# Patient Record
Sex: Female | Born: 1960 | Race: White | Hispanic: No | Smoking: Former smoker
Health system: Southern US, Community
[De-identification: ages and names within clinical notes are randomized; demographics above are authoritative.]

## PROBLEM LIST (undated history)

## (undated) DIAGNOSIS — K219 Gastro-esophageal reflux disease without esophagitis: Secondary | ICD-10-CM

## (undated) DIAGNOSIS — J449 Chronic obstructive pulmonary disease, unspecified: Secondary | ICD-10-CM

## (undated) DIAGNOSIS — R002 Palpitations: Secondary | ICD-10-CM

## (undated) DIAGNOSIS — I499 Cardiac arrhythmia, unspecified: Secondary | ICD-10-CM

## (undated) DIAGNOSIS — E119 Type 2 diabetes mellitus without complications: Secondary | ICD-10-CM

## (undated) DIAGNOSIS — E785 Hyperlipidemia, unspecified: Secondary | ICD-10-CM

## (undated) DIAGNOSIS — I2510 Atherosclerotic heart disease of native coronary artery without angina pectoris: Secondary | ICD-10-CM

## (undated) DIAGNOSIS — I10 Essential (primary) hypertension: Secondary | ICD-10-CM

## (undated) DIAGNOSIS — I209 Angina pectoris, unspecified: Secondary | ICD-10-CM

## (undated) DIAGNOSIS — Z8249 Family history of ischemic heart disease and other diseases of the circulatory system: Secondary | ICD-10-CM

## (undated) DIAGNOSIS — I119 Hypertensive heart disease without heart failure: Secondary | ICD-10-CM

## (undated) DIAGNOSIS — G4730 Sleep apnea, unspecified: Secondary | ICD-10-CM

## (undated) DIAGNOSIS — IMO0001 Reserved for inherently not codable concepts without codable children: Secondary | ICD-10-CM

## (undated) DIAGNOSIS — N189 Chronic kidney disease, unspecified: Secondary | ICD-10-CM

## (undated) DIAGNOSIS — Z720 Tobacco use: Secondary | ICD-10-CM

## (undated) DIAGNOSIS — Z86010 Personal history of colonic polyps: Secondary | ICD-10-CM

## (undated) HISTORY — DX: Hypertensive heart disease without heart failure: I11.9

## (undated) HISTORY — DX: Type 2 diabetes mellitus without complications: E11.9

## (undated) HISTORY — PX: COLONOSCOPY: SHX174

## (undated) HISTORY — DX: Sleep apnea, unspecified: G47.30

## (undated) HISTORY — DX: Chronic kidney disease, unspecified: N18.9

## (undated) HISTORY — DX: Palpitations: R00.2

## (undated) HISTORY — DX: Chronic obstructive pulmonary disease, unspecified: J44.9

## (undated) HISTORY — PX: POLYPECTOMY: SHX149

## (undated) HISTORY — PX: WRIST FRACTURE SURGERY: SHX121

## (undated) HISTORY — DX: Hyperlipidemia, unspecified: E78.5

## (undated) HISTORY — DX: Personal history of colonic polyps: Z86.010

---
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
## 1999-07-23 ENCOUNTER — Encounter: Admission: RE | Admit: 1999-07-23 | Discharge: 1999-07-23 | Payer: Self-pay | Admitting: Gastroenterology

## 1999-07-23 ENCOUNTER — Encounter: Payer: Self-pay | Admitting: Gastroenterology

## 1999-07-30 ENCOUNTER — Encounter: Payer: Self-pay | Admitting: Gastroenterology

## 1999-07-30 ENCOUNTER — Encounter: Admission: RE | Admit: 1999-07-30 | Discharge: 1999-07-30 | Payer: Self-pay | Admitting: Gastroenterology

## 2000-04-18 ENCOUNTER — Encounter: Payer: Self-pay | Admitting: Gastroenterology

## 2000-04-18 ENCOUNTER — Encounter: Admission: RE | Admit: 2000-04-18 | Discharge: 2000-04-18 | Payer: Self-pay | Admitting: Gastroenterology

## 2008-01-24 ENCOUNTER — Inpatient Hospital Stay (HOSPITAL_COMMUNITY): Admission: EM | Admit: 2008-01-24 | Discharge: 2008-01-27 | Payer: Self-pay | Admitting: Emergency Medicine

## 2008-01-25 ENCOUNTER — Encounter (INDEPENDENT_AMBULATORY_CARE_PROVIDER_SITE_OTHER): Payer: Self-pay | Admitting: Internal Medicine

## 2008-01-25 HISTORY — PX: TRANSTHORACIC ECHOCARDIOGRAM: SHX275

## 2008-02-06 ENCOUNTER — Inpatient Hospital Stay (HOSPITAL_COMMUNITY): Admission: EM | Admit: 2008-02-06 | Discharge: 2008-02-08 | Payer: Self-pay | Admitting: Emergency Medicine

## 2008-02-07 HISTORY — PX: CARDIAC CATHETERIZATION: SHX172

## 2008-05-03 ENCOUNTER — Encounter: Admission: RE | Admit: 2008-05-03 | Discharge: 2008-05-03 | Payer: Self-pay | Admitting: Emergency Medicine

## 2009-06-05 HISTORY — PX: OTHER SURGICAL HISTORY: SHX169

## 2010-04-27 LAB — DIFFERENTIAL
Basophils Absolute: 0 10*3/uL (ref 0.0–0.1)
Basophils Absolute: 0 10*3/uL (ref 0.0–0.1)
Basophils Relative: 1 % (ref 0–1)
Basophils Relative: 1 % (ref 0–1)
Eosinophils Absolute: 0.4 10*3/uL (ref 0.0–0.7)
Eosinophils Absolute: 0.2 10*3/uL (ref 0.0–0.7)
Monocytes Absolute: 0.5 10*3/uL (ref 0.1–1.0)
Monocytes Relative: 6 % (ref 3–12)
Neutro Abs: 4.6 10*3/uL (ref 1.7–7.7)
Neutro Abs: 5.2 10*3/uL (ref 1.7–7.7)
Neutrophils Relative %: 64 % (ref 43–77)

## 2010-04-27 LAB — CBC
HCT: 36.7 % (ref 36.0–46.0)
HCT: 34.2 % — ABNORMAL LOW (ref 36.0–46.0)
HCT: 37.3 % (ref 36.0–46.0)
Hemoglobin: 12.1 g/dL (ref 12.0–15.0)
Hemoglobin: 12.7 g/dL (ref 12.0–15.0)
MCHC: 32.3 g/dL (ref 30.0–36.0)
MCHC: 32.9 g/dL (ref 30.0–36.0)
MCHC: 32.3 g/dL (ref 30.0–36.0)
MCHC: 32.4 g/dL (ref 30.0–36.0)
MCHC: 33.6 g/dL (ref 30.0–36.0)
MCV: 81.8 fL (ref 78.0–100.0)
MCV: 83.2 fL (ref 78.0–100.0)
MCV: 81.2 fL (ref 78.0–100.0)
MCV: 83.1 fL (ref 78.0–100.0)
Platelets: 230 10*3/uL (ref 150–400)
Platelets: 196 10*3/uL (ref 150–400)
Platelets: 209 10*3/uL (ref 150–400)
RBC: 4.48 MIL/uL (ref 3.87–5.11)
RBC: 4.5 MIL/uL (ref 3.87–5.11)
RBC: 4.74 MIL/uL (ref 3.87–5.11)
RDW: 14.3 % (ref 11.5–15.5)
RDW: 14.4 % (ref 11.5–15.5)
RDW: 14.8 % (ref 11.5–15.5)
WBC: 8 10*3/uL (ref 4.0–10.5)
WBC: 8.5 10*3/uL (ref 4.0–10.5)

## 2010-04-27 LAB — BASIC METABOLIC PANEL
BUN: 18 mg/dL (ref 6–23)
BUN: 23 mg/dL (ref 6–23)
BUN: 33 mg/dL — ABNORMAL HIGH (ref 6–23)
CO2: 24 mEq/L (ref 19–32)
Calcium: 8.8 mg/dL (ref 8.4–10.5)
Chloride: 108 mEq/L (ref 96–112)
Creatinine, Ser: 0.93 mg/dL (ref 0.4–1.2)
GFR calc Af Amer: >60 mL/min (ref >60)
GFR calc non Af Amer: 41 mL/min — ABNORMAL LOW (ref >60)
GFR calc non Af Amer: 50 mL/min — ABNORMAL LOW (ref >60)
Glucose, Bld: 87 mg/dL (ref 70–99)
Potassium: 3.8 mEq/L (ref 3.5–5.1)
Potassium: 4 mEq/L (ref 3.5–5.1)
Sodium: 137 mEq/L (ref 135–145)

## 2010-04-27 LAB — COMPREHENSIVE METABOLIC PANEL
ALT: 21 U/L (ref 0–35)
ALT: 12 U/L (ref 0–35)
Alkaline Phosphatase: 42 U/L (ref 39–117)
Alkaline Phosphatase: 39 U/L (ref 39–117)
BUN: 18 mg/dL (ref 6–23)
BUN: 28 mg/dL — ABNORMAL HIGH (ref 6–23)
CO2: 25 mEq/L (ref 19–32)
CO2: 24 mEq/L (ref 19–32)
Chloride: 103 mEq/L (ref 96–112)
Chloride: 106 mEq/L (ref 96–112)
Creatinine, Ser: 0.93 mg/dL (ref 0.4–1.2)
GFR calc Af Amer: >60 mL/min (ref >60)
GFR calc non Af Amer: 47 mL/min — ABNORMAL LOW (ref >60)
Glucose, Bld: 109 mg/dL — ABNORMAL HIGH (ref 70–99)
Glucose, Bld: 101 mg/dL — ABNORMAL HIGH (ref 70–99)
Potassium: 4 mEq/L (ref 3.5–5.1)
Sodium: 139 mEq/L (ref 135–145)
Total Bilirubin: 0.6 mg/dL (ref 0.3–1.2)

## 2010-04-27 LAB — HEPATIC FUNCTION PANEL
ALT: 21 U/L (ref 0–35)
AST: 20 U/L (ref 0–37)
Albumin: 3.1 g/dL — ABNORMAL LOW (ref 3.5–5.2)
Bilirubin, Direct: <0.1 mg/dL (ref 0.0–0.3)

## 2010-04-27 LAB — HEMOGLOBIN A1C: Mean Plasma Glucose: 117 mg/dL

## 2010-04-27 LAB — RAPID URINE DRUG SCREEN, HOSP PERFORMED
Amphetamines: NOT DETECTED
Benzodiazepines: NOT DETECTED
Tetrahydrocannabinol: NOT DETECTED

## 2010-04-27 LAB — CK TOTAL AND CKMB (NOT AT ARMC)
CK, MB: 1.6 ng/mL (ref 0.3–4.0)
CK, MB: 2 ng/mL (ref 0.3–4.0)
CK, MB: 1.2 ng/mL (ref 0.3–4.0)
Relative Index: 1.4 (ref 0.0–2.5)
Total CK: 115 U/L (ref 7–177)
Total CK: 135 U/L (ref 7–177)

## 2010-04-27 LAB — SEDIMENTATION RATE: Sed Rate: 8 mm/hr (ref 0–22)

## 2010-04-27 LAB — LIPID PANEL
HDL: 22 mg/dL — ABNORMAL LOW (ref >39)
LDL Cholesterol: 100 mg/dL — ABNORMAL HIGH (ref 0–99)
Total CHOL/HDL Ratio: 7.7 RATIO
VLDL: 47 mg/dL — ABNORMAL HIGH (ref 0–40)

## 2010-04-27 LAB — TSH: TSH: 1.636 u[IU]/mL (ref 0.350–4.500)

## 2010-04-27 LAB — CARDIAC PANEL(CRET KIN+CKTOT+MB+TROPI)
CK, MB: 1 ng/mL (ref 0.3–4.0)
Total CK: 102 U/L (ref 7–177)
Total CK: 88 U/L (ref 7–177)
Total CK: 93 U/L (ref 7–177)
Troponin I: <0.01 ng/mL (ref 0.00–0.06)
Troponin I: <0.01 ng/mL (ref 0.00–0.06)

## 2010-04-27 LAB — MAGNESIUM: Magnesium: 2.5 mg/dL (ref 1.5–2.5)

## 2010-04-27 LAB — TROPONIN I: Troponin I: <0.01 ng/mL (ref 0.00–0.06)

## 2010-05-26 NOTE — Discharge Summary (Signed)
[name], [name]             ACCOUNT NO.:  [id number]   MEDICAL RECORD NO.:  [id number]          PATIENT TYPE:  INP   LOCATION:  4729                         FACILITY:  [facility]   PHYSICIAN:  Durwin Nora, MDDATE OF BIRTH:  1960-09-24   DATE OF ADMISSION:  01/24/2008  DATE OF DISCHARGE:  01/27/2008                               DISCHARGE SUMMARY   DISCHARGE DIAGNOSES:  1. Chest pain atypical acute coronary syndrome less likely.  2. Small benign granulomas, right lung.  3. Renal cyst.  4. Severe hypertension, improved.  5. Morbid obesity.  6. Dyslipidemia.  7. Tobacco abuse.   RADIOLOGY:  The CT chest on January 25, 2008 shows a benign appearing  chest, which incidentally shows renal cyst of both kidneys.  Chest x-ray  on January 24, 2008 showed no acute cardiopulmonary findings, small  right upper lobe pulmonary nodule.   HOSPITAL COURSE:  This 50 year old lady presented with substernal chest  pressure and elevated blood pressure.  Systolic blood pressure was  greater than 200 at presentation.  EKG did show some T-wave  abnormalities however, serial cardiac enzymes on EKG have been negative  on this admission.  Lipid panel did show hypercholesterolemia with  triclyceride level 237, HDL level 22 and LDL level of 100.  The patient  has been started on statins.  LFTs are seen normal.  The blood pressure  control has been improved on ACE inhibitor, beta-blocker and  continuation of a diuretic.   DISCHARGE CONDITION:  Stable.   DIET:  Heart-healthy, low-cholesterol.   ACTIVITY:  Activity increased slowly as tolerated.   FOLLOWUP:  The patient to report back to the emergency if she develops  any sudden chest pain, shortness of breath, excessive weakness or  dizziness.   DISCHARGE MEDICATIONS:  1. Maxzide 25 mg daily.  2. Enteric coated aspirin 81 mg daily.  3. Amlodipine 10 mg daily.  4. Lisinopril 10 mg daily.  5. The patient is to hold blood pressure  medication if blood pressure      is less than systolic [id number], diastolic 60.  6. Metoprolol 50 mg twice daily, hold if heart rate is less than 60.  7. Nitroglycerin 0.4 mg sublingual every 5 minutes x3 as need for      chest pain.  8. Nicotine patch 21 mg per day.   PHYSICAL EXAMINATION:  GENERAL:  On examination today, she is an elderly  lady, not in acute distress.  VITAL SIGNS:  Temperature 97, pulse 82, respiratory rate is 20, and  blood pressure 150/90.  HEENT:  Head is atraumatic, normocephalic.  Oropharynx and nasopharynx  are clear.  Mucous membranes are moist.  NECK:  Supple.  There is no submandibular lymphadenopathy.  No carotid  bruit or thyromegaly.  CHEST:  Clinically clear.  HEART:  Heart sounds 1 and 2, regular.  No rubs or murmurs.  ABDOMEN:  Benign.  CENTRAL NERVOUS SYSTEM:  No deficits.  Peripheral pulses present.  No  pedal edema.   LABORATORY DATA:  Chemistry:  Sodium is 136, potassium 48.5, chloride  103, bicarbonate 25, glucose 109, BUN 16, and creatinine  0.9.  Complete  blood count shows WBC is 8.5, hematocrit is 36, platelet count is 230.  A 2-D echocardiogram on January 25, 2008 shows a normal left ventricular  ejection fraction ranging between 60-65%.  There was no left ventricular  regional wall motion abnormalities.  The left ventricular wall thickness  was mild to moderately increased in the concentric hypertrophy.  Features were consistent with this pseudomonal left ventricular film  pattern with concomitant abnormal relaxation and increased film  pressure.  Left atrium was also mildly dilated.   The patient's illness, medication, test, treatment, and discharge plan  were explained to her.  Tobacco cessation was reinforced.  Discharge  time greater than 30 minutes.   She is to follow up with the primary care physician Dr. Joseph Art at  the Urgent Care in the next 5-7 days.  He will arrange Cardiology  followup and possible stress echo as outpatient  as needed.      Durwin Nora, MD  Electronically Signed     MIO/MEDQ  D:  01/27/2008  T:  01/27/2008  Job:  [id number]

## 2010-05-26 NOTE — Discharge Summary (Signed)
[name]             ACCOUNT NO.:  [id number]   MEDICAL RECORD NO.:  [id number]          PATIENT TYPE:  INP   LOCATION:  [id number]                         FACILITY:  [facility]   PHYSICIAN:  Tery Sanfilippo, MD     DATE OF BIRTH:  Jan 30, 1960   DATE OF ADMISSION:  02/06/2008  DATE OF DISCHARGE:  02/08/2008                               DISCHARGE SUMMARY   DISCHARGE DIAGNOSES:  1. Chest pain worrisome for unstable angina, catheterization this      admission revealing moderate left anterior descending disease with      a 60-70% mid lesion, 50% mid circumflex and 50% right coronary      artery and 70% distal right coronary artery, plan is for medical      therapy.  2. Treated hypertension.  3. Treated dyslipidemia.  4. History of smoking.   HOSPITAL COURSE:  The patient is a 50 year old female who was sent to  [facility] ER from an Urgent Care with chest pain.  She has multiple risk  factors for coronary artery disease.  Her symptoms were worrisome for  unstable angina.  She was seen by Dr. Elisabeth Cara on admission.  Please see  admission history and physical for complete details.  She did have an  abnormal EKG with bradycardia and nonspecific ST changes.  The patient  was admitted to telemetry and set up for diagnostic catheterization.  This revealed moderate disease as described above.  Plan is for  continued medical therapy.  She will need an outpatient Myoview which  has been scheduled for February 19, 2008 at 8:15.  Dr. Elisabeth Cara feels she  can go back to work on February 12, 2008.   MEDICATIONS AT DISCHARGE:  1. Maxzide 12.5 mg a day.  2. Aspirin 81 mg a day.  3. Amlodipine 10 mg a day.  4. Lisinopril 20 mg at bedtime.  5. Metoprolol 25 mg b.i.d.  6. Nitroglycerin sublingual p.r.n.  7. Zocor 40 mg a day.   LABORATORY DATA:  White count 9.1, hemoglobin 11.5, hematocrit 34.2,  platelets 196.  Sodium 139, potassium 4.0, BUN 23, creatinine 1.16,  troponins are negative.  TSH is 1.63.   Lipids are pending.   DISPOSITION:  The patient is discharged in stable condition and will  follow up with Dr. Elisabeth Cara after her Myoview.      Erlene Quan, P.A.      Tery Sanfilippo, MD  Electronically Signed    LKK/MEDQ  D:  02/08/2008  T:  02/08/2008  Job:  [id number]

## 2010-05-26 NOTE — H&P (Signed)
[name], [name]             ACCOUNT NO.:  [id number]   MEDICAL RECORD NO.:  [id number]          PATIENT TYPE:  EMS   LOCATION:  MAJO                         FACILITY:  [place]   PHYSICIAN:  Rise Patience, MDDATE OF BIRTH:  Apr 07, 1960   DATE OF ADMISSION:  01/24/2008  DATE OF DISCHARGE:                              HISTORY & PHYSICAL   PRIMARY CARE PHYSICIAN:  Dr. Joseph Art.   CHIEF COMPLAINT:  Chest pain, elevated blood pressure.   HISTORY OF PRESENT ILLNESS:  A 50 year old female with a history of  hypertension checked her blood pressure in [employer] and outpatient  clinic, where she was found to a blood pressure of more than 200.  In  addition, the patient also had mild chest discomfort which happened  twice today, which lasted for a few seconds, pinprick sensation,  retrosternal, non-radiating, not related to exertion.  Denies any  associated shortness of breath, abdominal pain, nausea, vomiting,  palpitations, dizziness, weakness of limbs of diaphoresis.  The patient  in the ER was found to have a high blood pressure and has been admitted.  The patient's EKG showed some lateral wall changes.  For further  observation and rule out ACS and control her blood pressure.  The  patient presented to the ED asymptomatic.   PAST MEDICAL HISTORY:  Hypertension.   PAST SURGICAL HISTORY:  Right wrist surgery for fracture.   MEDICATIONS PRIOR TO ADMISSION:  Maxzide 25 mg p.o. daily.   ALLERGIES:  NO KNOWN DRUG ALLERGIES.   SOCIAL HISTORY:  The patient smokes cigarettes and has been strongly  encouraged to quit smoking.  Denies alcohol or drug abuse.   FAMILY HISTORY:  Significant for patient's dying of MI at age 6.   REVIEW OF SYSTEMS:  As per the history of present illness, nothing else  significant.   PHYSICAL EXAMINATION:  GENERAL:  Patient examined at bedside, not in  acute distress.  Denies any chest pain now.  VITAL SIGNS:  Blood pressure 180/90, pulse 70 per  minute, temperature  98.6, respirations 18 per minute.  O2 sat 100%.  HEENT:  Anicteric, no pallor.  CHEST:  Bilateral air entry present.  No rhonchi, no crepitation.  HEART:  S1-S2 heard.  ABDOMEN:  Soft, nontender.  Bowel sounds heard.  CNS:  Alert, awake, oriented to time, place and person.  Moves upper and  lower extremities 5/5.  EXTREMITIES:  Peripheral pulses felt.  No edema.   LABORATORY DATA:  EKG:  Normal sinus rhythm with T-wave changes in the  lateral leads.  Chest x-ray:  Shows no acute cardiopulmonary findings.  Small right upper pulmonary nodule.  CBC - WBC 8, hemoglobin 12.1,  hematocrit 37.4, platelets 230, neutrophils 57%.  Basic metabolic panel;  sodium [id number], potassium 3.9, chloride 108, carbon dioxide 26, glucose 91,  BUN 18, creatinine 0.8, calcium 8.8, CK 135, troponin-I 0.01.  BNP 32.  Drug screen negative.   ASSESSMENT:  1. Atypical chest pain, to rule out acute coronary syndrome.  2. Accelerated hypertension.  3. Pulmonary nodule.  4. Ongoing tobacco abuse.   PLAN:  Admit patient to telemetry.  Cycle cardiac markers.  Will add  Norvasc and beta-blockers.  Place patient on aspirin and will get a 2-D  echo.  Further recommendations as condition evolves.  The patient  advised that she will need further workup on her pulmonary nodule,  including an outpatient CAT scan and further follow up with chest x-ray  through her primary care physician.      Rise Patience, MD  Electronically Signed     ANK/MEDQ  D:  01/24/2008  T:  01/24/2008  Job:  [phone]

## 2011-03-18 ENCOUNTER — Other Ambulatory Visit (HOSPITAL_COMMUNITY): Payer: Self-pay | Admitting: Cardiology

## 2011-03-18 DIAGNOSIS — I2510 Atherosclerotic heart disease of native coronary artery without angina pectoris: Secondary | ICD-10-CM

## 2011-03-18 DIAGNOSIS — I10 Essential (primary) hypertension: Secondary | ICD-10-CM

## 2011-03-25 ENCOUNTER — Ambulatory Visit (HOSPITAL_COMMUNITY)
Admission: RE | Admit: 2011-03-25 | Discharge: 2011-03-25 | Disposition: A | Discharge status: Home / Self Care | Payer: Self-pay | Source: Ambulatory Visit | Attending: Cardiology | Admitting: Cardiology

## 2011-03-25 ENCOUNTER — Encounter (HOSPITAL_COMMUNITY)
Admission: RE | Admit: 2011-03-25 | Discharge: 2011-03-25 | Disposition: A | Discharge status: Home / Self Care | Payer: Self-pay | Source: Ambulatory Visit | Attending: Cardiology | Admitting: Cardiology

## 2011-03-25 DIAGNOSIS — R079 Chest pain, unspecified: Secondary | ICD-10-CM | POA: Insufficient documentation

## 2011-03-25 DIAGNOSIS — I10 Essential (primary) hypertension: Secondary | ICD-10-CM | POA: Insufficient documentation

## 2011-03-25 DIAGNOSIS — I2510 Atherosclerotic heart disease of native coronary artery without angina pectoris: Secondary | ICD-10-CM | POA: Insufficient documentation

## 2011-03-25 HISTORY — PX: CARDIOVASCULAR STRESS TEST: SHX262

## 2011-03-25 MED ORDER — REGADENOSON 0.4 MG/5ML IV SOLN
INTRAVENOUS | Status: AC
  Administered 2011-03-25: 0.4 mg
  Filled 2011-03-25: qty 5

## 2011-03-25 MED ORDER — TECHNETIUM TC 99M TETROFOSMIN IV KIT
30.0000 | PACK | Freq: Once | INTRAVENOUS | Status: AC | PRN
  Administered 2011-03-25: 30 via INTRAVENOUS

## 2011-03-25 MED ORDER — TECHNETIUM TC 99M TETROFOSMIN IV KIT
10.0000 | PACK | Freq: Once | INTRAVENOUS | Status: AC | PRN
  Administered 2011-03-25: 10 via INTRAVENOUS

## 2011-04-15 ENCOUNTER — Observation Stay (HOSPITAL_COMMUNITY)
Admission: AD | Admit: 2011-04-15 | Discharge: 2011-04-17 | Disposition: A | Discharge status: Home / Self Care | Payer: Medicaid Other | Source: Ambulatory Visit | Attending: Cardiovascular Disease | Admitting: Cardiovascular Disease

## 2011-04-15 ENCOUNTER — Encounter (HOSPITAL_COMMUNITY): Payer: Self-pay | Admitting: *Deleted

## 2011-04-15 ENCOUNTER — Other Ambulatory Visit: Payer: Self-pay

## 2011-04-15 DIAGNOSIS — Z720 Tobacco use: Secondary | ICD-10-CM | POA: Diagnosis present

## 2011-04-15 DIAGNOSIS — E785 Hyperlipidemia, unspecified: Secondary | ICD-10-CM | POA: Diagnosis present

## 2011-04-15 DIAGNOSIS — I2510 Atherosclerotic heart disease of native coronary artery without angina pectoris: Secondary | ICD-10-CM | POA: Insufficient documentation

## 2011-04-15 DIAGNOSIS — R079 Chest pain, unspecified: Principal | ICD-10-CM | POA: Insufficient documentation

## 2011-04-15 DIAGNOSIS — I10 Essential (primary) hypertension: Secondary | ICD-10-CM | POA: Diagnosis present

## 2011-04-15 DIAGNOSIS — Z8249 Family history of ischemic heart disease and other diseases of the circulatory system: Secondary | ICD-10-CM

## 2011-04-15 DIAGNOSIS — E8881 Metabolic syndrome: Secondary | ICD-10-CM | POA: Diagnosis present

## 2011-04-15 DIAGNOSIS — R9439 Abnormal result of other cardiovascular function study: Secondary | ICD-10-CM | POA: Diagnosis present

## 2011-04-15 HISTORY — DX: Essential (primary) hypertension: I10

## 2011-04-15 HISTORY — DX: Tobacco use: Z72.0

## 2011-04-15 HISTORY — DX: Angina pectoris, unspecified: I20.9

## 2011-04-15 HISTORY — DX: Family history of ischemic heart disease and other diseases of the circulatory system: Z82.49

## 2011-04-15 HISTORY — DX: Atherosclerotic heart disease of native coronary artery without angina pectoris: I25.10

## 2011-04-15 LAB — CBC
HCT: 38.9 % (ref 36.0–46.0)
Hemoglobin: 12.8 g/dL (ref 12.0–15.0)
RBC: 4.74 MIL/uL (ref 3.87–5.11)
WBC: 9.9 10*3/uL (ref 4.0–10.5)

## 2011-04-15 LAB — HEMOGLOBIN A1C
Hgb A1c MFr Bld: 6.1 % — ABNORMAL HIGH (ref <5.7)
Mean Plasma Glucose: 128 mg/dL — ABNORMAL HIGH (ref <117)

## 2011-04-15 LAB — BASIC METABOLIC PANEL
GFR calc Af Amer: 78 mL/min — ABNORMAL LOW (ref >90)
GFR calc non Af Amer: 67 mL/min — ABNORMAL LOW (ref >90)
Glucose, Bld: 135 mg/dL — ABNORMAL HIGH (ref 70–99)
Potassium: 3.9 mEq/L (ref 3.5–5.1)
Sodium: 136 mEq/L (ref 135–145)

## 2011-04-15 LAB — CARDIAC PANEL(CRET KIN+CKTOT+MB+TROPI)
Relative Index: INVALID (ref 0.0–2.5)
Total CK: 99 U/L (ref 7–177)

## 2011-04-15 LAB — PROTIME-INR: Prothrombin Time: 13.9 seconds (ref 11.6–15.2)

## 2011-04-15 LAB — TSH: TSH: 1.405 u[IU]/mL (ref 0.350–4.500)

## 2011-04-15 MED ORDER — HEPARIN BOLUS VIA INFUSION
4000.0000 [IU] | Freq: Once | INTRAVENOUS | Status: AC
  Administered 2011-04-15: 4000 [IU] via INTRAVENOUS
  Filled 2011-04-15: qty 4000

## 2011-04-15 MED ORDER — ASPIRIN 81 MG PO CHEW: 324.0000 mg | CHEWABLE_TABLET | ORAL | Status: AC

## 2011-04-15 MED ORDER — ASPIRIN 300 MG RE SUPP
300.0000 mg | RECTAL | Status: AC
  Filled 2011-04-15: qty 1

## 2011-04-15 MED ORDER — SODIUM CHLORIDE 0.9 % IJ SOLN: 3.0000 mL | INTRAMUSCULAR | Status: DC | PRN

## 2011-04-15 MED ORDER — SODIUM CHLORIDE 0.9 % IJ SOLN
3.0000 mL | Freq: Two times a day (BID) | INTRAMUSCULAR | Status: DC
  Administered 2011-04-16: 3 mL via INTRAVENOUS

## 2011-04-15 MED ORDER — SODIUM CHLORIDE 0.9 % IV SOLN
250.0000 mL | INTRAVENOUS | Status: DC | PRN
  Administered 2011-04-15: 250 mL via INTRAVENOUS

## 2011-04-15 MED ORDER — ACETAMINOPHEN 325 MG PO TABS: 650.0000 mg | ORAL_TABLET | ORAL | Status: DC | PRN

## 2011-04-15 MED ORDER — AMLODIPINE BESYLATE 10 MG PO TABS
10.0000 mg | ORAL_TABLET | Freq: Every day | ORAL | Status: DC
  Administered 2011-04-16: 10 mg via ORAL
  Filled 2011-04-15 (×2): qty 1

## 2011-04-15 MED ORDER — LISINOPRIL 20 MG PO TABS
20.0000 mg | ORAL_TABLET | Freq: Two times a day (BID) | ORAL | Status: DC
  Administered 2011-04-15 – 2011-04-17 (×4): 20 mg via ORAL
  Filled 2011-04-15 (×5): qty 1

## 2011-04-15 MED ORDER — ASPIRIN 81 MG PO CHEW
324.0000 mg | CHEWABLE_TABLET | ORAL | Status: AC
  Administered 2011-04-16: 324 mg via ORAL
  Filled 2011-04-15: qty 4

## 2011-04-15 MED ORDER — NITROGLYCERIN IN D5W 200-5 MCG/ML-% IV SOLN
5.0000 ug/min | INTRAVENOUS | Status: DC
  Administered 2011-04-15: 5 ug/min via INTRAVENOUS
  Filled 2011-04-15: qty 250

## 2011-04-15 MED ORDER — SODIUM CHLORIDE 0.9 % IV SOLN
INTRAVENOUS | Status: DC
  Administered 2011-04-15: 21:00:00 via INTRAVENOUS

## 2011-04-15 MED ORDER — ASPIRIN EC 81 MG PO TBEC
81.0000 mg | DELAYED_RELEASE_TABLET | Freq: Every day | ORAL | Status: DC
  Administered 2011-04-17: 81 mg via ORAL
  Filled 2011-04-15: qty 1

## 2011-04-15 MED ORDER — NEBIVOLOL HCL 10 MG PO TABS
10.0000 mg | ORAL_TABLET | Freq: Two times a day (BID) | ORAL | Status: DC
  Administered 2011-04-15 – 2011-04-17 (×4): 10 mg via ORAL
  Filled 2011-04-15 (×5): qty 1

## 2011-04-15 MED ORDER — ATORVASTATIN CALCIUM 10 MG PO TABS
10.0000 mg | ORAL_TABLET | Freq: Every day | ORAL | Status: DC
  Administered 2011-04-15: 10 mg via ORAL
  Filled 2011-04-15 (×2): qty 1

## 2011-04-15 MED ORDER — ONDANSETRON HCL 4 MG/2ML IJ SOLN: 4.0000 mg | Freq: Four times a day (QID) | INTRAMUSCULAR | Status: DC | PRN

## 2011-04-15 MED ORDER — HEPARIN (PORCINE) IN NACL 100-0.45 UNIT/ML-% IJ SOLN
1300.0000 [IU]/h | INTRAMUSCULAR | Status: DC
  Administered 2011-04-15: 1000 [IU]/h via INTRAVENOUS
  Filled 2011-04-15 (×2): qty 250

## 2011-04-15 MED ORDER — NITROGLYCERIN 0.4 MG SL SUBL: 0.4000 mg | SUBLINGUAL_TABLET | SUBLINGUAL | Status: DC | PRN

## 2011-04-15 NOTE — Progress Notes (Signed)
Patient refused having bed alarm placed. Reviewed safety plan and patient understood. [name]

## 2011-04-15 NOTE — Progress Notes (Addendum)
ANTICOAGULATION CONSULT NOTE - Initial Consult  Pharmacy Consult for Heparin Indication: chest pain/ACS  Allergies  Allergen Reactions  . Wellbutrin (Bupropion Hcl)     unknown    Patient Measurements: Height: 5\' 4"  (162.6 cm) Weight: 212 lb (96.163 kg) IBW/kg (Calculated) : 54.7  Heparin Dosing Weight: 78 kg  Vital Signs: Temp: 96.9 F (36.1 C) (04/04 1645) Temp src: Oral (04/04 1645) BP: 168/81 mmHg (04/04 1645) Pulse Rate: 64  (04/04 1645)  Labs: No results found for this basename: HGB:2,HCT:3,PLT:3,APTT:3,LABPROT:3,INR:3,HEPARINUNFRC:3,CREATININE:3,CKTOTAL:3,CKMB:3,TROPONINI:3 in the last 72 hours Estimated Creatinine Clearance: 65.6 ml/min (by C-G formula based on Cr of 1.18).  Medical History: Past Medical History  Diagnosis Date  . Angina   . Hypertension     Medications:  Scheduled:    . amLODipine  10 mg Oral QHS  . aspirin  324 mg Oral NOW   Or  . aspirin  300 mg Rectal NOW  . aspirin  324 mg Oral Pre-Cath  . aspirin EC  81 mg Oral Daily  . atorvastatin  10 mg Oral q1800  . nebivolol  10 mg Oral BID  . sodium chloride  3 mL Intravenous Q12H    Assessment: Pt was directly admitted from MD office today for cardiac cath on 04/16/11.  No hx bleeding noted. Pltc 204 at baseline.   Goal of Therapy:  Heparin level 0.3-0.7 units/ml   Plan:  1) Heparin 4000 units IV bolus x1, then heparin infusion at 1000 units/hr.   2) Check 6hr heparin level. 3) daily heparin level & CBC while on heparin. Routine platelet monitoring per protocol.  Biagio Borg 04/15/2011,5:22 PM

## 2011-04-15 NOTE — H&P (Signed)
[name] is an 51 y.o. female.   Chief Complaint: chest pain HPI: 51 year old white female with history of cardiac cath by Dr. Elisabeth Cara in 2010 with 60% LAD stenosis, 50 % RCA stenosis mid vessel and 70% distal RCA, with normal LV function.  She was seen in the office 03/18/2011 for chest pain that occurred mostly at rest. She was hypertensive and medications were adjusted and she was scheduled for a lexiscan myoview.  The myoview was suspicious for ant. Ischemia.  She presented today for follow up of results.  She complained of chest pressure 7/10, associated with nausea and SOB.  EKG with deeper t-wave inversion I, and AVL.  1 NTG was given with resolution of chest pain.  Reviewed EKG and myoview results with Dr. Sallyanne Kuster, it was felt she should be admitted to tele and undergo cardiac cath in the AM.  Pt agreeable.  We offered to call EMS for pt, and we attempted to find her family to drive her to the hospital, but with resolution of her symptoms she preferred to drive herself.        Past Medical History  Diagnosis Date  . Angina   . Hypertension   . Unstable angina 04/15/2011  . CAD (coronary artery disease), non obstructive CAD in 2010 04/15/2011  . Abnormal stress test, Lexiscan myoview 03/25/2011-sm. anterior ischemia 04/15/2011  . HTN (hypertension) 04/15/2011  . Dyslipidemia 04/15/2011  . Metabolic syndrome 99991111  . Tobacco abuse 04/15/2011  . Family history of premature CAD 04/15/2011    Past Surgical History  Procedure Date  . Appendectomy   . Cardiac catheterization 2010  . Appendectomy     Family History  Problem Relation Age of Onset  . Stroke Father   . Coronary artery disease Brother    Social History:  reports that she has been smoking Cigarettes.  She has a 30 pack-year smoking history. She does not have any smokeless tobacco history on file. She reports that she does not drink alcohol or use illicit drugs. Divorced, one 67 year old child.  She is interested in stopping  tobacco.    Allergies:  Allergies  Allergen Reactions  . Simvastatin     myalgias  . Wellbutrin (Bupropion Hcl)     unknown  . Zetia (Ezetimibe)     myalgias    Medications Prior to Admission  Medication Dose Route Frequency Provider Last Rate Last Dose  . 0.9 %  sodium chloride infusion  250 mL Intravenous PRN Brett Canales, PA      . 0.9 %  sodium chloride infusion   Intravenous Continuous Brett Canales, PA      . acetaminophen (TYLENOL) tablet 650 mg  650 mg Oral Q4H PRN Brett Canales, PA      . amLODipine (NORVASC) tablet 10 mg  10 mg Oral QHS Brett Canales, PA      . aspirin chewable tablet 324 mg  324 mg Oral NOW Brett Canales, PA       Or  . aspirin suppository 300 mg  300 mg Rectal NOW Brett Canales, PA      . aspirin chewable tablet 324 mg  324 mg Oral Pre-Cath Brett Canales, [place]      . aspirin EC tablet 81 mg  81 mg Oral Daily Brett Canales, [place]      . atorvastatin (LIPITOR) tablet 10 mg  10 mg Oral q1800 Brett Canales, PA      .  heparin ADULT infusion 100 units/mL (25000 units/250 mL)  1,000 Units/hr Intravenous Continuous Lavonia Drafts [place], PHARMD      . heparin bolus via infusion 4,000 Units  4,000 Units Intravenous Once Lavonia Drafts [place], PHARMD      . nebivolol (BYSTOLIC) tablet 10 mg  10 mg Oral BID Brett Canales, PA      . nitroGLYCERIN (NITROSTAT) SL tablet 0.4 mg  0.4 mg Sublingual Q5 Min x 3 PRN Brett Canales, PA      . nitroGLYCERIN 0.2 mg/mL in dextrose 5 % infusion  5-30 mcg/min Intravenous Titrated Brett Canales, PA      . ondansetron [facility]) injection 4 mg  4 mg Intravenous Q6H PRN Brett Canales, PA      . sodium chloride 0.9 % injection 3 mL  3 mL Intravenous Q12H Brett Canales, PA      . sodium chloride 0.9 % injection 3 mL  3 mL Intravenous PRN Brett Canales, PA       Medications Prior to Admission  Medication Sig Dispense Refill  . amLODipine (NORVASC) 10 MG tablet Take 10 mg by mouth at bedtime.      . nebivolol (BYSTOLIC) 10 MG tablet  Take 10 mg by mouth 2 (two) times daily.      . rosuvastatin (CRESTOR) 5 MG tablet Take 5 mg by mouth daily. Takes on Mon, Wed and fridays      we also have the patient taking maxide 37.5/25 1 daily Lisinopril 20 mg BID ASA 81 mg daily     ROS: General:no colds or fevers Skin:no rashes or ulcers HEENT:no visual changes [id number] pain PUL:occ. SOB with chest pain GI:no diarrhea or constipation GU:no hematuria MS:occ. Lt arm pain Neuro:no syncope, occ. dizziness Endo:metabolic syndrome with [id number] of 6.0    Blood pressure 168/81, pulse 64, temperature 96.9 F (36.1 C), temperature source Oral, resp. rate 16, height 5\' 4"  (1.626 m), weight 96.163 kg (212 lb), last menstrual period 02/26/2011, SpO2 98.00%. PE: General:alert and oriented X 3, pleasant affect, distress with chest pain but once resolved pt was in NAD Skin:w&d, brisk capillary refill. HEENT:normocephalic, sclera clear  Neck:supple, no JVD Heart:S1S2, RRR, no murmur, gallup rub or click Lungs:clear without rales, rhonchi or wheezes Abd:+ BS, soft, non tender Ext:no edema Neuro:alert and oriented X 3 MAE, follows commands.    Assessment/Plan Patient Active Problem List  Diagnoses  . Unstable angina  . CAD (coronary artery disease), non obstructive CAD in 2010  . Abnormal stress test, Lexiscan myoview 03/25/2011-sm. anterior ischemia  . HTN (hypertension)  . Dyslipidemia  . Metabolic syndrome  . Tobacco abuse  . Family history of premature CAD   PLAN: Admit to tele, IV NTG, IV heparin and cardiac cath in am.    INGOLD,LAURA R 04/15/2011, 5:53 PM  Agree with note written by Cecilie Kicks RNP  Pt seen in the office and sent to [facility] for admission for [place]. + CRF. Mod CAD by cath 2010. Recent minimally + myoview several weeks ago and accelerated angina. Currently pain free on iv hep and ntg. Exam behign. Labs OK. Ekg with lat TWI. Plan cardiac cath tomorrow. Pt agreeable.  Lorretta Harp 04/15/2011 6:44 PM

## 2011-04-16 ENCOUNTER — Encounter (HOSPITAL_COMMUNITY)
Admission: AD | Disposition: A | Discharge status: Home / Self Care | Payer: Self-pay | Source: Ambulatory Visit | Attending: Cardiovascular Disease

## 2011-04-16 ENCOUNTER — Other Ambulatory Visit: Payer: Self-pay

## 2011-04-16 ENCOUNTER — Ambulatory Visit (HOSPITAL_COMMUNITY): Admit: 2011-04-16 | Payer: Self-pay | Admitting: Cardiovascular Disease

## 2011-04-16 HISTORY — PX: CARDIAC CATHETERIZATION: SHX172

## 2011-04-16 HISTORY — PX: LEFT HEART CATHETERIZATION WITH CORONARY ANGIOGRAM: SHX5451

## 2011-04-16 LAB — LIPID PANEL
Cholesterol: 126 mg/dL (ref 0–200)
Total CHOL/HDL Ratio: 4.8 RATIO
Triglycerides: 191 mg/dL — ABNORMAL HIGH (ref <150)
VLDL: 38 mg/dL (ref 0–40)

## 2011-04-16 LAB — CBC
Platelets: 198 10*3/uL (ref 150–400)
RDW: 15.5 % (ref 11.5–15.5)
WBC: 10.3 10*3/uL (ref 4.0–10.5)

## 2011-04-16 LAB — BASIC METABOLIC PANEL
Chloride: 105 mEq/L (ref 96–112)
GFR calc Af Amer: 89 mL/min — ABNORMAL LOW (ref >90)
Potassium: 3.5 mEq/L (ref 3.5–5.1)

## 2011-04-16 LAB — CARDIAC PANEL(CRET KIN+CKTOT+MB+TROPI)
Relative Index: INVALID (ref 0.0–2.5)
Relative Index: INVALID (ref 0.0–2.5)
Total CK: 86 U/L (ref 7–177)

## 2011-04-16 SURGERY — LEFT HEART CATHETERIZATION WITH CORONARY ANGIOGRAM
Anesthesia: LOCAL

## 2011-04-16 MED ORDER — NITROGLYCERIN 0.2 MG/ML ON CALL CATH LAB
INTRAVENOUS | Status: AC
  Filled 2011-04-16: qty 1

## 2011-04-16 MED ORDER — MIDAZOLAM HCL 2 MG/2ML IJ SOLN
INTRAMUSCULAR | Status: AC
  Filled 2011-04-16: qty 2

## 2011-04-16 MED ORDER — ACETAMINOPHEN 325 MG PO TABS: 650.0000 mg | ORAL_TABLET | ORAL | Status: DC | PRN

## 2011-04-16 MED ORDER — SODIUM CHLORIDE 0.9 % IV SOLN
INTRAVENOUS | Status: DC
  Administered 2011-04-16: 14:00:00 via INTRAVENOUS

## 2011-04-16 MED ORDER — ISOSORBIDE MONONITRATE ER 30 MG PO TB24
30.0000 mg | ORAL_TABLET | Freq: Every day | ORAL | Status: DC
  Administered 2011-04-16 – 2011-04-17 (×2): 30 mg via ORAL
  Filled 2011-04-16 (×2): qty 1

## 2011-04-16 MED ORDER — ONDANSETRON HCL 4 MG/2ML IJ SOLN: 4.0000 mg | Freq: Four times a day (QID) | INTRAMUSCULAR | Status: DC | PRN

## 2011-04-16 MED ORDER — LIDOCAINE HCL (PF) 1 % IJ SOLN
INTRAMUSCULAR | Status: AC
  Filled 2011-04-16: qty 30

## 2011-04-16 MED ORDER — HEPARIN BOLUS VIA INFUSION
2000.0000 [IU] | Freq: Once | INTRAVENOUS | Status: AC
  Administered 2011-04-16: 2000 [IU] via INTRAVENOUS
  Filled 2011-04-16: qty 2000

## 2011-04-16 MED ORDER — ATORVASTATIN CALCIUM 40 MG PO TABS
40.0000 mg | ORAL_TABLET | Freq: Every day | ORAL | Status: DC
  Filled 2011-04-16 (×2): qty 1

## 2011-04-16 MED ORDER — FENTANYL CITRATE 0.05 MG/ML IJ SOLN
INTRAMUSCULAR | Status: AC
  Filled 2011-04-16: qty 2

## 2011-04-16 MED ORDER — HEPARIN (PORCINE) IN NACL 2-0.9 UNIT/ML-% IJ SOLN
INTRAMUSCULAR | Status: AC
  Filled 2011-04-16: qty 2000

## 2011-04-16 NOTE — Progress Notes (Signed)
ANTICOAGULATION CONSULT NOTE - Follow Up  Pharmacy Consult for Heparin Indication: chest pain/ACS  Allergies  Allergen Reactions  . Simvastatin     myalgias  . Wellbutrin (Bupropion Hcl)     unknown  . Zetia (Ezetimibe)     myalgias    Patient Measurements: Height: 5\' 4"  (162.6 cm) Weight: 212 lb (96.163 kg) IBW/kg (Calculated) : 54.7  Heparin Dosing Weight: 78 kg  Vital Signs: Temp: 97.6 F (36.4 C) (04/05 0524) Temp src: Oral (04/05 0524) BP: 144/76 mmHg (04/05 0524) Pulse Rate: 62  (04/05 0524)  Labs:  Flo Shanks 04/16/11 0736 04/16/11 0548 04/15/11 2324 04/15/11 2323 04/15/11 1821 04/15/11 1736  HGB -- 12.0 -- -- -- 12.8  HCT -- 37.0 -- -- -- 38.9  PLT -- 198 -- -- -- 204  APTT -- -- -- -- -- --  LABPROT -- -- -- -- -- 13.9  INR -- -- -- -- -- 1.05  HEPARINUNFRC 0.32 -- 0.18* -- -- --  CREATININE -- 0.88 -- -- -- 0.98  CKTOTAL -- 82 -- 86 99 --  CKMB -- 1.4 -- 2.0 2.1 --  TROPONINI -- <0.30 -- <0.30 <0.30 --   Estimated Creatinine Clearance: 88 ml/min (by C-G formula based on Cr of 0.88).  Medical History: Past Medical History  Diagnosis Date  . Angina   . Hypertension   . Unstable angina 04/15/2011  . CAD (coronary artery disease), non obstructive CAD in 2010 04/15/2011  . Abnormal stress test, Lexiscan myoview 03/25/2011-sm. anterior ischemia 04/15/2011  . HTN (hypertension) 04/15/2011  . Dyslipidemia 04/15/2011  . Metabolic syndrome 99991111  . Tobacco abuse 04/15/2011  . Family history of premature CAD 04/15/2011    Medications:  Scheduled:     . amLODipine  10 mg Oral QHS  . aspirin  324 mg Oral NOW   Or  . aspirin  300 mg Rectal NOW  . aspirin  324 mg Oral Pre-Cath  . aspirin EC  81 mg Oral Daily  . atorvastatin  10 mg Oral q1800  . heparin  2,000 Units Intravenous Once  . heparin  4,000 Units Intravenous Once  . lisinopril  20 mg Oral BID  . nebivolol  10 mg Oral BID  . sodium chloride  3 mL Intravenous Q12H    Assessment: Pt was directly  admitted from MD office for chest pain in patient with known CAD. Heparin level is therapeutic this morning following rate increase last night. CE are negative. Hgb and platelets are stable. No bleeding noted.  Plan for cath today..   Goal of Therapy:  Heparin level 0.3-0.7 units/ml   Plan:  1. Continue heparin at current rate of 1300 units/hr and f/u after cath.    Clovis Riley 04/16/2011,8:27 AM

## 2011-04-16 NOTE — Progress Notes (Signed)
ANTICOAGULATION CONSULT NOTE - Follow Up  Pharmacy Consult for Heparin Indication: chest pain/ACS  Allergies  Allergen Reactions  . Simvastatin     myalgias  . Wellbutrin (Bupropion Hcl)     unknown  . Zetia (Ezetimibe)     myalgias    Patient Measurements: Height: 5\' 4"  (162.6 cm) Weight: 212 lb (96.163 kg) IBW/kg (Calculated) : 54.7  Heparin Dosing Weight: 78 kg  Vital Signs: Temp: 97.1 F (36.2 C) (04/04 2005) Temp src: Oral (04/04 2005) BP: 136/72 mmHg (04/04 2005) Pulse Rate: 63  (04/04 2005)  Labs:  Basename 04/15/11 2324 04/15/11 2323 04/15/11 1821 04/15/11 1736  HGB -- -- -- 12.8  HCT -- -- -- 38.9  PLT -- -- -- 204  APTT -- -- -- --  LABPROT -- -- -- 13.9  INR -- -- -- 1.05  HEPARINUNFRC 0.18* -- -- --  CREATININE -- -- -- 0.98  CKTOTAL -- 86 99 --  CKMB -- 2.0 2.1 --  TROPONINI -- <0.30 <0.30 --   Estimated Creatinine Clearance: 79 ml/min (by C-G formula based on Cr of 0.98).  Medical History: Past Medical History  Diagnosis Date  . Angina   . Hypertension   . Unstable angina 04/15/2011  . CAD (coronary artery disease), non obstructive CAD in 2010 04/15/2011  . Abnormal stress test, Lexiscan myoview 03/25/2011-sm. anterior ischemia 04/15/2011  . HTN (hypertension) 04/15/2011  . Dyslipidemia 04/15/2011  . Metabolic syndrome 99991111  . Tobacco abuse 04/15/2011  . Family history of premature CAD 04/15/2011    Medications:  Scheduled:     . amLODipine  10 mg Oral QHS  . aspirin  324 mg Oral NOW   Or  . aspirin  300 mg Rectal NOW  . aspirin  324 mg Oral Pre-Cath  . aspirin EC  81 mg Oral Daily  . atorvastatin  10 mg Oral q1800  . heparin  4,000 Units Intravenous Once  . lisinopril  20 mg Oral BID  . nebivolol  10 mg Oral BID  . sodium chloride  3 mL Intravenous Q12H    Assessment: Pt was directly admitted from MD office for cardiac cath on 04/16/11. Heparin level (0.18) is below-goal on 1000 units/hr. No problem with line per RN.   Goal of Therapy:   Heparin level 0.3-0.7 units/ml   Plan:  1. Heparin IV bolus of 2000 units x 1, then increase IV heparin infusion to 1300 units/hr. 2. Heparin level in 6 hours.   Otila Back 04/16/2011,12:47 AM

## 2011-04-16 NOTE — Brief Op Note (Signed)
04/15/2011 - 04/16/2011  11:53 AM Cardiac Catehrization  PATIENT:  [name]  51 y.o. female  PRE-OPERATIVE DIAGNOSIS:  Chest pain  Full note dictated; see diagram   DICTATION # [id number], [id number]  LM: nl LAD:  30 and 20% stenosis LCX: nl RCA: 40% mid; 60% at crux, and 50 distal  LV Function; EF 65%  Mild 30% narrowing in inferior branch of L renal artery. Tolerated well.  REC: Increased medical therapy.  Troy Sine, MD, [facility] 04/16/2011 11:54 AM

## 2011-04-16 NOTE — Consult Note (Signed)
Pt smokes 1 ppd and says she is very eager to quit. She needs help with quitting. Recommended to start with 21 mg patch. Discussed patch use instructions and how to taper. Referred to 1-800 quit now for f/u and support. Discussed oral fixation substitutes, second hand smoke and in home smoking policy. Reviewed and gave pt Written education/contact information.Pt interested in learning more about e-cigarettes. Discouraged use and explained harmful effect with pt in detail.

## 2011-04-16 NOTE — Progress Notes (Addendum)
year old white female with history of cardiac cath by Dr. Elisabeth Cara in 2010 with 60% LAD stenosis, 50 % RCA stenosis mid vessel and 70% distal RCA, with normal LV function. She was seen in the office 03/18/2011 for chest pain that occurred mostly at rest. She was hypertensive and medications were adjusted and she was scheduled for a lexiscan myoview. The myoview was suspicious for ant. Ischemia. She presented today for follow up of results. She complained of chest pressure 7/10, associated with nausea and SOB. EKG with deeper t-wave inversion I, and AVL. 1 NTG was given with resolution of chest pain. Reviewed EKG and myoview results with Dr. Sallyanne Kuster, it was felt she should be admitted to tele and undergo cardiac cath in the AM. Pt agreeable  Subjective: No chest pain this am  Objective: Vital signs in last 24 hours: Temp:  [96.9 F (36.1 C)-97.6 F (36.4 C)] 97.6 F (36.4 C) (04/05 0524) Pulse Rate:  [62-64] 62  (04/05 0524) Resp:  [16-18] 16  (04/05 0524) BP: (136-168)/(72-81) 144/76 mmHg (04/05 0524) SpO2:  [91 %-98 %] 91 % (04/05 0524) Weight:  [96.163 kg (212 lb)] 96.163 kg (212 lb) (04/04 1645) Weight change:  Last BM Date: 04/15/11 Intake/Output from previous day:+840 04/04 0701 - 04/05 0700 In: 840 [P.O.:840] Out: -  Intake/Output this shift: Total I/O In: 3 [I.V.:3] Out: -   PE: General: no CP No JVD; thick neck Heart: RRR Lungs: no rales Abd:BS+ Ext:no edema   Lab Results:  Basename 04/16/11 0548 04/15/11 1736  WBC 10.3 9.9  HGB 12.0 12.8  HCT 37.0 38.9  PLT 198 204   BMET  Basename 04/16/11 0548 04/15/11 1736  NA 138 136  K 3.5 3.9  CL 105 100  CO2 22 24  GLUCOSE 111* 135*  BUN 16 20  CREATININE 0.88 0.98  CALCIUM 8.1* 8.9    Basename 04/16/11 0548 04/15/11 2323  TROPONINI <0.30 <0.30    Lab Results  Component Value Date   CHOL 126 04/16/2011   HDL 26* 04/16/2011   LDLCALC 62 04/16/2011   TRIG 191* 04/16/2011   CHOLHDL 4.8 04/16/2011   Lab Results    Component Value Date   HGBA1C 6.1* 04/15/2011     Lab Results  Component Value Date   TSH 1.405 04/15/2011    Hepatic Function Panel No results found for this basename: PROT,ALBUMIN,AST,ALT,ALKPHOS,BILITOT,BILIDIR,IBILI in the last 72 hours  Basename 04/16/11 0548  CHOL 126   No results found for this basename: PROTIME in the last 72 hours    EKG: Orders placed during the hospital encounter of 04/15/11  . EKG 12-LEAD  . EKG 12-LEAD  . EKG 12-LEAD    Studies/Results: No results found.  Medications: I have reviewed the patient's current medications.    Marland Kitchen amLODipine  10 mg Oral QHS  . aspirin  324 mg Oral NOW   Or  . aspirin  300 mg Rectal NOW  . aspirin  324 mg Oral Pre-Cath  . aspirin EC  81 mg Oral Daily  . atorvastatin  10 mg Oral q1800  . heparin  2,000 Units Intravenous Once  . heparin  4,000 Units Intravenous Once  . lisinopril  20 mg Oral BID  . nebivolol  10 mg Oral BID  . sodium chloride  3 mL Intravenous Q12H   Assessment/Plan: Patient Active Problem List  Diagnoses  . Unstable angina  . CAD (coronary artery disease), non obstructive CAD in 2010  . Abnormal stress test, Lexiscan  myoview 03/25/2011-sm. anterior ischemia  . HTN (hypertension)  . Dyslipidemia  . Metabolic syndrome  . Tobacco abuse  . Family history of premature CAD   PLAN: Cardiac cath today.  Dr. Claiborne Billings to see and examine.  Resume Maxide tomorrow for improved BP control.  LOS: 1 day   INGOLD,LAURA R 04/16/2011, 10:13 AM   Patient seen and examined. Agree with assessment and plan.  No further chest pain. ECG with TWI in I and avL. Body habitus may contribute to attenuation defects.  Cath with poss PCI discussed. Plan this am. Awaiting results of urine pregnancy screen.   Troy Sine, MD, [facility] 04/16/2011 10:29 AM

## 2011-04-16 NOTE — Cardiovascular Report (Signed)
NAMECHAZMIN, GANA             ACCOUNT NO.:  [id number]  MEDICAL RECORD NO.:  [id number]  LOCATION:  2003                         FACILITY:  [facility]  PHYSICIAN:  Shelva Majestic, M.D.     DATE OF BIRTH:  27-Jun-1960  DATE OF PROCEDURE: DATE OF DISCHARGE:                           CARDIAC CATHETERIZATION   PROCEDURE:  Cardiac catheterization.  INDICATIONS:  [name] is a 51 year old female who has a history of mild obesity, hypertension, and prior documented coronary artery disease by cardiac catheterization in 2010, which demonstrated a 60% LAD stenosis, 50% mid RCA stenosis, 70%, distal RCA stenosis with normal LV function.  She was treated medically.  She recently had a [place] study for evaluation of chest pain, which was suspicious for possible anterior ischemia.  The patient was seen in the office yesterday and had T-wave inversion in 1 and L.  She was admitted and now scheduled for cardiac catheterization today.  PROCEDURE:  After premedication with Versed 2 mg plus fentanyl 50 mcg, the patient was prepped and draped in usual fashion.  Her right femoral artery was punctured anteriorly and a 5-French sheath was inserted without difficulty.  Diagnostic cardiac catheterization was done utilizing 5-French Judkins 4 left and right coronary catheters.  200 mcg of intracoronary nitroglycerin was selectively administered down the right coronary artery.  A 5-French pigtail catheter was used for RAO ventriculography.  With the patient's hypertensive history, distal aortography was also performed to evaluate for renal artery stenosis. Before I broke scrub, I did review the angiographic films from 2010 study.  Hemostasis was obtained by direct manual pressure.  She tolerated the procedure well and returned to her room in satisfactory condition.  HEMODYNAMIC DATA:  Central aortic pressure 133/68, left ventricular pressure 133/9, post A-wave 17.  ANGIOGRAPHIC DATA:   The left main coronary artery was angiographically normal and bifurcated into an LAD and left circumflex system.  The LAD gave rise to a proximal diagonal vessel, then a large septal perforating artery.  There was mild 30% narrowing after the septal perforating artery.  There was 20% mid LAD narrowing.  The LAD seemed to improve from the prior study.  The circumflex vessel was free of significant disease and ended in the large marginal inferolateral branch.  There was no stenoses.  The right coronary artery had irregularities.  There was 40% mild narrowing in the mid segment after an anterior RV marginal branch.  At the acute margin, there was smooth narrowing of 60%.  Just proximal to the PDA takeoff, there was smooth 50% narrowing.  Following IC nitroglycerin administration, these stenoses did not significantly improve and remained fixed.  Left ventriculography revealed vigorous LV contractility with an ejection fraction of 65%.  Papillary muscles were very prominent.  Distal aortography did not reveal significant renal artery stenosis. The right renal artery was singular and patent.  The left renal artery immediately bifurcated, and there was mild smooth 30% narrowing in the inferior branch at its origin.  Otherwise, there was no significant aortoiliac disease.  IMPRESSION: 1. Normal left ventricular function. 2. Mild-to-moderate coronary obstructive disease with 30% narrowing in     the left anterior descending after septal perforating artery and  20% mid left anterior descending stenosis; normal left circumflex     coronary artery; irregularities in the right coronary artery with     40% mid narrowing, 60% stenosis that was smooth in the region of     the crux and 50% stenosis just proximal to the posterior descending     artery takeoff.  When these angiographic findings were reviewed     with the prior study in 2010, there does not appear to be     significant  progressive disease and in fact, there does appear to     be some improvement suggesting mild regression. 3. Mild smooth 30% narrowing in the inferior branch of the left renal     artery.  RECOMMENDATION:  Increase medical therapy trial.          ______________________________ Shelva Majestic, M.D.     TK/MEDQ  D:  04/16/2011  T:  04/16/2011  Job:  [id number]

## 2011-04-16 NOTE — Progress Notes (Signed)
UR Completed. Simmons, Davonne Jarnigan F [phone]  

## 2011-04-16 NOTE — Progress Notes (Signed)
Pt ambulated after 4 hrs of bedrest.  Tolerated well and groin is stable.  Dressing changed from gauze to adhesive bandage.  Will continue to monitor.  Pt back to bed with family at bedside and call light w/i reach.

## 2011-04-17 ENCOUNTER — Other Ambulatory Visit: Payer: Self-pay

## 2011-04-17 MED ORDER — ISOSORBIDE MONONITRATE ER 30 MG PO TB24: 30.0000 mg | ORAL_TABLET | Freq: Every day | ORAL | Status: DC

## 2011-04-17 MED ORDER — NIACIN ER (ANTIHYPERLIPIDEMIC) 500 MG PO TBCR: 500.0000 mg | EXTENDED_RELEASE_TABLET | Freq: Every day | ORAL | Status: DC

## 2011-04-17 MED ORDER — ROSUVASTATIN CALCIUM 5 MG PO TABS: ORAL_TABLET | ORAL | Status: DC

## 2011-04-17 MED ORDER — ACETAMINOPHEN 325 MG PO TABS: 650.0000 mg | ORAL_TABLET | ORAL | Status: DC | PRN

## 2011-04-17 MED ORDER — PANTOPRAZOLE SODIUM 40 MG PO TBEC: 40.0000 mg | DELAYED_RELEASE_TABLET | Freq: Every day | ORAL | Status: DC

## 2011-04-17 MED ORDER — LISINOPRIL 20 MG PO TABS: 20.0000 mg | ORAL_TABLET | Freq: Two times a day (BID) | ORAL | Status: DC

## 2011-04-17 MED ORDER — ASPIRIN 81 MG PO CHEW: CHEWABLE_TABLET | ORAL | Status: DC

## 2011-04-17 NOTE — Progress Notes (Signed)
[facility] Progress Note  Subjective:  No chest pain  Objective:   Vital Signs in the last 24 hours: Temp:  [98 F (36.7 C)-98.2 F (36.8 C)] 98 F (36.7 C) (04/06 0450) Pulse Rate:  [55-70] 64  (04/06 0450) Resp:  [18-20] 18  (04/06 0450) BP: (136-165)/(72-87) 165/87 mmHg (04/06 0955) SpO2:  [91 %-95 %] 91 % (04/06 0450)  Intake/Output from previous day: 04/05 0701 - 04/06 0700 In: 723 [P.O.:720; I.V.:3] Out: -   Scheduled:   . amLODipine  10 mg Oral QHS  . aspirin EC  81 mg Oral Daily  . atorvastatin  40 mg Oral q1800  . fentaNYL      . heparin      . isosorbide mononitrate  30 mg Oral Daily  . lidocaine      . lisinopril  20 mg Oral BID  . midazolam      . midazolam      . nebivolol  10 mg Oral BID  . nitroGLYCERIN      . DISCONTD: atorvastatin  10 mg Oral q1800  . DISCONTD: sodium chloride  3 mL Intravenous Q12H    Physical Exam:   General appearance: alert, cooperative and no distress Neck: no adenopathy, no carotid bruit, no JVD, supple, symmetrical, trachea midline and thyroid not enlarged, symmetric, no tenderness/mass/nodules Lungs: clear to auscultation bilaterally Heart: regular rate and rhythm, S1, S2 normal, no murmur, click, rub or gallop Abdomen: soft, non-tender; bowel sounds normal; no masses,  no organomegaly R groin cath site stable Extremities: no edema, redness or tenderness in the calves or thighs Neurologic: Grossly normal   Rate:60  Rhythm: normal sinus rhythm  Lab Results:    Basename 04/16/11 0548 04/15/11 1736  NA 138 136  K 3.5 3.9  CL 105 100  CO2 22 24  GLUCOSE 111* 135*  BUN 16 20  CREATININE 0.88 0.98    Basename 04/16/11 0548 04/15/11 2323  TROPONINI <0.30 <0.30   Hepatic Function Panel No results found for this basename: PROT,ALBUMIN,AST,ALT,ALKPHOS,BILITOT,BILIDIR,IBILI in the last 72 hours  Basename 04/15/11 1736  INR 1.05    Lipid Panel     Component Value Date/Time   CHOL 126 04/16/2011 0548   TRIG 191* 04/16/2011 0548   HDL 26* 04/16/2011 0548   CHOLHDL 4.8 04/16/2011 0548   VLDL 38 04/16/2011 0548   LDLCALC 62 04/16/2011 0548     Imaging:  No results found.    Assessment/Plan:   Active Problems:  Unstable angina  CAD (coronary artery disease), non obstructive CAD in 2010  Abnormal stress test, Lexiscan myoview 03/25/2011-sm. anterior ischemia  HTN (hypertension)  Dyslipidemia  Metabolic syndrome  Tobacco abuse  Family history of premature CAD  Pt feels well.  Lipid panel is consistent with an atherogenic dyslipidemic pattern with probable increased LDL particle concentration despite low LDl, but with continued significant increased  Non-HDL cholestral, TG, VLDL, IDL and low HDL.. Will add niaspam  At 500 mg to Lipitor 40 mg.  Pt requests that I follow her as an outpt since she has not yet seen anyone in our office since dr. Elisabeth Cara left.  Will F/U in 2-3 weeks.     Troy Sine, MD, [facility] 04/17/2011, 10:11 AM

## 2011-04-17 NOTE — Discharge Instructions (Signed)
Call [facility] if any bleeding, swelling or drainage at cath site.  May shower, no tub baths for 48 hours for groin sticks.  No driving for 2 days  No lifting for 3 days over 8 pounds Heart healthy diet, decrease sweets, better to eat whole wheat instead of white bread.  With Niaspan, take at night take a baby asprin 30 min before you take the niaspan.  Take the niaspan with low fat snack, low fat crackers.  May cause hot flash type feeling but the above plan will assist to prevent that from happening. May return to work on Monday the 8th of April

## 2011-04-17 NOTE — Progress Notes (Signed)
04/17/2011 12:32 PM Nursing note Discharge avs form, medications already taken today and those due this evening given and explained to patient. Called pt. Pharmacy to check affordability of medications. Patient stated she had resources needed to obtain medications ordered at discharge. Follow up appointments discussed along with when to call the doctor/incision care. Questions and concerns addressed. D/c iv line. D/c tele. D/c home per orders.  Sherica Paternostro, Arville Lime

## 2011-04-17 NOTE — Discharge Summary (Signed)
Physician Discharge Summary  Patient ID: [name] MRN: [id number] DOB/AGE: 1961/01/08 51 y.o.  Admit date: 04/15/2011 Discharge date: 04/17/2011  Discharge Diagnoses:  Active Problems:  Unstable angina, most likely GI with stable CAD by cath 04/16/11  CAD (coronary artery disease), non obstructive CAD in 2010, no change with cath 04/16/2011  Abnormal stress test, Lexiscan myoview 03/25/2011-sm. anterior ischemia  Family history of premature CAD  Dyslipidemia  Tobacco abuse  HTN (hypertension)  Metabolic syndrome   Discharged Condition: good  Procedures: 04/16/2011, left heart cath by Dr. Shelva Majestic.  Hospital Course: 51 year old white female with history of cardiac cath by Dr. Elisabeth Cara in 2010 with 60% LAD stenosis, 50 % RCA stenosis mid vessel and 70% distal RCA, with normal LV function. She was seen in the office 03/18/2011 for chest pain that occurred mostly at rest. She was hypertensive and medications were adjusted and she was scheduled for a lexiscan myoview. The myoview was suspicious for ant. Ischemia. She presented to the office 04/15/2011 for follow up of results. She complained of chest pressure 7/10, associated with nausea and SOB. EKG with deeper t-wave inversion I, and AVL. 1 NTG was given with resolution of chest pain. Reviewed EKG and myoview results with Dr. Sallyanne Kuster, it was felt she should be admitted to tele and undergo cardiac cath the next AM. Pt was agreeable.  She was admitted to [facility]  telemetry bed and  placed on IV heparin. Cardiac enzymes were negative and she underwent cardiac catheterization with Dr. Claiborne Billings 4/5/ 2013.    Cardiac cath revealed: LM: nl  LAD: 30 and 20% stenosis  LCX: nl  RCA: 40% mid; 60% at crux, and 50 distal  LV Function; EF 65%  Mild 30% narrowing in inferior branch of L renal artery  By the next morning patient was without complaints and ready for discharge. She ambulated in the hall without problems.  She was seen by Dr. Claiborne Billings and  felt to be stable for discharge.  Also at discharge she was to resume her Maxzide that she was taking as an outpatient as well.  Imdur had been added to her medical regimen we increased her amount of Crestor that she was on as well. Protonix What is added as her chest pain may be related to GI issues she does have  nausea associated with her chest pain. We also added Niaspan to her medical regimen.     Consults: None  Significant Diagnostic Studies:  Prior to discharge sodium 138 potassium 3.5 chloride 105 CO2 22 BUN 16 creatinine 0.88 calcium 8.1 glucose 111.  Cardiac enzymes were all negative with troponin less than 0.30.  Total cholesterol 126 triglycerides 191 HDL 26 and LDL 62  Hemoglobin 12.0 hematocrit 37 the CBC 10.3 and platelets 198.   Hemoglobin A1c 6.1, TSH 1.405  Discharge Exam: Blood pressure 165/87, pulse 64, temperature 98 F (36.7 C), temperature source Oral, resp. rate 18, height 5\' 4"  (1.626 m), weight 96.163 kg (212 lb), last menstrual period 02/26/2011, SpO2 91.00%.   General appearance: alert, cooperative and no distress  Neck: no adenopathy, no carotid bruit, no JVD, supple, symmetrical, trachea midline and thyroid not enlarged, symmetric, no tenderness/mass/nodules  Lungs: clear to auscultation bilaterally  Heart: regular rate and rhythm, S1, S2 normal, no murmur, click, rub or gallop  Abdomen: soft, non-tender; bowel sounds normal; no masses, no organomegaly  R groin cath site stable  Extremities: no edema, redness or tenderness in the calves or thighs  Neurologic: Grossly normal  Disposition: 01-Home or Self Care   Medication List  As of 04/17/2011  5:00 PM   TAKE these medications         acetaminophen 325 MG tablet   Commonly known as: TYLENOL   Take 2 tablets (650 mg total) by mouth every 4 (four) hours as needed.      amLODipine 10 MG tablet   Commonly known as: NORVASC   Take 10 mg by mouth at bedtime.      aspirin 81 MG chewable tablet    Take at night prior to Niaspan      isosorbide mononitrate 30 MG 24 hr tablet   Commonly known as: IMDUR   Take 1 tablet (30 mg total) by mouth daily.      lisinopril 20 MG tablet   Commonly known as: PRINIVIL,ZESTRIL   Take 1 tablet (20 mg total) by mouth 2 (two) times daily.      nebivolol 10 MG tablet   Commonly known as: BYSTOLIC   Take 10 mg by mouth 2 (two) times daily.      niacin 500 MG CR tablet   Commonly known as: NIASPAN   Take 1 tablet (500 mg total) by mouth at bedtime.      pantoprazole 40 MG tablet   Commonly known as: PROTONIX   Take 1 tablet (40 mg total) by mouth daily at 12 noon.      rosuvastatin 5 MG tablet   Commonly known as: CRESTOR   Takes on Mon, Tues, Wed and Fridaysand Saturday.           Follow-up Information    Follow up with Troy Sine, MD. (our office will call you Monday with follow up)    Contact information:   [address] [place] [phone]        Discharge instructions:  Call [facility] if any bleeding, swelling or drainage at cath site.  May shower, no tub baths for 48 hours for groin sticks.  No driving for 2 days  No lifting for 3 days over 8 pounds Heart healthy diet, decrease sweets, better to eat whole wheat instead of white bread.  With Niaspan, take at night take a baby asprin 30 min before you take the niaspan.  Take the niaspan with low fat snack, low fat crackers.  May cause hot flash type feeling but the above plan will assist to prevent that from happening. May return to work on Monday the 8th of April   Signed: Shaunn Tackitt R 04/17/2011, 5:00 PM

## 2011-04-17 NOTE — Progress Notes (Signed)
Subjective: No chest pain  Objective: Vital signs in last 24 hours: Temp:  [98 F (36.7 C)-98.2 F (36.8 C)] 98 F (36.7 C) (04/06 0450) Pulse Rate:  [55-70] 64  (04/06 0450) Resp:  [18-20] 18  (04/06 0450) BP: (136-165)/(72-87) 165/87 mmHg (04/06 0955) SpO2:  [91 %-95 %] 91 % (04/06 0450) Weight change:  Last BM Date: 04/15/11 Intake/Output from previous day: 04/05 0701 - 04/06 0700 In: 723 [P.O.:720; I.V.:3] Out: -  Intake/Output this shift:    PE: General: Heart: Lungs: Abd: Ext:    Lab Results:  Basename 04/16/11 0548 04/15/11 1736  WBC 10.3 9.9  HGB 12.0 12.8  HCT 37.0 38.9  PLT 198 204   BMET  Basename 04/16/11 0548 04/15/11 1736  NA 138 136  K 3.5 3.9  CL 105 100  CO2 22 24  GLUCOSE 111* 135*  BUN 16 20  CREATININE 0.88 0.98  CALCIUM 8.1* 8.9    Basename 04/16/11 0548 04/15/11 2323  TROPONINI <0.30 <0.30    Lab Results  Component Value Date   CHOL 126 04/16/2011   HDL 26* 04/16/2011   LDLCALC 62 04/16/2011   TRIG 191* 04/16/2011   CHOLHDL 4.8 04/16/2011   Lab Results  Component Value Date   HGBA1C 6.1* 04/15/2011     Lab Results  Component Value Date   TSH 1.405 04/15/2011    Hepatic Function Panel No results found for this basename: PROT,ALBUMIN,AST,ALT,ALKPHOS,BILITOT,BILIDIR,IBILI in the last 72 hours  Basename 04/16/11 0548  CHOL 126   No results found for this basename: PROTIME in the last 72 hours    EKG: Orders placed during the hospital encounter of 04/15/11  . EKG 12-LEAD  . EKG 12-LEAD  . EKG 12-LEAD  . EKG 12-LEAD    Studies/Results: No results found.  Medications: I have reviewed the patient's current medications.    Marland Kitchen amLODipine  10 mg Oral QHS  . aspirin EC  81 mg Oral Daily  . atorvastatin  40 mg Oral q1800  . fentaNYL      . heparin      . isosorbide mononitrate  30 mg Oral Daily  . lidocaine      . lisinopril  20 mg Oral BID  . midazolam      . midazolam      . nebivolol  10 mg Oral BID  . nitroGLYCERIN       . DISCONTD: atorvastatin  10 mg Oral q1800  . DISCONTD: sodium chloride  3 mL Intravenous Q12H   Assessment/Plan: Patient Active Problem List  Diagnoses  . Unstable angina, most likely GI with stable CAD by cath 04/16/11  . CAD (coronary artery disease), non obstructive CAD in 2010, no change with cath 04/16/2011  . Abnormal stress test, Lexiscan myoview 03/25/2011-sm. anterior ischemia  . HTN (hypertension)  . Dyslipidemia  . Metabolic syndrome  . Tobacco abuse  . Family history of premature CAD   PLAN:  LOS: 2 days   [name] R 04/17/2011, 10:15 AM

## 2011-08-17 ENCOUNTER — Encounter (HOSPITAL_BASED_OUTPATIENT_CLINIC_OR_DEPARTMENT_OTHER): Payer: Self-pay | Admitting: *Deleted

## 2011-08-17 ENCOUNTER — Emergency Department (HOSPITAL_BASED_OUTPATIENT_CLINIC_OR_DEPARTMENT_OTHER): Payer: Self-pay

## 2011-08-17 ENCOUNTER — Emergency Department (HOSPITAL_BASED_OUTPATIENT_CLINIC_OR_DEPARTMENT_OTHER)
Admission: EM | Admit: 2011-08-17 | Discharge: 2011-08-17 | Disposition: A | Discharge status: Home / Self Care | Payer: Self-pay | Attending: Emergency Medicine | Admitting: Emergency Medicine

## 2011-08-17 DIAGNOSIS — R42 Dizziness and giddiness: Secondary | ICD-10-CM | POA: Insufficient documentation

## 2011-08-17 DIAGNOSIS — K219 Gastro-esophageal reflux disease without esophagitis: Secondary | ICD-10-CM | POA: Insufficient documentation

## 2011-08-17 DIAGNOSIS — F17200 Nicotine dependence, unspecified, uncomplicated: Secondary | ICD-10-CM | POA: Insufficient documentation

## 2011-08-17 DIAGNOSIS — Z9089 Acquired absence of other organs: Secondary | ICD-10-CM | POA: Insufficient documentation

## 2011-08-17 DIAGNOSIS — I2510 Atherosclerotic heart disease of native coronary artery without angina pectoris: Secondary | ICD-10-CM | POA: Insufficient documentation

## 2011-08-17 DIAGNOSIS — I10 Essential (primary) hypertension: Secondary | ICD-10-CM | POA: Insufficient documentation

## 2011-08-17 DIAGNOSIS — R002 Palpitations: Secondary | ICD-10-CM

## 2011-08-17 DIAGNOSIS — R079 Chest pain, unspecified: Secondary | ICD-10-CM

## 2011-08-17 HISTORY — DX: Gastro-esophageal reflux disease without esophagitis: K21.9

## 2011-08-17 HISTORY — DX: Reserved for inherently not codable concepts without codable children: IMO0001

## 2011-08-17 LAB — CBC WITH DIFFERENTIAL/PLATELET
Basophils Relative: 1 % (ref 0–1)
Eosinophils Absolute: 0.3 10*3/uL (ref 0.0–0.7)
Lymphs Abs: 2 10*3/uL (ref 0.7–4.0)
MCH: 27.7 pg (ref 26.0–34.0)
MCHC: 32.7 g/dL (ref 30.0–36.0)
Neutrophils Relative %: 63 % (ref 43–77)
Platelets: 210 10*3/uL (ref 150–400)
RBC: 4.48 MIL/uL (ref 3.87–5.11)

## 2011-08-17 LAB — BASIC METABOLIC PANEL
GFR calc Af Amer: 75 mL/min — ABNORMAL LOW (ref >90)
GFR calc non Af Amer: 65 mL/min — ABNORMAL LOW (ref >90)
Potassium: 3.8 mEq/L (ref 3.5–5.1)
Sodium: 137 mEq/L (ref 135–145)

## 2011-08-17 LAB — TROPONIN I: Troponin I: <0.3 ng/mL (ref <0.30)

## 2011-08-17 MED ORDER — NITROGLYCERIN 2 % TD OINT
0.5000 [in_us] | TOPICAL_OINTMENT | Freq: Four times a day (QID) | TRANSDERMAL | Status: DC
  Administered 2011-08-17: 0.5 [in_us] via TOPICAL
  Filled 2011-08-17: qty 1

## 2011-08-17 MED ORDER — ACETAMINOPHEN 325 MG PO TABS
650.0000 mg | ORAL_TABLET | Freq: Once | ORAL | Status: AC
  Administered 2011-08-17: 650 mg via ORAL
  Filled 2011-08-17: qty 2

## 2011-08-17 MED ORDER — ASPIRIN 81 MG PO CHEW
324.0000 mg | CHEWABLE_TABLET | Freq: Once | ORAL | Status: AC
  Administered 2011-08-17: 324 mg via ORAL
  Filled 2011-08-17: qty 4

## 2011-08-17 NOTE — ED Notes (Signed)
Patient states she was working in her yard one week ago and shortly afterwards she developed left chest pain which has been associated with dizziness, nausea.  States symptoms improve with rest.

## 2011-08-17 NOTE — ED Provider Notes (Signed)
History     CSN: [id number]  Arrival date & time 08/17/11  0929   First MD Initiated Contact with Patient 08/17/11 1014      Chief Complaint  Patient presents with  . Dizziness  . Chest Pain    (Consider location/radiation/quality/duration/timing/severity/associated sxs/prior treatment) HPI Comments: She comes in complaining of heart palpitations and chest tightness. She states the palpitations have been gone on off-and-on for the last 2 days. She does have associated sharp pain with.heart palpitations however even in the palpitations stopped she describes some tightness which is mild across her chest sometimes radiating to her left shoulder. She does have some associated shortness of breath with the symptoms. In some mild dizziness. No nausea or vomiting. The palpitations happen both at rest and on exertion. She has no history of heart arrhythmias in the past. She does have a history of some coronary artery disease with her last cardiac cath being in April. She's followed by [facility] but has no primary care physician. She has a history of hypertension and hyperlipidemia.  Patient is a 51 y.o. female presenting with chest pain. The history is provided by the patient.  Chest Pain Primary symptoms include shortness of breath, palpitations and dizziness. Pertinent negatives for primary symptoms include no fever, no fatigue, no cough, no abdominal pain, no nausea and no vomiting.  The palpitations also occurred with dizziness and shortness of breath.   Dizziness does not occur with nausea, vomiting, weakness or diaphoresis.  Pertinent negatives for associated symptoms include no diaphoresis, no numbness and no weakness.     Past Medical History  Diagnosis Date  . Angina   . Hypertension   . Unstable angina 04/15/2011  . CAD (coronary artery disease), non obstructive CAD in 2010 04/15/2011  . Abnormal stress test, Lexiscan myoview 03/25/2011-sm. anterior  ischemia 04/15/2011  . HTN (hypertension) 04/15/2011  . Dyslipidemia 04/15/2011  . Metabolic syndrome 99991111  . Tobacco abuse 04/15/2011  . Family history of premature CAD 04/15/2011  . Reflux     Past Surgical History  Procedure Date  . Appendectomy   . Cardiac catheterization 2010  . Appendectomy     Family History  Problem Relation Age of Onset  . Stroke Father   . Coronary artery disease Brother     History  Substance Use Topics  . Smoking status: Current Everyday Smoker -- 1.0 packs/day for 30 years    Types: Cigarettes  . Smokeless tobacco: Not on file  . Alcohol Use: No    OB History    Grav Para Term Preterm Abortions TAB SAB Ect Mult Living                  Review of Systems  Constitutional: Negative for fever, chills, diaphoresis and fatigue.  HENT: Negative for congestion, rhinorrhea and sneezing.   Eyes: Negative.   Respiratory: Positive for shortness of breath. Negative for cough and chest tightness.   Cardiovascular: Positive for chest pain and palpitations. Negative for leg swelling.  Gastrointestinal: Negative for nausea, vomiting, abdominal pain, diarrhea and blood in stool.  Genitourinary: Negative for frequency, hematuria, flank pain and difficulty urinating.  Musculoskeletal: Negative for back pain and arthralgias.  Skin: Negative for rash.  Neurological: Positive for dizziness. Negative for speech difficulty, weakness, numbness and headaches.    Allergies  Simvastatin; Wellbutrin; and Zetia  Home Medications   Current Outpatient Rx  Name Route Sig Dispense Refill  . AMLODIPINE BESYLATE 10 MG PO TABS Oral  Take 10 mg by mouth at bedtime.    Marland Kitchen PANTOPRAZOLE SODIUM 40 MG PO TBEC Oral Take 1 tablet (40 mg total) by mouth daily at 12 noon. 30 tablet 11    BP 126/56  Pulse 72  Temp 97.4 F (36.3 C) (Oral)  Resp 14  SpO2 98%  LMP 08/13/2011  Physical Exam  Constitutional: She is oriented to person, place, and time. She appears well-developed  and well-nourished.  HENT:  Head: Normocephalic and atraumatic.  Eyes: Pupils are equal, round, and reactive to light.  Neck: Normal range of motion. Neck supple.  Cardiovascular: Normal rate, regular rhythm and normal heart sounds.   Pulmonary/Chest: Effort normal and breath sounds normal. No respiratory distress. She has no wheezes. She has no rales. She exhibits no tenderness.  Abdominal: Soft. Bowel sounds are normal. There is no tenderness. There is no rebound and no guarding.  Musculoskeletal: Normal range of motion. She exhibits no edema and no tenderness.  Lymphadenopathy:    She has no cervical adenopathy.  Neurological: She is alert and oriented to person, place, and time.  Skin: Skin is warm and dry. No rash noted.  Psychiatric: She has a normal mood and affect.    ED Course  Procedures (including critical care time)  Results for orders placed during the hospital encounter of 08/17/11  CBC WITH DIFFERENTIAL      Component Value Range   WBC 7.8  4.0 - 10.5 K/uL   RBC 4.48  3.87 - 5.11 MIL/uL   Hemoglobin 12.4  12.0 - 15.0 g/dL   HCT 37.9  36.0 - 46.0 %   MCV 84.6  78.0 - 100.0 fL   MCH 27.7  26.0 - 34.0 pg   MCHC 32.7  30.0 - 36.0 g/dL   RDW 14.3  11.5 - 15.5 %   Platelets 210  150 - 400 K/uL   Neutrophils Relative 63  43 - 77 %   Neutro Abs 4.9  1.7 - 7.7 K/uL   Lymphocytes Relative 25  12 - 46 %   Lymphs Abs 2.0  0.7 - 4.0 K/uL   Monocytes Relative 7  3 - 12 %   Monocytes Absolute 0.6  0.1 - 1.0 K/uL   Eosinophils Relative 4  0 - 5 %   Eosinophils Absolute 0.3  0.0 - 0.7 K/uL   Basophils Relative 1  0 - 1 %   Basophils Absolute 0.0  0.0 - 0.1 K/uL  BASIC METABOLIC PANEL      Component Value Range   Sodium 137  135 - 145 mEq/L   Potassium 3.8  3.5 - 5.1 mEq/L   Chloride 101  96 - 112 mEq/L   CO2 25  19 - 32 mEq/L   Glucose, Bld 183 (*) 70 - 99 mg/dL   BUN 14  6 - 23 mg/dL   Creatinine, Ser 1.00  0.50 - 1.10 mg/dL   Calcium 9.3  8.4 - 10.5 mg/dL   GFR calc  non Af Amer 65 (*) >90 mL/min   GFR calc Af Amer 75 (*) >90 mL/min  TROPONIN I      Component Value Range   Troponin I <0.30  <0.30 ng/mL   Dg Chest 2 View  08/17/2011  *RADIOLOGY REPORT*  Clinical Data: Dizziness, chest pain.  CHEST - 2 VIEW  Comparison: 02/06/2008  Findings: Calcified granuloma in the right upper lobe. Heart and mediastinal contours are within normal limits.  No focal opacities or effusions.  No acute  bony abnormality.  IMPRESSION: No active cardiopulmonary disease.  Original Report Authenticated By: Raelyn Number, M.D.      Date: 08/17/2011  Rate: 87  Rhythm: normal sinus rhythm  QRS Axis: normal  Intervals: normal  ST/T Wave abnormalities: nonspecific ST/T changes  Conduction Disutrbances:none  Narrative Interpretation:   Old EKG Reviewed: unchanged, only change is slight ST depression in V6   1. Palpitations   2. Chest pain       MDM  I reviewed her last cath report from April which showed some mild to moderate stenosis but was unchanged from her previous catheterization in 2010. I discussed the patient with the cardiologist on-call with [facility] cardiovascular he feels that the patient does not need to be admitted at this point given that she had a recent catheterization in April. He will arrange for her to have close followup in her office for further evaluation and possible Holter monitoring. I discussed these findings with the patient and advised her to contact her cardiologist if she does not hear from them by tomorrow otherwise return here she has any worsening symptoms.        Malvin Johns, MD 08/17/11 1155

## 2011-09-17 ENCOUNTER — Encounter: Payer: Self-pay | Admitting: Family Medicine

## 2012-07-03 ENCOUNTER — Other Ambulatory Visit: Payer: Self-pay | Admitting: Cardiology

## 2012-07-04 NOTE — Telephone Encounter (Signed)
Rx was sent to pharmacy electronically. 

## 2012-08-30 ENCOUNTER — Emergency Department (HOSPITAL_COMMUNITY): Payer: Medicaid Other

## 2012-08-30 ENCOUNTER — Observation Stay (HOSPITAL_COMMUNITY)
Admission: EM | Admit: 2012-08-30 | Discharge: 2012-09-02 | Disposition: A | Discharge status: Home / Self Care | Payer: Medicaid Other | Attending: Cardiovascular Disease | Admitting: Cardiovascular Disease

## 2012-08-30 ENCOUNTER — Encounter (HOSPITAL_COMMUNITY): Payer: Self-pay | Admitting: Emergency Medicine

## 2012-08-30 DIAGNOSIS — Y840 Cardiac catheterization as the cause of abnormal reaction of the patient, or of later complication, without mention of misadventure at the time of the procedure: Secondary | ICD-10-CM | POA: Insufficient documentation

## 2012-08-30 DIAGNOSIS — Q619 Cystic kidney disease, unspecified: Secondary | ICD-10-CM | POA: Insufficient documentation

## 2012-08-30 DIAGNOSIS — I10 Essential (primary) hypertension: Secondary | ICD-10-CM | POA: Diagnosis present

## 2012-08-30 DIAGNOSIS — R0781 Pleurodynia: Secondary | ICD-10-CM | POA: Diagnosis present

## 2012-08-30 DIAGNOSIS — I209 Angina pectoris, unspecified: Secondary | ICD-10-CM | POA: Diagnosis present

## 2012-08-30 DIAGNOSIS — E8881 Metabolic syndrome: Secondary | ICD-10-CM | POA: Diagnosis present

## 2012-08-30 DIAGNOSIS — E669 Obesity, unspecified: Secondary | ICD-10-CM | POA: Diagnosis present

## 2012-08-30 DIAGNOSIS — R9439 Abnormal result of other cardiovascular function study: Secondary | ICD-10-CM | POA: Diagnosis present

## 2012-08-30 DIAGNOSIS — F17200 Nicotine dependence, unspecified, uncomplicated: Secondary | ICD-10-CM | POA: Insufficient documentation

## 2012-08-30 DIAGNOSIS — Z6837 Body mass index (BMI) 37.0-37.9, adult: Secondary | ICD-10-CM | POA: Insufficient documentation

## 2012-08-30 DIAGNOSIS — IMO0002 Reserved for concepts with insufficient information to code with codable children: Secondary | ICD-10-CM | POA: Insufficient documentation

## 2012-08-30 DIAGNOSIS — I2510 Atherosclerotic heart disease of native coronary artery without angina pectoris: Secondary | ICD-10-CM | POA: Diagnosis present

## 2012-08-30 DIAGNOSIS — S301XXA Contusion of abdominal wall, initial encounter: Secondary | ICD-10-CM

## 2012-08-30 DIAGNOSIS — Z8249 Family history of ischemic heart disease and other diseases of the circulatory system: Secondary | ICD-10-CM

## 2012-08-30 DIAGNOSIS — S3012XA Contusion of groin, initial encounter: Secondary | ICD-10-CM

## 2012-08-30 DIAGNOSIS — I200 Unstable angina: Secondary | ICD-10-CM

## 2012-08-30 DIAGNOSIS — Z79899 Other long term (current) drug therapy: Secondary | ICD-10-CM | POA: Insufficient documentation

## 2012-08-30 DIAGNOSIS — R079 Chest pain, unspecified: Principal | ICD-10-CM | POA: Insufficient documentation

## 2012-08-30 DIAGNOSIS — Z720 Tobacco use: Secondary | ICD-10-CM | POA: Diagnosis present

## 2012-08-30 DIAGNOSIS — E66813 Obesity, class 3: Secondary | ICD-10-CM | POA: Diagnosis present

## 2012-08-30 DIAGNOSIS — Z23 Encounter for immunization: Secondary | ICD-10-CM | POA: Insufficient documentation

## 2012-08-30 DIAGNOSIS — E785 Hyperlipidemia, unspecified: Secondary | ICD-10-CM | POA: Diagnosis present

## 2012-08-30 LAB — CBC WITH DIFFERENTIAL/PLATELET
Lymphocytes Relative: 31 % (ref 12–46)
Lymphs Abs: 2.7 10*3/uL (ref 0.7–4.0)
Neutrophils Relative %: 55 % (ref 43–77)
Platelets: 263 10*3/uL (ref 150–400)
RBC: 4.6 MIL/uL (ref 3.87–5.11)
WBC: 9 10*3/uL (ref 4.0–10.5)

## 2012-08-30 LAB — POCT I-STAT TROPONIN I: Troponin i, poc: 0 ng/mL (ref 0.00–0.08)

## 2012-08-30 LAB — POCT I-STAT, CHEM 8
Calcium, Ion: 1.15 mmol/L (ref 1.12–1.23)
Glucose, Bld: 102 mg/dL — ABNORMAL HIGH (ref 70–99)
HCT: 40 % (ref 36.0–46.0)
Hemoglobin: 13.6 g/dL (ref 12.0–15.0)
TCO2: 25 mmol/L (ref 0–100)

## 2012-08-30 MED ORDER — SODIUM CHLORIDE 0.9 % IV SOLN
Freq: Once | INTRAVENOUS | Status: AC
  Administered 2012-08-30: 20:00:00 via INTRAVENOUS

## 2012-08-30 MED ORDER — PNEUMOCOCCAL VAC POLYVALENT 25 MCG/0.5ML IJ INJ
0.5000 mL | INJECTION | INTRAMUSCULAR | Status: AC
  Administered 2012-08-31: 0.5 mL via INTRAMUSCULAR
  Filled 2012-08-30: qty 0.5

## 2012-08-30 NOTE — ED Notes (Signed)
[facility] presents with a 52 yo female from [place] seeking medical attention for chest pressure she had since 1 pm today.  Pt reports chest pressure began yesterday but reported went to sleep/nap and pain/pressure went away.  Pt was at work went reoccurrence of symptoms began.  Some blurred vision/ nausea/dizziness.  81 mg ASA was given at [place] and [facility] gave 243 mg ASA and 1 of NTG.  Pt is now pain free.  Hx of HTN and catherization.

## 2012-08-30 NOTE — ED Provider Notes (Signed)
CSN: [id number]     Arrival date & time 08/30/12  1933 History     First MD Initiated Contact with Patient 08/30/12 1939     Chief Complaint  Patient presents with  . Chest Pain   (Consider location/radiation/quality/duration/timing/severity/associated sxs/prior Treatment) HPI Comments: 52 year old female with a history of hypertension and hypercholesterolemia who presents with a complaint of chest pain associated with the headache. She states that approximately 1:00 today she developed a chest pressure which has been intermittent throughout the afternoon. It is associated with a pressure of her bilateral for him. After receiving nitroglycerin and aspirin her chest pain has again resolved though it has been fluctuating throughout the day. She does not get short of breath nor does she get diaphoretic or nauseated with the pain. Currently she has no chest pressure, 2/10 headache. She has had 2 heart catheterizations in the past and according to her has had no more than a 60% lesion, no interventions at that time.  Patient is a 52 y.o. female presenting with chest pain. The history is provided by the patient.  Chest Pain   Past Medical History  Diagnosis Date  . Angina   . Hypertension   . Unstable angina 04/15/2011  . CAD (coronary artery disease), non obstructive CAD in 2010 04/15/2011  . Abnormal stress test, Lexiscan myoview 03/25/2011-sm. anterior ischemia 04/15/2011  . HTN (hypertension) 04/15/2011  . Dyslipidemia 04/15/2011  . Metabolic syndrome 99991111  . Tobacco abuse 04/15/2011  . Family history of premature CAD 04/15/2011  . Reflux    Past Surgical History  Procedure Laterality Date  . Appendectomy    . Cardiac catheterization  2010  . Appendectomy     Family History  Problem Relation Age of Onset  . Stroke Father   . Coronary artery disease Brother    History  Substance Use Topics  . Smoking status: Current Every Day Smoker -- 1.00 packs/day for 30 years    Types: Cigarettes   . Smokeless tobacco: Not on file  . Alcohol Use: No   OB History   Grav Para Term Preterm Abortions TAB SAB Ect Mult Living                 Review of Systems  Cardiovascular: Positive for chest pain.  All other systems reviewed and are negative.    Allergies  Simvastatin; Wellbutrin; and Zetia  Home Medications   Current Outpatient Rx  Name  Route  Sig  Dispense  Refill  . amLODipine (NORVASC) 10 MG tablet   Oral   Take 10 mg by mouth every evening.         Marland Kitchen aspirin 81 MG tablet   Oral   Take 81 mg by mouth at bedtime.         . isosorbide mononitrate (IMDUR) 30 MG 24 hr tablet   Oral   Take 30 mg by mouth at bedtime.         Marland Kitchen lisinopril (PRINIVIL,ZESTRIL) 20 MG tablet   Oral   Take 20 mg by mouth 2 (two) times daily.         . metoprolol (LOPRESSOR) 50 MG tablet   Oral   Take 50 mg by mouth 2 (two) times daily.         Marland Kitchen triamterene-hydrochlorothiazide (DYAZIDE) 37.5-25 MG per capsule   Oral   Take 1 capsule by mouth daily.          BP 174/90  Pulse 68  Temp(Src) 98 F (36.7  C) (Oral)  Resp 16  SpO2 99%  LMP 08/25/2012 Physical Exam  Nursing note and vitals reviewed. Constitutional: She appears well-developed and well-nourished. No distress.  HENT:  Head: Normocephalic and atraumatic.  Mouth/Throat: Oropharynx is clear and moist. No oropharyngeal exudate.  Eyes: Conjunctivae and EOM are normal. Pupils are equal, round, and reactive to light. Right eye exhibits no discharge. Left eye exhibits no discharge. No scleral icterus.  Neck: Normal range of motion. Neck supple. No JVD present. No thyromegaly present.  Cardiovascular: Normal rate, regular rhythm, normal heart sounds and intact distal pulses.  Exam reveals no gallop and no friction rub.   No murmur heard. Pulmonary/Chest: Effort normal and breath sounds normal. No respiratory distress. She has no wheezes. She has no rales.  Abdominal: Soft. Bowel sounds are normal. She exhibits no  distension and no mass. There is no tenderness.  Musculoskeletal: Normal range of motion. She exhibits no edema and no tenderness.  Lymphadenopathy:    She has no cervical adenopathy.  Neurological: She is alert. Coordination normal.  Skin: Skin is warm and dry. No rash noted. No erythema.  Psychiatric: She has a normal mood and affect. Her behavior is normal.    ED Course   Procedures (including critical care time)  Labs Reviewed  CBC WITH DIFFERENTIAL - Abnormal; Notable for the following:    Eosinophils Relative 6 (*)    All other components within normal limits  POCT I-STAT, CHEM 8 - Abnormal; Notable for the following:    Glucose, Bld 102 (*)    All other components within normal limits  POCT I-STAT TROPONIN I   Dg Chest Port 1 View  08/30/2012   *RADIOLOGY REPORT*  Clinical Data: Chest pain  PORTABLE CHEST - 1 VIEW  Comparison: 08/17/2011  Findings: Cardiomediastinal silhouette is unremarkable.  No acute infiltrate or pleural effusion.  No pulmonary edema.  Bony thorax is stable.  IMPRESSION: No active disease.  No significant change.   Original Report Authenticated By: Lahoma Crocker, M.D.   1. Chest pain     MDM  The patient has an abnormal EKG with ST and T wave abnormalities in the lateral leads though to be fair compared to prior EKG from 2013 aVL is no different, lead 1 as new ST and T wave abnormalities. We'll order a troponin however I feel that the patient likely need admission due to her risk factors, prior history of coronary disease and waxing and waning pain throughout the day which I cannot adequately rule out as unstable angina or an acute coronary syndrome. She has been given a full complement of aspirin prior to arrival  ED ECG REPORT  I personally interpreted this EKG   Date: 08/30/2012   Rate: 64  Rhythm: normal sinus rhythm  QRS Axis: normal  Intervals: normal  ST/T Wave abnormalities: nonspecific ST/T changes  Conduction Disutrbances:none  Narrative  Interpretation:   Old EKG Reviewed: changes noted - reviewed EKG from the urgent care as well showing similar lateral changes  Trop neg, VS with mild htn, d/w Dr. Hal Hope - will admit for observation.  Johnna Acosta, MD 08/30/12 2225

## 2012-08-31 ENCOUNTER — Encounter (HOSPITAL_COMMUNITY): Payer: Self-pay | Admitting: Internal Medicine

## 2012-08-31 DIAGNOSIS — R0781 Pleurodynia: Secondary | ICD-10-CM | POA: Diagnosis present

## 2012-08-31 DIAGNOSIS — R079 Chest pain, unspecified: Secondary | ICD-10-CM

## 2012-08-31 DIAGNOSIS — E785 Hyperlipidemia, unspecified: Secondary | ICD-10-CM

## 2012-08-31 DIAGNOSIS — Z8249 Family history of ischemic heart disease and other diseases of the circulatory system: Secondary | ICD-10-CM

## 2012-08-31 DIAGNOSIS — E8881 Metabolic syndrome: Secondary | ICD-10-CM

## 2012-08-31 DIAGNOSIS — I2510 Atherosclerotic heart disease of native coronary artery without angina pectoris: Secondary | ICD-10-CM

## 2012-08-31 DIAGNOSIS — I209 Angina pectoris, unspecified: Secondary | ICD-10-CM | POA: Diagnosis present

## 2012-08-31 DIAGNOSIS — I200 Unstable angina: Secondary | ICD-10-CM

## 2012-08-31 DIAGNOSIS — E669 Obesity, unspecified: Secondary | ICD-10-CM | POA: Diagnosis present

## 2012-08-31 DIAGNOSIS — I10 Essential (primary) hypertension: Secondary | ICD-10-CM

## 2012-08-31 LAB — CBC
HCT: 35.9 % — ABNORMAL LOW (ref 36.0–46.0)
Hemoglobin: 12.1 g/dL (ref 12.0–15.0)
WBC: 9.2 10*3/uL (ref 4.0–10.5)

## 2012-08-31 LAB — LIPASE, BLOOD: Lipase: 43 U/L (ref 11–59)

## 2012-08-31 LAB — HEPATIC FUNCTION PANEL
ALT: 83 U/L — ABNORMAL HIGH (ref 0–35)
AST: 70 U/L — ABNORMAL HIGH (ref 0–37)
Albumin: 3.2 g/dL — ABNORMAL LOW (ref 3.5–5.2)
Alkaline Phosphatase: 39 U/L (ref 39–117)
Bilirubin, Direct: <0.1 mg/dL (ref 0.0–0.3)
Total Bilirubin: 0.2 mg/dL — ABNORMAL LOW (ref 0.3–1.2)
Total Protein: 6.6 g/dL (ref 6.0–8.3)

## 2012-08-31 LAB — BASIC METABOLIC PANEL
Chloride: 101 mEq/L (ref 96–112)
GFR calc Af Amer: 89 mL/min — ABNORMAL LOW (ref >90)
GFR calc non Af Amer: 76 mL/min — ABNORMAL LOW (ref >90)
Potassium: 3.4 mEq/L — ABNORMAL LOW (ref 3.5–5.1)
Sodium: 136 mEq/L (ref 135–145)

## 2012-08-31 LAB — TROPONIN I
Troponin I: <0.3 ng/mL (ref <0.30)
Troponin I: <0.3 ng/mL (ref <0.30)

## 2012-08-31 LAB — AMYLASE: Amylase: 37 U/L (ref 0–105)

## 2012-08-31 MED ORDER — ONDANSETRON HCL 4 MG/2ML IJ SOLN
4.0000 mg | Freq: Four times a day (QID) | INTRAMUSCULAR | Status: DC | PRN
  Administered 2012-09-01: 4 mg via INTRAVENOUS
  Filled 2012-08-31: qty 2

## 2012-08-31 MED ORDER — ONDANSETRON HCL 4 MG PO TABS: 4.0000 mg | ORAL_TABLET | Freq: Four times a day (QID) | ORAL | Status: DC | PRN

## 2012-08-31 MED ORDER — ACETAMINOPHEN 325 MG PO TABS
650.0000 mg | ORAL_TABLET | Freq: Four times a day (QID) | ORAL | Status: DC | PRN
  Administered 2012-08-31: 650 mg via ORAL
  Filled 2012-08-31: qty 2

## 2012-08-31 MED ORDER — LISINOPRIL 20 MG PO TABS
20.0000 mg | ORAL_TABLET | Freq: Two times a day (BID) | ORAL | Status: DC
  Administered 2012-09-02: 20 mg via ORAL
  Filled 2012-08-31 (×2): qty 1

## 2012-08-31 MED ORDER — ENOXAPARIN SODIUM 40 MG/0.4ML ~~LOC~~ SOLN
40.0000 mg | SUBCUTANEOUS | Status: DC
  Administered 2012-09-02: 07:00:00 40 mg via SUBCUTANEOUS
  Filled 2012-08-31 (×3): qty 0.4

## 2012-08-31 MED ORDER — AMLODIPINE BESYLATE 10 MG PO TABS
10.0000 mg | ORAL_TABLET | Freq: Every evening | ORAL | Status: DC
  Administered 2012-08-31: 10 mg via ORAL
  Filled 2012-08-31 (×4): qty 1

## 2012-08-31 MED ORDER — SODIUM CHLORIDE 0.9 % IV SOLN
INTRAVENOUS | Status: DC
  Administered 2012-08-31: 21:00:00 via INTRAVENOUS

## 2012-08-31 MED ORDER — TRIAMTERENE-HCTZ 37.5-25 MG PO CAPS
1.0000 | ORAL_CAPSULE | Freq: Every day | ORAL | Status: DC
  Administered 2012-09-02: 1 via ORAL
  Filled 2012-08-31: qty 1

## 2012-08-31 MED ORDER — ISOSORBIDE MONONITRATE ER 30 MG PO TB24
30.0000 mg | ORAL_TABLET | Freq: Every day | ORAL | Status: DC
  Administered 2012-08-31 – 2012-09-01 (×2): 30 mg via ORAL
  Filled 2012-08-31 (×5): qty 1

## 2012-08-31 MED ORDER — LISINOPRIL 20 MG PO TABS
20.0000 mg | ORAL_TABLET | Freq: Two times a day (BID) | ORAL | Status: DC
  Administered 2012-08-31: 20 mg via ORAL
  Filled 2012-08-31 (×3): qty 1

## 2012-08-31 MED ORDER — TRIAMTERENE-HCTZ 37.5-25 MG PO CAPS
1.0000 | ORAL_CAPSULE | Freq: Every day | ORAL | Status: DC
  Administered 2012-08-31: 1 via ORAL
  Filled 2012-08-31: qty 1

## 2012-08-31 MED ORDER — NITROGLYCERIN 0.4 MG SL SUBL: 0.4000 mg | SUBLINGUAL_TABLET | SUBLINGUAL | Status: DC | PRN

## 2012-08-31 MED ORDER — ASPIRIN EC 325 MG PO TBEC
325.0000 mg | DELAYED_RELEASE_TABLET | Freq: Every day | ORAL | Status: DC
  Administered 2012-08-31 – 2012-09-02 (×3): 325 mg via ORAL
  Filled 2012-08-31 (×3): qty 1

## 2012-08-31 MED ORDER — SODIUM CHLORIDE 0.9 % IJ SOLN
3.0000 mL | Freq: Two times a day (BID) | INTRAMUSCULAR | Status: DC
  Administered 2012-08-31 – 2012-09-01 (×3): 3 mL via INTRAVENOUS

## 2012-08-31 MED ORDER — SODIUM CHLORIDE 0.9 % IJ SOLN
3.0000 mL | Freq: Two times a day (BID) | INTRAMUSCULAR | Status: DC
  Administered 2012-08-31 – 2012-09-01 (×3): 3 mL via INTRAVENOUS

## 2012-08-31 MED ORDER — METOPROLOL TARTRATE 50 MG PO TABS
50.0000 mg | ORAL_TABLET | Freq: Two times a day (BID) | ORAL | Status: DC
  Administered 2012-08-31 – 2012-09-02 (×5): 50 mg via ORAL
  Filled 2012-08-31 (×4): qty 1
  Filled 2012-08-31: qty 2
  Filled 2012-08-31 (×3): qty 1

## 2012-08-31 MED ORDER — ACETAMINOPHEN 650 MG RE SUPP: 650.0000 mg | Freq: Four times a day (QID) | RECTAL | Status: DC | PRN

## 2012-08-31 MED ORDER — ENOXAPARIN SODIUM 40 MG/0.4ML ~~LOC~~ SOLN
40.0000 mg | SUBCUTANEOUS | Status: DC
  Administered 2012-08-31: 40 mg via SUBCUTANEOUS
  Filled 2012-08-31: qty 0.4

## 2012-08-31 NOTE — Progress Notes (Signed)
Utilization review completed.  

## 2012-08-31 NOTE — Consult Note (Signed)
Reason for Consult: Chest pain  Requesting Physician: Mary Sella  HPI: This is a 52 y.o. obese female, no Primary Care provider,  with a past medical history significant for CAD by cath in 2010 and in April 2013. She had an abnormal Myoview suggesting anterior ischemia prior to her repeat cath in 2013. Cath has shown 60% LAD stenosis, 50% mid RCA stenosis, 70%, distal RCA stenosis with normal LV function. She has not seen [place] as an OP.      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 She denies vomiting, SOB, or diaphoresis. She denies any abdominal pain or bloating. She went to Urgent Care and was told they could not rule out a heart attack based on her EKG. She was transferred to [facility] ER by ambulance. NTG helped her symptoms en route and she has not had recurrence of chest pain. Her EKG here is normal with NSST changes and her Troponin is negative X 3.  PMHx:  Past Medical History  Diagnosis Date  . Angina   . Hypertension   . Unstable angina 04/15/2011  . CAD (coronary artery disease), non obstructive CAD in 2010 04/15/2011  . Abnormal stress test, Lexiscan myoview 03/25/2011-sm. anterior ischemia 04/15/2011  . HTN (hypertension) 04/15/2011  . Dyslipidemia 04/15/2011  . Metabolic syndrome 99991111  . Tobacco abuse 04/15/2011  . Family history of premature CAD 04/15/2011  . Reflux    Past Surgical History  Procedure Laterality Date  . Appendectomy    . Cardiac catheterization  2010  . Appendectomy    . Wrist fracture surgery      right    FAMHx: Family History  Problem Relation Age of Onset  . Stroke Father   . Coronary artery disease Brother     SOCHx: Smokes a pack a day. Divorced, raising her cousin's child. 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   ALLERGIES: Allergies  Allergen Reactions  . Simvastatin     myalgias  . Wellbutrin [Bupropion Hcl]     unknown  . Zetia [Ezetimibe]     myalgias     ROS: Pertinent items are noted in HPI. Refer to H&P fo further details. She denies any bleeding. No nausea or vomiting. She has never had a sleep study.   HOME MEDICATIONS: Prescriptions prior to admission  Medication Sig Dispense Refill  . amLODipine (NORVASC) 10 MG tablet Take 10 mg by mouth every evening.      Marland Kitchen aspirin 81 MG tablet Take 81 mg by mouth at bedtime.      . isosorbide mononitrate (IMDUR) 30 MG 24 hr tablet Take 30 mg by mouth at bedtime.      Marland Kitchen lisinopril (PRINIVIL,ZESTRIL) 20 MG tablet Take 20 mg by mouth 2 (two) times daily.      . metoprolol (LOPRESSOR) 50 MG tablet Take 50 mg by mouth 2 (two) times daily.      Marland Kitchen triamterene-hydrochlorothiazide (DYAZIDE) 37.5-25 MG per capsule Take 1 capsule by mouth daily.        HOSPITAL MEDICATIONS: I have reviewed the patient's current medications.  VITALS: Blood pressure 147/65, pulse 69, temperature 97.8 F (36.6 C), temperature source Oral, resp. rate 18, height 5\' 4"  (1.626 m), weight 220 lb 3.2 oz (99.882 kg), last menstrual period 08/25/2012, SpO2 95.00%.  PHYSICAL EXAM: General appearance: alert, cooperative, no distress and morbidly obese Neck: no carotid bruit and no JVD Lungs: clear to auscultation bilaterally Heart: regular  rate and rhythm Abdomen: obese, non tender Extremities: extremities normal, atraumatic, no cyanosis or edema Pulses: 2+ and symmetric Skin: Skin color, texture, turgor normal. No rashes or lesions Neurologic: Grossly normal  LABS: Results for orders placed during the hospital encounter of 08/30/12 (from the past 48 hour(s))  CBC WITH DIFFERENTIAL     Status: Abnormal   Collection Time    08/30/12  8:02 PM      Result Value Range   WBC 9.0  4.0 - 10.5 K/uL   RBC 4.60  3.87 - 5.11 MIL/uL   Hemoglobin 12.9  12.0 - 15.0 g/dL   HCT 38.1  36.0 - 46.0 %   MCV 82.8  78.0 - 100.0 fL   MCH 28.0  26.0 - 34.0 pg   MCHC 33.9  30.0 - 36.0 g/dL   RDW 14.7  11.5 - 15.5 %   Platelets 263  150 -  400 K/uL   Neutrophils Relative % 55  43 - 77 %   Neutro Abs 4.9  1.7 - 7.7 K/uL   Lymphocytes Relative 31  12 - 46 %   Lymphs Abs 2.7  0.7 - 4.0 K/uL   Monocytes Relative 8  3 - 12 %   Monocytes Absolute 0.7  0.1 - 1.0 K/uL   Eosinophils Relative 6 (*) 0 - 5 %   Eosinophils Absolute 0.5  0.0 - 0.7 K/uL   Basophils Relative 1  0 - 1 %   Basophils Absolute 0.1  0.0 - 0.1 K/uL  POCT I-STAT TROPONIN I     Status: None   Collection Time    08/30/12  8:44 PM      Result Value Range   Troponin i, poc 0.00  0.00 - 0.08 ng/mL   Comment 3            Comment: Due to the release kinetics of cTnI,     a negative result within the first hours     of the onset of symptoms does not rule out     myocardial infarction with certainty.     If myocardial infarction is still suspected,     repeat the test at appropriate intervals.  POCT I-STAT, CHEM 8     Status: Abnormal   Collection Time    08/30/12  8:45 PM      Result Value Range   Sodium 138  135 - 145 mEq/L   Potassium 3.9  3.5 - 5.1 mEq/L   Chloride 103  96 - 112 mEq/L   BUN 14  6 - 23 mg/dL   Creatinine, Ser 1.00  0.50 - 1.10 mg/dL   Glucose, Bld 102 (*) 70 - 99 mg/dL   Calcium, Ion 1.15  1.12 - 1.23 mmol/L   TCO2 25  0 - 100 mmol/L   Hemoglobin 13.6  12.0 - 15.0 g/dL   HCT 40.0  36.0 - 46.0 %  TROPONIN I     Status: None   Collection Time    08/31/12  1:40 AM      Result Value Range   Troponin I <0.30  <0.30 ng/mL   Comment:            Due to the release kinetics of cTnI,     a negative result within the first hours     of the onset of symptoms does not rule out     myocardial infarction with certainty.     If myocardial infarction is still suspected,  repeat the test at appropriate intervals.  BASIC METABOLIC PANEL     Status: Abnormal   Collection Time    08/31/12  1:40 AM      Result Value Range   Sodium 136  135 - 145 mEq/L   Potassium 3.4 (*) 3.5 - 5.1 mEq/L   Chloride 101  96 - 112 mEq/L   CO2 23  19 - 32 mEq/L    Glucose, Bld 106 (*) 70 - 99 mg/dL   BUN 15  6 - 23 mg/dL   Creatinine, Ser 0.87  0.50 - 1.10 mg/dL   Calcium 8.4  8.4 - 10.5 mg/dL   GFR calc non Af Amer 76 (*) >90 mL/min   GFR calc Af Amer 89 (*) >90 mL/min   Comment: (NOTE)     The eGFR has been calculated using the CKD EPI equation.     This calculation has not been validated in all clinical situations.     eGFR's persistently <90 mL/min signify possible Chronic Kidney     Disease.  CBC     Status: Abnormal   Collection Time    08/31/12  1:40 AM      Result Value Range   WBC 9.2  4.0 - 10.5 K/uL   RBC 4.31  3.87 - 5.11 MIL/uL   Hemoglobin 12.1  12.0 - 15.0 g/dL   HCT 35.9 (*) 36.0 - 46.0 %   MCV 83.3  78.0 - 100.0 fL   MCH 28.1  26.0 - 34.0 pg   MCHC 33.7  30.0 - 36.0 g/dL   RDW 14.6  11.5 - 15.5 %   Platelets 236  150 - 400 K/uL  TROPONIN I     Status: None   Collection Time    08/31/12  7:50 AM      Result Value Range   Troponin I <0.30  <0.30 ng/mL   Comment:            Due to the release kinetics of cTnI,     a negative result within the first hours     of the onset of symptoms does not rule out     myocardial infarction with certainty.     If myocardial infarction is still suspected,     repeat the test at appropriate intervals.    EKG:   IMAGING: Dg Chest Port 1 View  08/30/2012   *RADIOLOGY REPORT*  Clinical Data: Chest pain  PORTABLE CHEST - 1 VIEW  Comparison: 08/17/2011  Findings: Cardiomediastinal silhouette is unremarkable.  No acute infiltrate or pleural effusion.  No pulmonary edema.  Bony thorax is stable.  IMPRESSION: No active disease.  No significant change.   Original Report Authenticated By: Lahoma Crocker, M.D.    IMPRESSION: Principal Problem:   Chest pain Active Problems:   Unstable angina, most likely GI with stable CAD by cath 04/16/11   CAD (coronary artery disease), non obstructive CAD in 2010, no change with cath 04/16/2011   HTN (hypertension)   Dyslipidemia   Metabolic syndrome   Tobacco  abuse   Abnormal stress test, Lexiscan myoview 03/25/2011-sm. anterior ischemia   Family history of premature CAD   Obesity (BMI 30-39.9)   RECOMMENDATION: Will discuss with MD. Difficult situation with moderate CAD, negative Troponin's and known abnormal Myoview leading to her last cath. ? If her symptoms are GI. Check LFTs (past history of elevated LFTs prompting an abd [place] and CT in 2002), check  Amylase and Lipase with next Troponin.  Continue Nitrate, Lopressor, Norvasc, ASA, and HTN medications. Add PPI.  Time Spent Directly with Patient: 45 minutes  Erlene Quan [id number] beeper 08/31/2012, 11:16 AM  Agree with note written by Kerin Ransom PAC  Known CAD, + CRF, admitted with C/P worrisome for [place]. Enz neg. EKG w/o acute changes. Exam benign. Plan radial cath tomorrow with Dr. Ellyn Hack.   Lorretta Harp 08/31/2012 5:58 PM

## 2012-08-31 NOTE — H&P (Signed)
[facility] History and Physical  SOMMAR BARBIE [id number] DOB: 1960/08/04 DOA: 08/30/2012  Referring physician: ER physician. PCP: No primary provider on file.  Specialists: [facility].  Chief Complaint: Chest pain.  HPI: [name] is a 52 y.o. female with history of hypertension and nonobstructive CAD has had cardiac catheter in 2010 which showed a 60% LAD lesion presents with complaints of chest pain. Patient started experiencing chest pain yesterday around 1 PM which lasted up to 6 PM. Pain to resolve after nitroglycerin sublingual. Pain was substernal nonradiating no associated diaphoresis shortness of breath. Presently patient is chest pain-free. Cardiac markers EKG chest x-ray were unremarkable. Patient otherwise denies any vomiting abdominal pain diarrhea fever chills.  Review of Systems: As presented in the history of presenting illness, rest negative.  Past Medical History  Diagnosis Date  . Angina   . Hypertension   . Unstable angina 04/15/2011  . CAD (coronary artery disease), non obstructive CAD in 2010 04/15/2011  . Abnormal stress test, Lexiscan myoview 03/25/2011-sm. anterior ischemia 04/15/2011  . HTN (hypertension) 04/15/2011  . Dyslipidemia 04/15/2011  . Metabolic syndrome [id number]  . Tobacco abuse 04/15/2011  . Family history of premature CAD 04/15/2011  . Reflux    Past Surgical History  Procedure Laterality Date  . Appendectomy    . Cardiac catheterization  2010  . Appendectomy    . Wrist fracture surgery      right   Social History:  reports that she has been smoking Cigarettes.  She has a 30 pack-year smoking history. She does not have any smokeless tobacco history on file. She reports that she does not drink alcohol or use illicit drugs. Home. where does patient live-- Can do ADLs. Can patient participate in ADLs?  Allergies  Allergen Reactions  . Simvastatin     myalgias  . Wellbutrin [Bupropion Hcl]     unknown  .  Zetia [Ezetimibe]     myalgias    Family History  Problem Relation Age of Onset  . Stroke Father   . Coronary artery disease Brother       Prior to Admission medications   Medication Sig Start Date End Date Taking? Authorizing Provider  amLODipine (NORVASC) 10 MG tablet Take 10 mg by mouth every evening.   Yes Historical Provider, MD  aspirin 81 MG tablet Take 81 mg by mouth at bedtime.   Yes Historical Provider, MD  isosorbide mononitrate (IMDUR) 30 MG 24 hr tablet Take 30 mg by mouth at bedtime.   Yes Historical Provider, MD  lisinopril (PRINIVIL,ZESTRIL) 20 MG tablet Take 20 mg by mouth 2 (two) times daily.   Yes Historical Provider, MD  metoprolol (LOPRESSOR) 50 MG tablet Take 50 mg by mouth 2 (two) times daily.   Yes Historical Provider, MD  triamterene-hydrochlorothiazide (DYAZIDE) 37.5-25 MG per capsule Take 1 capsule by mouth daily.   Yes Historical Provider, MD   Physical Exam: Filed Vitals:   08/30/12 2145 08/30/12 2215 08/30/12 2230 08/30/12 2321  BP: 146/59 161/76 158/71 160/79  Pulse: 67 62 68 67  Temp:    97.5 F (36.4 C)  TempSrc:    Oral  Resp:    18  Height:    5\' 4"  (1.626 m)  Weight:    99.882 kg (220 lb 3.2 oz)  SpO2: 96% 98% 98% 96%     General:  Well-developed and nourished.  Eyes: Anicteric no pallor.  ENT: No discharge from the ears eyes nose mouth.  Neck:  No mass felt.  Cardiovascular: S1-S2 heard.  Respiratory: No rhonchi or crepitations.  Abdomen: Soft nontender bowel sounds present.  Skin: No rash.  Musculoskeletal:  No edema.  Psychiatric: Appears normal.  Neurologic: Alert awake oriented to time place and person. Moves all extremities.  Labs on Admission:  Basic Metabolic Panel:  Recent Labs Lab 08/30/12 2045  NA 138  K 3.9  CL 103  GLUCOSE 102*  BUN 14  CREATININE 1.00   Liver Function Tests: No results found for this basename: AST, ALT, ALKPHOS, BILITOT, PROT, ALBUMIN,  in the last 168 hours No results found for  this basename: LIPASE, AMYLASE,  in the last 168 hours No results found for this basename: AMMONIA,  in the last 168 hours CBC:  Recent Labs Lab 08/30/12 2002 08/30/12 2045  WBC 9.0  --   NEUTROABS 4.9  --   HGB 12.9 13.6  HCT 38.1 40.0  MCV 82.8  --   PLT 263  --    Cardiac Enzymes: No results found for this basename: CKTOTAL, CKMB, CKMBINDEX, TROPONINI,  in the last 168 hours  BNP (last 3 results) No results found for this basename: PROBNP,  in the last 8760 hours CBG: No results found for this basename: GLUCAP,  in the last 168 hours  Radiological Exams on Admission: Dg Chest Port 1 View  08/30/2012   *RADIOLOGY REPORT*  Clinical Data: Chest pain  PORTABLE CHEST - 1 VIEW  Comparison: 08/17/2011  Findings: Cardiomediastinal silhouette is unremarkable.  No acute infiltrate or pleural effusion.  No pulmonary edema.  Bony thorax is stable.  IMPRESSION: No active disease.  No significant change.   Original Report Authenticated By: Lahoma Crocker, M.D.    EKG: Independently reviewed. Normal sinus rhythm.  Assessment/Plan Principal Problem:   Chest pain Active Problems:   HTN (hypertension)   Tobacco abuse   1. Chest pain - Presently chest pain-free. Cycle cardiac markers. Aspirin. When necessary nitroglycerin. [facility] has been listed as a consult. 2. Hypertension  - continue home medications.  3.   Tobacco abuse  - strongly advised to quit smoking.     Code Status: Full code.   Family Communication: None.   Disposition Plan: Admit for observation.    Anthonee Gelin N. [facility] Pager [phone].   If 7PM-7AM, please contact night-coverage [email or web address] Password [facility] 08/31/2012, 12:44 AM

## 2012-09-01 ENCOUNTER — Encounter (HOSPITAL_COMMUNITY)
Admission: EM | Disposition: A | Discharge status: Home / Self Care | Payer: Self-pay | Source: Home / Self Care | Attending: Emergency Medicine

## 2012-09-01 DIAGNOSIS — I2510 Atherosclerotic heart disease of native coronary artery without angina pectoris: Secondary | ICD-10-CM

## 2012-09-01 HISTORY — PX: LEFT HEART CATHETERIZATION WITH CORONARY ANGIOGRAM: SHX5451

## 2012-09-01 LAB — CBC
HCT: 34.9 % — ABNORMAL LOW (ref 36.0–46.0)
Hemoglobin: 11.7 g/dL — ABNORMAL LOW (ref 12.0–15.0)
MCH: 27.8 pg (ref 26.0–34.0)
MCHC: 33.5 g/dL (ref 30.0–36.0)
MCV: 82.9 fL (ref 78.0–100.0)
Platelets: 236 10*3/uL (ref 150–400)
RBC: 4.21 MIL/uL (ref 3.87–5.11)
RDW: 14.7 % (ref 11.5–15.5)
WBC: 7.1 10*3/uL (ref 4.0–10.5)

## 2012-09-01 LAB — BASIC METABOLIC PANEL
BUN: 15 mg/dL (ref 6–23)
CO2: 24 mEq/L (ref 19–32)
Calcium: 8.4 mg/dL (ref 8.4–10.5)
Chloride: 102 mEq/L (ref 96–112)
Creatinine, Ser: 0.9 mg/dL (ref 0.50–1.10)
GFR calc Af Amer: 85 mL/min — ABNORMAL LOW (ref >90)
GFR calc non Af Amer: 73 mL/min — ABNORMAL LOW (ref >90)
Glucose, Bld: 106 mg/dL — ABNORMAL HIGH (ref 70–99)
Potassium: 3.6 mEq/L (ref 3.5–5.1)
Sodium: 137 mEq/L (ref 135–145)

## 2012-09-01 LAB — PROTIME-INR
INR: 1.07 (ref 0.00–1.49)
Prothrombin Time: 13.7 seconds (ref 11.6–15.2)

## 2012-09-01 SURGERY — LEFT HEART CATHETERIZATION WITH CORONARY ANGIOGRAM
Anesthesia: LOCAL

## 2012-09-01 MED ORDER — SODIUM CHLORIDE 0.9 % IJ SOLN: 3.0000 mL | Freq: Two times a day (BID) | INTRAMUSCULAR | Status: DC

## 2012-09-01 MED ORDER — HEPARIN SODIUM (PORCINE) 1000 UNIT/ML IJ SOLN
INTRAMUSCULAR | Status: AC
  Filled 2012-09-01: qty 1

## 2012-09-01 MED ORDER — HYDRALAZINE HCL 20 MG/ML IJ SOLN: 10.0000 mg | Freq: Once | INTRAMUSCULAR | Status: DC

## 2012-09-01 MED ORDER — FENTANYL CITRATE 0.05 MG/ML IJ SOLN
INTRAMUSCULAR | Status: AC
  Filled 2012-09-01: qty 2

## 2012-09-01 MED ORDER — SODIUM CHLORIDE 0.9 % IV SOLN: 250.0000 mL | INTRAVENOUS | Status: DC | PRN

## 2012-09-01 MED ORDER — HYDRALAZINE HCL 20 MG/ML IJ SOLN
INTRAMUSCULAR | Status: AC
  Filled 2012-09-01: qty 1

## 2012-09-01 MED ORDER — LIDOCAINE HCL (PF) 1 % IJ SOLN
INTRAMUSCULAR | Status: AC
  Filled 2012-09-01: qty 30

## 2012-09-01 MED ORDER — ACETAMINOPHEN 325 MG PO TABS
650.0000 mg | ORAL_TABLET | ORAL | Status: DC | PRN
  Administered 2012-09-02: 08:00:00 650 mg via ORAL
  Filled 2012-09-01: qty 2

## 2012-09-01 MED ORDER — MIDAZOLAM HCL 2 MG/2ML IJ SOLN
INTRAMUSCULAR | Status: AC
  Filled 2012-09-01: qty 2

## 2012-09-01 MED ORDER — NITROGLYCERIN 0.2 MG/ML ON CALL CATH LAB
INTRAVENOUS | Status: AC
  Filled 2012-09-01: qty 1

## 2012-09-01 MED ORDER — HEPARIN (PORCINE) IN NACL 2-0.9 UNIT/ML-% IJ SOLN
INTRAMUSCULAR | Status: AC
  Filled 2012-09-01: qty 1000

## 2012-09-01 MED ORDER — ONDANSETRON HCL 4 MG/2ML IJ SOLN: 4.0000 mg | Freq: Four times a day (QID) | INTRAMUSCULAR | Status: DC | PRN

## 2012-09-01 MED ORDER — DIAZEPAM 5 MG PO TABS
5.0000 mg | ORAL_TABLET | ORAL | Status: AC
  Administered 2012-09-01: 5 mg via ORAL
  Filled 2012-09-01: qty 1

## 2012-09-01 MED ORDER — SODIUM CHLORIDE 0.9 % IJ SOLN: 3.0000 mL | INTRAMUSCULAR | Status: DC | PRN

## 2012-09-01 MED ORDER — SODIUM CHLORIDE 0.9 % IV SOLN: 1.0000 mL/kg/h | INTRAVENOUS | Status: AC

## 2012-09-01 MED ORDER — VERAPAMIL HCL 2.5 MG/ML IV SOLN
INTRAVENOUS | Status: AC
  Filled 2012-09-01: qty 2

## 2012-09-01 MED ORDER — HYDRALAZINE HCL 20 MG/ML IJ SOLN: 10.0000 mg | INTRAMUSCULAR | Status: DC | PRN

## 2012-09-01 NOTE — Progress Notes (Signed)
Called to the bedside when hematoma was noted in the right groin. She had been on the bedpan, suspect the groin became unstable. Direct pressure was held by the cath lab staff prior to my arrival for 15 minutes. The groin site does not appear to be bleeding but there is a large hematoma. She has no groin pain and a good distal pulse.  Will re-check H/H overnight and CBC/BMET in the am.  Will give additional hydralazine for high bp. Place catheter to reduce leg movement.  Pixie Casino, MD, [facility] Attending Cardiologist The [place]

## 2012-09-01 NOTE — Progress Notes (Signed)
Called to 6C08 to assist with groin management. Took over manual pressure of rfa site, hematoma present. Pressure was held manually for 5 minutes prior to my arrival. I held manual pressure over site and hematoma area for an additional 20 minutes. Hematoma remains, size 10cm by 18cm extending to mid axial area of the thigh, below the groin area. Hematoma area traced with skin marker and tegaderm dressing applied. Bedrest instructions given. Dr. Debara Pickett present assessing site.

## 2012-09-01 NOTE — Progress Notes (Signed)
Patient received from cath lab at 1749 right groin site level 0 gauze pressure dressing dry and intact. +2 right dorsalis pedis. Patient on bedpan at 1830 with complaints of feeling needles at groin site. Right groin assessment unchanged, area soft, gauze pressure dressing dry and intact, right dorsalis pedis +2. Discussed possibility of lidocaine wearing off causing numbness and tingling, patient stated that is how it felt, like numbness wearing off. Planned to recheck groin after she came off the bedpan. Called into room by Donna Christen RN who was holding pressure. Large hematoma noted. Called Ellen Henri PA and Dr. Debara Pickett to report finding and request their assessment. Cath lab called for assist in holding pressure and Bryan (Kermit) Quentin Cornwall came up to assist in holding pressure. See notes by Donna Christen and Sherlyn Lick. Patient assessed by Dr. Debara Pickett and Ellen Henri PA, orders received. Hydralazine given IV at 1910 to control blood pressure, 167/80 at 1900 and 141/70 at 1915. Foley placed as ordered at [place]. Patient resting quietly at 1930. Right groin level 3 and no change from marked area. Right dorsalis pedis +2. Patient denies any pain at this time. "Only hurts when you push on it." Blood pressure 107/69 at 1930.

## 2012-09-01 NOTE — Progress Notes (Signed)
Pt. Seen and examined. Agree with the NP/PA-C note as written.  Plan LHC today.  Pixie Casino, MD, [facility] Attending Cardiologist The [place]

## 2012-09-01 NOTE — H&P (Signed)
   INTERVAL PROCEDURE H&P  History and Physical Interval Note:  09/01/2012 3:26 PM  [name] has presented today for their planned procedure. The various methods of treatment have been discussed with the patient and family. After consideration of risks, benefits and other options for treatment, the patient has consented to the procedure.  The patients' outpatient history has been reviewed, patient examined, and no change in status from most recent office note within the past 30 days. I have reviewed the patients' chart and labs and will proceed as planned. Questions were answered to the patient's satisfaction.   Pixie Casino, MD, [facility] Attending Cardiologist The [place] C 09/01/2012, 3:26 PM

## 2012-09-01 NOTE — Progress Notes (Signed)
Assisted patient off bed pan check post cath site to right groin it was positive for large hematoma level 3 I started holding pressure at 1840 meanwhile had called for assistance to manage groin to help hold pressure due to size patient c/o nausea and hot heart rate 40-50's. Samul Dada) RN came in to assist as well as Oncologist. I gave zofran pt stayed stabled. MD was paged to assess the site Dr. Debara Pickett came and Later Dr. Ellyn Hack. Patient remains stable foley placed to avoid movement and will stay on bedrest groin level 3. Please see Progress notes from [facility] for further details.

## 2012-09-01 NOTE — CV Procedure (Signed)
CARDIAC CATHETERIZATION REPORT  [name]   [id number] 01-21-1960  Performing Cardiologist: Pixie Casino Primary Physician: No primary provider on file. Primary Cardiologist:  Dr. Gwenlyn Found  Procedures Performed:  Left Heart Catheterization via 5 Fr right femoral artery access  Left Ventriculography, (RAO/LAO) 15 ml/sec for 30 ml total contrast  Native Coronary Angiography  Indication(s): chest pain  Pre-Procedural Diagnosis: Unstable angina Post-Procedural Diagnosis: Non-cardiac chest pain vs. Possibly chest pain secondary to uncontrolled HTN  Pre-Procedural Non-invasive testing: Abnormal, suggestive of ischemia  History: 52 y.o. female presented with a past medical history significant for CAD by cath in 2010 and in April 2013. She had an abnormal Myoview suggesting anterior ischemia prior to her repeat cath in 2013. Cath has shown 60% LAD stenosis, 50% mid RCA stenosis, 70%, distal RCA stenosis with normal LV function. She has not seen [place] as an OP. 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 She denies vomiting, SOB, or diaphoresis. She denies any abdominal pain or bloating. She went to Urgent Care and was told they could not rule out a heart attack based on her EKG. She was transferred to [facility] ER by ambulance. NTG helped her symptoms en route and she has not had recurrence of chest pain. Her EKG here is normal with NSST changes and her Troponin is negative X 3. She is again referred for LHC.   Risks / Complications include, but not limited to: Death, MI, CVA/TIA, VF/VT (with defibrillation), Bradycardia (need for temporary pacer placement), contrast induced nephropathy, bleeding / bruising / hematoma / pseudoaneurysm, vascular or coronary injury (with possible emergent CT or Vascular Surgery), adverse medication reactions, infection.    Consent: Risks of procedure as well as the alternatives and risks of each  were explained to the (patient/caregiver).  Consent for procedure obtained.  Procedure: The patient was brought to the 2nd [facility]iac Catheterization Lab in the fasting state and prepped and draped in the usual sterile fashion for (Right groin or radial) access. A modified Allen's test with plethysmography was performed on the right wrist demonstrating adequate Ulnar Artery collateral flow.    Time Out: Verified patient identification, verified procedure, site/side was marked, verified correct patient position, special equipment/implants available, radiation safety measures in place (including badges and shielding), medications/allergies/relevent history reviewed, required imaging and test results available.  Performed  Procedure: The right wrist was anesthetized with 1% subcutaneous Lidocaine.  The right radial artery was accessed using the Seldinger Technique with placement of a 6 Fr Glide Sheath. The sheath was advanced about 3/4 of the way into the artery when resistance was met. The sheath was aspirated and flushed, however no blood return was noted. The sheath was withdrawn some with blood return. A wire was advanced up the sheath and could not be advanced much past the end of the sheath. The vessel was clearly spasming.  At this point, I made the decision to abadon the radial approach and a TR band was placed and the artery was secured. Reverse Allen's test did  reveal non-occlusive hemostasis.  Attention was then turned to the groin. The right femoral head was identified using tactile and fluoroscopic technique.  The right groin was anesthetized with 1% subcutaneous Lidocaine.  The right Common Femoral Artery was accessed using the Modified Seldinger Technique after some difficulty with an ultrasound needle and assistance from my colleague Dr. Ellyn Hack. A (5 Fr) sheath was placed using the Seldinger technique.  The sheath was aspirated and flushed.  A 5 Fr JL4 Catheter was advanced of  over a Standard J wire into the ascending Aorta.  The catheter was used to engage the left coronary artery.  Multiple cineangiographic views of the left coronary artery system(s) were performed. A 5 Fr JR4 Catheter was advanced of over a Safety J wire into the ascending Aorta.  The catheter was used to engage the right coronary artery.  Multiple cineangiographic views of the right coronary artery system(s) were performed. This catheter was then exchanged over the Standard J wire for an angled Pigtail catheter that was advanced across the Aortic Valve.  LV hemodynamics were measured (and Left Ventriculography was performed).  LV hemodynamics were then re-sampled, and the catheter was pulled back across the Aortic Valve for measurement of "pull-back" gradient.  The catheter and the wire was removed completely out of the body. A femoral angiogram was performed using hand injection do demonstrate patency and placement of the femoral sheath prior to removal. The patient was transferred to the holding area where the sheath was removed with manual pressure held for hemostasis.   Recovery: The patient was transported to the cath lab holding area in stable condition.   The patient  was stable before, during and following the procedure.   Patient did tolerate procedure well. There were not complications.  EBL: Minimal  Medications:  Premedication: none  Sedation:  2 mg IV Versed, 25 mcg IV Fentanyl  Contrast:  67 ml Omnipaque  Local Anesthesia: 5 cc 1% lidocaine  5 cc radial cocktail  Hemodynamics:  Central Aortic Pressure / Mean Aortic Pressure: 190/95  LV Pressure / LV End diastolic Pressure:  17  Left Ventriculography:  EF:  60-65%  Wall Motion: Hyperdynamic  MR: None  Coronary Angiographic Data:  Left Main:  Angiographically normal  Left Anterior Descending (LAD):  Mild luminal irregularities distally - extends to the apex.  1st diagonal (D1):  Large branch, free of stenosis   Circumflex  (LCx):  Large branch with no significant stenoses.  1st obtuse marginal:  Tiny branch 2nd obtuse marginal:  Moderate sized branch, no stenosis   Right Coronary Artery: Dominant. There is 60% mid-distal RCA stenosis at the genu - this appears stable compared to 2013.  PDA/PLB: No stenoses.    Impression: 1.  60% mid-distal RCA stenosis -stable. No other obstructive CAD. 2.  LVEDP = 17 mmHg 3.  Blood pressure was significantly elevated.  Plan: 1.  Improved blood pressure control may help her symptoms. 2.  Monitor overnight due to difficult access for possible bleeding complications. 3.  Anticipate d/c in the am on increased BP meds.  The case and results was discussed with the patient and family if available.  The case and results was not discussed with the patient's PCP. The case and results was not discussed with the patient's Cardiologist.  Time Spent Directly with the Patient:  75 minutes  Pixie Casino, MD, [facility] Attending Cardiologist The [place] C 09/01/2012, 5:45 PM

## 2012-09-01 NOTE — Progress Notes (Signed)
Subjective: No further chest pain or SOB.    Objective: Vital signs in last 24 hours: Temp:  [97.9 F (36.6 C)-98.4 F (36.9 C)] 98.4 F (36.9 C) (08/22 0641) Pulse Rate:  [60-69] 60 (08/22 0641) Resp:  [14-18] 18 (08/22 0641) BP: (147-171)/(65-87) 151/74 mmHg (08/22 0641) SpO2:  [94 %-98 %] 95 % (08/22 0641) Weight change:  Last BM Date: 08/30/12 Intake/Output from previous day: +1080 08/21 0701 - 08/22 0700 In: 1080 [P.O.:1080] Out: -  Intake/Output this shift:    PE: General:Pleasant affect, NAD Skin:Warm and dry, brisk capillary refill HEENT:normocephalic, sclera clear, mucus membranes moist Neck:supple, no JVD, no bruits  Heart:S1S2 RRR without murmur, gallup, rub or click Lungs:clear without rales, rhonchi, or wheezes [id number], non tender, + BS, do not palpate liver spleen or masses Ext:no lower ext edema, 2+ pedal pulses, 2+ radial pulses Neuro:alert and oriented, MAE, follows commands, + facial symmetry   Lab Results:  Recent Labs  08/31/12 0140 09/01/12 0550  WBC 9.2 7.1  HGB 12.1 11.7*  HCT 35.9* 34.9*  PLT 236 236   BMET  Recent Labs  08/31/12 0140 09/01/12 0550  NA 136 137  K 3.4* 3.6  CL 101 102  CO2 23 24  GLUCOSE 106* 106*  BUN 15 15  CREATININE 0.87 0.90  CALCIUM 8.4 8.4    Recent Labs  08/31/12 0750 08/31/12 1315  TROPONINI <0.30 <0.30    Lab Results  Component Value Date   CHOL 126 04/16/2011   HDL 26* 04/16/2011   LDLCALC 62 04/16/2011   TRIG 191* 04/16/2011   CHOLHDL 4.8 04/16/2011   Lab Results  Component Value Date   HGBA1C 6.1* 04/15/2011     Lab Results  Component Value Date   TSH 1.405 04/15/2011    Hepatic Function Panel  Recent Labs  08/31/12 1118  PROT 6.6  ALBUMIN 3.2*  AST 70*  ALT 83*  ALKPHOS 39  BILITOT 0.2*  BILIDIR <0.1  IBILI NOT CALCULATED    Studies/Results: Dg Chest Port 1 View  08/30/2012   *RADIOLOGY REPORT*  Clinical Data: Chest pain  PORTABLE CHEST - 1 VIEW  Comparison: 08/17/2011   Findings: Cardiomediastinal silhouette is unremarkable.  No acute infiltrate or pleural effusion.  No pulmonary edema.  Bony thorax is stable.  IMPRESSION: No active disease.  No significant change.   Original Report Authenticated By: Lahoma Crocker, M.D.    Medications: I have reviewed the patient's current medications. Scheduled Meds: . amLODipine  10 mg Oral QPM  . aspirin EC  325 mg Oral Daily  . diazepam  5 mg Oral On Call  . [START ON 09/02/2012] enoxaparin (LOVENOX) injection  40 mg Subcutaneous Q24H  . isosorbide mononitrate  30 mg Oral QHS  . [START ON 09/02/2012] lisinopril  20 mg Oral BID  . metoprolol  50 mg Oral BID  . sodium chloride  3 mL Intravenous Q12H  . sodium chloride  3 mL Intravenous Q12H  . [START ON 09/02/2012] triamterene-hydrochlorothiazide  1 capsule Oral Daily   Continuous Infusions: . sodium chloride 75 mL/hr at 08/31/12 2118   PRN Meds:.acetaminophen, acetaminophen, nitroGLYCERIN, ondansetron (ZOFRAN) IV, ondansetron  Assessment/Plan: Principal Problem:   Chest pain Active Problems:   Unstable angina, most likely GI with stable CAD by cath 04/16/11   CAD (coronary artery disease), non obstructive CAD in 2010, no change with cath 04/16/2011   Abnormal stress test, Lexiscan myoview 03/25/2011-sm. anterior ischemia   Family history of premature CAD  Dyslipidemia   Tobacco abuse   HTN (hypertension)   Metabolic syndrome   Obesity (BMI 30-39.9)   Renal cyst- on CT in 2002  PLAN:abnormal AST, negative amylase, lipase, negative MI.  Hx of fatty liver several years ago.   For cardiac cath today.  LOS: 2 days   Time spent with pt. :15 minutes. [facility] R  Nurse Practitioner Certified Pager [phone] 09/01/2012, 9:33 AM

## 2012-09-02 DIAGNOSIS — S301XXA Contusion of abdominal wall, initial encounter: Secondary | ICD-10-CM

## 2012-09-02 LAB — CBC
Hemoglobin: 10.6 g/dL — ABNORMAL LOW (ref 12.0–15.0)
MCHC: 33.3 g/dL (ref 30.0–36.0)
RDW: 15.1 % (ref 11.5–15.5)

## 2012-09-02 LAB — BASIC METABOLIC PANEL
GFR calc Af Amer: 63 mL/min — ABNORMAL LOW (ref >90)
GFR calc non Af Amer: 55 mL/min — ABNORMAL LOW (ref >90)
Potassium: 3.9 mEq/L (ref 3.5–5.1)
Sodium: 138 mEq/L (ref 135–145)

## 2012-09-02 LAB — HEMOGLOBIN AND HEMATOCRIT, BLOOD: HCT: 31.9 % — ABNORMAL LOW (ref 36.0–46.0)

## 2012-09-02 MED ORDER — NITROGLYCERIN 0.4 MG SL SUBL: 0.4000 mg | SUBLINGUAL_TABLET | SUBLINGUAL | Status: DC | PRN

## 2012-09-02 NOTE — Progress Notes (Signed)
[facility]  Subjective: No further chest pain. Denies right groin, back or flank pain.   Objective: Vital signs in last 24 hours: Temp:  [97.6 F (36.4 C)-98.4 F (36.9 C)] 98.4 F (36.9 C) (08/23 0700) Pulse Rate:  [52-79] 65 (08/23 0700) Resp:  [15-18] 18 (08/23 0700) BP: (107-198)/(52-100) 136/66 mmHg (08/23 0700) SpO2:  [93 %-99 %] 95 % (08/23 0700) Weight:  [220 lb 14.4 oz (100.2 kg)] 220 lb 14.4 oz (100.2 kg) (08/23 0531) Last BM Date: 09/01/12  Intake/Output from previous day: 08/22 0701 - 08/23 0700 In: 1454.6 [P.O.:600; I.V.:854.6] Out: 1600 [Urine:1600] Intake/Output this shift:    Medications Current Facility-Administered Medications  Medication Dose Route Frequency Provider Last Rate Last Dose  . 0.9 %  sodium chloride infusion  250 mL Intravenous PRN Pixie Casino, MD      . acetaminophen (TYLENOL) suppository 650 mg  650 mg Rectal Q6H PRN Rise Patience, MD      . acetaminophen (TYLENOL) tablet 650 mg  650 mg Oral Q4H PRN Pixie Casino, MD   650 mg at 09/02/12 0731  . amLODipine (NORVASC) tablet 10 mg  10 mg Oral QPM Rise Patience, MD   10 mg at 08/31/12 1812  . aspirin EC tablet 325 mg  325 mg Oral Daily Rise Patience, MD   325 mg at 09/01/12 0856  . enoxaparin (LOVENOX) injection 40 mg  40 mg Subcutaneous Q24H Erlene Quan, PA-C   40 mg at 09/02/12 [id number]  . hydrALAZINE (APRESOLINE) injection 10 mg  10 mg Intravenous Once [employer], PA-C      . hydrALAZINE (APRESOLINE) injection 10 mg  10 mg Intravenous Q4H PRN Brittainy Simmons, PA-C      . isosorbide mononitrate (IMDUR) 24 hr tablet 30 mg  30 mg Oral QHS Rise Patience, MD   30 mg at 09/01/12 2149  . lisinopril (PRINIVIL,ZESTRIL) tablet 20 mg  20 mg Oral BID Doreene Burke Kilroy, PA-C      . metoprolol (LOPRESSOR) tablet 50 mg  50 mg Oral BID Rise Patience, MD   50 mg at 09/01/12 2150  . nitroGLYCERIN (NITROSTAT) SL tablet 0.4 mg  0.4 mg  Sublingual Q5 min PRN Rise Patience, MD      . ondansetron [facility]) injection 4 mg  4 mg Intravenous Q6H PRN Pixie Casino, MD      . ondansetron [facility]) tablet 4 mg  4 mg Oral Q6H PRN Rise Patience, MD      . sodium chloride 0.9 % injection 3 mL  3 mL Intravenous Q12H Rise Patience, MD   3 mL at 09/01/12 2148  . sodium chloride 0.9 % injection 3 mL  3 mL Intravenous Q12H Rise Patience, MD   3 mL at 09/01/12 2149  . sodium chloride 0.9 % injection 3 mL  3 mL Intravenous Q12H Pixie Casino, MD      . sodium chloride 0.9 % injection 3 mL  3 mL Intravenous PRN Pixie Casino, MD      . triamterene-hydrochlorothiazide (DYAZIDE) 37.5-25 MG per capsule 1 capsule  1 capsule Oral Daily Erlene Quan, PA-C        PE: General appearance: alert, cooperative and no distress Lungs: clear to auscultation bilaterally Heart: regular rate and rhythm, S1, S2 normal, no murmur, click, rub or gallop Extremities: no LEE, right groin: mild ecchymosis, soft-firm, non-tender, slight increase in size beyond original markings,  no bruit,  Pulses: 2+ and symmetric Skin: warm and dry Neurologic: Grossly normal  Lab Results:   Recent Labs  08/31/12 0140 09/01/12 0550 09/02/12 0035 09/02/12 0610  WBC 9.2 7.1  --  8.5  HGB 12.1 11.7* 10.8* 10.6*  HCT 35.9* 34.9* 31.9* 31.8*  PLT 236 236  --  249   BMET  Recent Labs  08/31/12 0140 09/01/12 0550 09/02/12 0610  NA 136 137 138  K 3.4* 3.6 3.9  CL 101 102 104  CO2 23 24 25   GLUCOSE 106* 106* 113*  BUN 15 15 22   CREATININE 0.87 0.90 1.15*  CALCIUM 8.4 8.4 8.6   PT/INR  Recent Labs  09/01/12 0550  LABPROT 13.7  INR 1.07    Studies/Results:  LHC 09/01/12 Impression:  1. 60% mid-distal RCA stenosis -stable. No other obstructive CAD.  2. LVEDP = 17 mmHg  3. Blood pressure was significantly elevated.  Assessment/Plan    Principal Problem:   Chest pain Active Problems:   Unstable angina, most likely GI with  stable CAD by cath 04/16/11   CAD (coronary artery disease), non obstructive CAD in 2010, no change with cath 04/16/2011   Abnormal stress test, Lexiscan myoview 03/25/2011-sm. anterior ischemia   HTN (hypertension)   Dyslipidemia   Metabolic syndrome   Tobacco abuse   Family history of premature CAD   Obesity (BMI 30-39.9)   Renal cyst- on CT in 2002  Plan: S/p diagnostic LHC yesterday via the right femoral artery revealing 60% mid-distal RCA stenosis (stable), but no other obstructive CAD. Normal LV function with an EF of 60-65%. No further chest pain. Post-op, patient noted to have a large right groin hematoma. Pressure was held. No hemodynamic compromise. She has had only slight serial decreases in H/H and it appears to be stable with H/H of 10.6/31.8. She is normotensive with BP of 136/66. She denies groin, back and flank pain. Groin site remains stable and little change in size since last PM. Will have ambulate in halls with RN this AM. If still stable will discharge home today.    LOS: 3 days    Brittainy M. Ladoris Gene 09/02/2012 8:19 AM

## 2012-09-02 NOTE — Progress Notes (Signed)
Utilization Review completed.  

## 2012-09-02 NOTE — Discharge Summary (Signed)
Physician Discharge Summary  Patient ID: [name] MRN: [id number] DOB/AGE: [birth year] 52 y.o.  Admit date: 08/30/2012 Discharge date: 09/02/2012  Admission Diagnoses: Chest Pain  Discharge Diagnoses:  Principal Problem:   Chest pain Active Problems:   Unstable angina, most likely GI with stable CAD by cath 04/16/11   CAD (coronary artery disease), non obstructive CAD in 2010, no change with cath 09/01/12   Abnormal stress test, Lexiscan myoview 03/25/2011-sm. anterior ischemia   HTN (hypertension)   Dyslipidemia   Metabolic syndrome   Tobacco abuse   Family history of premature CAD   Obesity (BMI 30-39.9)   Renal cyst- on CT in 2002   Hematoma of groin - right, s/p cath; stable on discharge   Discharged Condition: stable  Hospital Course: The patient is a 52 y.o. female with a history of hypertension and nonobstructive CAD, s/p cardiac catheterization in 2010 which showed a 60% RCA lesion, who presented to [facility] on 08/30[facility]12 with complaints of chest pain. Her pain was concerning for angina. It was relieved in the ER with SL NTG. Initial EKG, troponin and CXR were all unremarkable. However, she was admitted for MI rule out. Cardiac enzymes were cycled and negative x 3. Due to her history of mild RCA disease, a diagnostic LHC was recommended to re-assess if there was progression of disease. The procedure was performed by Dr.Hilty. An attempt was made initially to gain access via the right radial artery, however this was unsuccessful. Access was archived via the right femoral artery. The cath revealed a 60% mid-distal RCA stenosis (unchanged from prior cath) and no other obstructive CAD. She had normal LV function with an EF of 60-65%. She left the cath lab in stable condition. She was transferred to the post-cath observation unit for further monitoring and hydration. Post-cath, she developed a large right groin hematoma. She denied pain. Pressure was applied to the area. There was no  hemodynamic compromise. She was moderately hypertensive and BP control was achieved with IV hydralazine. Her H/H was trended overnight and remained stable, as well as BP. The following day, she was reassessed. The right groin hemartoma remained stable w/o bruit. She ambulated the halls without pain and difficulty. She also continued to deny further chest pain. She was last seen and examined by Dr. Debara Pickett, who determined that she was stable for discharge home. He has ordered for her to stay out of work for 1 week. The patient was provided a work excuse. She will follow up with a MLP in 1-2 weeks for hospital follow-up, then will resume further cardiovascular care with Dr. Gwenlyn Found.    Consults: None  Significant Diagnostic Studies: angiography: cardiac Hemodynamics:  Central Aortic Pressure / Mean Aortic Pressure: 190/95  LV Pressure / LV End diastolic Pressure: 17  Left Ventriculography:  EF: 60-65%  Wall Motion: Hyperdynamic  MR: None  Coronary Angiographic Data:  Left Main: Angiographically normal  Left Anterior Descending (LAD): Mild luminal irregularities distally - extends to the apex.  1st diagonal (D1): Large branch, free of stenosis Circumflex (LCx): Large branch with no significant stenoses.  1st obtuse marginal: Tiny branch 2nd obtuse marginal: Moderate sized branch, no stenosis Right Coronary Artery: Dominant. There is 60% mid-distal RCA stenosis at the genu - this appears stable compared to 2013.  PDA/PLB: No stenoses. Impression:  1. 60% mid-distal RCA stenosis -stable. No other obstructive CAD.  2. LVEDP = 17 mmHg  3. Blood pressure was significantly elevated   Treatments: See Hospital Course  Discharge  Exam: Blood pressure 136/66, pulse 65, temperature 98.4 F (36.9 C), temperature source Oral, resp. rate 18, height 5\' 4"  (1.626 m), weight 220 lb 14.4 oz (100.2 kg), last menstrual period 08/25/2012, SpO2 95.00%.   Disposition: 01-Home or Self Care      Discharge  Orders   Future Orders Complete By Expires   Diet - low sodium heart healthy  As directed    Discharge instructions  As directed    Comments:     Contact [facility] if you notice any bleeding near the groin or intense groin or low back pain   Driving Restrictions  As directed    Comments:     No driving for 3 days   Increase activity slowly  As directed    Lifting restrictions  As directed    Comments:     No lifting more than 1/2 gallon of milk for 3 days       Medication List         amLODipine 10 MG tablet  Commonly known as:  NORVASC  Take 10 mg by mouth every evening.     aspirin 81 MG tablet  Take 81 mg by mouth at bedtime.     isosorbide mononitrate 30 MG 24 hr tablet  Commonly known as:  IMDUR  Take 30 mg by mouth at bedtime.     lisinopril 20 MG tablet  Commonly known as:  PRINIVIL,ZESTRIL  Take 20 mg by mouth 2 (two) times daily.     metoprolol 50 MG tablet  Commonly known as:  LOPRESSOR  Take 50 mg by mouth 2 (two) times daily.     nitroGLYCERIN 0.4 MG SL tablet  Commonly known as:  NITROSTAT  Place 1 tablet (0.4 mg total) under the tongue every 5 (five) minutes as needed for chest pain.     triamterene-hydrochlorothiazide 37.5-25 MG per capsule  Commonly known as:  DYAZIDE  Take 1 capsule by mouth daily.       TIME SPENT ON DISCHARGE, INCLUDING PHYSICIAN TIME: >30 MINTUES  Signed: Consuelo Pandy, PA-C 09/02/2012, 11:11 AM

## 2012-09-02 NOTE — Progress Notes (Signed)
Pt. Seen and examined. Agree with the NP/PA-C note as written.  No further chest discomfort. Blood pressure improved today. Hematoma of the right groin is stable. No bruit. H/H decreased and stabilized overnight, only minimally. She ambulated today without pain. [place] for discharge home. Follow-up with MLP in 1-2 weeks and ultimately with Dr. Gwenlyn Found (who consulted on her - she was a former [place] patient).  Pixie Casino, MD, [facility] Attending Cardiologist [facility] [place]

## 2012-09-04 ENCOUNTER — Telehealth: Payer: Self-pay | Admitting: Cardiology

## 2012-09-04 NOTE — Telephone Encounter (Addendum)
Pt discharged from hospital on Saturday.  [name] wrote note for her to return to work on 09/12/12.  She wants to know if she can go back on Wednesday 09/06/12.

## 2012-09-05 ENCOUNTER — Encounter: Payer: Self-pay | Admitting: Cardiology

## 2012-09-05 ENCOUNTER — Ambulatory Visit (INDEPENDENT_AMBULATORY_CARE_PROVIDER_SITE_OTHER): Payer: Medicaid Other | Admitting: Cardiology

## 2012-09-05 VITALS — BP 122/70 | HR 64 | Ht 64.0 in | Wt 223.5 lb

## 2012-09-05 DIAGNOSIS — E669 Obesity, unspecified: Secondary | ICD-10-CM

## 2012-09-05 DIAGNOSIS — S301XXD Contusion of abdominal wall, subsequent encounter: Secondary | ICD-10-CM

## 2012-09-05 DIAGNOSIS — Z5189 Encounter for other specified aftercare: Secondary | ICD-10-CM

## 2012-09-05 DIAGNOSIS — E785 Hyperlipidemia, unspecified: Secondary | ICD-10-CM

## 2012-09-05 DIAGNOSIS — I2510 Atherosclerotic heart disease of native coronary artery without angina pectoris: Secondary | ICD-10-CM

## 2012-09-05 DIAGNOSIS — Z720 Tobacco use: Secondary | ICD-10-CM

## 2012-09-05 DIAGNOSIS — S3012XD Contusion of groin, subsequent encounter: Secondary | ICD-10-CM

## 2012-09-05 DIAGNOSIS — F17200 Nicotine dependence, unspecified, uncomplicated: Secondary | ICD-10-CM

## 2012-09-05 DIAGNOSIS — I10 Essential (primary) hypertension: Secondary | ICD-10-CM

## 2012-09-05 MED ORDER — NIACIN ER 500 MG PO TBCR: 500.0000 mg | EXTENDED_RELEASE_TABLET | Freq: Every day | ORAL | Status: DC

## 2012-09-05 NOTE — Assessment & Plan Note (Addendum)
Prior statin intol.

## 2012-09-05 NOTE — Patient Instructions (Addendum)
Take Niacin slow release, 500 mg at night. Take with a snack and you aspirin. See Dr Gwenlyn Found in 2 months

## 2012-09-05 NOTE — Assessment & Plan Note (Signed)
Quit 09/01/12

## 2012-09-05 NOTE — Assessment & Plan Note (Signed)
She asked for diet information

## 2012-09-05 NOTE — Assessment & Plan Note (Signed)
No chest pain

## 2012-09-05 NOTE — Assessment & Plan Note (Signed)
Hematoma but no bruit noted

## 2012-09-05 NOTE — Telephone Encounter (Signed)
Reviewed discharge note.  Pt needs to be seen before that can be determined.    Returned call and pt informed.  Pt verbalized understanding and agreed w/ plan.  Appt rescheduled for today at 11:20am.

## 2012-09-05 NOTE — Assessment & Plan Note (Signed)
controlled 

## 2012-09-05 NOTE — Progress Notes (Signed)
09/05/2012 [name]   April 27, 1960  [id number]  Primary Physicia No PCP Per Patient Primary Cardiologist: Dr Gwenlyn Found  HPI:  This is a 53 y.o. obese female, no Primary Care provider, with a past medical history significant for CAD by cath in 2010 and in April 2013. She had an abnormal Myoview suggesting anterior ischemia prior to her repeat cath in 2013. Cath has shown 60% LAD stenosis, 50% mid RCA stenosis, 70%, distal RCA stenosis with normal LV function.           She was admitted 08/31/12 with chest pain worrisome for angina and we saw her in consult. Cath done 09/01/12 showed 60% RCA narrowing. He was hypertensive. She did develop a hematoma post cath. She is seen today for follow up. She has not had any recurrent chest pain and her B/P has been controlled. She has not smoked since discharge. Her Rt groin is "sore".     Current Outpatient Prescriptions  Medication Sig Dispense Refill  . amLODipine (NORVASC) 10 MG tablet Take 10 mg by mouth every evening.      Marland Kitchen aspirin 81 MG tablet Take 81 mg by mouth at bedtime.      . isosorbide mononitrate (IMDUR) 30 MG 24 hr tablet Take 30 mg by mouth at bedtime.      Marland Kitchen lisinopril (PRINIVIL,ZESTRIL) 20 MG tablet Take 20 mg by mouth 2 (two) times daily.      . metoprolol (LOPRESSOR) 50 MG tablet Take 50 mg by mouth 2 (two) times daily.      . nitroGLYCERIN (NITROSTAT) 0.4 MG SL tablet Place 1 tablet (0.4 mg total) under the tongue every 5 (five) minutes as needed for chest pain.  25 tablet  2  . triamterene-hydrochlorothiazide (DYAZIDE) 37.5-25 MG per capsule Take 1 capsule by mouth daily.       No current facility-administered medications for this visit.    Allergies  Allergen Reactions  . Bupropion     Other reaction(s): Other Suicidal thoughts and increased depression  . Simvastatin     myalgias  . Wellbutrin [Bupropion Hcl]     unknown  . Zetia [Ezetimibe]     myalgias    History   Social History  . Marital Status: Divorced   Spouse Name: N/A    Number of Children: N/A  . Years of Education: N/A   Occupational History  . Not on file.   Social History Main Topics  . Smoking status: Current Every Day Smoker -- 1.00 packs/day for 30 years    Types: Cigarettes  . Smokeless tobacco: Not on file  . Alcohol Use: No  . Drug Use: No  . Sexual Activity: No   Other Topics Concern  . Not on file   Social History Narrative  . No narrative on file     Review of Systems: General: negative for chills, fever, night sweats or weight changes.  Cardiovascular: negative for chest pain, dyspnea on exertion, edema, orthopnea, palpitations, paroxysmal nocturnal dyspnea or shortness of breath Dermatological: negative for rash Respiratory: negative for cough or wheezing Urologic: negative for hematuria Abdominal: negative for nausea, vomiting, diarrhea, bright red blood per rectum, melena, or hematemesis Neurologic: negative for visual changes, syncope, or dizziness All other systems reviewed and are otherwise negative except as noted above.    Blood pressure 122/70, pulse 64, height 5\' 4"  (1.626 m), weight 223 lb 8 oz (101.379 kg), last menstrual period 08/25/2012.  General appearance: alert, cooperative, no distress and moderately obese Lungs:  clear to auscultation bilaterally Heart: regular rate and rhythm, S1, S2 normal, no murmur, click, rub or gallop Extremities: Rt groin ecchymotic, hematoma noted but no bruit    ASSESSMENT AND PLAN:   CAD (coronary artery disease), non obstructive CAD in 2010, no change with cath 09/01/12 No chest pain  Tobacco abuse Quit 09/01/12  Obesity (BMI 30-39.9) She asked for diet information  Dyslipidemia- low HDL Prior statin intol.  HTN (hypertension) controlled  Hematoma of groin - right, s/p cath; stable on discharge Hematoma but no bruit noted   PLAN  I congratulated her on not smoking. I am going to add OTC Niacin 500 mg. We discussed wgt loss and she does not  feel she can afford a dietitian.Marland Kitchen  She will follow up with Dr Gwenlyn Found in 2 months.  Clothilde Tippetts KPA-C 09/05/2012 11:58 AM

## 2012-09-15 ENCOUNTER — Ambulatory Visit: Payer: Medicaid Other | Admitting: Cardiology

## 2012-10-16 ENCOUNTER — Other Ambulatory Visit: Payer: Self-pay | Admitting: *Deleted

## 2012-10-16 MED ORDER — METOPROLOL TARTRATE 50 MG PO TABS: 50.0000 mg | ORAL_TABLET | Freq: Two times a day (BID) | ORAL | Status: DC

## 2012-11-08 ENCOUNTER — Ambulatory Visit (INDEPENDENT_AMBULATORY_CARE_PROVIDER_SITE_OTHER): Payer: Medicaid Other | Admitting: Cardiovascular Disease

## 2012-11-08 ENCOUNTER — Encounter: Payer: Self-pay | Admitting: Cardiovascular Disease

## 2012-11-08 VITALS — BP 162/82 | HR 68 | Ht 64.0 in | Wt 224.0 lb

## 2012-11-08 DIAGNOSIS — Z720 Tobacco use: Secondary | ICD-10-CM

## 2012-11-08 DIAGNOSIS — E782 Mixed hyperlipidemia: Secondary | ICD-10-CM

## 2012-11-08 DIAGNOSIS — F17200 Nicotine dependence, unspecified, uncomplicated: Secondary | ICD-10-CM

## 2012-11-08 DIAGNOSIS — I2510 Atherosclerotic heart disease of native coronary artery without angina pectoris: Secondary | ICD-10-CM

## 2012-11-08 DIAGNOSIS — E8881 Metabolic syndrome: Secondary | ICD-10-CM

## 2012-11-08 DIAGNOSIS — I10 Essential (primary) hypertension: Secondary | ICD-10-CM

## 2012-11-08 DIAGNOSIS — E785 Hyperlipidemia, unspecified: Secondary | ICD-10-CM

## 2012-11-08 DIAGNOSIS — Z79899 Other long term (current) drug therapy: Secondary | ICD-10-CM

## 2012-11-08 DIAGNOSIS — Z8249 Family history of ischemic heart disease and other diseases of the circulatory system: Secondary | ICD-10-CM

## 2012-11-08 NOTE — Assessment & Plan Note (Signed)
I talked about the importance of smoking cessation and I recommended the NicoDerm patch

## 2012-11-08 NOTE — Assessment & Plan Note (Signed)
The patient has had multiple cardiac catheterizations performed remotely by Dr. Elisabeth Cara, and 2013 by Dr. Claiborne Billings and most recently by Dr. Debara Pickett all revealing the same moderate but not critical disease. Medical therapy has been recommended. She has had no subsequent chest pain since her heart cath 09/01/12. She is in good and potential medications.

## 2012-11-08 NOTE — Assessment & Plan Note (Signed)
Borderline control on current medications

## 2012-11-08 NOTE — Assessment & Plan Note (Signed)
On statin therapy. We'll recheck a lipid and liver profile 

## 2012-11-08 NOTE — Patient Instructions (Signed)
Your physician wants you to follow-up in: 6 months with an extender and 1 year with Dr Gwenlyn Found.   You will receive a reminder letter in the mail two months in advance. If you don't receive a letter, please call our office to schedule the follow-up appointment.   Dr Gwenlyn Found has requested that you have blood work to check your cholesterol levels.  I have provided you with the lab slip.  Please have this done fasting.    We have referred you to [facility].  She is a nutritionist. Her office will contact you with the information.

## 2012-11-08 NOTE — Progress Notes (Signed)
11/08/2012 [name]   1960-10-03  [id number]  Primary Physician No PCP Per Patient Primary Cardiologist: Madison Harp MD [name]   HPI:  52 y.o. female presented with a past medical history significant for CAD by cath in 2010 and in April 2013. She had an abnormal Myoview suggesting anterior ischemia prior to her repeat cath in 2013. Cath has shown 60% LAD stenosis, 50% mid RCA stenosis, 70%, distal RCA stenosis with normal LV function. She has not seen [place] as an OP. 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 She denies vomiting, SOB, or diaphoresis. She denies any abdominal pain or bloating. She went to Urgent Care and was told they could not rule out a heart attack based on her EKG. She was transferred to [facility] ER by ambulance. NTG helped her symptoms en route and she has not had recurrence of chest pain. Her EKG here is normal with NSST changes and her Troponin is negative X 3. She underwent cardiac catheterization by Dr. Debara Walter on 09/01/12 revealing unchanged anatomy from her prior catheter because of 13 with a 60% RCA lesion and otherwise scattered noncritical disease with normal LV function. Medical therapy was recommended. She has had no recurrent symptoms.    Current Outpatient Prescriptions  Medication Sig Dispense Refill  . amLODipine (NORVASC) 10 MG tablet Take 10 mg by mouth every evening.      Marland Kitchen aspirin 81 MG tablet Take 81 mg by mouth at bedtime.      . isosorbide mononitrate (IMDUR) 30 MG 24 hr tablet Take 30 mg by mouth at bedtime.      Marland Kitchen lisinopril (PRINIVIL,ZESTRIL) 20 MG tablet Take 20 mg by mouth 2 (two) times daily.      . metoprolol (LOPRESSOR) 50 MG tablet Take 1 tablet (50 mg total) by mouth 2 (two) times daily.  60 tablet  11  . niacin (SLO-NIACIN) 500 MG tablet Take 1 tablet (500 mg total) by mouth at bedtime.      . nitroGLYCERIN (NITROSTAT) 0.4 MG SL tablet Place 1 tablet  (0.4 mg total) under the tongue every 5 (five) minutes as needed for chest pain.  25 tablet  2  . rosuvastatin (CRESTOR) 5 MG tablet Take 5 mg by mouth 3 (three) times a week.      . triamterene-hydrochlorothiazide (DYAZIDE) 37.5-25 MG per capsule Take 1 capsule by mouth daily.       No current facility-administered medications for this visit.    Allergies  Allergen Reactions  . Bupropion     Other reaction(s): Other Suicidal thoughts and increased depression  . Simvastatin     myalgias  . Wellbutrin [Bupropion Hcl]     unknown  . Zetia [Ezetimibe]     myalgias    History   Social History  . Marital Status: Divorced    Spouse Name: N/A    Number of Children: N/A  . Years of Education: N/A   Occupational History  . Not on file.   Social History Main Topics  . Smoking status: Current Every Day Smoker -- 1.00 packs/day for 30 years    Types: Cigarettes  . Smokeless tobacco: Not on file  . Alcohol Use: No  . Drug Use: No  . Sexual Activity: No   Other Topics Concern  . Not on file   Social History Narrative  . No narrative on file     Review of Systems:  General: negative for chills, fever, night sweats or weight changes.  Cardiovascular: negative for chest pain, dyspnea on exertion, edema, orthopnea, palpitations, paroxysmal nocturnal dyspnea or shortness of breath Dermatological: negative for rash Respiratory: negative for cough or wheezing Urologic: negative for hematuria Abdominal: negative for nausea, vomiting, diarrhea, bright red blood per rectum, melena, or hematemesis Neurologic: negative for visual changes, syncope, or dizziness All other systems reviewed and are otherwise negative except as noted above.    Blood pressure 162/82, pulse 68, height 5\' 4"  (1.626 m), weight 224 lb (101.606 kg).  General appearance: alert and no distress Neck: no adenopathy, no carotid bruit, no JVD, supple, symmetrical, trachea midline and thyroid not enlarged, symmetric,  no tenderness/mass/nodules Lungs: clear to auscultation bilaterally Heart: regular rate and rhythm, S1, S2 normal, no murmur, click, rub or gallop Extremities: extremities normal, atraumatic, no cyanosis or edema  EKG not performed today  ASSESSMENT AND PLAN:   CAD (coronary artery disease), non obstructive CAD in 2010, no change with cath 09/01/12 The patient has had multiple cardiac catheterizations performed remotely by Dr. Elisabeth Walter, and 2013 by Dr. Claiborne Walter and most recently by Dr. Debara Walter all revealing the same moderate but not critical disease. Medical therapy has been recommended. She has had no subsequent chest pain since her heart cath 09/01/12. She is in good and potential medications.  HTN (hypertension) Borderline control on current medications  Dyslipidemia- low HDL On statin therapy. We'll recheck a lipid and liver profile  Tobacco abuse I talked about the importance of smoking cessation and I recommended the NicoDerm patch      Madison Harp MD [facility], [facility] 11/08/2012 9:44 AM

## 2012-11-16 ENCOUNTER — Other Ambulatory Visit: Payer: Self-pay

## 2012-12-16 ENCOUNTER — Other Ambulatory Visit: Payer: Self-pay | Admitting: Cardiology

## 2012-12-18 NOTE — Telephone Encounter (Signed)
Rx was sent to pharmacy electronically. 

## 2012-12-22 ENCOUNTER — Encounter: Payer: Self-pay | Admitting: Cardiology

## 2013-04-07 ENCOUNTER — Other Ambulatory Visit: Payer: Self-pay | Admitting: Cardiology

## 2013-07-09 ENCOUNTER — Encounter: Payer: Self-pay | Admitting: Family Medicine

## 2013-07-09 ENCOUNTER — Ambulatory Visit (INDEPENDENT_AMBULATORY_CARE_PROVIDER_SITE_OTHER): Payer: Self-pay | Admitting: Family Medicine

## 2013-07-09 VITALS — BP 217/91 | HR 91 | Temp 98.2°F | Resp 18 | Ht 63.5 in | Wt 227.0 lb

## 2013-07-09 DIAGNOSIS — M25552 Pain in left hip: Secondary | ICD-10-CM

## 2013-07-09 DIAGNOSIS — M6580 Other synovitis and tenosynovitis, unspecified site: Secondary | ICD-10-CM

## 2013-07-09 DIAGNOSIS — M25559 Pain in unspecified hip: Secondary | ICD-10-CM

## 2013-07-09 DIAGNOSIS — M76892 Other specified enthesopathies of left lower limb, excluding foot: Secondary | ICD-10-CM

## 2013-07-09 MED ORDER — ACETAMINOPHEN 500 MG PO CAPS
1000.0000 mg | ORAL_CAPSULE | Freq: Once | ORAL | Status: AC
  Administered 2013-07-09: 1000 mg via ORAL

## 2013-07-09 MED ORDER — CYCLOBENZAPRINE HCL 5 MG PO TABS: 5.0000 mg | ORAL_TABLET | Freq: Every day | ORAL | Status: DC

## 2013-07-09 MED ORDER — TRAMADOL HCL 50 MG PO TABS: 50.0000 mg | ORAL_TABLET | Freq: Four times a day (QID) | ORAL | Status: DC | PRN

## 2013-07-09 NOTE — Patient Instructions (Signed)
Hip Exercises RANGE OF MOTION (ROM) AND STRETCHING EXERCISES  These exercises may help you when beginning to rehabilitate your injury. Doing them too aggressively can worsen your condition. Complete them slowly and gently. Your symptoms may resolve with or without further involvement from your physician, physical therapist or athletic trainer. While completing these exercises, remember:   Restoring tissue flexibility helps normal motion to return to the joints. This allows healthier, less painful movement and activity.  An effective stretch should be held for at least 30 seconds.  A stretch should never be painful. You should only feel a gentle lengthening or release in the stretched tissue. If these stretches worsen your symptoms even when done gently, consult your physician, physical therapist or athletic trainer. STRETCH - Hamstrings, Supine   Lie on your back. Loop a belt or towel over the ball of your right / left foot.  Straighten your right / left knee and slowly pull on the belt to raise your leg. Do not allow the right / left knee to bend. Keep your opposite leg flat on the floor.  Raise the leg until you feel a gentle stretch behind your right / left knee or thigh. Hold this position for __________ seconds. Repeat __________ times. Complete this stretch __________ times per day.  STRETCH - Hip Rotators   Lie on your back on a firm surface. Grasp your right / left knee with your right / left hand and your ankle with your opposite hand.  Keeping your hips and shoulders firmly planted, gently pull your right / left knee and rotate your lower leg toward your opposite shoulder until you feel a stretch in your buttocks.  Hold this stretch for __________ seconds. Repeat this stretch __________ times. Complete this stretch __________ times per day. STRETCH - Hamstrings/Adductors, V-Sit   Sit on the floor with your legs extended in a large "V," keeping your knees straight.  With your  head and chest upright, bend at your waist reaching for your right foot to stretch your left adductors.  You should feel a stretch in your left inner thigh. Hold for __________ seconds.  Return to the upright position to relax your leg muscles.  Continuing to keep your chest upright, bend straight forward at your waist to stretch your hamstrings.  You should feel a stretch behind both of your thighs and/or knees. Hold for __________ seconds.  Return to the upright position to relax your leg muscles.  Repeat steps 2 through 4 for opposite leg. Repeat __________ times. Complete this exercise __________ times per day.  STRETCHING - Hip Flexors, Lunge  Half kneel with your right / left knee on the floor and your opposite knee bent and directly over your ankle.  Keep good posture with your head over your shoulders. Tighten your buttocks to point your tailbone downward; this will prevent your back from arching too much.  You should feel a gentle stretch in the front of your thigh and/or hip. If you do not feel any resistance, slightly slide your opposite foot forward and then slowly lunge forward so your knee once again lines up over your ankle. Be sure your tailbone remains pointed downward.  Hold this stretch for __________ seconds. Repeat __________ times. Complete this stretch __________ times per day. STRENGTHENING EXERCISES These exercises may help you when beginning to rehabilitate your injury. They may resolve your symptoms with or without further involvement from your physician, physical therapist or athletic trainer. While completing these exercises, remember:   Muscles  can gain both the endurance and the strength needed for everyday activities through controlled exercises.  Complete these exercises as instructed by your physician, physical therapist or athletic trainer. Progress the resistance and repetitions only as guided.  You may experience muscle soreness or fatigue, but the  pain or discomfort you are trying to eliminate should never worsen during these exercises. If this pain does worsen, stop and make certain you are following the directions exactly. If the pain is still present after adjustments, discontinue the exercise until you can discuss the trouble with your clinician. STRENGTH - Hip Extensors, Bridge   Lie on your back on a firm surface. Bend your knees and place your feet flat on the floor.  Tighten your buttocks muscles and lift your bottom off the floor until your trunk is level with your thighs. You should feel the muscles in your buttocks and back of your thighs working. If you do not feel these muscles, slide your feet 1-2 inches further away from your buttocks.  Hold this position for __________ seconds.  Slowly lower your hips to the starting position and allow your buttock muscles relax completely before beginning the next repetition.  If this exercise is too easy, you may cross your arms over your chest. Repeat __________ times. Complete this exercise __________ times per day.  STRENGTH - Hip Abductors, Straight Leg Raises  Be aware of your form throughout the entire exercise so that you exercise the correct muscles. Sloppy form means that you are not strengthening the correct muscles.  Lie on your side so that your head, shoulders, knee and hip line up. You may bend your lower knee to help maintain your balance. Your right / left leg should be on top.  Roll your hips slightly forward, so that your hips are stacked directly over each other and your right / left knee is facing forward.  Lift your top leg up 4-6 inches, leading with your heel. Be sure that your foot does not drift forward or that your knee does not roll toward the ceiling.  Hold this position for __________ seconds. You should feel the muscles in your outer hip lifting (you may not notice this until your leg begins to tire).  Slowly lower your leg to the starting position. Allow  the muscles to fully relax before beginning the next repetition. Repeat __________ times. Complete this exercise __________ times per day.  STRENGTH - Hip Adductors, Straight Leg Raises   Lie on your side so that your head, shoulders, knee and hip line up. You may place your upper foot in front to help maintain your balance. Your right / left leg should be on the bottom.  Roll your hips slightly forward, so that your hips are stacked directly over each other and your right / left knee is facing forward.  Tense the muscles in your inner thigh and lift your bottom leg 4-6 inches. Hold this position for __________ seconds.  Slowly lower your leg to the starting position. Allow the muscles to fully relax before beginning the next repetition. Repeat __________ times. Complete this exercise __________ times per day.  STRENGTH - Quadriceps, Straight Leg Raises  Quality counts! Watch for signs that the quadriceps muscle is working to insure you are strengthening the correct muscles and not "cheating" by substituting with healthier muscles.  Lay on your back with your right / left leg extended and your opposite knee bent.  Tense the muscles in the front of your right / left  thigh. You should see either your knee cap slide up or increased dimpling just above the knee. Your thigh may even quiver.  Tighten these muscles even more and raise your leg 4 to 6 inches off the floor. Hold for right / left seconds.  Keeping these muscles tense, lower your leg.  Relax the muscles slowly and completely in between each repetition. Repeat __________ times. Complete this exercise __________ times per day.  STRENGTH - Hip Abductors, Standing  Tie one end of a rubber exercise band/tubing to a secure surface (table, pole) and tie a loop at the other end.  Place the loop around your right / left ankle. Keeping your ankle with the band directly opposite of the secured end, step away until there is tension in the  tube/band.  Hold onto a chair as needed for balance.  Keeping your back upright, your shoulders over your hips, and your toes pointing forward, lift your right / left leg out to your side. Be sure to lift your leg with your hip muscles. Do not "throw" your leg or tip your body to lift your leg.  Slowly and with control, return to the starting position. Repeat exercise __________ times. Complete this exercise __________ times per day.  STRENGTH - Quadriceps, Squats  Stand in a door frame so that your feet and knees are in line with the frame.  Use your hands for balance, not support, on the frame.  Slowly lower your weight, bending at the hips and knees. Keep your lower legs upright so that they are parallel with the door frame. Squat only within the range that does not increase your knee pain. Never let your hips drop below your knees.  Slowly return upright, pushing with your legs, not pulling with your hands. Document Released: 01/15/2005 Document Revised: 03/22/2011 Document Reviewed: 04/11/2008 [facility] Patient Information 2015 [place], [place]. This information is not intended to replace advice given to you by your health care provider. Make sure you discuss any questions you have with your health care provider.

## 2013-07-09 NOTE — Progress Notes (Signed)
Subjective:  This chart was scribed for Wardell Honour, MD by Ladene Artist, ED Scribe. The patient was seen in room 23. Patient's care was started at 2:34 PM.   Patient ID: [name], female    DOB: [birth year], 53 y.o.   MRN: [id number]  07/09/2013  Leg Pain and Hip Pain  HPI HPI Comments: [name] is a 53 y.o. female who presents to the [facility] complaining of sharp L shin pain, L ankle pain, and L hip pain onset yesterday. Pt states that she helped a friend move over the past three days. She noticed the pain when they finished up Saturday/first day of moving but states that after a 15 minute drive home, she could hardly get out of the car due to L hip pain. Pt states that pain was better Sunday but flared up again Sunday night (second day of moving). Pt ambulates with a limp due to severity of pain. She denies nausea, vomiting, abdominal pain, urinary symptoms. She also denies fall or trauma. She has applied heat and ice, rested and taken 2 ibuprofen tablets with mild relief. Last dose was today. Pt left work for appointment; pain is quite severe.  No unusual activity with moving but did carry lots of boxes and ambulate up and down stairs frequently for three days.  Denies n/t/w in L leg.  Denies b/b dysfunction or saddle paresthesias.  Pt also has elevated BP/HTN and CAD. BP reading during triage was 217/91 and 210/90 during examination. She denies HA, dizziness, visual disturbances, SOB, leg swelling, chest pain, diaphoresis. Pt last saw cardiologist in August 2014. Pt checks BP at home once a week. She checks her BP in the evenings with average readings of 145/78-82. Pt states that she takes all of her medications as prescribed.  BP reading today is unusual for patient but does admit to a lot of hip pain.  Pt also presents with skin spots that have spread over her body. She reports spots to her thighs, legs and arms.   Pt works in [employer].   Review of  Systems  Eyes: Negative for visual disturbance.  Respiratory: Negative for shortness of breath.   Cardiovascular: Negative for chest pain, palpitations and leg swelling.  Gastrointestinal: Negative for nausea, vomiting and abdominal pain.  Genitourinary: Negative for urgency, frequency and vaginal discharge.  Musculoskeletal: Positive for arthralgias, gait problem and myalgias. Negative for back pain and joint swelling.  Skin: Negative for color change and rash.  Neurological: Negative for dizziness, tremors, seizures, syncope, speech difficulty, weakness, light-headedness, numbness and headaches.  Psychiatric/Behavioral: Positive for agitation (famh).   Past Medical History  Diagnosis Date  . Angina   . Hypertension   . Unstable angina 04/15/2011  . CAD (coronary artery disease), non obstructive CAD in 2010 04/15/2011  . Abnormal stress test, Lexiscan myoview 03/25/2011-sm. anterior ischemia 04/15/2011  . HTN (hypertension) 04/15/2011  . Dyslipidemia 04/15/2011  . Metabolic syndrome 99991111  . Tobacco abuse 04/15/2011  . Family history of premature CAD 04/15/2011  . Reflux   . Palpitations    Past Surgical History  Procedure Laterality Date  . Appendectomy    . Appendectomy    . Wrist fracture surgery      right  . Renal doppler  06/05/2009    No evidence of significant diameter reduction  . Cardiac catheterization  02/07/2008    Recommendation-F/U stress test  . Cardiac catheterization  04/16/2011    Recommendation-Increase medical therapy trial  .  Cardiovascular stress test  03/25/2011    Suspect subtle anterior septal ischemia  . Transthoracic echocardiogram  01/25/2008    EF 60-65%, Moderate concentric LVH    Allergies  Allergen Reactions  . Bupropion     Other reaction(s): Other Suicidal thoughts and increased depression  . Simvastatin     myalgias  . Wellbutrin [Bupropion Hcl]     unknown  . Zetia [Ezetimibe]     myalgias   Current Outpatient Prescriptions  Medication Sig  Dispense Refill  . amLODipine (NORVASC) 10 MG tablet TAKE ONE TABLET BY MOUTH ONCE DAILY  30 tablet  10  . aspirin 81 MG tablet Take 81 mg by mouth at bedtime.      . Inositol Niacinate (NIACIN FLUSH FREE PO) Take by mouth.      . isosorbide mononitrate (IMDUR) 30 MG 24 hr tablet TAKE ONE TABLET BY MOUTH ONCE DAILY  30 tablet  6  . lisinopril (PRINIVIL,ZESTRIL) 20 MG tablet TAKE ONE TABLET BY MOUTH TWICE DAILY  60 tablet  6  . metoprolol (LOPRESSOR) 50 MG tablet Take 1 tablet (50 mg total) by mouth 2 (two) times daily.  60 tablet  11  . nitroGLYCERIN (NITROSTAT) 0.4 MG SL tablet Place 1 tablet (0.4 mg total) under the tongue every 5 (five) minutes as needed for chest pain.  25 tablet  2  . cyclobenzaprine (FLEXERIL) 5 MG tablet Take 1 tablet (5 mg total) by mouth at bedtime.  30 tablet  1  . niacin (SLO-NIACIN) 500 MG tablet Take 1 tablet (500 mg total) by mouth at bedtime.      . rosuvastatin (CRESTOR) 5 MG tablet Take 5 mg by mouth 3 (three) times a week.      . traMADol (ULTRAM) 50 MG tablet Take 1-2 tablets (50-100 mg total) by mouth every 6 (six) hours as needed.  40 tablet  0  . triamterene-hydrochlorothiazide (MAXZIDE-25) 37.5-25 MG per tablet TAKE ONE TABLET BY MOUTH EVERY DAY  30 tablet  10   No current facility-administered medications for this visit.   History   Social History  . Marital Status: Divorced    Spouse Name: N/A    Number of Children: N/A  . Years of Education: N/A   Occupational History  . Not on file.   Social History Main Topics  . Smoking status: Current Every Day Smoker -- 1.00 packs/day for 30 years    Types: Cigarettes  . Smokeless tobacco: Not on file  . Alcohol Use: No  . Drug Use: No  . Sexual Activity: No   Other Topics Concern  . Not on file   Social History Narrative  . No narrative on file   .    Objective:    Triage Vitals: BP 217/91  Pulse 91  Temp(Src) 98.2 F (36.8 C) (Oral)  Resp 18  Ht 5' 3.5" (1.613 m)  Wt 227 lb (102.967  kg)  BMI 39.58 kg/m2  SpO2 97%  LMP 05/21/2013 Physical Exam  Nursing note and vitals reviewed. Constitutional: She is oriented to person, place, and time. She appears well-developed and well-nourished. She appears distressed.  HENT:  Head: Normocephalic and atraumatic.  Eyes: Conjunctivae and EOM are normal. Pupils are equal, round, and reactive to light.  Neck: Neck supple.  Cardiovascular: Normal rate, regular rhythm and normal heart sounds.  Exam reveals no gallop and no friction rub.   No murmur heard. Pulmonary/Chest: Effort normal and breath sounds normal. She has no wheezes. She has no  rales.  Musculoskeletal:       Left hip: She exhibits decreased range of motion, tenderness and bony tenderness. She exhibits normal strength and no swelling.       Left knee: Normal. She exhibits normal range of motion.       Left ankle: Normal. She exhibits normal range of motion and no swelling. Achilles tendon normal.       Lumbar back: Normal. She exhibits normal range of motion, no tenderness, no bony tenderness, no pain and no spasm.       Left lower leg: She exhibits tenderness. She exhibits no bony tenderness.       Left foot: Normal.  Tender to palpation at iliac crest of pelvis. No tenderness along lateral hip along greater trochanter  Mild pain with external rotation; no pain with internal rotation L hip +moderate pain with hip flexion; no pain with hip extension.  Gait with limp.  Neurological: She is alert and oriented to person, place, and time.  Skin: Skin is warm. She is not diaphoretic.  Psychiatric: She has a normal mood and affect. Her behavior is normal.       Assessment & Plan:   1. Left hip pain   2. Tendinitis of left hip flexor    1. L hip pain:  New.  Secondary to strain; rx for Flexeril 5mg  qhs.  Rx for Tramadol 50mg  1-2 every six hours as needed for pain.  Recommend Tylenol as needed.  If pain persists > 2 weeks, RTC for xrays. 2. L flexor hip strain:  New.  Due  to overuse injury while helping friend move; no trauma so xray not warranted at this time.  If pain persists more than two weeks, RTC for xray.  Treat with rest, icing, Flexeril qhs, Tramadol; limit NSAID use due to elevated BP and CAD.  May warrant hydrocodone for pain control but patient requested Tramadol to allow her to work. Home exercise program provided to start in 72 hours or when pain improves. 3. HTN: severe due to pain; treat pain and reevaluate. Asymptomatic; RTC immediately for dizziness, blurred vision, chest pain,SOB, diaphoresis.   Meds ordered this encounter  Medications  . Inositol Niacinate (NIACIN FLUSH FREE PO)    Sig: Take by mouth.  . cyclobenzaprine (FLEXERIL) 5 MG tablet    Sig: Take 1 tablet (5 mg total) by mouth at bedtime.    Dispense:  30 tablet    Refill:  1  . traMADol (ULTRAM) 50 MG tablet    Sig: Take 1-2 tablets (50-100 mg total) by mouth every 6 (six) hours as needed.    Dispense:  40 tablet    Refill:  0  . Acetaminophen CAPS 1,000 mg    Sig:     No Follow-up on file.   I personally performed the services described in this documentation, which was scribed in my presence. The recorded information has been reviewed and is accurate.  Reginia Forts, M.D.  [facility] [address] [place], [place]  [address] [phone] phone [phone] fax

## 2013-07-22 ENCOUNTER — Other Ambulatory Visit: Payer: Self-pay | Admitting: Family Medicine

## 2013-07-24 NOTE — Telephone Encounter (Signed)
Pt states that her BP has been running around 148/90  Her hip pain is a little better. She states that her hip and thigh on the L side will just give on her while walking. She is able to catch herself and does not fall. The constant pain is more in the shin now.

## 2013-07-24 NOTE — Telephone Encounter (Signed)
Please call in refill of Tramadol; I am out of the office until Thursday.  Also, please call patient and see how her L hip pain is doing.  Also, ask what her BP has been running.  Thanks.

## 2013-08-23 ENCOUNTER — Telehealth: Payer: Self-pay | Admitting: Cardiovascular Disease

## 2013-08-23 NOTE — Telephone Encounter (Signed)
Returned call to patient she stated her B/P has been elevated for the past several days.B/P ranging 204/109,183/99,213/97,pulse 77.Stated she is taking medications as prescribed.Also has noticed small area left upper chin numb.No chest pain,no sob.Dr.Berry out of office will check with DOD Dr.Kelly and call back.

## 2013-08-23 NOTE — Telephone Encounter (Signed)
[name] is calling because her blood pressure is running high and she can't seem to get it down. Please call    Thanks

## 2013-08-23 NOTE — Telephone Encounter (Signed)
Returned call to patient spoke to DOD [facility] he advised take a extra Metoprolol 50 mg today.Advised to increase Metoprolol 50 mg 1 1/2 tablets twice a day.Advised needs appointment with Dr.Berry or extender next week.No appointments available.Message sent to Dr.Bery's nurse Brita Romp RN for appointment with Dr.Berry.

## 2013-08-31 ENCOUNTER — Ambulatory Visit (INDEPENDENT_AMBULATORY_CARE_PROVIDER_SITE_OTHER): Payer: Medicaid Other | Admitting: Cardiology

## 2013-08-31 VITALS — BP 168/76 | HR 67 | Ht 64.0 in | Wt 226.0 lb

## 2013-08-31 DIAGNOSIS — E8881 Metabolic syndrome: Secondary | ICD-10-CM

## 2013-08-31 DIAGNOSIS — I10 Essential (primary) hypertension: Secondary | ICD-10-CM

## 2013-08-31 DIAGNOSIS — E785 Hyperlipidemia, unspecified: Secondary | ICD-10-CM

## 2013-08-31 DIAGNOSIS — E669 Obesity, unspecified: Secondary | ICD-10-CM

## 2013-08-31 DIAGNOSIS — Z79899 Other long term (current) drug therapy: Secondary | ICD-10-CM

## 2013-08-31 MED ORDER — HYDRALAZINE HCL 25 MG PO TABS: 25.0000 mg | ORAL_TABLET | Freq: Two times a day (BID) | ORAL | Status: DC

## 2013-08-31 MED ORDER — METOPROLOL TARTRATE 50 MG PO TABS: ORAL_TABLET | ORAL | Status: DC

## 2013-08-31 NOTE — Progress Notes (Signed)
08/31/2013 [name]   08-20-1960  [id number]  Primary Physicia No PCP Per Patient Primary Cardiologist: Dr. Gwenlyn Found  HPI:  The patient is a 53 year old female, followed by Dr. Gwenlyn Found, who presents to clinic today with complaints of left-sided lower lip/chin numbness. Her cardiac history is significant for CAD. In 2013 she underwent a left heart catheterization which demonstrated 60% LAD stenosis, 50% mid RCA stenosis, 70% distal RCA stenosis with normal LV function. She underwent repeat catheterization again in 2014 which revealed unchanged anatomy from her prior catheterization. LV function remained normal. Medical therapy was recommended. Her other medical problems include hypertension, hyperlipidemia, obesity and tobacco use.  Today in clinic, she stated that she developed left sided lip numbness 3 days ago. It has been constant. Her numbness is localized to one area and she denies any numbness or tingling through the rest of her face or extremities. She has not had any facial droop, slurred speech, confusion, weakness or visual changes. Her sensation of numbness is worse and when she palpates her lip. She denies any recent trauma to her mouth. No recent exposure to any hot or extremly cold liquids/foods. No prolonged sun exposure. Denies any new cosmetic products. No rashes. No fevers or chills. No lip swelling/angioedema.  No recent new medications. She also denies chest pain, dyspnea, dizziness, lightheadedness, syncope/near-syncope.  She also has an additional complaint of persistently elevated blood pressures. She has been keeping regular track of her blood pressures at home and reports to clinic today with a log of her readings. Her pressures have been presistently elevated in the [id number] to [id number] sytolic. As outlined above, she has been fairly asymptomatic, denying chest pain, dyspnea, headache and visual changes. Her blood pressure today in clinic is elevated at 160/76. She continues to  smoke, but states she has cut down significantly. She smokes less than a pack per week. She also admits to some dietary indiscretion stating that she frequently consumes foods high in sodium content.  She also drinks on average 2 cups of coffee per day. Denies excessive alcohol use. She does not exercise much. She reports daily medication compliance with her antihypertensives.   Current Outpatient Prescriptions  Medication Sig Dispense Refill  . amLODipine (NORVASC) 10 MG tablet TAKE ONE TABLET BY MOUTH ONCE DAILY  30 tablet  10  . aspirin 81 MG tablet Take 81 mg by mouth at bedtime.      . cyclobenzaprine (FLEXERIL) 5 MG tablet Take 1 tablet (5 mg total) by mouth at bedtime.  30 tablet  1  . Inositol Niacinate (NIACIN FLUSH FREE PO) Take by mouth.      . isosorbide mononitrate (IMDUR) 30 MG 24 hr tablet TAKE ONE TABLET BY MOUTH ONCE DAILY  30 tablet  6  . lisinopril (PRINIVIL,ZESTRIL) 20 MG tablet TAKE ONE TABLET BY MOUTH TWICE DAILY  60 tablet  6  . metoprolol (LOPRESSOR) 50 MG tablet Take 1 1/2 tablets twice a day  90 tablet  6  . niacin (SLO-NIACIN) 500 MG tablet Take 1 tablet (500 mg total) by mouth at bedtime.      . nitroGLYCERIN (NITROSTAT) 0.4 MG SL tablet Place 1 tablet (0.4 mg total) under the tongue every 5 (five) minutes as needed for chest pain.  25 tablet  2  . rosuvastatin (CRESTOR) 5 MG tablet Take 5 mg by mouth 3 (three) times a week.      . traMADol (ULTRAM) 50 MG tablet TAKE ONE TO TWO TABLETS BY MOUTH  EVERY 6 HOURS AS NEEDED  40 tablet  0  . triamterene-hydrochlorothiazide (MAXZIDE-25) 37.5-25 MG per tablet TAKE ONE TABLET BY MOUTH EVERY DAY  30 tablet  10   No current facility-administered medications for this visit.    Allergies  Allergen Reactions  . Bupropion     Other reaction(s): Other Suicidal thoughts and increased depression  . Simvastatin     myalgias  . Wellbutrin [Bupropion Hcl]     unknown  . Zetia [Ezetimibe]     myalgias    History   Social  History  . Marital Status: Divorced    Spouse Name: N/A    Number of Children: N/A  . Years of Education: N/A   Occupational History  . Not on file.   Social History Main Topics  . Smoking status: Current Every Day Smoker -- 1.00 packs/day for 30 years    Types: Cigarettes  . Smokeless tobacco: Not on file  . Alcohol Use: No  . Drug Use: No  . Sexual Activity: No   Other Topics Concern  . Not on file   Social History Narrative  . No narrative on file     Review of Systems: General: negative for chills, fever, night sweats or weight changes.  Cardiovascular: negative for chest pain, dyspnea on exertion, edema, orthopnea, palpitations, paroxysmal nocturnal dyspnea or shortness of breath Dermatological: negative for rash Respiratory: negative for cough or wheezing Urologic: negative for hematuria Abdominal: negative for nausea, vomiting, diarrhea, bright red blood per rectum, melena, or hematemesis Neurologic: negative for visual changes, syncope, or dizziness All other systems reviewed and are otherwise negative except as noted above.    Blood pressure 168/76, pulse 67, height 5\' 4"  (1.626 m), weight 226 lb (102.513 kg).  General appearance: alert, cooperative and no distress Neck: no carotid bruit and no JVD Lungs: clear to auscultation bilaterally Heart: regular rate and rhythm, S1, S2 normal, no murmur, click, rub or gallop Extremities: no LEE Pulses: 2+ and symmetric Skin: warm and dry Neurologic: Grossly normal  EKG NSR with nonspecific ST abnormality (unchanged compared to prior).  ASSESSMENT AND PLAN:   1. Lip numbness: localized area on the left anterior aspect of her lower lip/ chin.  Sensation worsened with light palpation. I feel that symptoms are benign and not associated with any other neurological deficits. No symptoms concerning for angina.    2. HTN: on HCTZ, amlodipine, metoprolol and lisinopriol. HR limits further titration of BB. Will add low dose  hydalazine BID. F/u in 4-6 weeks with pharmacist for blood pressure check. Also encourage daily exercise to help lower blood pressure as well as complete smoking cessation, avoidance of high sodium foods as well as decreasing caffeine consumption.  3. CAD: denies chest pain. Continue ASA, statin, BB and ACE  4. Dyslipidemia: On statin therapy. Last assessment was over 1 year ago Will repeat lipid panel and hepatic panel.    PLAN  F/u in 4 weeks for repeat BP check. F/u in 6 months with Dr. Gwenlyn Found.   Carmelite Violet, [place] 08/31/2013 5:16 PM

## 2013-08-31 NOTE — Patient Instructions (Signed)
START HYDRALAZINE 25 MG 1 TABLET TWICE A DAY  CONTINUE WITH METOPROLOL 50 MG-- 1 AND 1/2 TABLETS TWICE A DAY  Your physician recommends  lab work  Cmp,lipid  Your physician recommends that you schedule a follow-up appointment in 4 weeks blood pressure check with [facility] physician wants you to follow-up in 6 month Dr Gwenlyn Found.  You will receive a reminder letter in the mail two months in advance. If you don't receive a letter, please call our office to schedule the follow-up appointment.

## 2013-09-05 ENCOUNTER — Encounter: Payer: Self-pay | Admitting: Cardiology

## 2013-09-07 LAB — COMPREHENSIVE METABOLIC PANEL
ALBUMIN: 3.5 g/dL (ref 3.5–5.2)
ALK PHOS: 37 U/L — AB (ref 39–117)
ALT: 51 U/L — ABNORMAL HIGH (ref 0–35)
AST: 37 U/L (ref 0–37)
BUN: 17 mg/dL (ref 6–23)
CALCIUM: 8.3 mg/dL — AB (ref 8.4–10.5)
CHLORIDE: 103 meq/L (ref 96–112)
CO2: 25 mEq/L (ref 19–32)
Creat: 1.07 mg/dL (ref 0.50–1.10)
Glucose, Bld: 109 mg/dL — ABNORMAL HIGH (ref 70–99)
POTASSIUM: 4.1 meq/L (ref 3.5–5.3)
SODIUM: 137 meq/L (ref 135–145)
TOTAL PROTEIN: 6.5 g/dL (ref 6.0–8.3)
Total Bilirubin: 0.3 mg/dL (ref 0.2–1.2)

## 2013-09-07 LAB — LIPID PANEL
CHOLESTEROL: 134 mg/dL (ref 0–200)
HDL: 25 mg/dL — ABNORMAL LOW (ref >39)
LDL Cholesterol: 64 mg/dL (ref 0–99)
Total CHOL/HDL Ratio: 5.4 Ratio
Triglycerides: 224 mg/dL — ABNORMAL HIGH (ref <150)
VLDL: 45 mg/dL — ABNORMAL HIGH (ref 0–40)

## 2013-09-11 ENCOUNTER — Ambulatory Visit: Payer: Medicaid Other | Admitting: Cardiology

## 2013-10-03 ENCOUNTER — Ambulatory Visit (INDEPENDENT_AMBULATORY_CARE_PROVIDER_SITE_OTHER): Payer: Medicaid Other | Admitting: Pharmacist Clinician (PhC)/ Clinical Pharmacy Specialist

## 2013-10-03 VITALS — BP 140/76 | Ht 64.0 in | Wt 227.2 lb

## 2013-10-03 DIAGNOSIS — I10 Essential (primary) hypertension: Secondary | ICD-10-CM

## 2013-10-03 NOTE — Patient Instructions (Signed)
Return for a a follow up appointment in 6 wks  Your blood pressure today is 140/76  Check your blood pressure at home daily (if able) and keep record of the readings.  Take your BP meds as follows - continue with all current medications  Bring all of your meds, your BP cuff and your record of home blood pressures to your next appointment.  Exercise as you're able, try to walk approximately 30 minutes per day.  Keep salt intake to a minimum, especially watch canned and prepared boxed foods.  Eat more fresh fruits and vegetables and fewer canned items.  Avoid eating in fast food restaurants.    HOW TO TAKE YOUR BLOOD PRESSURE:   Rest 5 minutes before taking your blood pressure.    Don't smoke or drink caffeinated beverages for at least 30 minutes before.   Take your blood pressure before (not after) you eat.   Sit comfortably with your back supported and both feet on the floor (don't cross your legs).   Elevate your arm to heart level on a table or a desk.   Use the proper sized cuff. It should fit smoothly and snugly around your bare upper arm. There should be enough room to slip a fingertip under the cuff. The bottom edge of the cuff should be 1 inch above the crease of the elbow.   Ideally, take 3 measurements at one sitting and record the average.

## 2013-10-06 ENCOUNTER — Encounter: Payer: Self-pay | Admitting: Pharmacist Clinician (PhC)/ Clinical Pharmacy Specialist

## 2013-10-06 NOTE — Progress Notes (Signed)
10/06/2013 [name] Jul 07, 1960 [id number]   HPI:  [name] is a 53 y.o. female patient of Dr Gwenlyn Found, with a PMH below who presents today for a blood pressure check.  Her cardiac history is significant in that her first heart cath was in 2013 (at age 55), showing 70% distal RCA stenosis, 50% mid RCA stenosis and 60% LAD stenosis.  A repeat cath the following year showed no significant changes.  Her maternal grandfather suffered an MI, as did a maternal aunt (at the age of 55).  Her father also had significant coronary disease and hypertension, he died during surgery for CABG  X 4.  She has one brother who had stent placement approximately 10 years ago.  She has had problems with hypertension for the last 15 or so years, starting in the late 1990s.    She rarely drinks alcohol, and is down to only social smoking, stating the last pack has lasted her 8 months.  She does little in the way of exercise.  Most of her meals are home cooked, and although she doesn't add salt while cooking, she still adds to her food at the table.  She does drink both coffee and caffeinated sodas on a regular basis.   She has a home BP cuff, showing most of the readings still in the 150-170s/90-104.  The cuff is 75-80 years old.  She admits to snoring, stating her son can hear her from his bedroom.    Current BP meds include amlodipine 10mg  each evening and maxzide 25 each morning, as well as twice daily metoprolol 50mg , lisinopril 20mg  and hydralazine 25mg .   She states compliance with medications.  She recently quit taking her Crestor, due to both muscle pains and the high cost.      Current Outpatient Prescriptions  Medication Sig Dispense Refill  . amLODipine (NORVASC) 10 MG tablet TAKE ONE TABLET BY MOUTH ONCE DAILY  30 tablet  10  . aspirin 81 MG tablet Take 81 mg by mouth at bedtime.      . hydrALAZINE (APRESOLINE) 25 MG tablet Take 1 tablet (25 mg total) by mouth 2 (two) times daily.  180 tablet  3   . Inositol Niacinate (NIACIN FLUSH FREE PO) Take by mouth.      . isosorbide mononitrate (IMDUR) 30 MG 24 hr tablet TAKE ONE TABLET BY MOUTH ONCE DAILY  30 tablet  6  . lisinopril (PRINIVIL,ZESTRIL) 20 MG tablet TAKE ONE TABLET BY MOUTH TWICE DAILY  60 tablet  6  . metoprolol (LOPRESSOR) 50 MG tablet Take 1 1/2 tablets twice a day  270 tablet  3  . nitroGLYCERIN (NITROSTAT) 0.4 MG SL tablet Place 1 tablet (0.4 mg total) under the tongue every 5 (five) minutes as needed for chest pain.  25 tablet  2  . triamterene-hydrochlorothiazide (MAXZIDE-25) 37.5-25 MG per tablet TAKE ONE TABLET BY MOUTH EVERY DAY  30 tablet  10   No current facility-administered medications for this visit.    Allergies  Allergen Reactions  . Bupropion     Other reaction(s): Other Suicidal thoughts and increased depression  . Rosuvastatin Other (See Comments)    myalgia  . Simvastatin     myalgias  . Wellbutrin [Bupropion Hcl]     unknown  . Zetia [Ezetimibe]     myalgias    Past Medical History  Diagnosis Date  . Angina   . Hypertension   . Unstable angina 04/15/2011  . CAD (coronary  artery disease), non obstructive CAD in 2010 04/15/2011  . Abnormal stress test, Lexiscan myoview 03/25/2011-sm. anterior ischemia 04/15/2011  . HTN (hypertension) 04/15/2011  . Dyslipidemia 04/15/2011  . Metabolic syndrome 99991111  . Tobacco abuse 04/15/2011  . Family history of premature CAD 04/15/2011  . Reflux   . Palpitations     Blood pressure 140/76, height 5\' 4"  (1.626 m), weight 227 lb 3.2 oz (103.057 kg).   ASSESSMENT AND PLAN:  Today her BP is at goal, but with her extensive cardiac history and young age, I would actually prefer to follow JNC 7 guidelines.  She has significant risk factors for CVD including obesity, dyslipidemia and early onset CVD in family.   I would like to see her BP ideally closer to [id number] systolic.  We talked extensively about non-pharmacologic issues that affect blood pressure.  She is concerned  about possible sleep apnea and would be interested in having a sleep study done.  She does admit to snoring loud enough for her son to hear in another room.  We talked about dietary concerns and the need to lose weight.  Her last fasting glucose was 109.  I encouraged eating more vegetables and healthier proteins, as well as finding some kind of exercise to do on a regular basis.  I will talk to Dr. Gwenlyn Found about setting up a sleep apnea study.  I will see her back in 6 weeks for follow up.  Tommy Medal PharmD CPP [facility]

## 2013-10-08 ENCOUNTER — Telehealth: Payer: Self-pay | Admitting: *Deleted

## 2013-10-08 DIAGNOSIS — R0683 Snoring: Secondary | ICD-10-CM

## 2013-10-08 DIAGNOSIS — I10 Essential (primary) hypertension: Secondary | ICD-10-CM

## 2013-10-08 NOTE — Telephone Encounter (Signed)
Order placed and patient notified 

## 2013-10-08 NOTE — Telephone Encounter (Signed)
Message copied by Chauncy Lean on Mon Oct 08, 2013 10:44 AM ------      Message from: Lorretta Harp      Created: Sun Oct 07, 2013  4:30 PM      Regarding: RE: sleep study       Sure, have Curt Bears order            JJB      ----- Message -----         From: Tommy Medal, RPH-CPP         Sent: 10/06/2013  10:29 AM           To: Chauncy Lean, RN, Lorretta Harp, MD      Subject: sleep study                                              Ms Porges has HTN and multiple CVD risk factors.  She is a long-time snorer and is interested in having a sleep study done to see if sleep apnea is a cause of her HTN.  Can we get that set up for her or do you need other diganoses?            Thanks,       Erasmo Downer       ------

## 2013-10-26 ENCOUNTER — Other Ambulatory Visit: Payer: Self-pay

## 2013-11-08 ENCOUNTER — Ambulatory Visit: Payer: Medicaid Other | Admitting: Cardiovascular Disease

## 2013-11-14 ENCOUNTER — Ambulatory Visit: Payer: Medicaid Other | Admitting: Pharmacist Clinician (PhC)/ Clinical Pharmacy Specialist

## 2013-11-19 ENCOUNTER — Encounter: Payer: Self-pay | Admitting: Pharmacist Clinician (PhC)/ Clinical Pharmacy Specialist

## 2013-11-19 ENCOUNTER — Ambulatory Visit (INDEPENDENT_AMBULATORY_CARE_PROVIDER_SITE_OTHER): Payer: Medicaid Other | Admitting: Pharmacist Clinician (PhC)/ Clinical Pharmacy Specialist

## 2013-11-19 VITALS — BP 142/82 | HR 72 | Ht 64.0 in | Wt 228.2 lb

## 2013-11-19 DIAGNOSIS — I10 Essential (primary) hypertension: Secondary | ICD-10-CM

## 2013-11-19 MED ORDER — HYDRALAZINE HCL 50 MG PO TABS: 50.0000 mg | ORAL_TABLET | Freq: Two times a day (BID) | ORAL | Status: DC

## 2013-11-19 NOTE — Progress Notes (Signed)
11/19/2013 [name] [birth year] [id number]   HPI:  [name] is a 53 y.o. female patient of Dr Gwenlyn Found, with a PMH below who presents today for a blood pressure check.  Her cardiac history is significant in that her first heart cath was in 2013 (at age 12), showing 70% distal RCA stenosis, 50% mid RCA stenosis and 60% LAD stenosis.  A repeat cath the following year showed no significant changes.  Her maternal grandfather suffered an MI, as did a maternal aunt (at the age of 57).  Her father also had significant coronary disease and hypertension, he died during surgery for CABG  X 4.  She has one brother who had stent placement approximately 10 years ago.  She has had problems with hypertension for the last 15 or so years, starting in the late 1990s.    She rarely drinks alcohol, and is down to only social smoking, stating the last pack has lasted her 8 months.  She does little in the way of exercise.  Most of her meals are home cooked, and although she doesn't add salt while cooking, she still adds to her food at the table.  She does drink both coffee and caffeinated sodas on a regular basis.   She gets little to no exercise.  She admits to snoring, stating her son can hear her from his bedroom.    Current BP meds include amlodipine 10mg  each evening and maxzide 25 each morning, as well as twice daily metoprolol 50mg , lisinopril 20mg  and hydralazine 25mg .   She states compliance with medications.  She recently quit taking her Crestor, due to both muscle pains and the high cost.  She has a home BP cuff, a [employer] about 9-49 years old that she brings into the office today.  Most of her home readings have been averaging 150-160s/80, with the highest being 178/87.  She brings the cuff in today for verification.    At her last visit we recommended a sleep study.  She has not had this done yet, as she is uninsured.  She will have medical benefits as of December 1, and she plans to schedule it  for sometime after that.      Current Outpatient Prescriptions  Medication Sig Dispense Refill  . amLODipine (NORVASC) 10 MG tablet TAKE ONE TABLET BY MOUTH ONCE DAILY 30 tablet 10  . aspirin 81 MG tablet Take 81 mg by mouth at bedtime.    . hydrALAZINE (APRESOLINE) 25 MG tablet Take 1 tablet (25 mg total) by mouth 2 (two) times daily. 180 tablet 3  . Inositol Niacinate (NIACIN FLUSH FREE PO) Take by mouth.    . isosorbide mononitrate (IMDUR) 30 MG 24 hr tablet TAKE ONE TABLET BY MOUTH ONCE DAILY 30 tablet 6  . lisinopril (PRINIVIL,ZESTRIL) 20 MG tablet TAKE ONE TABLET BY MOUTH TWICE DAILY 60 tablet 6  . metoprolol (LOPRESSOR) 50 MG tablet Take 1 1/2 tablets twice a day 270 tablet 3  . nitroGLYCERIN (NITROSTAT) 0.4 MG SL tablet Place 1 tablet (0.4 mg total) under the tongue every 5 (five) minutes as needed for chest pain. 25 tablet 2  . triamterene-hydrochlorothiazide (MAXZIDE-25) 37.5-25 MG per tablet TAKE ONE TABLET BY MOUTH EVERY DAY 30 tablet 10   No current facility-administered medications for this visit.    Allergies  Allergen Reactions  . Bupropion     Other reaction(s): Other Suicidal thoughts and increased depression  . Rosuvastatin Other (See Comments)  myalgia  . Simvastatin     myalgias  . Wellbutrin [Bupropion Hcl]     unknown  . Zetia [Ezetimibe]     myalgias    Past Medical History  Diagnosis Date  . Angina   . Hypertension   . Unstable angina 04/15/2011  . CAD (coronary artery disease), non obstructive CAD in 2010 04/15/2011  . Abnormal stress test, Lexiscan myoview 03/25/2011-sm. anterior ischemia 04/15/2011  . HTN (hypertension) 04/15/2011  . Dyslipidemia 04/15/2011  . Metabolic syndrome 99991111  . Tobacco abuse 04/15/2011  . Family history of premature CAD 04/15/2011  . Reflux   . Palpitations     Blood pressure 142/82, pulse 72, height 5\' 4"  (1.626 m), weight 228 lb 3.2 oz (103.511 kg).   ASSESSMENT AND PLAN:  Today her BP is at goal, but with her  extensive cardiac history and young age, I would actually prefer to follow JNC 7 guidelines.  She has significant risk factors for CVD including obesity, dyslipidemia and early onset CVD in family.   I would like to see her BP ideally closer to [id number] systolic.  She plans to get the sleep study done once her insurance benefits kick in (she is now working for [employer] in [employer].  She has tried giving up fast food, not having had any in the past 4-5 weeks.  I praised this and encouraged her to continue taking small steps in the right direction in regards to her diet.  Her weight today is unchanged.   Her last fasting glucose was 109.  I encouraged eating more vegetables and healthier proteins, as well as finding some kind of exercise to do on a regular basis.  Her home BP cuff read within 5 points of the office cuff.  Of note, I took her blood pressure several times, the first being 160/82, but after 5 minutes of being seated, it dropped to 142/82, repeated x 2.   For now I am going to increase her hydralazine to 50mg  twice daily and have asked her to come back in 2 months for a follow up check.    Tommy Medal PharmD CPP [facility]

## 2013-11-19 NOTE — Patient Instructions (Signed)
Return for a a follow up appointment in 2 months  Your blood pressure today is 142/82  Check your blood pressure at home daily  and keep record of the readings.  Take your BP meds as follows - increase hydralazine to 50mg  twice daily, keep all other meds the same  Bring all of your meds, your BP cuff and your record of home blood pressures to your next appointment.  Exercise as you're able, try to walk approximately 30 minutes per day.  Keep salt intake to a minimum, especially watch canned and prepared boxed foods.  Eat more fresh fruits and vegetables and fewer canned items.  Avoid eating in fast food restaurants.    HOW TO TAKE YOUR BLOOD PRESSURE: . Rest 5 minutes before taking your blood pressure. .  Don't smoke or drink caffeinated beverages for at least 30 minutes before. . Take your blood pressure before (not after) you eat. . Sit comfortably with your back supported and both feet on the floor (don't cross your legs). . Elevate your arm to heart level on a table or a desk. . Use the proper sized cuff. It should fit smoothly and snugly around your bare upper arm. There should be enough room to slip a fingertip under the cuff. The bottom edge of the cuff should be 1 inch above the crease of the elbow. . Ideally, take 3 measurements at one sitting and record the average.

## 2013-12-20 ENCOUNTER — Encounter (HOSPITAL_COMMUNITY): Payer: Self-pay | Admitting: Cardiovascular Disease

## 2013-12-28 ENCOUNTER — Emergency Department (HOSPITAL_COMMUNITY): Payer: Self-pay

## 2013-12-28 ENCOUNTER — Emergency Department (HOSPITAL_COMMUNITY)
Admission: EM | Admit: 2013-12-28 | Discharge: 2013-12-28 | Disposition: A | Discharge status: Home / Self Care | Payer: Self-pay | Attending: Emergency Medicine | Admitting: Emergency Medicine

## 2013-12-28 DIAGNOSIS — I2510 Atherosclerotic heart disease of native coronary artery without angina pectoris: Secondary | ICD-10-CM | POA: Insufficient documentation

## 2013-12-28 DIAGNOSIS — Z8639 Personal history of other endocrine, nutritional and metabolic disease: Secondary | ICD-10-CM | POA: Insufficient documentation

## 2013-12-28 DIAGNOSIS — Z720 Tobacco use: Secondary | ICD-10-CM | POA: Insufficient documentation

## 2013-12-28 DIAGNOSIS — M7052 Other bursitis of knee, left knee: Secondary | ICD-10-CM

## 2013-12-28 DIAGNOSIS — Z9889 Other specified postprocedural states: Secondary | ICD-10-CM | POA: Insufficient documentation

## 2013-12-28 DIAGNOSIS — Z79899 Other long term (current) drug therapy: Secondary | ICD-10-CM | POA: Insufficient documentation

## 2013-12-28 DIAGNOSIS — S8392XA Sprain of unspecified site of left knee, initial encounter: Secondary | ICD-10-CM | POA: Insufficient documentation

## 2013-12-28 DIAGNOSIS — Z7982 Long term (current) use of aspirin: Secondary | ICD-10-CM | POA: Insufficient documentation

## 2013-12-28 DIAGNOSIS — X58XXXA Exposure to other specified factors, initial encounter: Secondary | ICD-10-CM | POA: Insufficient documentation

## 2013-12-28 DIAGNOSIS — Z8781 Personal history of (healed) traumatic fracture: Secondary | ICD-10-CM | POA: Insufficient documentation

## 2013-12-28 DIAGNOSIS — Y9323 Activity, snow (alpine) (downhill) skiing, snow boarding, sledding, tobogganing and snow tubing: Secondary | ICD-10-CM | POA: Insufficient documentation

## 2013-12-28 DIAGNOSIS — Y9289 Other specified places as the place of occurrence of the external cause: Secondary | ICD-10-CM | POA: Insufficient documentation

## 2013-12-28 DIAGNOSIS — Z8673 Personal history of transient ischemic attack (TIA), and cerebral infarction without residual deficits: Secondary | ICD-10-CM | POA: Insufficient documentation

## 2013-12-28 DIAGNOSIS — Y998 Other external cause status: Secondary | ICD-10-CM | POA: Insufficient documentation

## 2013-12-28 DIAGNOSIS — Z8719 Personal history of other diseases of the digestive system: Secondary | ICD-10-CM | POA: Insufficient documentation

## 2013-12-28 DIAGNOSIS — R52 Pain, unspecified: Secondary | ICD-10-CM

## 2013-12-28 DIAGNOSIS — I10 Essential (primary) hypertension: Secondary | ICD-10-CM | POA: Insufficient documentation

## 2013-12-28 MED ORDER — HYDROCODONE-ACETAMINOPHEN 5-325 MG PO TABS: 1.0000 | ORAL_TABLET | Freq: Four times a day (QID) | ORAL | Status: DC | PRN

## 2013-12-28 NOTE — ED Notes (Signed)
Patient states she hurt her left knee @ 7 days ago while sledding.  Patient presents with redness, swelling and bruising to left knee.

## 2013-12-28 NOTE — ED Provider Notes (Signed)
CSN: [id number]     Arrival date & time 12/28/13  1334 History  This chart was scribed for Domenic Moras, PA-C, working with Evelina Bucy, MD found by Starleen Arms, ED Scribe. This patient was seen in room WTR5/WTR5 and the patient's care was started at 2:24 PM.   Chief Complaint  Patient presents with  . Knee Pain    left   The history is provided by the patient. No language interpreter was used.   HPI Comments: [name] is a 53 y.o. female who presents to the Emergency Department complaining of a right knee injury onset 1 week ago while sledding.  She complains of left knee pain and swelling since this time.  She reports icing the area with some reduction in swelling.  Patient is able to ambulate with pain.    Past Medical History  Diagnosis Date  . Angina   . Hypertension   . Unstable angina 04/15/2011  . CAD (coronary artery disease), non obstructive CAD in 2010 04/15/2011  . Abnormal stress test, Lexiscan myoview 03/25/2011-sm. anterior ischemia 04/15/2011  . HTN (hypertension) 04/15/2011  . Dyslipidemia 04/15/2011  . Metabolic syndrome 99991111  . Tobacco abuse 04/15/2011  . Family history of premature CAD 04/15/2011  . Reflux   . Palpitations    Past Surgical History  Procedure Laterality Date  . Appendectomy    . Appendectomy    . Wrist fracture surgery      right  . Renal doppler  06/05/2009    No evidence of significant diameter reduction  . Cardiac catheterization  02/07/2008    Recommendation-F/U stress test  . Cardiac catheterization  04/16/2011    Recommendation-Increase medical therapy trial  . Cardiovascular stress test  03/25/2011    Suspect subtle anterior septal ischemia  . Transthoracic echocardiogram  01/25/2008    EF 60-65%, Moderate concentric LVH  . Left heart catheterization with coronary angiogram N/A 04/16/2011    Procedure: LEFT HEART CATHETERIZATION WITH CORONARY ANGIOGRAM;  Surgeon: Troy Sine, MD;  Location: [facility] CATH LAB;  Service: Cardiovascular;   Laterality: N/A;  . Left heart catheterization with coronary angiogram N/A 09/01/2012    Procedure: LEFT HEART CATHETERIZATION WITH CORONARY ANGIOGRAM;  Surgeon: Pixie Casino, MD;  Location: [facility] CATH LAB;  Service: Cardiovascular;  Laterality: N/A;   Family History  Problem Relation Age of Onset  . Stroke Father   . Hypertension Father   . Coronary artery disease Brother   . Heart attack Maternal Aunt 41  . Stroke Maternal Grandmother   . Cancer Maternal Grandmother     Breast cancer  . Hypertension Maternal Grandfather   . Heart attack Maternal Grandfather    History  Substance Use Topics  . Smoking status: Current Every Day Smoker -- 0.25 packs/day for 30 years    Types: Cigarettes  . Smokeless tobacco: Not on file  . Alcohol Use: No   OB History    No data available     Review of Systems  Musculoskeletal: Positive for joint swelling and arthralgias.      Allergies  Bupropion; Rosuvastatin; Simvastatin; Wellbutrin; and Zetia  Home Medications   Prior to Admission medications   Medication Sig Start Date End Date Taking? Authorizing Provider  amLODipine (NORVASC) 10 MG tablet TAKE ONE TABLET BY MOUTH ONCE DAILY 12/16/12   Lorretta Harp, MD  aspirin 81 MG tablet Take 81 mg by mouth at bedtime.    Historical Provider, MD  hydrALAZINE (APRESOLINE) 50 MG tablet Take  1 tablet (50 mg total) by mouth 2 (two) times daily. 11/19/13   Lorretta Harp, MD  Inositol Niacinate (NIACIN FLUSH FREE PO) Take by mouth.    Historical Provider, MD  isosorbide mononitrate (IMDUR) 30 MG 24 hr tablet TAKE ONE TABLET BY MOUTH ONCE DAILY    Leonie Man, MD  lisinopril (PRINIVIL,ZESTRIL) 20 MG tablet TAKE ONE TABLET BY MOUTH TWICE DAILY    Leonie Man, MD  metoprolol (LOPRESSOR) 50 MG tablet Take 1 1/2 tablets twice a day 08/31/13   Brittainy M Simmons, PA-C  nitroGLYCERIN (NITROSTAT) 0.4 MG SL tablet Place 1 tablet (0.4 mg total) under the tongue every 5 (five) minutes as needed for  chest pain. 09/02/12   Brittainy Erie Noe, PA-C  triamterene-hydrochlorothiazide (MAXZIDE-25) 37.5-25 MG per tablet TAKE ONE TABLET BY MOUTH EVERY DAY 12/16/12   Lorretta Harp, MD   BP 171/78 mmHg  Pulse 77  Temp(Src) 97.7 F (36.5 C) (Oral)  Resp 14  Ht 5\' 4"  (1.626 m)  Wt 220 lb (99.791 kg)  BMI 37.74 kg/m2  SpO2 97%  LMP 12/14/2013 Physical Exam  Constitutional: She is oriented to person, place, and time. She appears well-developed and well-nourished. No distress.  HENT:  Head: Normocephalic and atraumatic.  Eyes: Conjunctivae and EOM are normal.  Neck: Neck supple. No tracheal deviation present.  Cardiovascular: Normal rate.   Pulmonary/Chest: Effort normal. No respiratory distress.  Musculoskeletal: Normal range of motion.  Left knee ecchymosis noted to the anterior lateral aspects of knee with TTP.  Effusion noted to the suprapatellar region with evidence of a bursiits .  Normal anterior and posterior drawer test.  NO pain with Varus and Valgus maneuver.  No pain at tibial tuberosity.    Neurological: She is alert and oriented to person, place, and time.  Skin: Skin is warm and dry.  Psychiatric: She has a normal mood and affect. Her behavior is normal.  Nursing note and vitals reviewed.   ED Course  Procedures (including critical care time)  DIAGNOSTIC STUDIES: Oxygen Saturation is 97% on RA, normal by my interpretation.    COORDINATION OF CARE:  2:55 PM Informed patient of suspicion of bursitis.  Will wrap knee.   Imaging showed no fx.  Advised patient to elevate, soak, and take ibuprofen as needed.  Patient acknowledges and agrees with plan.    Labs Review Labs Reviewed - No data to display  Imaging Review Dg Knee Complete 4 Views Left  12/28/2013   CLINICAL DATA:  Ice rink injury, hit left knee last Saturday, swelling, pain and bruising  EXAM: LEFT KNEE - COMPLETE 4+ VIEW  COMPARISON:  None.  FINDINGS: Five views of the left knee submitted. No displaced  fracture or subluxation. Multiple well corticated bony fragments are noted just anterior to proximal tibial tuberosity. This may be due to prior injury or calcific tendinopathy. Small avulsion fracture on or fragmented osteophyte cannot be excluded. Clinical correlation is necessary. Small joint effusion. Prepatellar soft tissue swelling. There is small calcification in medial collateral ligament adjacent to femoral condyle.  IMPRESSION: No displaced fracture or subluxation. Multiple well corticated bony fragments are noted just anterior to proximal tibial tuberosity. This may be due to prior injury or calcific tendinopathy. Small avulsion fracture on fragmented osteophyte cannot be excluded. Clinical correlation is necessary. Small joint effusion. Prepatellar soft tissue swelling.   Electronically Signed   By: Lahoma Crocker M.D.   On: 12/28/2013 14:40     EKG Interpretation None  MDM   Final diagnoses:  Left knee sprain, initial encounter  Suprapatellar bursitis of left knee    BP 171/78 mmHg  Pulse 77  Temp(Src) 97.7 F (36.5 C) (Oral)  Resp 14  Ht 5\' 4"  (1.626 m)  Wt 220 lb (99.791 kg)  BMI 37.74 kg/m2  SpO2 97%  LMP 12/14/2013   I personally performed the services described in this documentation, which was scribed in my presence. The recorded information has been reviewed and is accurate.     Domenic Moras, PA-C 12/29/13 [place], MD 12/29/13 [phone]

## 2013-12-28 NOTE — Discharge Instructions (Signed)
Take ibuprofen as needed for pain, but take vicodin if the pain is severe.  Follow instruction below for care.     Bursitis Bursitis is inflammation of a bursa. A bursa is a soft, fluid-filled sac. It cushions the soft tissue around a bone. Bursitis often occurs in the bursas near the shoulders, elbows, knees, pelvis, hips, heel, and Achilles tendon.  SYMPTOMS   Pain and tenderness in the affected area. Sometimes, pain radiates into surrounding areas. Specifically, pain with movement.  Limited range of motion of the affected joint.  Sometimes, painless swelling of the bursa.  Fever (when infected). CAUSES   Injury to a joint or bursa.  Overuse or strenuous exercise of a joint.  Gout (disease with inflamed joints).  Prolonged pressure on a joint containing bursas (resting on an elbow or kneeling).  Arthritis.  Acute or chronic infection.  Calcium deposits in shoulder tendons, with degeneration of the tendon. RISK INCREASES WITH:  Vigorous, repeated, or sudden increase in athletic training or activity level.  Failure to warm up properly.  Overstretching.  Improper exercise technique.  Playing sports on [employer]ro[place]f. PREVENTION  Avoid injuries or overuse of muscles.  Warm up and cool down properly. Do this before and after physical activity.  Maintain proper conditioning:  Joint flexibility.  Muscle strength and endurance.  Cardiovascular fitness.  Learn and use proper technique.  Wear protective equipment. PROGNOSIS  With proper treatment, symptoms often go away within 7 to 14 days.  RELATED COMPLICATIONS   Frequent recurrence of symptoms. This can result in a chronic, repetitive problem.  Joint stiffness.  Limited joint movement.  Infection of bursa.  Chronic inflammation or scarring of bursa. TREATMENT Treatment first involves protecting and resting the bursa and its joint. You may use ice or an elastic bandage to reduce inflammation.  Anti-inflammatory medicines may help resolve the swelling. If symptoms persist despite treatment, a caregiver may withdraw fluid from the bursa. They might also consider a corticosteroid injection. Sometimes, bursitis will persist in spite of nonsurgical treatment or will become infected. These cases may require removal (surgical excision) of the bursa.  MEDICATION   If pain medicine is needed, nonsteroidal anti-inflammatory medicines, such as aspirin and ibuprofen, or other minor pain relievers, such as acetaminophen, are often recommended.  Do not take pain medicine for 7 days before surgery.  Prescription pain relievers are usually only prescribed after surgery. Use only as directed and only as much as you need.  Ointments applied to the skin may be helpful.  Corticosteroid injections may be given. This is done to reduce inflammation in the bursa. HEAT AND COLD:  Cold treatment (icing) relieves pain and reduces inflammation. Cold treatment should be applied for 10 to 15 minutes every 2 to 3 hours for inflammation and pain, and immediately after any activity that aggravates your symptoms. Use ice packs or an ice massage.  Heat treatment may be used prior to performing the stretching and strengthening activities prescribed by your caregiver, physical therapist, or athletic trainer. Use a heat pack or a warm soak. SEEK MEDICAL CARE IF:   Symptoms get worse or do not improve in 2 weeks, despite treatment.  New, unexplained symptoms develop. (Drugs used in treatment may produce side effects.) Document Released: 12/28/2004 Document Revised: 05/14/2013 Document Reviewed: 04/11/2008 [facility] Patient Information 2015 [place], [place]. This information is not intended to replace advice given to you by your health care provider. Make sure you discuss any questions you have with your health care provider.

## 2014-01-14 ENCOUNTER — Other Ambulatory Visit: Payer: Self-pay | Admitting: Cardiovascular Disease

## 2014-03-02 ENCOUNTER — Other Ambulatory Visit: Payer: Self-pay | Admitting: Cardiology

## 2014-03-04 NOTE — Telephone Encounter (Signed)
Rx(s) sent to pharmacy electronically.  

## 2014-03-17 IMAGING — CR DG CHEST 2V
2 series · 2 of 2 positions shown · non-contrast
Comparison: 02/06/2008

CLINICAL DATA: Dizziness, chest pain.

CHEST - 2 VIEW

[w chest pa]
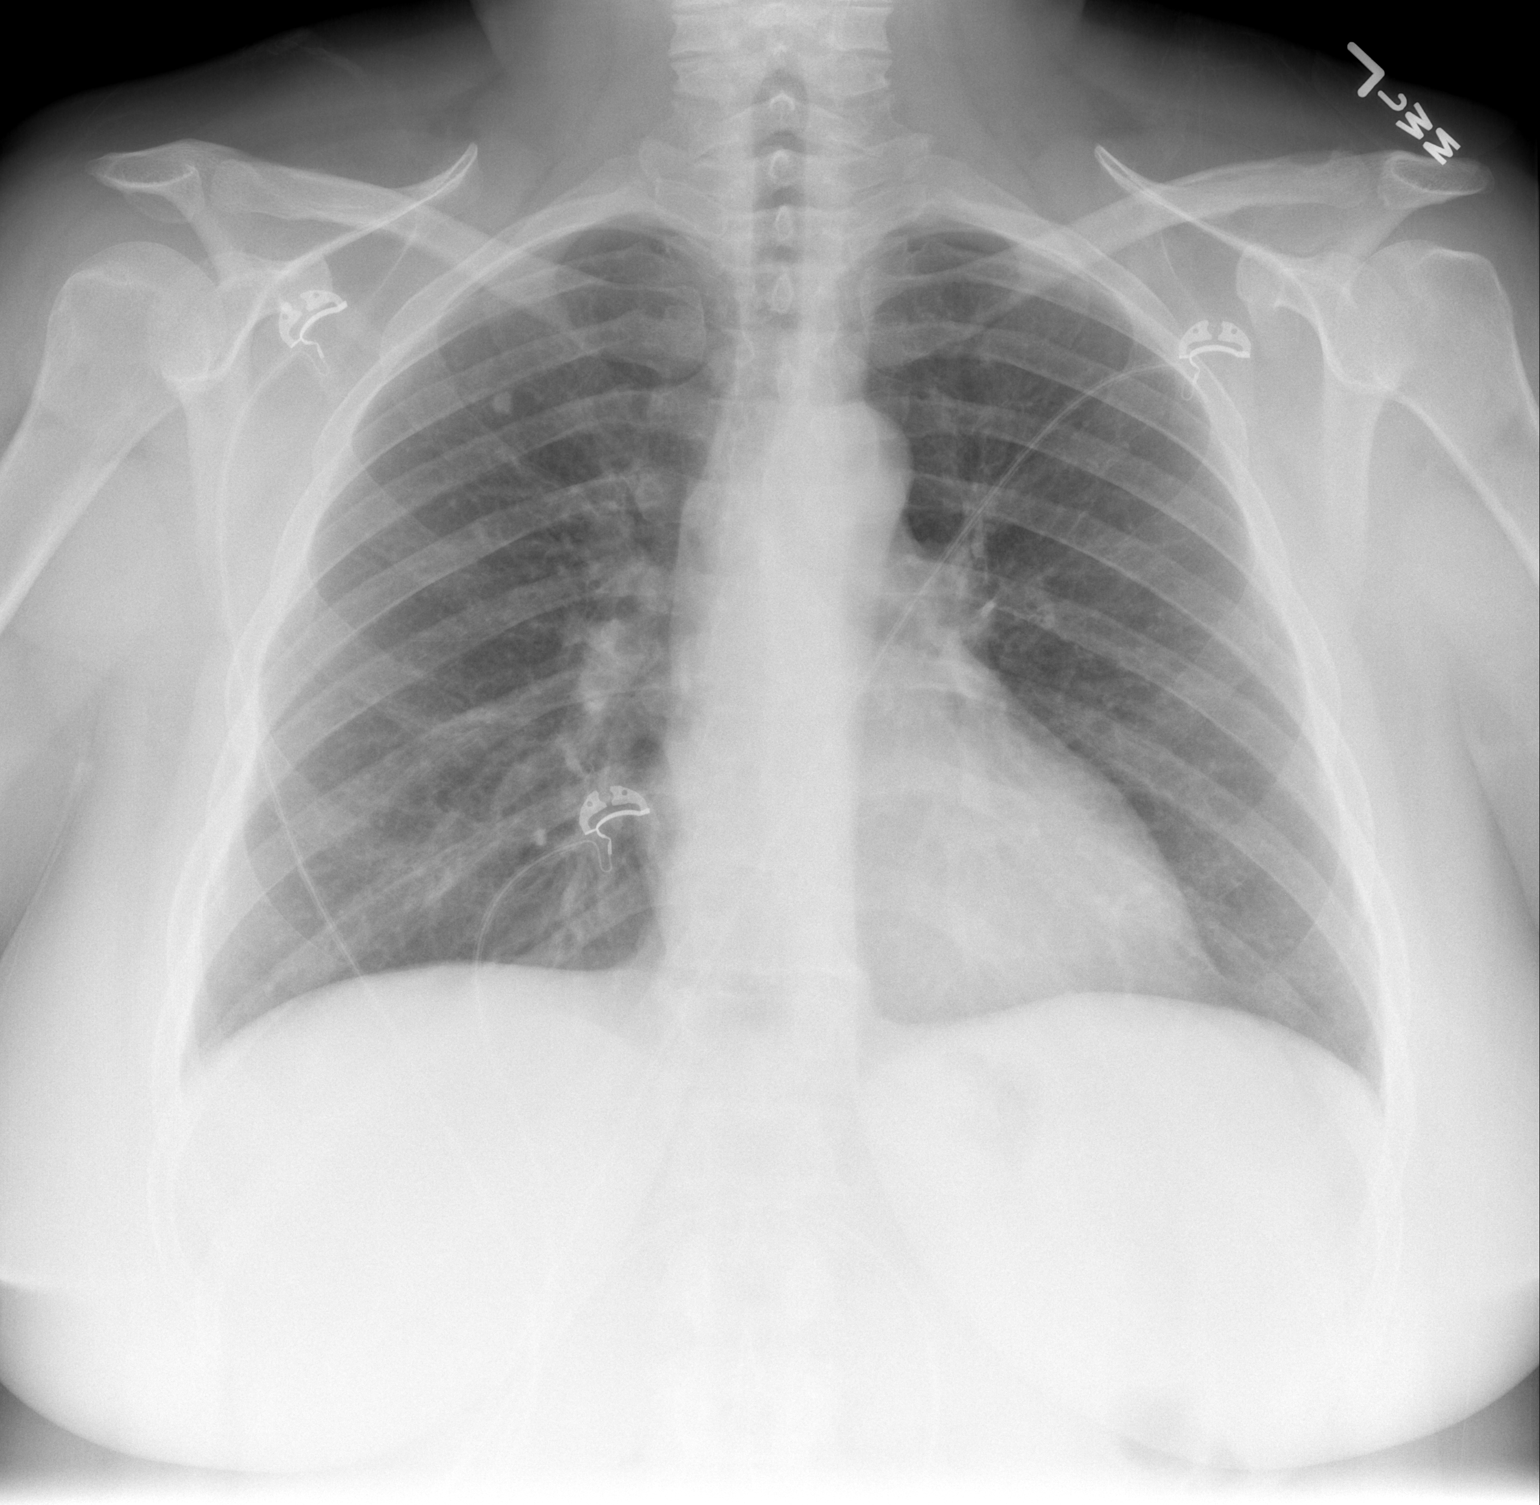

[w chest lat]
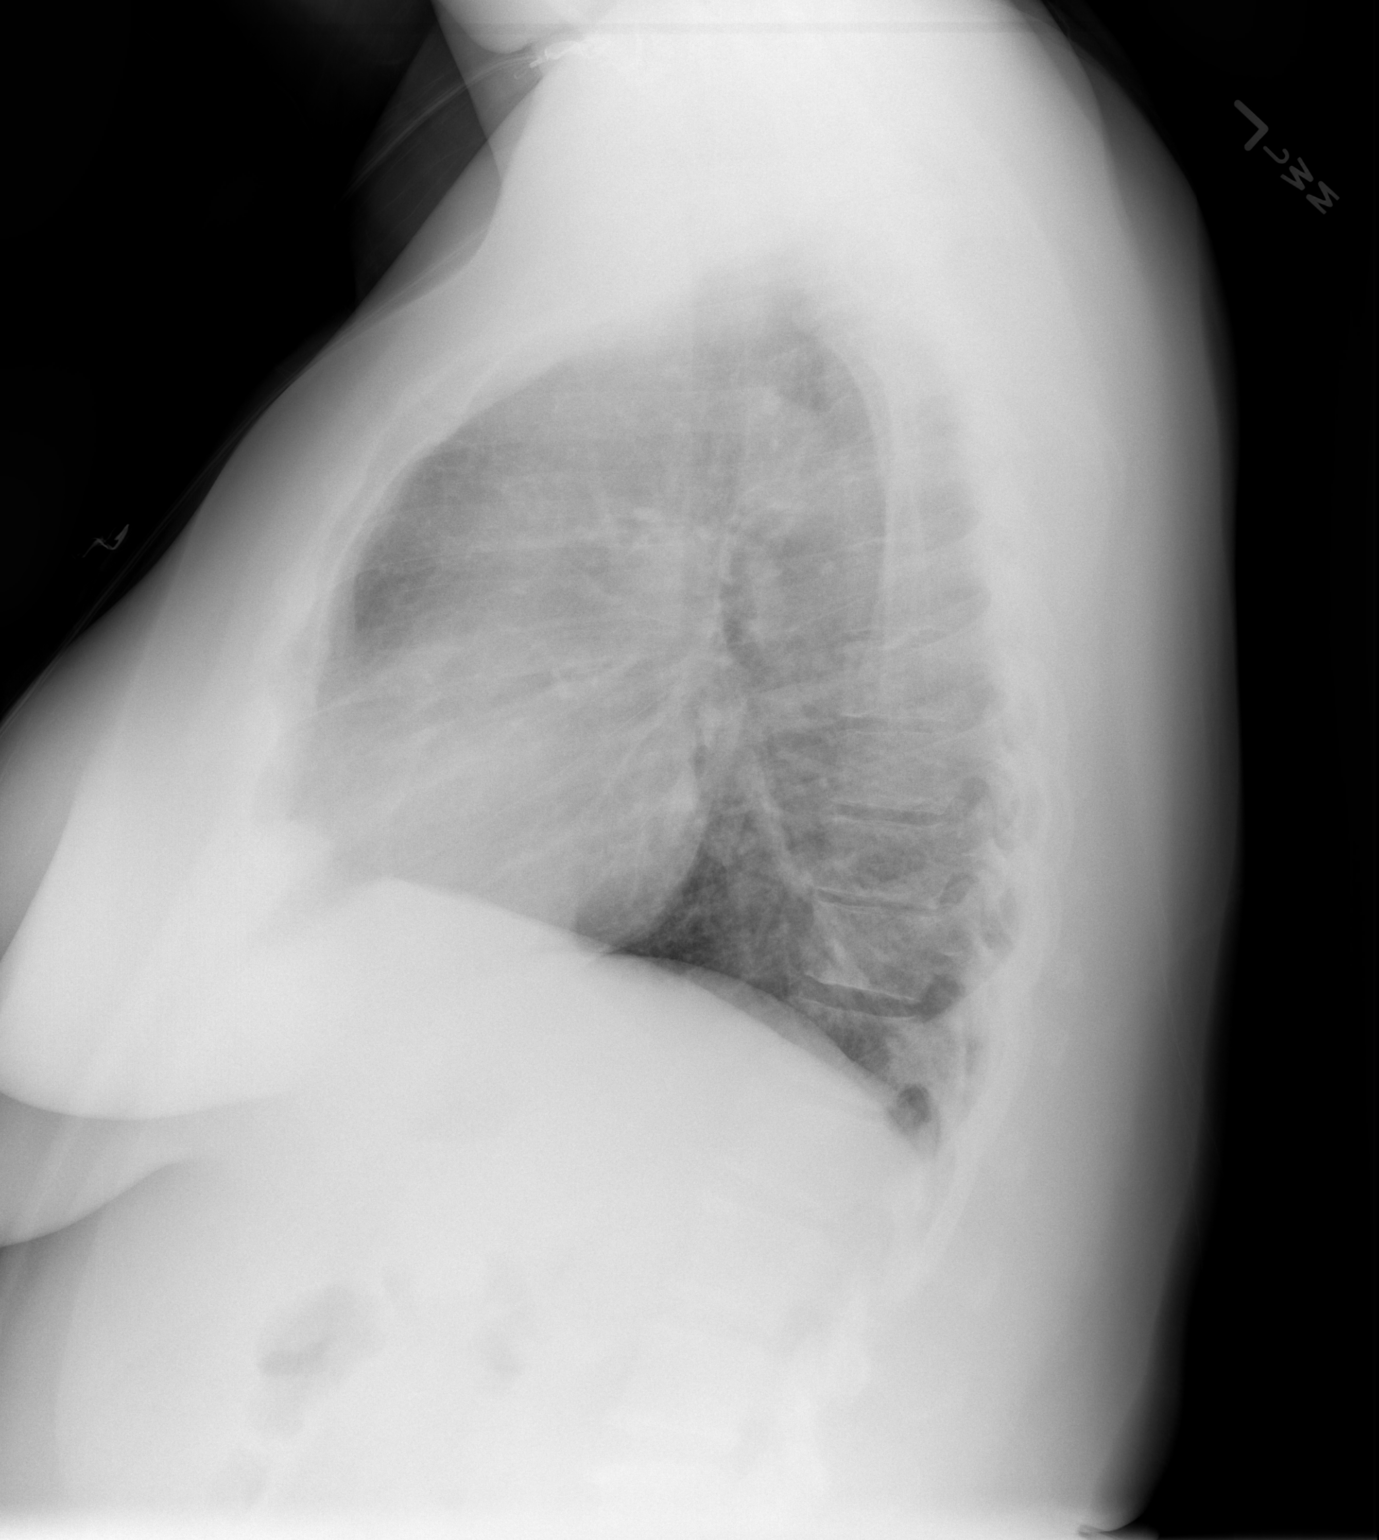

[2 of 2 positions shown; findings below may reference images not displayed]

FINDINGS: Calcified granuloma in the right upper lobe. Heart and
mediastinal contours are within normal limits.  No focal opacities
or effusions.  No acute bony abnormality.
IMPRESSION: No active cardiopulmonary disease.

## 2014-05-02 ENCOUNTER — Encounter: Payer: Self-pay | Admitting: Family Medicine

## 2014-05-02 ENCOUNTER — Telehealth: Payer: Self-pay | Admitting: Radiology

## 2014-05-02 ENCOUNTER — Ambulatory Visit (INDEPENDENT_AMBULATORY_CARE_PROVIDER_SITE_OTHER): Payer: Managed Care, Other (non HMO) | Admitting: Family Medicine

## 2014-05-02 VITALS — BP 163/89 | HR 71 | Temp 98.2°F | Resp 16 | Ht 64.0 in | Wt 233.0 lb

## 2014-05-02 DIAGNOSIS — I2510 Atherosclerotic heart disease of native coronary artery without angina pectoris: Secondary | ICD-10-CM | POA: Diagnosis not present

## 2014-05-02 DIAGNOSIS — Z789 Other specified health status: Secondary | ICD-10-CM

## 2014-05-02 DIAGNOSIS — E669 Obesity, unspecified: Secondary | ICD-10-CM | POA: Diagnosis not present

## 2014-05-02 DIAGNOSIS — R7989 Other specified abnormal findings of blood chemistry: Secondary | ICD-10-CM

## 2014-05-02 DIAGNOSIS — R791 Abnormal coagulation profile: Secondary | ICD-10-CM

## 2014-05-02 DIAGNOSIS — B078 Other viral warts: Secondary | ICD-10-CM | POA: Diagnosis not present

## 2014-05-02 DIAGNOSIS — R600 Localized edema: Secondary | ICD-10-CM

## 2014-05-02 DIAGNOSIS — R609 Edema, unspecified: Secondary | ICD-10-CM

## 2014-05-02 DIAGNOSIS — M79671 Pain in right foot: Secondary | ICD-10-CM

## 2014-05-02 LAB — D-DIMER, QUANTITATIVE: D-Dimer, Quant: 0.61 ug/mL-FEU — ABNORMAL HIGH (ref 0.00–0.48)

## 2014-05-02 NOTE — Telephone Encounter (Signed)
Right venous doppler scheduled for April 22 at 10:30 am. Pt notified and will go for study.

## 2014-05-02 NOTE — Patient Instructions (Signed)
See information about swelling below. Decrease sodium (fast food, frozen food often has higher quantities of this). Elevate legs when seated. Over time, weight loss can also lessen this.  You should receive a call or letter about your lab results within the next week to 10 days. If swelling not improving in next 10 days - return for recheck as we may need to check echo of your heart.  Follow up with your cardiologist as planned, and return to discuss warts and other areas of your skin further - with Chelle or myself.   Return to the clinic or go to the nearest emergency room if any of your symptoms worsen or new symptoms occur.  Peripheral Edema You have swelling in your legs (peripheral edema). This swelling is due to excess accumulation of salt and water in your body. Edema may be a sign of heart, kidney or liver disease, or a side effect of a medication. It may also be due to problems in the leg veins. Elevating your legs and using special support stockings may be very helpful, if the cause of the swelling is due to poor venous circulation. Avoid long periods of standing, whatever the cause. Treatment of edema depends on identifying the cause. Chips, pretzels, pickles and other salty foods should be avoided. Restricting salt in your diet is almost always needed. Water pills (diuretics) are often used to remove the excess salt and water from your body via urine. These medicines prevent the kidney from reabsorbing sodium. This increases urine flow. Diuretic treatment may also result in lowering of potassium levels in your body. Potassium supplements may be needed if you have to use diuretics daily. Daily weights can help you keep track of your progress in clearing your edema. You should call your caregiver for follow up care as recommended. SEEK IMMEDIATE MEDICAL CARE IF:   You have increased swelling, pain, redness, or heat in your legs.  You develop shortness of breath, especially when lying  down.  You develop chest or abdominal pain, weakness, or fainting.  You have a fever. Document Released: 02/05/2004 Document Revised: 03/22/2011 Document Reviewed: 01/15/2009 [facility] Patient Information 2015 [place], [place]. This information is not intended to replace advice given to you by your health care provider. Make sure you discuss any questions you have with your health care provider.

## 2014-05-02 NOTE — Progress Notes (Addendum)
Subjective:    Patient ID: [name], female    DOB: Nov 06, 1960, 54 y.o.   MRN: [id number]  This chart was scribed for Wendie Agreste, MD by Stephania Fragmin, ED Scribe. This patient was seen in room 22 and the patient's care was started at 1:28 PM.   HPI  Chief Complaint  Patient presents with  . right fooot swelling    3 weeks  . warts     HPI Comments: [name] is a 54 y.o. female.  Chart review: History of multiple medical problems as listed on her medical problems list, including CAD. Last cath in August 2014. Normal LV function. History of obesity.   She presents to the [facility] complaining of bilateral foot swelling., right greater than left, with onset 2-3 weeks ago, after patient had returned from a trip to [place], which involved a lot of walking. Patient also complains of associated numbness and tingling. The left foot improves at night, but the right foot has been constantly swollen. She does note some increased weight gain. She denies a history of DM. She denies any calf pain, SOB, or chest pain.  Patient also complains of what appears to be warts on her body, including on her right forearm and thumb.   She denies having a PCP, as she has not had insurance recently due to work-related reasons. She does have a cardiologist, Dr. Gwenlyn Found.     Patient Active Problem List   Diagnosis Date Noted  . Hematoma of groin - right, s/p cath; stable on discharge 09/02/2012  . Chest pain 08/31/2012  . Obesity (BMI 30-39.9) 08/31/2012  . Renal cyst- on CT in 2002 08/31/2012  . Unstable angina, most likely GI with stable CAD by cath 04/16/11 04/15/2011  . CAD (coronary artery disease), non obstructive CAD in 2010, no change with cath 09/01/12 04/15/2011  . Abnormal stress test, Lexiscan myoview 03/25/2011-sm. anterior ischemia 04/15/2011  . HTN (hypertension) 04/15/2011  . Dyslipidemia- low HDL 04/15/2011  . Metabolic syndrome [id number]  .  Tobacco abuse 04/15/2011  . Family history of premature CAD 04/15/2011   Past Medical History  Diagnosis Date  . Angina   . Hypertension   . Unstable angina 04/15/2011  . CAD (coronary artery disease), non obstructive CAD in 2010 04/15/2011  . Abnormal stress test, Lexiscan myoview 03/25/2011-sm. anterior ischemia 04/15/2011  . HTN (hypertension) 04/15/2011  . Dyslipidemia 04/15/2011  . Metabolic syndrome [id number]  . Tobacco abuse 04/15/2011  . Family history of premature CAD 04/15/2011  . Reflux   . Palpitations    Past Surgical History  Procedure Laterality Date  . Appendectomy    . Appendectomy    . Wrist fracture surgery      right  . Renal doppler  06/05/2009    No evidence of significant diameter reduction  . Cardiac catheterization  02/07/2008    Recommendation-F/U stress test  . Cardiac catheterization  04/16/2011    Recommendation-Increase medical therapy trial  . Cardiovascular stress test  03/25/2011    Suspect subtle anterior septal ischemia  . Transthoracic echocardiogram  01/25/2008    EF 60-65%, Moderate concentric LVH  . Left heart catheterization with coronary angiogram N/A 04/16/2011    Procedure: LEFT HEART CATHETERIZATION WITH CORONARY ANGIOGRAM;  Surgeon: Troy Sine, MD;  Location: [facility] CATH LAB;  Service: Cardiovascular;  Laterality: N/A;  . Left heart catheterization with coronary angiogram N/A 09/01/2012    Procedure: LEFT HEART CATHETERIZATION WITH  CORONARY ANGIOGRAM;  Surgeon: Pixie Casino, MD;  Location: [facility] CATH LAB;  Service: Cardiovascular;  Laterality: N/A;   Allergies  Allergen Reactions  . Bupropion     Other reaction(s): Other Suicidal thoughts and increased depression  . Rosuvastatin Other (See Comments)    myalgia  . Simvastatin     myalgias  . Wellbutrin [Bupropion Hcl]     unknown  . Zetia [Ezetimibe]     myalgias   Prior to Admission medications   Medication Sig Start Date End Date Taking? Authorizing Provider  amLODipine (NORVASC) 10 MG  tablet TAKE ONE TABLET BY MOUTH ONCE DAILY 01/14/14  Yes Lorretta Harp, MD  aspirin 81 MG tablet Take 81 mg by mouth at bedtime.   Yes Historical Provider, MD  hydrALAZINE (APRESOLINE) 50 MG tablet Take 1 tablet (50 mg total) by mouth 2 (two) times daily. 11/19/13  Yes Lorretta Harp, MD  HYDROcodone-acetaminophen (NORCO/VICODIN) 5-325 MG per tablet Take 1 tablet by mouth every 6 (six) hours as needed for moderate pain or severe pain. 12/28/13  Yes Domenic Moras, PA-C  Inositol Niacinate (NIACIN FLUSH FREE PO) Take by mouth.   Yes Historical Provider, MD  isosorbide mononitrate (IMDUR) 30 MG 24 hr tablet Take 1 tablet (30 mg total) by mouth daily. 03/04/14  Yes Leonie Man, MD  lisinopril (PRINIVIL,ZESTRIL) 20 MG tablet Take 1 tablet (20 mg total) by mouth 2 (two) times daily. 03/04/14  Yes Leonie Man, MD  metoprolol (LOPRESSOR) 50 MG tablet Take 1 1/2 tablets twice a day 08/31/13  Yes Brittainy Erie Noe, PA-C  nitroGLYCERIN (NITROSTAT) 0.4 MG SL tablet Place 1 tablet (0.4 mg total) under the tongue every 5 (five) minutes as needed for chest pain. 09/02/12  Yes Brittainy Erie Noe, PA-C  triamterene-hydrochlorothiazide (MAXZIDE-25) 37.5-25 MG per tablet TAKE ONE TABLET BY MOUTH ONCE DAILY 01/14/14  Yes Lorretta Harp, MD   History   Social History  . Marital Status: Divorced    Spouse Name: N/A  . Number of Children: N/A  . Years of Education: N/A   Occupational History  . Not on file.   Social History Main Topics  . Smoking status: Current Every Day Smoker -- 0.25 packs/day for 30 years    Types: Cigarettes  . Smokeless tobacco: Not on file  . Alcohol Use: No  . Drug Use: No  . Sexual Activity: No   Other Topics Concern  . Not on file   Social History Narrative     Review of Systems  Respiratory: Negative for shortness of breath.   Cardiovascular: Positive for leg swelling. Negative for chest pain.  Musculoskeletal: Negative for myalgias (no calf pain).  Neurological:  Positive for numbness.       Objective:   Physical Exam  Constitutional: She is oriented to person, place, and time. She appears well-developed and well-nourished. No distress.  HENT:  Head: Normocephalic and atraumatic.  Eyes: Conjunctivae and EOM are normal. Pupils are equal, round, and reactive to light.  Neck: Neck supple. Carotid bruit is not present. No tracheal deviation present. No thyromegaly present.  Cardiovascular: Normal rate, regular rhythm and intact distal pulses.  Exam reveals no gallop and no friction rub.   No murmur heard. Pulmonary/Chest: Effort normal and breath sounds normal. No respiratory distress.  Abdominal: Soft. She exhibits no pulsatile midline mass. There is no tenderness.  Musculoskeletal: Normal range of motion. She exhibits edema. She exhibits no tenderness.  1-2+ slight pitting edema in bilateral  LE, right greater than left, mid tibia distally. Calves are nontender. Toes are warm. Cap refill <1 second. Faint but palpable DP pulses bilaterally.   Calf Circumference Right: 43 cm, measured 15 cm below the patella  Left: 43 cm, measured 15 cm below the patella  Negative Homan's. Nontender.  Neurological: She is alert and oriented to person, place, and time.  Skin: Skin is warm and dry.  Verrucal appearing lesions on right lateral forearm, one on the right thumb. A few scattered slightly hyperpigmented, scaly, elevated lesions on her back.  Psychiatric: She has a normal mood and affect. Her behavior is normal.  Nursing note and vitals reviewed.   Filed Vitals:   05/02/14 1308  BP: 163/89  Pulse: 71  Temp: 98.2 F (36.8 C)  TempSrc: Oral  Resp: 16  Height: 5\' 4"  (1.626 m)  Weight: 233 lb (105.688 kg)  SpO2: 96%         Assessment & Plan:  [name] is a 54 y.o. female Bilateral leg edema, Peripheral edema, Right foot pain, obesity, History of recent travel - Plan: TSH, D-dimer, quantitative, Lower Extremity Venous Duplex Right,   -  suspected pedal edema with recent travel, heat and underlying obesity.   -check TSH, BMP, decrease salty foods, other sx care as below.   -With recent travel, intermittent pain into foot, and worse on right - checked D dimer.   -elevated D Dimer -= ultrasound ordered for am of 05/03/14. Called pt and advised if any chest pain/dyspnea - eval in ER.   Coronary artery disease involving native coronary artery of native heart without angina pectoris -   -most recent cath with normal LV fxn. Consider echo if edema persists.    Common wart  - suspected warts on arms, trunk, but may be seborrheic keratoses on back.  Planned on following this up at next visit to further discuss +/- cryodestruction or biopsy if needed.    No orders of the defined types were placed in this encounter.   Patient Instructions  See information about swelling below. Decrease sodium (fast food, frozen food often has higher quantities of this). Elevate legs when seated. Over time, weight loss can also lessen this.  You should receive a call or letter about your lab results within the next week to 10 days. If swelling not improving in next 10 days - return for recheck as we may need to check echo of your heart.  Follow up with your cardiologist as planned, and return to discuss warts and other areas of your skin further - with Chelle or myself.   Return to the clinic or go to the nearest emergency room if any of your symptoms worsen or new symptoms occur.  Peripheral Edema You have swelling in your legs (peripheral edema). This swelling is due to excess accumulation of salt and water in your body. Edema may be a sign of heart, kidney or liver disease, or a side effect of a medication. It may also be due to problems in the leg veins. Elevating your legs and using special support stockings may be very helpful, if the cause of the swelling is due to poor venous circulation. Avoid long periods of standing, whatever the  cause. Treatment of edema depends on identifying the cause. Chips, pretzels, pickles and other salty foods should be avoided. Restricting salt in your diet is almost always needed. Water pills (diuretics) are often used to remove the excess salt and water from your body via  urine. These medicines prevent the kidney from reabsorbing sodium. This increases urine flow. Diuretic treatment may also result in lowering of potassium levels in your body. Potassium supplements may be needed if you have to use diuretics daily. Daily weights can help you keep track of your progress in clearing your edema. You should call your caregiver for follow up care as recommended. SEEK IMMEDIATE MEDICAL CARE IF:   You have increased swelling, pain, redness, or heat in your legs.  You develop shortness of breath, especially when lying down.  You develop chest or abdominal pain, weakness, or fainting.  You have a fever. Document Released: 02/05/2004 Document Revised: 03/22/2011 Document Reviewed: 01/15/2009 [facility] Patient Information 2015 [place], [place]. This information is not intended to replace advice given to you by your health care provider. Make sure you discuss any questions you have with your health care provider.      I personally performed the services described in this documentation, which was scribed in my presence. The recorded information has been reviewed and considered, and addended by me as needed.

## 2014-05-03 ENCOUNTER — Ambulatory Visit (HOSPITAL_COMMUNITY)
Admission: RE | Admit: 2014-05-03 | Discharge: 2014-05-03 | Disposition: A | Discharge status: Home / Self Care | Payer: Managed Care, Other (non HMO) | Source: Ambulatory Visit | Attending: Family Medicine | Admitting: Family Medicine

## 2014-05-03 DIAGNOSIS — M79671 Pain in right foot: Secondary | ICD-10-CM | POA: Insufficient documentation

## 2014-05-03 DIAGNOSIS — E669 Obesity, unspecified: Secondary | ICD-10-CM | POA: Insufficient documentation

## 2014-05-03 DIAGNOSIS — F17200 Nicotine dependence, unspecified, uncomplicated: Secondary | ICD-10-CM | POA: Insufficient documentation

## 2014-05-03 DIAGNOSIS — R600 Localized edema: Secondary | ICD-10-CM | POA: Diagnosis not present

## 2014-05-03 DIAGNOSIS — Z789 Other specified health status: Secondary | ICD-10-CM | POA: Insufficient documentation

## 2014-05-03 DIAGNOSIS — Z720 Tobacco use: Secondary | ICD-10-CM

## 2014-05-03 DIAGNOSIS — R7989 Other specified abnormal findings of blood chemistry: Secondary | ICD-10-CM

## 2014-05-03 DIAGNOSIS — R791 Abnormal coagulation profile: Secondary | ICD-10-CM | POA: Diagnosis not present

## 2014-05-03 DIAGNOSIS — Z6830 Body mass index (BMI) 30.0-30.9, adult: Secondary | ICD-10-CM | POA: Insufficient documentation

## 2014-05-03 LAB — BASIC METABOLIC PANEL
BUN: 23 mg/dL (ref 6–23)
CO2: 25 meq/L (ref 19–32)
CREATININE: 1.15 mg/dL — AB (ref 0.50–1.10)
Calcium: 8.9 mg/dL (ref 8.4–10.5)
Chloride: 102 mEq/L (ref 96–112)
Glucose, Bld: 104 mg/dL — ABNORMAL HIGH (ref 70–99)
Potassium: 4.1 mEq/L (ref 3.5–5.3)
Sodium: 140 mEq/L (ref 135–145)

## 2014-05-03 LAB — TSH: TSH: 2.53 u[IU]/mL (ref 0.350–4.500)

## 2014-05-03 NOTE — Progress Notes (Signed)
Right lower extremity venous duplex completed.  Right:  No evidence of DVT, superficial thrombosis, or Baker's cyst.  Left:  Negative for DVT in the common femoral vein.  

## 2014-12-18 ENCOUNTER — Emergency Department (HOSPITAL_COMMUNITY): Payer: Managed Care, Other (non HMO)

## 2014-12-18 ENCOUNTER — Ambulatory Visit (INDEPENDENT_AMBULATORY_CARE_PROVIDER_SITE_OTHER): Payer: Managed Care, Other (non HMO) | Admitting: Emergency Medicine

## 2014-12-18 ENCOUNTER — Encounter (HOSPITAL_COMMUNITY): Payer: Self-pay | Admitting: Emergency Medicine

## 2014-12-18 ENCOUNTER — Observation Stay (HOSPITAL_COMMUNITY)
Admission: EM | Admit: 2014-12-18 | Discharge: 2014-12-19 | Disposition: A | Discharge status: Home / Self Care | Payer: Managed Care, Other (non HMO) | Attending: Internal Medicine | Admitting: Internal Medicine

## 2014-12-18 VITALS — BP 150/84 | HR 70 | Temp 98.1°F | Resp 18 | Ht 63.0 in | Wt 228.2 lb

## 2014-12-18 DIAGNOSIS — R112 Nausea with vomiting, unspecified: Secondary | ICD-10-CM | POA: Insufficient documentation

## 2014-12-18 DIAGNOSIS — J069 Acute upper respiratory infection, unspecified: Secondary | ICD-10-CM | POA: Diagnosis not present

## 2014-12-18 DIAGNOSIS — Z720 Tobacco use: Secondary | ICD-10-CM

## 2014-12-18 DIAGNOSIS — I25119 Atherosclerotic heart disease of native coronary artery with unspecified angina pectoris: Secondary | ICD-10-CM | POA: Diagnosis not present

## 2014-12-18 DIAGNOSIS — R51 Headache: Secondary | ICD-10-CM | POA: Insufficient documentation

## 2014-12-18 DIAGNOSIS — I200 Unstable angina: Secondary | ICD-10-CM | POA: Diagnosis not present

## 2014-12-18 DIAGNOSIS — I2510 Atherosclerotic heart disease of native coronary artery without angina pectoris: Secondary | ICD-10-CM

## 2014-12-18 DIAGNOSIS — Z7982 Long term (current) use of aspirin: Secondary | ICD-10-CM | POA: Diagnosis not present

## 2014-12-18 DIAGNOSIS — E669 Obesity, unspecified: Secondary | ICD-10-CM

## 2014-12-18 DIAGNOSIS — R0602 Shortness of breath: Secondary | ICD-10-CM | POA: Insufficient documentation

## 2014-12-18 DIAGNOSIS — I10 Essential (primary) hypertension: Secondary | ICD-10-CM | POA: Diagnosis not present

## 2014-12-18 DIAGNOSIS — R0781 Pleurodynia: Secondary | ICD-10-CM | POA: Diagnosis not present

## 2014-12-18 DIAGNOSIS — Z79899 Other long term (current) drug therapy: Secondary | ICD-10-CM | POA: Diagnosis not present

## 2014-12-18 DIAGNOSIS — I25110 Atherosclerotic heart disease of native coronary artery with unstable angina pectoris: Secondary | ICD-10-CM

## 2014-12-18 DIAGNOSIS — E8881 Metabolic syndrome: Secondary | ICD-10-CM | POA: Diagnosis not present

## 2014-12-18 DIAGNOSIS — R079 Chest pain, unspecified: Principal | ICD-10-CM

## 2014-12-18 DIAGNOSIS — S3012XA Contusion of groin, initial encounter: Secondary | ICD-10-CM | POA: Diagnosis present

## 2014-12-18 DIAGNOSIS — S301XXA Contusion of abdominal wall, initial encounter: Secondary | ICD-10-CM | POA: Diagnosis present

## 2014-12-18 DIAGNOSIS — E785 Hyperlipidemia, unspecified: Secondary | ICD-10-CM | POA: Diagnosis not present

## 2014-12-18 DIAGNOSIS — K219 Gastro-esophageal reflux disease without esophagitis: Secondary | ICD-10-CM | POA: Insufficient documentation

## 2014-12-18 DIAGNOSIS — F17210 Nicotine dependence, cigarettes, uncomplicated: Secondary | ICD-10-CM | POA: Insufficient documentation

## 2014-12-18 DIAGNOSIS — Z8249 Family history of ischemic heart disease and other diseases of the circulatory system: Secondary | ICD-10-CM | POA: Diagnosis not present

## 2014-12-18 DIAGNOSIS — E66813 Obesity, class 3: Secondary | ICD-10-CM | POA: Diagnosis present

## 2014-12-18 DIAGNOSIS — I209 Angina pectoris, unspecified: Secondary | ICD-10-CM | POA: Diagnosis present

## 2014-12-18 DIAGNOSIS — R0789 Other chest pain: Secondary | ICD-10-CM | POA: Diagnosis not present

## 2014-12-18 LAB — CREATININE, SERUM
Creatinine, Ser: 1.35 mg/dL — ABNORMAL HIGH (ref 0.44–1.00)
GFR, EST AFRICAN AMERICAN: 51 mL/min — AB (ref >60)
GFR, EST NON AFRICAN AMERICAN: 44 mL/min — AB (ref >60)

## 2014-12-18 LAB — BASIC METABOLIC PANEL
ANION GAP: 9 (ref 5–15)
BUN: 17 mg/dL (ref 6–20)
CHLORIDE: 103 mmol/L (ref 101–111)
CO2: 26 mmol/L (ref 22–32)
Calcium: 8.8 mg/dL — ABNORMAL LOW (ref 8.9–10.3)
Creatinine, Ser: 1.33 mg/dL — ABNORMAL HIGH (ref 0.44–1.00)
GFR calc non Af Amer: 45 mL/min — ABNORMAL LOW (ref >60)
GFR, EST AFRICAN AMERICAN: 52 mL/min — AB (ref >60)
GLUCOSE: 109 mg/dL — AB (ref 65–99)
POTASSIUM: 4 mmol/L (ref 3.5–5.1)
Sodium: 138 mmol/L (ref 135–145)

## 2014-12-18 LAB — HEPATIC FUNCTION PANEL
ALK PHOS: 40 U/L (ref 38–126)
ALT: 75 U/L — ABNORMAL HIGH (ref 14–54)
AST: 68 U/L — ABNORMAL HIGH (ref 15–41)
Albumin: 3.6 g/dL (ref 3.5–5.0)
BILIRUBIN INDIRECT: 0.5 mg/dL (ref 0.3–0.9)
BILIRUBIN TOTAL: 0.6 mg/dL (ref 0.3–1.2)
Bilirubin, Direct: 0.1 mg/dL (ref 0.1–0.5)
Total Protein: 7.4 g/dL (ref 6.5–8.1)

## 2014-12-18 LAB — CBC
HEMATOCRIT: 37.3 % (ref 36.0–46.0)
Hemoglobin: 12 g/dL (ref 12.0–15.0)
MCH: 27.7 pg (ref 26.0–34.0)
MCHC: 32.2 g/dL (ref 30.0–36.0)
MCV: 86.1 fL (ref 78.0–100.0)
Platelets: 220 10*3/uL (ref 150–400)
RBC: 4.33 MIL/uL (ref 3.87–5.11)
RDW: 14.3 % (ref 11.5–15.5)
WBC: 7.5 10*3/uL (ref 4.0–10.5)

## 2014-12-18 LAB — I-STAT TROPONIN, ED
Troponin i, poc: 0.01 ng/mL (ref 0.00–0.08)
Troponin i, poc: 0 ng/mL (ref 0.00–0.08)

## 2014-12-18 LAB — LIPID PANEL
CHOLESTEROL: 197 mg/dL (ref 0–200)
HDL: 27 mg/dL — ABNORMAL LOW (ref >40)
LDL Cholesterol: 102 mg/dL — ABNORMAL HIGH (ref 0–99)
Total CHOL/HDL Ratio: 7.3 RATIO
Triglycerides: 342 mg/dL — ABNORMAL HIGH (ref <150)
VLDL: 68 mg/dL — ABNORMAL HIGH (ref 0–40)

## 2014-12-18 LAB — I-STAT BETA HCG BLOOD, ED (MC, WL, AP ONLY): I-stat hCG, quantitative: <5 m[IU]/mL (ref <5)

## 2014-12-18 LAB — TROPONIN I

## 2014-12-18 LAB — D-DIMER, QUANTITATIVE (NOT AT ARMC): D DIMER QUANT: 0.61 ug{FEU}/mL — AB (ref 0.00–0.50)

## 2014-12-18 MED ORDER — ASPIRIN EC 81 MG PO TBEC: 81.0000 mg | DELAYED_RELEASE_TABLET | Freq: Every day | ORAL | Status: DC

## 2014-12-18 MED ORDER — PREDNISONE 20 MG PO TABS
40.0000 mg | ORAL_TABLET | Freq: Two times a day (BID) | ORAL | Status: DC
  Administered 2014-12-18 – 2014-12-19 (×2): 40 mg via ORAL
  Filled 2014-12-18 (×2): qty 2

## 2014-12-18 MED ORDER — METOPROLOL TARTRATE 25 MG PO TABS: 25.0000 mg | ORAL_TABLET | Freq: Two times a day (BID) | ORAL | Status: DC

## 2014-12-18 MED ORDER — SODIUM CHLORIDE 0.9 % IV SOLN
INTRAVENOUS | Status: DC
Start: 2014-12-18 — End: 2014-12-19

## 2014-12-18 MED ORDER — IOHEXOL 350 MG/ML SOLN
75.0000 mL | Freq: Once | INTRAVENOUS | Status: AC | PRN
  Administered 2014-12-18: 100 mL via INTRAVENOUS

## 2014-12-18 MED ORDER — LISINOPRIL 20 MG PO TABS
20.0000 mg | ORAL_TABLET | Freq: Two times a day (BID) | ORAL | Status: DC
  Administered 2014-12-18 – 2014-12-19 (×2): 20 mg via ORAL
  Filled 2014-12-18 (×2): qty 1

## 2014-12-18 MED ORDER — ONDANSETRON HCL 4 MG/2ML IJ SOLN: 4.0000 mg | Freq: Four times a day (QID) | INTRAMUSCULAR | Status: DC | PRN

## 2014-12-18 MED ORDER — AMLODIPINE BESYLATE 10 MG PO TABS
10.0000 mg | ORAL_TABLET | Freq: Every day | ORAL | Status: DC
  Administered 2014-12-18: 10 mg via ORAL
  Filled 2014-12-18: qty 1

## 2014-12-18 MED ORDER — NICOTINE 14 MG/24HR TD PT24
14.0000 mg | MEDICATED_PATCH | Freq: Every day | TRANSDERMAL | Status: DC
  Administered 2014-12-18 – 2014-12-19 (×2): 14 mg via TRANSDERMAL
  Filled 2014-12-18 (×2): qty 1

## 2014-12-18 MED ORDER — HEPARIN SODIUM (PORCINE) 5000 UNIT/ML IJ SOLN
5000.0000 [IU] | Freq: Three times a day (TID) | INTRAMUSCULAR | Status: DC
  Administered 2014-12-18 – 2014-12-19 (×2): 5000 [IU] via SUBCUTANEOUS
  Filled 2014-12-18 (×2): qty 1

## 2014-12-18 MED ORDER — GI COCKTAIL ~~LOC~~: 30.0000 mL | Freq: Four times a day (QID) | ORAL | Status: DC | PRN

## 2014-12-18 MED ORDER — ASPIRIN 81 MG PO CHEW
324.0000 mg | CHEWABLE_TABLET | Freq: Once | ORAL | Status: AC
  Administered 2014-12-18: 324 mg via ORAL

## 2014-12-18 MED ORDER — IPRATROPIUM-ALBUTEROL 0.5-2.5 (3) MG/3ML IN SOLN
3.0000 mL | Freq: Four times a day (QID) | RESPIRATORY_TRACT | Status: DC
  Administered 2014-12-18 (×2): 3 mL via RESPIRATORY_TRACT
  Filled 2014-12-18 (×2): qty 3

## 2014-12-18 MED ORDER — ACETAMINOPHEN 325 MG PO TABS: 650.0000 mg | ORAL_TABLET | ORAL | Status: DC | PRN

## 2014-12-18 MED ORDER — METOPROLOL TARTRATE 50 MG PO TABS
50.0000 mg | ORAL_TABLET | Freq: Two times a day (BID) | ORAL | Status: DC
  Administered 2014-12-18 – 2014-12-19 (×2): 50 mg via ORAL
  Filled 2014-12-18 (×2): qty 1

## 2014-12-18 MED ORDER — GUAIFENESIN-DM 100-10 MG/5ML PO SYRP
5.0000 mL | ORAL_SOLUTION | ORAL | Status: DC | PRN
  Administered 2014-12-18: 5 mL via ORAL
  Filled 2014-12-18: qty 5

## 2014-12-18 MED ORDER — LEVALBUTEROL HCL 1.25 MG/0.5ML IN NEBU: 1.2500 mg | INHALATION_SOLUTION | Freq: Four times a day (QID) | RESPIRATORY_TRACT | Status: DC | PRN

## 2014-12-18 MED ORDER — ISOSORBIDE MONONITRATE ER 30 MG PO TB24
30.0000 mg | ORAL_TABLET | Freq: Every day | ORAL | Status: DC
  Administered 2014-12-18: 30 mg via ORAL
  Filled 2014-12-18: qty 1

## 2014-12-18 MED ORDER — HYDRALAZINE HCL 50 MG PO TABS
50.0000 mg | ORAL_TABLET | Freq: Two times a day (BID) | ORAL | Status: DC
  Administered 2014-12-18 – 2014-12-19 (×2): 50 mg via ORAL
  Filled 2014-12-18 (×2): qty 1

## 2014-12-18 NOTE — ED Notes (Signed)
Pt from [facility] via [facility] with c/o intermittent generalized chest pressure x 3 days worse with coughing, no change with exertion or rest.  Nonproductive cough x 2 weeks.  Lungs clear.  EKG NSR with t wave inversions.  Pt denies SOB, N/V, dizziness or other complaints.  Given 324 mg aspirin.  Denies pain at this time.  Pt in NAD, A&O.

## 2014-12-18 NOTE — ED Provider Notes (Signed)
I saw and evaluated the patient, reviewed the resident's note and I agree with the findings and plan.   EKG Interpretation   Date/Time:  Wednesday December 18 2014 11:09:37 EST Ventricular Rate:  68 PR Interval:  183 QRS Duration: 88 QT Interval:  453 QTC Calculation: 482 R Axis:   77 Text Interpretation:  Sinus rhythm Consider left atrial enlargement ST  depression and TWI in the lateral precordial and limb leads, seen on prior  EKG in 2010 but not in 2014 Confirmed by Jorita Bohanon MD, Shauntay Brunelli [id number]) on 12/18/2014  11:13:05 AM        54 year old female who presents with chest pain. She has a history of coronary artery disease, hypertension, hyperlipidemia, obesity, and tobacco use. States that for the past 3 days she has had dry cough, nasal congestion, mild scratchy throat, and headache. Over the course of the past 1-2 days she has developed a chest tightness and chest pressure. Pain has been constant, not worsened with exertion, seems more noticeable when laying down, and not similar to prior episodes of unstable angina. Pain also pleuritic in nature. No orthopnea, PND, lower extremity edema or weight gain. She is hypertensive on presentation, but does not require acute lowering of her blood pressure. She appears euvolemic on exam. She is not tachycardia, tachypnea, or with hypoxia.Remainder cardiopulmonary exam is unremarkable. Her EKG does suggest T-wave inversions and depressions in the lateral limb and precordial leads. She has had T-wave inversions in the lateral leads in the past. Troponin is negative and chest x-ray without acute cardiopulmonary processes. During ED course also developed a 2 L oxygen requirement. Lungs continued to sound clear, but I suspect that it is 2/2 underlying bronchitis type symptoms and long-standing smoking history. Given her O2 requirement and pleuritic pain w/ SOB, d-dimer was sent and positive. CT PE negative. Admitted to observation for ongoing respiratory treatments  and cardiac rule out.    Forde Dandy, MD 12/18/14 [phone]

## 2014-12-18 NOTE — ED Notes (Signed)
Pt decreased SpO2 to 82-87% RA.  Pt reports having worsening SOB since this RN was last in the room.  Placed pt on 2 L nasal cannula with increase to 91%.  Notified Dr Oleta Mouse.

## 2014-12-18 NOTE — Consult Note (Addendum)
CONSULTATION NOTE  Reason for Consult: Chest pain  Requesting Physician: Dr. Allyson Sabal  Cardiologist: Dr. Gwenlyn Found  HPI: This is a 54 y.o. female with a past medical history significant for coronary artery disease. In 2013 she underwent left heart catheterization which demonstrated a 60% LAD stenosis and 50% mid RCA stenosis as well as a 70% distal RCA stenosis with normal LV function. A repeat cath in 2014 revealed no change in her anatomy. Since then she's been on medical therapy. She presents today to the ER with chest pain. She says for the past several day she's had a dry cough, nasal congestion and a mild scratchy throat as well as headache. Today she's developed some chest tightness and pressure. She says it's somewhat constant and not worse with change in position. There is somewhat of a pleuritic nature to the pain as well. EKG demonstrates new T-wave inversions and lateral ST depressions, similar to findings in the past. Initial troponin is negative. Chest x-ray shows no acute pulmonary process. Cardiology is asked to evaluate for possible unstable angina.  PMHx:  Past Medical History  Diagnosis Date  . Angina   . Hypertension   . Unstable angina ([place]) 04/15/2011  . CAD (coronary artery disease), non obstructive CAD in 2010 04/15/2011  . Abnormal stress test, Lexiscan myoview 03/25/2011-sm. anterior ischemia 04/15/2011  . HTN (hypertension) 04/15/2011  . Dyslipidemia 04/15/2011  . Metabolic syndrome 07/15/9447  . Tobacco abuse 04/15/2011  . Family history of premature CAD 04/15/2011  . Reflux   . Palpitations    Past Surgical History  Procedure Laterality Date  . Appendectomy    . Appendectomy    . Wrist fracture surgery      right  . Renal doppler  06/05/2009    No evidence of significant diameter reduction  . Cardiac catheterization  02/07/2008    Recommendation-F/U stress test  . Cardiac catheterization  04/16/2011    Recommendation-Increase medical therapy trial  . Cardiovascular  stress test  03/25/2011    Suspect subtle anterior septal ischemia  . Transthoracic echocardiogram  01/25/2008    EF 60-65%, Moderate concentric LVH  . Left heart catheterization with coronary angiogram N/A 04/16/2011    Procedure: LEFT HEART CATHETERIZATION WITH CORONARY ANGIOGRAM;  Surgeon: Troy Sine, MD;  Location: [facility] CATH LAB;  Service: Cardiovascular;  Laterality: N/A;  . Left heart catheterization with coronary angiogram N/A 09/01/2012    Procedure: LEFT HEART CATHETERIZATION WITH CORONARY ANGIOGRAM;  Surgeon: Pixie Casino, MD;  Location: [facility] CATH LAB;  Service: Cardiovascular;  Laterality: N/A;    FAMHx: Family History  Problem Relation Age of Onset  . Stroke Father   . Hypertension Father   . Coronary artery disease Brother   . Heart attack Maternal Aunt 41  . Stroke Maternal Grandmother   . Cancer Maternal Grandmother     Breast cancer  . Hypertension Maternal Grandfather   . Heart attack Maternal Grandfather     SOCHx:  reports that she has been smoking Cigarettes.  She has a 7.5 pack-year smoking history. She does not have any smokeless tobacco history on file. She reports that she does not drink alcohol or use illicit drugs.  ALLERGIES: Allergies  Allergen Reactions  . Bupropion     Other reaction(s): Other Suicidal thoughts and increased depression  . Rosuvastatin Other (See Comments)    myalgia  . Simvastatin     myalgias  . Wellbutrin [Bupropion Hcl]     unknown  . Zetia [Ezetimibe]  myalgias    ROS: A comprehensive review of systems was negative except for: Constitutional: positive for fatigue Ears, nose, mouth, throat, and face: positive for nasal congestion and sore throat Respiratory: positive for cough and dyspnea on exertion Cardiovascular: positive for chest pain  HOME MEDICATIONS:   Medication List    ASK your doctor about these medications        amLODipine 10 MG tablet  Commonly known as:  NORVASC  TAKE ONE TABLET BY MOUTH ONCE  DAILY     aspirin 81 MG tablet  Take 81 mg by mouth at bedtime.     hydrALAZINE 50 MG tablet  Commonly known as:  APRESOLINE  Take 1 tablet (50 mg total) by mouth 2 (two) times daily.     isosorbide mononitrate 30 MG 24 hr tablet  Commonly known as:  IMDUR  Take 1 tablet (30 mg total) by mouth daily.     lisinopril 20 MG tablet  Commonly known as:  PRINIVIL,ZESTRIL  Take 1 tablet (20 mg total) by mouth 2 (two) times daily.     metoprolol 50 MG tablet  Commonly known as:  LOPRESSOR  Take 1 1/2 tablets twice a day     NIACIN FLUSH FREE PO  Take 1 tablet by mouth daily.     nitroGLYCERIN 0.4 MG SL tablet  Commonly known as:  NITROSTAT  Place 1 tablet (0.4 mg total) under the tongue every 5 (five) minutes as needed for chest pain.     triamterene-hydrochlorothiazide 37.5-25 MG tablet  Commonly known as:  MAXZIDE-25  TAKE ONE TABLET BY MOUTH ONCE DAILY        HOSPITAL MEDICATIONS: I have reviewed the patient's current medications.  VITALS: Blood pressure 150/68, pulse 65, temperature 97.8 F (36.6 C), temperature source Oral, resp. rate 12, SpO2 95 %.  PHYSICAL EXAM: General appearance: alert, no distress and moderately obese Neck: no carotid bruit, no JVD and face is flushed Lungs: diminished breath sounds bilaterally and rhonchi bilaterally Heart: regular rate and rhythm Abdomen: soft, non-tender; bowel sounds normal; no masses,  no organomegaly Extremities: extremities normal, atraumatic, no cyanosis or edema Pulses: 2+ and symmetric Skin: facial flushing Neurologic: Grossly normal Psych: Appears tired  LABS: Results for orders placed or performed during the hospital encounter of 12/18/14 (from the past 48 hour(s))  Basic metabolic panel     Status: Abnormal   Collection Time: 12/18/14 11:49 AM  Result Value Ref Range   Sodium 138 135 - 145 mmol/L   Potassium 4.0 3.5 - 5.1 mmol/L   Chloride 103 101 - 111 mmol/L   CO2 26 22 - 32 mmol/L   Glucose, Bld 109  (H) 65 - 99 mg/dL   BUN 17 6 - 20 mg/dL   Creatinine, Ser 1.33 (H) 0.44 - 1.00 mg/dL   Calcium 8.8 (L) 8.9 - 10.3 mg/dL   GFR calc non Af Amer 45 (L) >60 mL/min   GFR calc Af Amer 52 (L) >60 mL/min    Comment: (NOTE) The eGFR has been calculated using the CKD EPI equation. This calculation has not been validated in all clinical situations. eGFR's persistently <60 mL/min signify possible Chronic Kidney Disease.    Anion gap 9 5 - 15  CBC     Status: None   Collection Time: 12/18/14 11:49 AM  Result Value Ref Range   WBC 7.5 4.0 - 10.5 K/uL   RBC 4.33 3.87 - 5.11 MIL/uL   Hemoglobin 12.0 12.0 - 15.0 g/dL  HCT 37.3 36.0 - 46.0 %   MCV 86.1 78.0 - 100.0 fL   MCH 27.7 26.0 - 34.0 pg   MCHC 32.2 30.0 - 36.0 g/dL   RDW 14.3 11.5 - 15.5 %   Platelets 220 150 - 400 K/uL  D-dimer, quantitative (not at [facility])     Status: Abnormal   Collection Time: 12/18/14 11:49 AM  Result Value Ref Range   D-Dimer, Quant 0.61 (H) 0.00 - 0.50 ug/mL-FEU    Comment: (NOTE) At the manufacturer cut-off of 0.50 ug/mL FEU, this assay has been documented to exclude PE with a sensitivity and negative predictive value of 97 to 99%.  At this time, this assay has not been approved by the FDA to exclude DVT/VTE. Results should be correlated with clinical presentation.   I-Stat Beta hCG blood, ED ([facility], [facility], [facility] only)     Status: None   Collection Time: 12/18/14 11:54 AM  Result Value Ref Range   I-stat hCG, quantitative <5.0 <5 mIU/mL   Comment 3            Comment:   GEST. AGE      CONC.  (mIU/mL)   <=1 WEEK        5 - 50     2 WEEKS       50 - 500     3 WEEKS       100 - 10,000     4 WEEKS     1,000 - 30,000        FEMALE AND NON-PREGNANT FEMALE:     LESS THAN 5 mIU/mL   I-stat troponin, ED (not at [facility], [facility])     Status: None   Collection Time: 12/18/14 11:55 AM  Result Value Ref Range   Troponin i, poc 0.01 0.00 - 0.08 ng/mL   Comment 3            Comment: Due to the release kinetics of cTnI, a  negative result within the first hours of the onset of symptoms does not rule out myocardial infarction with certainty. If myocardial infarction is still suspected, repeat the test at appropriate intervals.   I-Stat Troponin, ED (not at [facility])     Status: None   Collection Time: 12/18/14  3:25 PM  Result Value Ref Range   Troponin i, poc 0.00 0.00 - 0.08 ng/mL   Comment 3            Comment: Due to the release kinetics of cTnI, a negative result within the first hours of the onset of symptoms does not rule out myocardial infarction with certainty. If myocardial infarction is still suspected, repeat the test at appropriate intervals.     IMAGING: Dg Chest 2 View  12/18/2014  CLINICAL DATA:  Bilateral upper chest pain, shortness of breath for 2 days EXAM: CHEST  2 VIEW COMPARISON:  08/30/2012 FINDINGS: Cardiomediastinal silhouette is stable. Calcified granuloma in right upper lobe is stable. No acute infiltrate or pleural effusion. No pulmonary edema. Bony thorax is unremarkable. IMPRESSION: No active cardiopulmonary disease. Electronically Signed   By: Lahoma Crocker M.D.   On: 12/18/2014 12:17   Ct Angio Chest Pe W/cm &/or Wo Cm  12/18/2014  CLINICAL DATA:  Dry cough 3 days.  Chest tightness and pressure. EXAM: CT ANGIOGRAPHY CHEST WITH CONTRAST TECHNIQUE: Multidetector CT imaging of the chest was performed using the standard protocol during bolus administration of intravenous contrast. Multiplanar CT image reconstructions and MIPs were obtained to evaluate the  vascular anatomy. CONTRAST:  144m OMNIPAQUE IOHEXOL 350 MG/ML SOLN COMPARISON:  Chest radiograph earlier today.  CT chest 01/25/2008 FINDINGS: Mediastinum: No filling defects are noted within the pulmonary arterial tree to suggest pulmonary emboli. Heart size is normal. No pericardial fluid, thickening or calcification. No acute abnormality of the thoracic aorta or other great vessels of the mediastinum. Mild atheromatous change transverse  arch. LEFT main, LEFT anterior descending, and LEFT circumflex coronary artery calcification. Minor RIGHT main coronary artery calcification. No pathologically enlarged mediastinal or hilar lymph nodes. The esophagus is normal in appearance. Lungs/Pleura: No consolidative airspace disease. No pleural effusions. No suspicious appearing pulmonary nodules or masses. 4 mm and 5 mm RIGHT upper lobe granulomas, stable Upper Abdomen: Visualized portions of the upper abdomen are unremarkable. Extensive BILATERAL renal cystic disease, incompletely evaluated, but grossly similar to the appearance in 2010. Suspected nephrolithiasis on the RIGHT. Sliding-type hiatal hernia. Musculoskeletal: No aggressive appearing lytic or blastic lesions are noted in the visualized portions of the skeleton. Minor thoracic spondylosis. Review of the MIP images confirms the above findings. IMPRESSION: No evidence for pulmonary emboli or consolidative airspace disease. Three vessel coronary artery calcification. Extensive BILATERAL renal cystic disease, incompletely evaluated, but probably stable. Suspected RIGHT renal nephrolithiasis nonobstructive. Electronically Signed   By: JStaci RighterM.D.   On: 12/18/2014 15:49    HOSPITAL DIAGNOSES: Principal Problem:   Chest pain Active Problems:   CAD (coronary artery disease), non obstructive CAD in 2010, no change with cath 09/01/12   HTN (hypertension)   Dyslipidemia- low HDL   Tobacco abuse   Family history of premature CAD   Obesity (BMI 30-39.9)   Hematoma of groin - right, s/p cath; stable on discharge   IMPRESSION: 1. Atypical chest pain, with known moderate coronary artery disease 2. Probable viral upper respiratory infection  RECOMMENDATION: 1. Mr. RKinnickhas a common cold. She has had congestion, rhinitis, cough and shortness of breath with pleuritic chest discomfort. Fortunately the chest x-ray does not show pneumonia. Symptoms do not really suggest coronary ischemia.  The EKG shows mild changes which are comparable to prior EKG in 2010. Although she has moderate to severe underlying coronary artery disease, she's not had any anginal symptoms over the past 2 years. Blood pressure is elevated however not unusual given her congestion and cough. She needs symptomatic treatment for cough and congestion. Reasonable to follow cardiac enzymes overnight. If they're negative, no further cardiac workup would be indicated at this time. I would not recommend stress testing or echocardiography as her symptoms are very atypical for angina.  Thanks for the consultation.   Time Spent Directly with Patient: 30 minutes  KPixie Casino MD, FPatients' Hospital Of ReddingAttending Cardiologist [place]tis[place]12/07/2014, 5:38 PM

## 2014-12-18 NOTE — ED Provider Notes (Signed)
CSN: [id number]     Arrival date & time 12/18/14  61 History   First MD Initiated Contact with Patient 12/18/14 1109     Chief Complaint  Patient presents with  . Chest Pain     (Consider location/radiation/quality/duration/timing/severity/associated sxs/prior Treatment) HPI Comments: Patient is a 54 year old female with a past medical history of coronary artery disease, hypertension, dyslipidemia, tobacco abuse, and obesity presenting to the ED with a chief complaint of chest pain. Patient states for the past 2-3 days she's been experiencing chest pain, shortness of breath, dry cough, nasal congestion, nausea, 1 episode of vomiting, and headaches. States her chest pain is better when she is sitting up and worse when lying down. States the pain "shoots across the back" when coughing. She describes the chest pain as "pressure-like", 2/ 10 in intensity, present both at rest and with exertion. Also endorses having pleuritic chest pain. Reports having similar symptoms a few weeks ago which resolved. States she has a history of chest pain in the past and has had several cardiac catheterizations done. She denies having any fevers, chills, abdominal pain, diarrhea, or constipation. 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 Denies any recent long distance travel. She has a family history of MI (maternal grandfather in his 68s) and stroke (father in his 67s). Patient is a current one half pack per day smoker and smoking since age of 18. Reports having chronic wheezing.  Patient does not have a primary care physician.   EKG showing new T wave inversions in lateral leads.     Patient is a 54 y.o. female presenting with chest pain. The history is provided by the patient.  Chest Pain Associated symptoms: cough, headache, nausea, shortness of breath and vomiting   Associated symptoms: no abdominal pain and  no fever     Past Medical History  Diagnosis Date  . Angina   . Hypertension   . Unstable angina ([place]) 04/15/2011  . CAD (coronary artery disease), non obstructive CAD in 2010 04/15/2011  . Abnormal stress test, Lexiscan myoview 03/25/2011-sm. anterior ischemia 04/15/2011  . HTN (hypertension) 04/15/2011  . Dyslipidemia 04/15/2011  . Metabolic syndrome 99991111  . Tobacco abuse 04/15/2011  . Family history of premature CAD 04/15/2011  . Reflux   . Palpitations    Past Surgical History  Procedure Laterality Date  . Appendectomy    . Appendectomy    . Wrist fracture surgery      right  . Renal doppler  06/05/2009    No evidence of significant diameter reduction  . Cardiac catheterization  02/07/2008    Recommendation-F/U stress test  . Cardiac catheterization  04/16/2011    Recommendation-Increase medical therapy trial  . Cardiovascular stress test  03/25/2011    Suspect subtle anterior septal ischemia  . Transthoracic echocardiogram  01/25/2008    EF 60-65%, Moderate concentric LVH  . Left heart catheterization with coronary angiogram N/A 04/16/2011    Procedure: LEFT HEART CATHETERIZATION WITH CORONARY ANGIOGRAM;  Surgeon: Troy Sine, MD;  Location: [facility] CATH LAB;  Service: Cardiovascular;  Laterality: N/A;  . Left heart catheterization with coronary angiogram N/A 09/01/2012    Procedure: LEFT HEART CATHETERIZATION WITH CORONARY ANGIOGRAM;  Surgeon: Pixie Casino, MD;  Location: [facility] CATH LAB;  Service: Cardiovascular;  Laterality: N/A;   Family History  Problem Relation Age of Onset  . Stroke Father   .  Hypertension Father   . Coronary artery disease Brother   . Heart attack Maternal Aunt 41  . Stroke Maternal Grandmother   . Cancer Maternal Grandmother     Breast cancer  . Hypertension Maternal Grandfather   . Heart attack Maternal Grandfather    Social History  Substance Use Topics  . Smoking status: Current Every Day Smoker -- 0.25 packs/day for 30 years    Types: Cigarettes  .  Smokeless tobacco: None  . Alcohol Use: No   OB History    No data available     Review of Systems  Constitutional: Negative for fever and chills.  HENT: Positive for congestion.   Respiratory: Positive for cough, shortness of breath and wheezing.   Cardiovascular: Positive for chest pain. Negative for leg swelling.  Gastrointestinal: Positive for nausea and vomiting. Negative for abdominal pain, diarrhea and constipation.  Musculoskeletal: Negative for myalgias and neck stiffness.  Neurological: Positive for headaches.      Allergies  Bupropion; Rosuvastatin; Simvastatin; Wellbutrin; and Zetia  Home Medications   Prior to Admission medications   Medication Sig Start Date End Date Taking? Authorizing Provider  amLODipine (NORVASC) 10 MG tablet TAKE ONE TABLET BY MOUTH ONCE DAILY 01/14/14   Lorretta Harp, MD  aspirin 81 MG tablet Take 81 mg by mouth at bedtime.    Historical Provider, MD  hydrALAZINE (APRESOLINE) 50 MG tablet Take 1 tablet (50 mg total) by mouth 2 (two) times daily. 11/19/13   Lorretta Harp, MD  Inositol Niacinate (NIACIN FLUSH FREE PO) Take by mouth.    Historical Provider, MD  isosorbide mononitrate (IMDUR) 30 MG 24 hr tablet Take 1 tablet (30 mg total) by mouth daily. 03/04/14   Leonie Man, MD  lisinopril (PRINIVIL,ZESTRIL) 20 MG tablet Take 1 tablet (20 mg total) by mouth 2 (two) times daily. 03/04/14   Leonie Man, MD  metoprolol (LOPRESSOR) 50 MG tablet Take 1 1/2 tablets twice a day 08/31/13   Brittainy Erie Noe, PA-C  nitroGLYCERIN (NITROSTAT) 0.4 MG SL tablet Place 1 tablet (0.4 mg total) under the tongue every 5 (five) minutes as needed for chest pain. 09/02/12   Brittainy Erie Noe, PA-C  triamterene-hydrochlorothiazide (MAXZIDE-25) 37.5-25 MG per tablet TAKE ONE TABLET BY MOUTH ONCE DAILY 01/14/14   Lorretta Harp, MD   BP 177/79 mmHg  Pulse 62  Temp(Src) 97.8 F (36.6 C) (Oral)  Resp 19  SpO2 95% Physical Exam  Constitutional: She is  oriented to person, place, and time. She appears well-developed and well-nourished. No distress.  HENT:  Head: Normocephalic and atraumatic.  Mouth/Throat: Oropharynx is clear and moist.  Eyes: EOM are normal. Pupils are equal, round, and reactive to light.  Neck: Neck supple. No tracheal deviation present.  Cardiovascular: Normal rate, regular rhythm and intact distal pulses.  Exam reveals no gallop and no friction rub.   Pulmonary/Chest: Effort normal and breath sounds normal. No respiratory distress. She has no wheezes. She has no rales.  Abdominal: Soft. Bowel sounds are normal. She exhibits no distension. There is no tenderness.  Musculoskeletal: She exhibits no edema.  Neurological: She is alert and oriented to person, place, and time.  Skin: Skin is warm and dry. She is not diaphoretic.    ED Course  Procedures (including critical care time) Labs Review Labs Reviewed  BASIC METABOLIC PANEL  CBC  I-STAT TROPOININ, ED  I-STAT BETA HCG BLOOD, ED ([facility], [facility], [facility] ONLY)    Imaging Review No results found.  I have personally reviewed and evaluated these images and lab results as part of my medical decision-making.   EKG Interpretation   Date/Time:  Wednesday December 18 2014 11:09:37 EST Ventricular Rate:  68 PR Interval:  183 QRS Duration: 88 QT Interval:  453 QTC Calculation: 482 R Axis:   77 Text Interpretation:  Sinus rhythm Consider left atrial enlargement ST  depression and TWI in the lateral precordial and limb leads, seen on prior  EKG in 2010 but not in 2014 Confirmed by LIU MD, DANA [phone]) on 12/18/2014  11:13:05 AM      MDM   Final diagnoses:  None   Chest pain Patient is presenting with a 2-3 day history of chest pain - 2/10 intensity, " pressure-like," CP present both at rest and with exertion. Also reports having a dry cough and SOB. Denies having any fevers or chills. Vitals stable, no fever, no tachypnea or tachycardia. She does have a history of chest  pains in the past and has underdone cardiac cath several times (no records seen in our system). Echo from 2010 showing LVEF 60-65% with concentric hypertrophy. EKG today showing T wave inversions in the lateral leads which are new compared to prior EKGs. Istat Troponin negative. CXR showing no active cardiopulmonary disease. She does endorse sitting at the computer for long periods of times for her job, however, denies having any pain or swelling in her lower extremities. Denies any recent long distance travel. D-dimer elevated at 0.61, however, CTA did not show PE. Her O2 saturation did decrease in the ED to the 80s while coughing. Patient was given Albuterol nebulizer treatment and put on 2L O2 via [place]. Is it best for the patient to be admitted for further workup of her chest pain. I discussed this with her and she agreed. [facility] hospitalist service has been consulted.   Shela Leff, MD 12/18/14 1556  Shela Leff, MD 12/18/14 Edgar Liu, MD 12/18/14 [phone]

## 2014-12-18 NOTE — ED Notes (Signed)
Ordered diet tray 

## 2014-12-18 NOTE — ED Notes (Signed)
Off unit with xray. 

## 2014-12-18 NOTE — H&P (Signed)
[facility] History and Physical  [name] [id number] DOB: September 06, 1960 DOA: 12/18/2014  Referring physician:  PCP: No PCP Per Patient   Chief Complaint: chest pain  HPI:  54 year old female who presents with chest pain. She has a history of coronary artery disease, hypertension, hyperlipidemia, obesity, and tobacco use. States that for the past 3 days she has had dry cough, nasal congestion, mild scratchy throat, and headache. Over the course of the past 1-2 days she has developed a chest tightness and chest pressure. Pain has been constant, not worsened with exertion, seems more noticeable when laying down, and not similar to prior episodes of unstable angina. Pain also pleuritic in nature. No orthopnea, PND, lower extremity edema or weight gain. She is hypertensive with systolic blood pressure in the 190s on presentation, but does not require acute lowering of her blood pressure. She appears euvolemic on exam. She is not tachycardia, tachypnea, or with hypoxia.Remainder cardiopulmonary exam is unremarkable. Her EKG does suggest T-wave inversions and depressions in the lateral limb and precordial leads. She has had T-wave inversions in the lateral leads in the past. Troponin is negative and chest x-ray without acute cardiopulmonary processes. During ED course also developed a 2 L oxygen requirement.  Patient also presented with complaints of pleuritic chest pain .Marland Kitchen EKG demonstrates new T-wave inversions and lateral ST depressions, similar to findings in the past. Initial troponin is negative. Chest x-ray shows no acute pulmonary process. Cardiology is asked to evaluate for possible unstable angina.  In 2013 she underwent left heart catheterization which demonstrated a 60% LAD stenosis and 50% mid RCA stenosis as well as a 70% distal RCA stenosis with normal LV function. A repeat cath in 2014 revealed no change in her anatomy. Since then she's been on medical therapy.           Review of Systems: negative for the following  Constitutional: Denies fever, chills, diaphoresis, appetite change and fatigue.  HEENT: Denies photophobia, eye pain, redness, hearing loss, ear pain, congestion, sore throat, rhinorrhea, sneezing, mouth sores, trouble swallowing, neck pain, neck stiffness and tinnitus.  Respiratory:   SOB, DOE, positive for cough, chest tightness, and wheezing. positive for shortness of breath  Cardiovascular: Denies chest pain, palpitations and leg swelling.  Gastrointestinal: Denies nausea, vomiting, abdominal pain, diarrhea, constipation, blood in stool and abdominal distention.  Genitourinary: Denies dysuria, urgency, frequency, hematuria, flank pain and difficulty urinating.  Musculoskeletal: Denies myalgias, back pain, joint swelling, arthralgias and gait problem.  Skin: Denies pallor, rash and wound.  Neurological: Denies dizziness, seizures, syncope, weakness, light-headedness, numbness and headaches.  Hematological: Denies adenopathy. Easy bruising, personal or family bleeding history  Psychiatric/Behavioral: Denies suicidal ideation, mood changes, confusion, nervousness, sleep disturbance and agitation       Past Medical History  Diagnosis Date  . Angina   . Hypertension   . Unstable angina ([place]) 04/15/2011  . CAD (coronary artery disease), non obstructive CAD in 2010 04/15/2011  . Abnormal stress test, Lexiscan myoview 03/25/2011-sm. anterior ischemia 04/15/2011  . HTN (hypertension) 04/15/2011  . Dyslipidemia 04/15/2011  . Metabolic syndrome 99991111  . Tobacco abuse 04/15/2011  . Family history of premature CAD 04/15/2011  . Reflux   . Palpitations      Past Surgical History  Procedure Laterality Date  . Appendectomy    . Appendectomy    . Wrist fracture surgery      right  . Renal doppler  06/05/2009    No evidence of significant diameter reduction  .  Cardiac catheterization  02/07/2008    Recommendation-F/U stress test  .  Cardiac catheterization  04/16/2011    Recommendation-Increase medical therapy trial  . Cardiovascular stress test  03/25/2011    Suspect subtle anterior septal ischemia  . Transthoracic echocardiogram  01/25/2008    EF 60-65%, Moderate concentric LVH  . Left heart catheterization with coronary angiogram N/A 04/16/2011    Procedure: LEFT HEART CATHETERIZATION WITH CORONARY ANGIOGRAM;  Surgeon: Troy Sine, MD;  Location: [facility] CATH LAB;  Service: Cardiovascular;  Laterality: N/A;  . Left heart catheterization with coronary angiogram N/A 09/01/2012    Procedure: LEFT HEART CATHETERIZATION WITH CORONARY ANGIOGRAM;  Surgeon: Pixie Casino, MD;  Location: [facility] CATH LAB;  Service: Cardiovascular;  Laterality: N/A;      Social History:  reports that she has been smoking Cigarettes.  She has a 7.5 pack-year smoking history. She does not have any smokeless tobacco history on file. She reports that she does not drink alcohol or use illicit drugs.    Allergies  Allergen Reactions  . Bupropion     Other reaction(s): Other Suicidal thoughts and increased depression  . Rosuvastatin Other (See Comments)    myalgia  . Simvastatin     myalgias  . Wellbutrin [Bupropion Hcl]     unknown  . Zetia [Ezetimibe]     myalgias    Family History  Problem Relation Age of Onset  . Stroke Father   . Hypertension Father   . Coronary artery disease Brother   . Heart attack Maternal Aunt 41  . Stroke Maternal Grandmother   . Cancer Maternal Grandmother     Breast cancer  . Hypertension Maternal Grandfather   . Heart attack Maternal Grandfather         Prior to Admission medications   Medication Sig Start Date End Date Taking? Authorizing Provider  amLODipine (NORVASC) 10 MG tablet TAKE ONE TABLET BY MOUTH ONCE DAILY 01/14/14  Yes Lorretta Harp, MD  aspirin 81 MG tablet Take 81 mg by mouth at bedtime.   Yes Historical Provider, MD  hydrALAZINE (APRESOLINE) 50 MG tablet Take 1 tablet (50 mg total) by  mouth 2 (two) times daily. 11/19/13  Yes Lorretta Harp, MD  Inositol Niacinate (NIACIN FLUSH FREE PO) Take 1 tablet by mouth daily.    Yes Historical Provider, MD  isosorbide mononitrate (IMDUR) 30 MG 24 hr tablet Take 1 tablet (30 mg total) by mouth daily. 03/04/14  Yes Leonie Man, MD  lisinopril (PRINIVIL,ZESTRIL) 20 MG tablet Take 1 tablet (20 mg total) by mouth 2 (two) times daily. 03/04/14  Yes Leonie Man, MD  metoprolol (LOPRESSOR) 50 MG tablet Take 1 1/2 tablets twice a day 08/31/13  Yes Brittainy Erie Noe, PA-C  triamterene-hydrochlorothiazide (MAXZIDE-25) 37.5-25 MG per tablet TAKE ONE TABLET BY MOUTH ONCE DAILY 01/14/14  Yes Lorretta Harp, MD  nitroGLYCERIN (NITROSTAT) 0.4 MG SL tablet Place 1 tablet (0.4 mg total) under the tongue every 5 (five) minutes as needed for chest pain. 09/02/12   Brittainy Erie Noe, PA-C     Physical Exam: Filed Vitals:   12/18/14 1500 12/18/14 1700 12/18/14 1730 12/18/14 1800  BP: 173/92 193/78 187/87 197/88  Pulse: 74 67 74 69  Temp:      TempSrc:      Resp: 21 17 14 20   SpO2: 96% 91% 93% 90%     Constitutional: Vital signs reviewed. Patient morbidly obese, inno acute distress and cooperative with exam. Alert and oriented  x3.  Head: Normocephalic and atraumatic  Ear: TM normal bilaterally  Mouth: no erythema or exudates, MMM  Eyes: PERRL, EOMI, conjunctivae normal, No scleral icterus.  Neck: Supple, Trachea midline normal ROM, No JVD, mass, thyromegaly, or carotid bruit present.  Cardiovascular: RRR, S1 normal, S2 normal, no MRG, pulses symmetric and intact bilaterally  Pulmonary/Chest:  Mild bibasilar wheezing, rales, or rhonchi  Abdominal: Soft. Non-tender, non-distended, bowel sounds are normal, no masses, organomegaly, or guarding present.  GU: no CVA tenderness Musculoskeletal: No joint deformities, erythema, or stiffness, ROM full and no nontender Ext: no edema and no cyanosis, pulses palpable bilaterally (DP and PT)   Hematology: no cervical, inginal, or axillary adenopathy.  Neurological: A&O x3, Strenght is normal and symmetric bilaterally, cranial nerve II-XII are grossly intact, no focal motor deficit, sensory intact to light touch bilaterally.  Skin: Warm, dry and intact. No rash, cyanosis, or clubbing.  Psychiatric: Normal mood and affect. speech and behavior is normal. Judgment and thought content normal. Cognition and memory are normal.      Data Review   Micro Results No results found for this or any previous visit (from the past 240 hour(s)).  Radiology Reports Dg Chest 2 View  12/18/2014  CLINICAL DATA:  Bilateral upper chest pain, shortness of breath for 2 days EXAM: CHEST  2 VIEW COMPARISON:  08/30/2012 FINDINGS: Cardiomediastinal silhouette is stable. Calcified granuloma in right upper lobe is stable. No acute infiltrate or pleural effusion. No pulmonary edema. Bony thorax is unremarkable. IMPRESSION: No active cardiopulmonary disease. Electronically Signed   By: Lahoma Crocker M.D.   On: 12/18/2014 12:17   Ct Angio Chest Pe W/cm &/or Wo Cm  12/18/2014  CLINICAL DATA:  Dry cough 3 days.  Chest tightness and pressure. EXAM: CT ANGIOGRAPHY CHEST WITH CONTRAST TECHNIQUE: Multidetector CT imaging of the chest was performed using the standard protocol during bolus administration of intravenous contrast. Multiplanar CT image reconstructions and MIPs were obtained to evaluate the vascular anatomy. CONTRAST:  151mL OMNIPAQUE IOHEXOL 350 MG/ML SOLN COMPARISON:  Chest radiograph earlier today.  CT chest 01/25/2008 FINDINGS: Mediastinum: No filling defects are noted within the pulmonary arterial tree to suggest pulmonary emboli. Heart size is normal. No pericardial fluid, thickening or calcification. No acute abnormality of the thoracic aorta or other great vessels of the mediastinum. Mild atheromatous change transverse arch. LEFT main, LEFT anterior descending, and LEFT circumflex coronary artery  calcification. Minor RIGHT main coronary artery calcification. No pathologically enlarged mediastinal or hilar lymph nodes. The esophagus is normal in appearance. Lungs/Pleura: No consolidative airspace disease. No pleural effusions. No suspicious appearing pulmonary nodules or masses. 4 mm and 5 mm RIGHT upper lobe granulomas, stable Upper Abdomen: Visualized portions of the upper abdomen are unremarkable. Extensive BILATERAL renal cystic disease, incompletely evaluated, but grossly similar to the appearance in 2010. Suspected nephrolithiasis on the RIGHT. Sliding-type hiatal hernia. Musculoskeletal: No aggressive appearing lytic or blastic lesions are noted in the visualized portions of the skeleton. Minor thoracic spondylosis. Review of the MIP images confirms the above findings. IMPRESSION: No evidence for pulmonary emboli or consolidative airspace disease. Three vessel coronary artery calcification. Extensive BILATERAL renal cystic disease, incompletely evaluated, but probably stable. Suspected RIGHT renal nephrolithiasis nonobstructive. Electronically Signed   By: Staci Righter M.D.   On: 12/18/2014 15:49     CBC  Recent Labs Lab 12/18/14 1149  WBC 7.5  HGB 12.0  HCT 37.3  PLT 220  MCV 86.1  MCH 27.7  MCHC 32.2  RDW  14.3    Chemistries   Recent Labs Lab 12/18/14 1149  NA 138  K 4.0  CL 103  CO2 26  GLUCOSE 109*  BUN 17  CREATININE 1.33*  CALCIUM 8.8*   ------------------------------------------------------------------------------------------------------------------ estimated creatinine clearance is 56.9 mL/min (by C-G formula based on Cr of 1.33). ------------------------------------------------------------------------------------------------------------------ No results for input(s): HGBA1C in the last 72 hours. ------------------------------------------------------------------------------------------------------------------ No results for input(s): CHOL, HDL, LDLCALC,  TRIG, CHOLHDL, LDLDIRECT in the last 72 hours. ------------------------------------------------------------------------------------------------------------------ No results for input(s): TSH, T4TOTAL, T3FREE, THYROIDAB in the last 72 hours.  Invalid input(s): FREET3 ------------------------------------------------------------------------------------------------------------------ No results for input(s): VITAMINB12, FOLATE, FERRITIN, TIBC, IRON, RETICCTPCT in the last 72 hours.  Coagulation profile No results for input(s): INR, PROTIME in the last 168 hours.   Recent Labs  12/18/14 1149  DDIMER 0.61*    Cardiac Enzymes No results for input(s): CKMB, TROPONINI, MYOGLOBIN in the last 168 hours.  Invalid input(s): CK ------------------------------------------------------------------------------------------------------------------ Invalid input(s): POCBNP   CBG: No results for input(s): GLUCAP in the last 168 hours.     EKG: Independently reviewed.  Date/Time: Wednesday December 18 2014 11:09:37 EST Ventricular Rate: 68 PR Interval: 183 QRS Duration: 88 QT Interval: 453 QTC Calculation: 482 R Axis: 77 Text Interpretation: Sinus rhythm Consider left atrial enlargement ST  depression and TWI in the lateral precordial and limb leads, seen on prior  EKG in 2010 but not in 2014    Assessment/Plan Principal Problem:   Chest pain-atypical, cardiology consulted, patient has moderate coronary artery disease, symptoms likely secondary to pleurisy as well as acute bronchitis, continue to cycle cardiac enzymes are negative no further cardiac workup indicated at this time. 2-D echo prior to discharge   Acute bronchitis, start patient on Xopenex nebulizer treatments, prednisone, no antibiotics indicated at this time, nicotine cessation counseling done, ruled out for pulmonary embolism    CAD (coronary artery disease), non obstructive CAD in 2010, no change with cath  09/01/12-management as above    HTN (hypertension)-uncontrolled, restart home medications and monitor    Dyslipidemia-check lipid panel    Tobacco abuse-smoking cessation counseling done, start nicotine patch      Obesity (BMI 30-39.9)-check lipid panel, hemoglobin A1c    Code Status:   full Family Communication: bedside Disposition Plan: admit   Total time spent 55 minutes.Greater than 50% of this time was spent in counseling, explanation of diagnosis, planning of further management, and coordination of care  [facility] Pager [phone]  If 7PM-7AM, please contact night-coverage [email or web address] Password [email or web address] 12/18/2014, 6:05 PM

## 2014-12-18 NOTE — ED Notes (Signed)
Pt SpO2 94-98% RA currently.  States breathing treatment has improved her SOB and pain significantly.

## 2014-12-18 NOTE — Progress Notes (Signed)
Subjective:  Patient ID: [name], female    DOB: 18-Dec-1960  Age: 54 y.o. MRN: [id number]  CC: Back Pain; Chest Pain; and Nausea   HPI SHARDELL BUSTAMANTE presents  patient has a 3 day history of intermittent chest pain of the chest tightness across her chest. Sometimes through to her back. She denies any history of injury or overuse. She has sensation of palpitations. No rapid heartbeat. Denies any shortness breath wheezing or cough. She denies any fever or chills. She does have a history of hypertension coronary artery disease and she is continues to smoke and has hyperlipidemia. She does not have diabetes.  History Kortny has a past medical history of Angina; Hypertension; Unstable angina ([place]) (04/15/2011); CAD (coronary artery disease), non obstructive CAD in 2010 (04/15/2011); Abnormal stress test, Lexiscan myoview 03/25/2011-sm. anterior ischemia (04/15/2011); HTN (hypertension) (04/15/2011); Dyslipidemia (04/15/2011); Metabolic syndrome (99991111); Tobacco abuse (04/15/2011); Family history of premature CAD (04/15/2011); Reflux; and Palpitations.   She has past surgical history that includes Appendectomy; Appendectomy; Wrist fracture surgery; RENAL DOPPLER (06/05/2009); Cardiac catheterization (02/07/2008); Cardiac catheterization (04/16/2011); Cardiovascular stress test (03/25/2011); transthoracic echocardiogram (01/25/2008); left heart catheterization with coronary angiogram (N/A, 04/16/2011); and left heart catheterization with coronary angiogram (N/A, 09/01/2012).   Her  family history includes Cancer in her maternal grandmother; Coronary artery disease in her brother; Heart attack in her maternal grandfather; Heart attack (age of onset: 64) in her maternal aunt; Hypertension in her father and maternal grandfather; Stroke in her father and maternal grandmother.  She   reports that she has been smoking Cigarettes.  She has a 7.5 pack-year smoking history. She does not have any smokeless tobacco  history on file. She reports that she does not drink alcohol or use illicit drugs.  Outpatient Prescriptions Prior to Visit  Medication Sig Dispense Refill  . amLODipine (NORVASC) 10 MG tablet TAKE ONE TABLET BY MOUTH ONCE DAILY 90 tablet 3  . aspirin 81 MG tablet Take 81 mg by mouth at bedtime.    . hydrALAZINE (APRESOLINE) 50 MG tablet Take 1 tablet (50 mg total) by mouth 2 (two) times daily. 180 tablet 3  . Inositol Niacinate (NIACIN FLUSH FREE PO) Take 1 tablet by mouth daily.     . isosorbide mononitrate (IMDUR) 30 MG 24 hr tablet Take 1 tablet (30 mg total) by mouth daily. 30 tablet 6  . lisinopril (PRINIVIL,ZESTRIL) 20 MG tablet Take 1 tablet (20 mg total) by mouth 2 (two) times daily. 60 tablet 6  . metoprolol (LOPRESSOR) 50 MG tablet Take 1 1/2 tablets twice a day 270 tablet 3  . nitroGLYCERIN (NITROSTAT) 0.4 MG SL tablet Place 1 tablet (0.4 mg total) under the tongue every 5 (five) minutes as needed for chest pain. 25 tablet 2  . triamterene-hydrochlorothiazide (MAXZIDE-25) 37.5-25 MG per tablet TAKE ONE TABLET BY MOUTH ONCE DAILY 90 tablet 3  . HYDROcodone-acetaminophen (NORCO/VICODIN) 5-325 MG per tablet Take 1 tablet by mouth every 6 (six) hours as needed for moderate pain or severe pain. 10 tablet 0   No facility-administered medications prior to visit.    Social History   Social History  . Marital Status: Divorced    Spouse Name: N/A  . Number of Children: N/A  . Years of Education: N/A   Social History Main Topics  . Smoking status: Current Every Day Smoker -- 0.25 packs/day for 30 years    Types: Cigarettes  . Smokeless tobacco: None  . Alcohol Use: No  . Drug Use: No  .  Sexual Activity: No   Other Topics Concern  . None   Social History Narrative     Review of Systems  Constitutional: Positive for fatigue. Negative for fever, chills and appetite change.  HENT: Negative for congestion, ear pain, postnasal drip, sinus pressure and sore throat.   Eyes:  Negative for pain and redness.  Respiratory: Positive for chest tightness and shortness of breath. Negative for cough and wheezing.   Cardiovascular: Positive for chest pain and palpitations. Negative for leg swelling.  Gastrointestinal: Negative for nausea, vomiting, abdominal pain, diarrhea, constipation and blood in stool.  Endocrine: Negative for polyuria.  Genitourinary: Negative for dysuria, urgency, frequency and flank pain.  Musculoskeletal: Negative for gait problem.  Skin: Negative for rash.  Neurological: Positive for dizziness. Negative for weakness and headaches.  Psychiatric/Behavioral: Negative for confusion and decreased concentration. The patient is not nervous/anxious.     Objective:  BP 150/84 mmHg  Pulse 70  Temp(Src) 98.1 F (36.7 C) (Oral)  Resp 18  Ht 5\' 3"  (1.6 m)  Wt 228 lb 3.2 oz (103.511 kg)  BMI 40.43 kg/m2  SpO2 96%  Physical Exam  Constitutional: She is oriented to person, place, and time. She appears well-developed and well-nourished. No distress.  HENT:  Head: Normocephalic and atraumatic.  Right Ear: External ear normal.  Left Ear: External ear normal.  Nose: Nose normal.  Eyes: Conjunctivae and EOM are normal. Pupils are equal, round, and reactive to light. No scleral icterus.  Neck: Normal range of motion. Neck supple. No tracheal deviation present.  Cardiovascular: Normal rate, regular rhythm and normal heart sounds.   Pulmonary/Chest: Effort normal. No respiratory distress. She has no wheezes. She has no rales.  Abdominal: She exhibits no mass. There is no tenderness. There is no rebound and no guarding.  Musculoskeletal: She exhibits no edema.  Lymphadenopathy:    She has no cervical adenopathy.  Neurological: She is alert and oriented to person, place, and time. Coordination normal.  Skin: Skin is warm and dry. No rash noted.  Psychiatric: She has a normal mood and affect. Her behavior is normal.      Assessment & Plan:   Shavannah  was seen today for back pain, chest pain and nausea.  Diagnoses and all orders for this visit:  Unstable angina ([place]) -     EKG 12-Lead  Coronary artery disease involving native coronary artery of native heart with unstable angina pectoris (HCC) -     aspirin chewable tablet 324 mg; Chew 4 tablets (324 mg total) by mouth once.  Essential hypertension -     aspirin chewable tablet 324 mg; Chew 4 tablets (324 mg total) by mouth once.  Tobacco abuse  Dyslipidemia- low HDL   I have discontinued Ms. Bethard's HYDROcodone-acetaminophen. I am also having her maintain her aspirin, nitroGLYCERIN, Inositol Niacinate (NIACIN FLUSH FREE PO), metoprolol, hydrALAZINE, triamterene-hydrochlorothiazide, amLODipine, isosorbide mononitrate, and lisinopril. We administered aspirin.  Meds ordered this encounter  Medications  . aspirin chewable tablet 324 mg    Sig:    TO ER via EMS   Appropriate red flag conditions were discussed with the patient as well as actions that should be taken.  Patient expressed his understanding.  Follow-up: Return if symptoms worsen or fail to improve.  Roselee Culver, MD

## 2014-12-19 DIAGNOSIS — Z8249 Family history of ischemic heart disease and other diseases of the circulatory system: Secondary | ICD-10-CM | POA: Diagnosis not present

## 2014-12-19 DIAGNOSIS — R0789 Other chest pain: Secondary | ICD-10-CM | POA: Diagnosis not present

## 2014-12-19 DIAGNOSIS — J069 Acute upper respiratory infection, unspecified: Secondary | ICD-10-CM | POA: Diagnosis not present

## 2014-12-19 DIAGNOSIS — E669 Obesity, unspecified: Secondary | ICD-10-CM | POA: Diagnosis not present

## 2014-12-19 LAB — CBC
HCT: 41.2 % (ref 36.0–46.0)
HEMOGLOBIN: 12.9 g/dL (ref 12.0–15.0)
MCH: 27.3 pg (ref 26.0–34.0)
MCHC: 31.3 g/dL (ref 30.0–36.0)
MCV: 87.1 fL (ref 78.0–100.0)
PLATELETS: 248 10*3/uL (ref 150–400)
RBC: 4.73 MIL/uL (ref 3.87–5.11)
RDW: 14.1 % (ref 11.5–15.5)
WBC: 8.5 10*3/uL (ref 4.0–10.5)

## 2014-12-19 LAB — GLUCOSE, CAPILLARY
GLUCOSE-CAPILLARY: 167 mg/dL — AB (ref 65–99)
Glucose-Capillary: 170 mg/dL — ABNORMAL HIGH (ref 65–99)

## 2014-12-19 LAB — HEMOGLOBIN A1C
Hgb A1c MFr Bld: 6.5 % — ABNORMAL HIGH (ref 4.8–5.6)
Mean Plasma Glucose: 140 mg/dL

## 2014-12-19 LAB — COMPREHENSIVE METABOLIC PANEL
ALBUMIN: 3.4 g/dL — AB (ref 3.5–5.0)
ALT: 70 U/L — ABNORMAL HIGH (ref 14–54)
ANION GAP: 9 (ref 5–15)
AST: 59 U/L — ABNORMAL HIGH (ref 15–41)
Alkaline Phosphatase: 40 U/L (ref 38–126)
BUN: 18 mg/dL (ref 6–20)
CHLORIDE: 99 mmol/L — AB (ref 101–111)
CO2: 26 mmol/L (ref 22–32)
Calcium: 8.9 mg/dL (ref 8.9–10.3)
Creatinine, Ser: 1.46 mg/dL — ABNORMAL HIGH (ref 0.44–1.00)
GFR calc non Af Amer: 40 mL/min — ABNORMAL LOW (ref >60)
GFR, EST AFRICAN AMERICAN: 47 mL/min — AB (ref >60)
GLUCOSE: 185 mg/dL — AB (ref 65–99)
Potassium: 4.4 mmol/L (ref 3.5–5.1)
SODIUM: 134 mmol/L — AB (ref 135–145)
Total Bilirubin: 0.7 mg/dL (ref 0.3–1.2)
Total Protein: 7 g/dL (ref 6.5–8.1)

## 2014-12-19 LAB — TROPONIN I: Troponin I: <0.03 ng/mL (ref <0.031)

## 2014-12-19 MED ORDER — AMLODIPINE BESYLATE 10 MG PO TABS: 10.0000 mg | ORAL_TABLET | Freq: Every day | ORAL | Status: DC

## 2014-12-19 MED ORDER — PREDNISONE 10 MG PO TABS: ORAL_TABLET | ORAL | Status: DC

## 2014-12-19 MED ORDER — NICOTINE 14 MG/24HR TD PT24: 14.0000 mg | MEDICATED_PATCH | Freq: Every day | TRANSDERMAL | Status: DC

## 2014-12-19 MED ORDER — INSULIN ASPART 100 UNIT/ML ~~LOC~~ SOLN
0.0000 [IU] | Freq: Three times a day (TID) | SUBCUTANEOUS | Status: DC
  Administered 2014-12-19: 2 [IU] via SUBCUTANEOUS

## 2014-12-19 MED ORDER — GUAIFENESIN-DM 100-10 MG/5ML PO SYRP: 5.0000 mL | ORAL_SOLUTION | ORAL | Status: DC | PRN

## 2014-12-19 MED ORDER — ALBUTEROL SULFATE HFA 108 (90 BASE) MCG/ACT IN AERS: 2.0000 | INHALATION_SPRAY | Freq: Four times a day (QID) | RESPIRATORY_TRACT | Status: DC | PRN

## 2014-12-19 MED ORDER — GLIPIZIDE 5 MG PO TABS: 5.0000 mg | ORAL_TABLET | Freq: Every day | ORAL | Status: DC

## 2014-12-19 MED ORDER — ISOSORBIDE MONONITRATE ER 30 MG PO TB24: 30.0000 mg | ORAL_TABLET | Freq: Every day | ORAL | Status: DC

## 2014-12-19 NOTE — Progress Notes (Signed)
Pt discharged to home via W/C, discharge teaching complete, nutrition consult complete, outpatient diabetes consult set up, pt verbalized understanding on instruction.  Edward Qualia RN

## 2014-12-19 NOTE — Progress Notes (Signed)
  RD consulted for nutrition education regarding new onset diabetes.   Lab Results  Component Value Date   HGBA1C 6.5* 12/18/2014    RD provided "Carbohydrate Counting for People with Diabetes" handout from the Academy of [employer] and Dietetics. Discussed different food groups and their effects on blood sugar, emphasizing carbohydrate-containing foods. Provided list of carbohydrates and recommended serving sizes of common foods.  Discussed importance of controlled and consistent carbohydrate intake throughout the day. Provided examples of ways to balance meals/snacks and encouraged intake of high-fiber, whole grain complex carbohydrates. Teach back method used.  Expect good compliance. Patient was very receptive and wishes to make an outpatient appointment [facility] for further education. She is interested in weight loss as well as controlling her blood sugar.  Body mass index is 38.83 kg/(m^2). Pt meets criteria for class 2 obesity based on current BMI.  Labs and medications reviewed. No further nutrition interventions warranted at this time. Patient is being discharged home today. RD contact information provided. If additional nutrition issues arise, please re-consult RD.  Molli Barrows, RD, LDN, [place] Pager [phone] After Hours Pager [phone]

## 2014-12-19 NOTE — Discharge Instructions (Signed)
Chest Wall Pain °Chest wall pain is pain in or around the bones and muscles of your chest. Sometimes, an injury causes this pain. Sometimes, the cause may not be known. This pain may take several weeks or longer to get better. °HOME CARE °Pay attention to any changes in your symptoms. Take these actions to help with your pain: °· Rest as told by your doctor. °· Avoid activities that cause pain. Try not to use your chest, belly (abdominal), or side muscles to lift heavy things. °· If directed, apply ice to the painful area: °¨ Put ice in a plastic bag. °¨ Place a towel between your skin and the bag. °¨ Leave the ice on for 20 minutes, 2-3 times per day. °· Take over-the-counter and prescription medicines only as told by your doctor. °· Do not use tobacco products, including cigarettes, chewing tobacco, and e-cigarettes. If you need help quitting, ask your doctor. °· Keep all follow-up visits as told by your doctor. This is important. °GET HELP IF: °· You have a fever. °· Your chest pain gets worse. °· You have new symptoms. °GET HELP RIGHT AWAY IF: °· You feel sick to your stomach (nauseous) or you throw up (vomit). °· You feel sweaty or light-headed. °· You have a cough with phlegm (sputum) or you cough up blood. °· You are short of breath. °  °This information is not intended to replace advice given to you by your health care provider. Make sure you discuss any questions you have with your health care provider. °  °Document Released: 06/16/2007 Document Revised: 09/18/2014 Document Reviewed: 03/25/2014 °Elsevier Interactive Patient Education ©2016 [employer] ° °

## 2014-12-19 NOTE — Discharge Summary (Signed)
Physician Discharge Summary  [name] MRN: [id number] DOB/AGE: 52-04-1960 52 y.o.  PCP: No PCP Per Patient   Admit date: 12/18/2014 Discharge date: 12/19/2014  Discharge Diagnoses:     Principal Problem:   Viral URI with cough Active Problems:   CAD (coronary artery disease), non obstructive CAD in 2010, no change with cath 09/01/12   HTN (hypertension)   Dyslipidemia- low HDL   Tobacco abuse   Family history of premature CAD   Pleuritic chest pain   Obesity (BMI 30-39.9)   Hematoma of groin - right, s/p cath; stable on discharge    Follow-up recommendations Follow-up with PCP in 3-5 days , including all  additional recommended appointments as below Follow-up CBC, CMP in 3-5 days recommend renal ultrasound to be arranged for by PCP as an outpatient     Medication List    STOP taking these medications        NIACIN FLUSH FREE PO      TAKE these medications        albuterol 108 (90 BASE) MCG/ACT inhaler  Commonly known as:  PROVENTIL HFA;VENTOLIN HFA  Inhale 2 puffs into the lungs every 6 (six) hours as needed for wheezing or shortness of breath.     amLODipine 10 MG tablet  Commonly known as:  NORVASC  TAKE ONE TABLET BY MOUTH ONCE DAILY     aspirin 81 MG tablet  Take 81 mg by mouth at bedtime.     glipiZIDE 5 MG tablet  Commonly known as:  GLUCOTROL  Take 1 tablet (5 mg total) by mouth daily before breakfast.     guaiFENesin-dextromethorphan 100-10 MG/5ML syrup  Commonly known as:  ROBITUSSIN DM  Take 5 mLs by mouth every 4 (four) hours as needed for cough.     hydrALAZINE 50 MG tablet  Commonly known as:  APRESOLINE  Take 1 tablet (50 mg total) by mouth 2 (two) times daily.     isosorbide mononitrate 30 MG 24 hr tablet  Commonly known as:  IMDUR  Take 1 tablet (30 mg total) by mouth daily.     lisinopril 20 MG tablet  Commonly known as:  PRINIVIL,ZESTRIL  Take 1 tablet (20 mg total) by mouth 2 (two) times daily.     metoprolol 50 MG  tablet  Commonly known as:  LOPRESSOR  Take 1 1/2 tablets twice a day     nicotine 14 mg/24hr patch  Commonly known as:  NICODERM CQ - dosed in mg/24 hours  Place 1 patch (14 mg total) onto the skin daily.     nitroGLYCERIN 0.4 MG SL tablet  Commonly known as:  NITROSTAT  Place 1 tablet (0.4 mg total) under the tongue every 5 (five) minutes as needed for chest pain.     predniSONE 10 MG tablet  Commonly known as:  DELTASONE  4 tablets 3 days, 3 tablets 3 days, 2 tablets 3 days, 1 tablet 3 days then DC     triamterene-hydrochlorothiazide 37.5-25 MG tablet  Commonly known as:  MAXZIDE-25  TAKE ONE TABLET BY MOUTH ONCE DAILY         Discharge Condition: *   Discharge Instructions       Discharge Instructions    Diet - low sodium heart healthy    Complete by:  As directed      Increase activity slowly    Complete by:  As directed            Allergies  Allergen Reactions  .  Bupropion     Other reaction(s): Other Suicidal thoughts and increased depression  . Rosuvastatin Other (See Comments)    myalgia  . Simvastatin     myalgias  . Wellbutrin [Bupropion Hcl]     unknown  . Zetia [Ezetimibe]     myalgias      Disposition: 01-Home or Self Care   Consults:  cardiology    Significant Diagnostic Studies:  Dg Chest 2 View  12/18/2014  CLINICAL DATA:  Bilateral upper chest pain, shortness of breath for 2 days EXAM: CHEST  2 VIEW COMPARISON:  08/30/2012 FINDINGS: Cardiomediastinal silhouette is stable. Calcified granuloma in right upper lobe is stable. No acute infiltrate or pleural effusion. No pulmonary edema. Bony thorax is unremarkable. IMPRESSION: No active cardiopulmonary disease. Electronically Signed   By: Lahoma Crocker M.D.   On: 12/18/2014 12:17   Ct Angio Chest Pe W/cm &/or Wo Cm  12/18/2014  CLINICAL DATA:  Dry cough 3 days.  Chest tightness and pressure. EXAM: CT ANGIOGRAPHY CHEST WITH CONTRAST TECHNIQUE: Multidetector CT imaging of the chest  was performed using the standard protocol during bolus administration of intravenous contrast. Multiplanar CT image reconstructions and MIPs were obtained to evaluate the vascular anatomy. CONTRAST:  162m OMNIPAQUE IOHEXOL 350 MG/ML SOLN COMPARISON:  Chest radiograph earlier today.  CT chest 01/25/2008 FINDINGS: Mediastinum: No filling defects are noted within the pulmonary arterial tree to suggest pulmonary emboli. Heart size is normal. No pericardial fluid, thickening or calcification. No acute abnormality of the thoracic aorta or other great vessels of the mediastinum. Mild atheromatous change transverse arch. LEFT main, LEFT anterior descending, and LEFT circumflex coronary artery calcification. Minor RIGHT main coronary artery calcification. No pathologically enlarged mediastinal or hilar lymph nodes. The esophagus is normal in appearance. Lungs/Pleura: No consolidative airspace disease. No pleural effusions. No suspicious appearing pulmonary nodules or masses. 4 mm and 5 mm RIGHT upper lobe granulomas, stable Upper Abdomen: Visualized portions of the upper abdomen are unremarkable. Extensive BILATERAL renal cystic disease, incompletely evaluated, but grossly similar to the appearance in 2010. Suspected nephrolithiasis on the RIGHT. Sliding-type hiatal hernia. Musculoskeletal: No aggressive appearing lytic or blastic lesions are noted in the visualized portions of the skeleton. Minor thoracic spondylosis. Review of the MIP images confirms the above findings. IMPRESSION: No evidence for pulmonary emboli or consolidative airspace disease. Three vessel coronary artery calcification. Extensive BILATERAL renal cystic disease, incompletely evaluated, but probably stable. Suspected RIGHT renal nephrolithiasis nonobstructive. Electronically Signed   By: JStaci RighterM.D.   On: 12/18/2014 15:49        Filed Weights   12/18/14 2038 12/19/14 0441  Weight: 103.465 kg (228 lb 1.6 oz) 102.649 kg (226 lb 4.8 oz)      Microbiology: No results found for this or any previous visit (from the past 240 hour(s)).     Blood Culture No results found for: SDES, [place] CULT, REPTSTATUS    Labs: Results for orders placed or performed during the hospital encounter of 12/18/14 (from the past 48 hour(s))  Basic metabolic panel     Status: Abnormal   Collection Time: 12/18/14 11:49 AM  Result Value Ref Range   Sodium 138 135 - 145 mmol/L   Potassium 4.0 3.5 - 5.1 mmol/L   Chloride 103 101 - 111 mmol/L   CO2 26 22 - 32 mmol/L   Glucose, Bld 109 (H) 65 - 99 mg/dL   BUN 17 6 - 20 mg/dL   Creatinine, Ser 1.33 (H) 0.44 - 1.00 mg/dL  Calcium 8.8 (L) 8.9 - 10.3 mg/dL   GFR calc non Af Amer 45 (L) >60 mL/min   GFR calc Af Amer 52 (L) >60 mL/min    Comment: (NOTE) The eGFR has been calculated using the CKD EPI equation. This calculation has not been validated in all clinical situations. eGFR's persistently <60 mL/min signify possible Chronic Kidney Disease.    Anion gap 9 5 - 15  CBC     Status: None   Collection Time: 12/18/14 11:49 AM  Result Value Ref Range   WBC 7.5 4.0 - 10.5 K/uL   RBC 4.33 3.87 - 5.11 MIL/uL   Hemoglobin 12.0 12.0 - 15.0 g/dL   HCT 37.3 36.0 - 46.0 %   MCV 86.1 78.0 - 100.0 fL   MCH 27.7 26.0 - 34.0 pg   MCHC 32.2 30.0 - 36.0 g/dL   RDW 14.3 11.5 - 15.5 %   Platelets 220 150 - 400 K/uL  D-dimer, quantitative (not at [facility])     Status: Abnormal   Collection Time: 12/18/14 11:49 AM  Result Value Ref Range   D-Dimer, Quant 0.61 (H) 0.00 - 0.50 ug/mL-FEU    Comment: (NOTE) At the manufacturer cut-off of 0.50 ug/mL FEU, this assay has been documented to exclude PE with a sensitivity and negative predictive value of 97 to 99%.  At this time, this assay has not been approved by the FDA to exclude DVT/VTE. Results should be correlated with clinical presentation.   I-Stat Beta hCG blood, ED ([facility], [facility], [facility] only)     Status: None   Collection Time: 12/18/14 11:54 AM  Result  Value Ref Range   I-stat hCG, quantitative <5.0 <5 mIU/mL   Comment 3            Comment:   GEST. AGE      CONC.  (mIU/mL)   <=1 WEEK        5 - 50     2 WEEKS       50 - 500     3 WEEKS       100 - 10,000     4 WEEKS     1,000 - 30,000        FEMALE AND NON-PREGNANT FEMALE:     LESS THAN 5 mIU/mL   I-stat troponin, ED (not at [facility], [facility])     Status: None   Collection Time: 12/18/14 11:55 AM  Result Value Ref Range   Troponin i, poc 0.01 0.00 - 0.08 ng/mL   Comment 3            Comment: Due to the release kinetics of cTnI, a negative result within the first hours of the onset of symptoms does not rule out myocardial infarction with certainty. If myocardial infarction is still suspected, repeat the test at appropriate intervals.   I-Stat Troponin, ED (not at [facility])     Status: None   Collection Time: 12/18/14  3:25 PM  Result Value Ref Range   Troponin i, poc 0.00 0.00 - 0.08 ng/mL   Comment 3            Comment: Due to the release kinetics of cTnI, a negative result within the first hours of the onset of symptoms does not rule out myocardial infarction with certainty. If myocardial infarction is still suspected, repeat the test at appropriate intervals.   Troponin I-serum (0, 3, 6 hours)     Status: None   Collection Time: 12/18/14  8:01 PM  Result Value Ref Range   Troponin I <0.03 <0.031 ng/mL    Comment:        NO INDICATION OF MYOCARDIAL INJURY.   Creatinine, serum     Status: Abnormal   Collection Time: 12/18/14  8:01 PM  Result Value Ref Range   Creatinine, Ser 1.35 (H) 0.44 - 1.00 mg/dL   GFR calc non Af Amer 44 (L) >60 mL/min   GFR calc Af Amer 51 (L) >60 mL/min    Comment: (NOTE) The eGFR has been calculated using the CKD EPI equation. This calculation has not been validated in all clinical situations. eGFR's persistently <60 mL/min signify possible Chronic Kidney Disease.   Hepatic function panel     Status: Abnormal   Collection Time: 12/18/14  8:01 PM   Result Value Ref Range   Total Protein 7.4 6.5 - 8.1 g/dL   Albumin 3.6 3.5 - 5.0 g/dL   AST 68 (H) 15 - 41 U/L   ALT 75 (H) 14 - 54 U/L   Alkaline Phosphatase 40 38 - 126 U/L   Total Bilirubin 0.6 0.3 - 1.2 mg/dL   Bilirubin, Direct 0.1 0.1 - 0.5 mg/dL   Indirect Bilirubin 0.5 0.3 - 0.9 mg/dL  Hemoglobin A1c     Status: Abnormal   Collection Time: 12/18/14  8:01 PM  Result Value Ref Range   Hgb A1c MFr Bld 6.5 (H) 4.8 - 5.6 %    Comment: (NOTE)         Pre-diabetes: 5.7 - 6.4         Diabetes: >6.4         Glycemic control for adults with diabetes: <7.0    Mean Plasma Glucose 140 mg/dL    Comment: (NOTE) Performed At: [facility] [place], [place] [id number] Lindon Romp MD [id number]   Lipid panel     Status: Abnormal   Collection Time: 12/18/14  8:16 PM  Result Value Ref Range   Cholesterol 197 0 - 200 mg/dL   Triglycerides 342 (H) <150 mg/dL   HDL 27 (L) >40 mg/dL   Total CHOL/HDL Ratio 7.3 RATIO   VLDL 68 (H) 0 - 40 mg/dL   LDL Cholesterol 102 (H) 0 - 99 mg/dL    Comment:        Total Cholesterol/HDL:CHD Risk Coronary Heart Disease Risk Table                     Men   Women  1/2 Average Risk   3.4   3.3  Average Risk       5.0   4.4  2 X Average Risk   9.6   7.1  3 X Average Risk  23.4   11.0        Use the calculated Patient Ratio above and the CHD Risk Table to determine the patient's CHD Risk.        ATP III CLASSIFICATION (LDL):  <100     mg/dL   Optimal  100-129  mg/dL   Near or Above                    Optimal  130-159  mg/dL   Borderline  160-189  mg/dL   High  >190     mg/dL   Very High   CBC     Status: None   Collection Time: 12/19/14  3:10 AM  Result Value Ref Range   WBC 8.5 4.0 -  10.5 K/uL   RBC 4.73 3.87 - 5.11 MIL/uL   Hemoglobin 12.9 12.0 - 15.0 g/dL   HCT 41.2 36.0 - 46.0 %   MCV 87.1 78.0 - 100.0 fL   MCH 27.3 26.0 - 34.0 pg   MCHC 31.3 30.0 - 36.0 g/dL   RDW 14.1 11.5 - 15.5 %   Platelets 248 150  - 400 K/uL  Comprehensive metabolic panel     Status: Abnormal   Collection Time: 12/19/14  3:10 AM  Result Value Ref Range   Sodium 134 (L) 135 - 145 mmol/L   Potassium 4.4 3.5 - 5.1 mmol/L   Chloride 99 (L) 101 - 111 mmol/L   CO2 26 22 - 32 mmol/L   Glucose, Bld 185 (H) 65 - 99 mg/dL   BUN 18 6 - 20 mg/dL   Creatinine, Ser 1.46 (H) 0.44 - 1.00 mg/dL   Calcium 8.9 8.9 - 10.3 mg/dL   Total Protein 7.0 6.5 - 8.1 g/dL   Albumin 3.4 (L) 3.5 - 5.0 g/dL   AST 59 (H) 15 - 41 U/L   ALT 70 (H) 14 - 54 U/L   Alkaline Phosphatase 40 38 - 126 U/L   Total Bilirubin 0.7 0.3 - 1.2 mg/dL   GFR calc non Af Amer 40 (L) >60 mL/min   GFR calc Af Amer 47 (L) >60 mL/min    Comment: (NOTE) The eGFR has been calculated using the CKD EPI equation. This calculation has not been validated in all clinical situations. eGFR's persistently <60 mL/min signify possible Chronic Kidney Disease.    Anion gap 9 5 - 15  Troponin I     Status: None   Collection Time: 12/19/14  3:10 AM  Result Value Ref Range   Troponin I <0.03 <0.031 ng/mL    Comment:        NO INDICATION OF MYOCARDIAL INJURY.   Glucose, capillary     Status: Abnormal   Collection Time: 12/19/14 10:02 AM  Result Value Ref Range   Glucose-Capillary 170 (H) 65 - 99 mg/dL     Lipid Panel     Component Value Date/Time   CHOL 197 12/18/2014 2016   TRIG 342* 12/18/2014 2016   HDL 27* 12/18/2014 2016   CHOLHDL 7.3 12/18/2014 2016   VLDL 68* 12/18/2014 2016   LDLCALC 102* 12/18/2014 2016     Lab Results  Component Value Date   HGBA1C 6.5* 12/18/2014   HGBA1C 6.1* 04/15/2011   HGBA1C  02/06/2008    5.7 (NOTE)   The ADA recommends the following therapeutic goal for glycemic   control related to Hgb A1C measurement:   Goal of Therapy:   < 7.0% Hgb A1C   Reference: American Diabetes Association: Clinical Practice   Recommendations 2008, Diabetes Care,  2008, 31:(Suppl 1).     Lab Results  Component Value Date   LDLCALC 102*  12/18/2014   CREATININE 1.46* 12/19/2014     HPI :54 y.o. female with a past medical history significant for coronary artery disease. In 2013 she underwent left heart catheterization which demonstrated a 60% LAD stenosis and 50% mid RCA stenosis as well as a 70% distal RCA stenosis with normal LV function. A repeat cath in 2014 revealed no change in her anatomy. Since then she's been on medical therapy. She presents today to the ER with chest pain. She says for the past several day she's had a dry cough, nasal congestion and a mild scratchy throat as well as headache.  Today she's developed some chest tightness and pressure. She says it's somewhat constant and not worse with change in position. There is somewhat of a pleuritic nature to the pain as well. EKG demonstrates new T-wave inversions and lateral ST depressions, similar to findings in the past. Initial troponin is negative. Chest x-ray shows no acute pulmonary process. Cardiology is asked to evaluate for possible unstable angina.   HOSPITAL COURSE:   Chest pain-atypical, cardiology consulted, patient seen by Dr. Debara Pickett, he does not recommend any further cardiac workup, no stress test or echocardiogram needed ., patient has moderate coronary artery disease, symptoms likely secondary to pleurisy as well as acute bronchitis, continue to cycle cardiac enzymes are negative no further cardiac workup indicated at this time.CT chest did not show any evidence of bony embolism or pneumonia.  Bilateral renal cystic disease-recommend renal ultrasound to be arranged for by PCP as an outpatient Case management consulted to ensure that the patient has a PCP appointment  Acute bronchitis, start patient on Xopenex nebulizer treatments, prednisone taper, no antibiotics indicated at this time, nicotine cessation counseling done, ruled out for pulmonary embolism   CAD (coronary artery disease), non obstructive CAD in 2010, no change with cath 09/01/12-management  as above, No further workup indicated at this time per cardiology   HTN (hypertension)-uncontrolled, restart home medications and monitor  Hypertriglyceridemia-LDL 102, triglycerides 342, recommend statin therapy, patient would like to discuss this with her PCP    Tobacco abuse-smoking cessation counseling done, start nicotine patch  New-onset diabetes-hemoglobin A1c 6.5, patient started on glipizide   Obesity (BMI 30-39.9)- new-onset diabetes, weight loss recommended   Discharge Exam:    Blood pressure 138/62, pulse 69, temperature 97.8 F (36.6 C), temperature source Oral, resp. rate 16, height _0  (1.626 m), weight 102.649 kg (226 lb 4.8 oz), SpO2 97 %.   Cardiovascular: RRR, S1 normal, S2 normal, no MRG, pulses symmetric and intact bilaterally  Pulmonary/Chest: Mild bibasilar wheezing, rales, or rhonchi  Abdominal: Soft. Non-tender, non-distended, bowel sounds are normal, no masses, organomegaly, or guarding present.  GU: no CVA tenderness Musculoskeletal: No joint deformities, erythema, or stiffness, ROM full and no nontender Ext: no edema and no cyanosis, pulses palpable bilaterally (DP and PT)  Hematology: no cervical, inginal, or axillary adenopathy.  Neurological: A&O x3, Strenght is normal and symmetric bilaterally, cranial nerve II-XII are grossly intact, no focal motor deficit, sensory intact to light touch bilaterally.    Follow-up Information    Follow up with pcp. Schedule an appointment as soon as possible for a visit in 3 days.      SignedReyne Dumas 12/19/2014, 11:37 AM        Time spent >45 mins

## 2014-12-19 NOTE — Progress Notes (Signed)
DAILY PROGRESS NOTE  Subjective:  No events overnight. Chest pain has resolved. Still with cough, URI symptoms. Troponin negative x 4.  Objective:  Temp:  [97.8 F (36.6 C)-98.1 F (36.7 C)] 97.8 F (36.6 C) (12/08 0441) Pulse Rate:  [63-79] 69 (12/08 0441) Resp:  [12-31] 16 (12/08 0100) BP: (138-208)/(60-94) 138/62 mmHg (12/08 0441) SpO2:  [84 %-98 %] 97 % (12/08 0441) Weight:  [226 lb 4.8 oz (102.649 kg)-228 lb 1.6 oz (103.465 kg)] 226 lb 4.8 oz (102.649 kg) (12/08 0441) Weight change:   Intake/Output from previous day:    Intake/Output from this shift:    Medications: Current Facility-Administered Medications  Medication Dose Route Frequency Provider Last Rate Last Dose  . 0.9 %  sodium chloride infusion   Intravenous Continuous Reyne Dumas, MD      . acetaminophen (TYLENOL) tablet 650 mg  650 mg Oral Q4H PRN Reyne Dumas, MD      . amLODipine (NORVASC) tablet 10 mg  10 mg Oral QHS Reyne Dumas, MD      . aspirin EC tablet 81 mg  81 mg Oral QHS Reyne Dumas, MD   0 mg at 12/18/14 2200  . gi cocktail (Maalox,Lidocaine,Donnatal)  30 mL Oral QID PRN Reyne Dumas, MD      . guaiFENesin-dextromethorphan (ROBITUSSIN DM) 100-10 MG/5ML syrup 5 mL  5 mL Oral Q4H PRN Reyne Dumas, MD   5 mL at 12/18/14 1856  . heparin injection 5,000 Units  5,000 Units Subcutaneous 3 times per day Reyne Dumas, MD   5,000 Units at 12/19/14 0622  . hydrALAZINE (APRESOLINE) tablet 50 mg  50 mg Oral BID Reyne Dumas, MD   50 mg at 12/19/14 0804  . insulin aspart (novoLOG) injection 0-9 Units  0-9 Units Subcutaneous TID WC Reyne Dumas, MD      . isosorbide mononitrate (IMDUR) 24 hr tablet 30 mg  30 mg Oral QHS Reyne Dumas, MD      . levalbuterol (XOPENEX) nebulizer solution 1.25 mg  1.25 mg Nebulization Q6H PRN Reyne Dumas, MD      . lisinopril (PRINIVIL,ZESTRIL) tablet 20 mg  20 mg Oral BID Reyne Dumas, MD   20 mg at 12/19/14 0803  . metoprolol tartrate (LOPRESSOR) tablet 50 mg  50 mg Oral BID  Reyne Dumas, MD   50 mg at 12/19/14 0803  . nicotine (NICODERM CQ - dosed in mg/24 hours) patch 14 mg  14 mg Transdermal Daily Reyne Dumas, MD   14 mg at 12/19/14 0805  . ondansetron (ZOFRAN) injection 4 mg  4 mg Intravenous Q6H PRN Reyne Dumas, MD      . predniSONE (DELTASONE) tablet 40 mg  40 mg Oral BID Reyne Dumas, MD   40 mg at 12/19/14 0804    Physical Exam: General appearance: alert and no distress Lungs: clear to auscultation bilaterally Heart: regular rate and rhythm, S1, S2 normal, no murmur, click, rub or gallop Extremities: extremities normal, atraumatic, no cyanosis or edema  Lab Results: Results for orders placed or performed during the hospital encounter of 12/18/14 (from the past 48 hour(s))  Basic metabolic panel     Status: Abnormal   Collection Time: 12/18/14 11:49 AM  Result Value Ref Range   Sodium 138 135 - 145 mmol/L   Potassium 4.0 3.5 - 5.1 mmol/L   Chloride 103 101 - 111 mmol/L   CO2 26 22 - 32 mmol/L   Glucose, Bld 109 (H) 65 - 99 mg/dL   BUN 17 6 -  20 mg/dL   Creatinine, Ser 1.33 (H) 0.44 - 1.00 mg/dL   Calcium 8.8 (L) 8.9 - 10.3 mg/dL   GFR calc non Af Amer 45 (L) >60 mL/min   GFR calc Af Amer 52 (L) >60 mL/min    Comment: (NOTE) The eGFR has been calculated using the CKD EPI equation. This calculation has not been validated in all clinical situations. eGFR's persistently <60 mL/min signify possible Chronic Kidney Disease.    Anion gap 9 5 - 15  CBC     Status: None   Collection Time: 12/18/14 11:49 AM  Result Value Ref Range   WBC 7.5 4.0 - 10.5 K/uL   RBC 4.33 3.87 - 5.11 MIL/uL   Hemoglobin 12.0 12.0 - 15.0 g/dL   HCT 37.3 36.0 - 46.0 %   MCV 86.1 78.0 - 100.0 fL   MCH 27.7 26.0 - 34.0 pg   MCHC 32.2 30.0 - 36.0 g/dL   RDW 14.3 11.5 - 15.5 %   Platelets 220 150 - 400 K/uL  D-dimer, quantitative (not at [facility])     Status: Abnormal   Collection Time: 12/18/14 11:49 AM  Result Value Ref Range   D-Dimer, Quant 0.61 (H) 0.00 - 0.50  ug/mL-FEU    Comment: (NOTE) At the manufacturer cut-off of 0.50 ug/mL FEU, this assay has been documented to exclude PE with a sensitivity and negative predictive value of 97 to 99%.  At this time, this assay has not been approved by the FDA to exclude DVT/VTE. Results should be correlated with clinical presentation.   I-Stat Beta hCG blood, ED ([facility], [facility], [facility] only)     Status: None   Collection Time: 12/18/14 11:54 AM  Result Value Ref Range   I-stat hCG, quantitative <5.0 <5 mIU/mL   Comment 3            Comment:   GEST. AGE      CONC.  (mIU/mL)   <=1 WEEK        5 - 50     2 WEEKS       50 - 500     3 WEEKS       100 - 10,000     4 WEEKS     1,000 - 30,000        FEMALE AND NON-PREGNANT FEMALE:     LESS THAN 5 mIU/mL   I-stat troponin, ED (not at [facility], [facility])     Status: None   Collection Time: 12/18/14 11:55 AM  Result Value Ref Range   Troponin i, poc 0.01 0.00 - 0.08 ng/mL   Comment 3            Comment: Due to the release kinetics of cTnI, a negative result within the first hours of the onset of symptoms does not rule out myocardial infarction with certainty. If myocardial infarction is still suspected, repeat the test at appropriate intervals.   I-Stat Troponin, ED (not at [facility])     Status: None   Collection Time: 12/18/14  3:25 PM  Result Value Ref Range   Troponin i, poc 0.00 0.00 - 0.08 ng/mL   Comment 3            Comment: Due to the release kinetics of cTnI, a negative result within the first hours of the onset of symptoms does not rule out myocardial infarction with certainty. If myocardial infarction is still suspected, repeat the test at appropriate intervals.   Troponin I-serum (0, 3, 6 hours)  Status: None   Collection Time: 12/18/14  8:01 PM  Result Value Ref Range   Troponin I <0.03 <0.031 ng/mL    Comment:        NO INDICATION OF MYOCARDIAL INJURY.   Creatinine, serum     Status: Abnormal   Collection Time: 12/18/14  8:01 PM  Result Value Ref  Range   Creatinine, Ser 1.35 (H) 0.44 - 1.00 mg/dL   GFR calc non Af Amer 44 (L) >60 mL/min   GFR calc Af Amer 51 (L) >60 mL/min    Comment: (NOTE) The eGFR has been calculated using the CKD EPI equation. This calculation has not been validated in all clinical situations. eGFR's persistently <60 mL/min signify possible Chronic Kidney Disease.   Hepatic function panel     Status: Abnormal   Collection Time: 12/18/14  8:01 PM  Result Value Ref Range   Total Protein 7.4 6.5 - 8.1 g/dL   Albumin 3.6 3.5 - 5.0 g/dL   AST 68 (H) 15 - 41 U/L   ALT 75 (H) 14 - 54 U/L   Alkaline Phosphatase 40 38 - 126 U/L   Total Bilirubin 0.6 0.3 - 1.2 mg/dL   Bilirubin, Direct 0.1 0.1 - 0.5 mg/dL   Indirect Bilirubin 0.5 0.3 - 0.9 mg/dL  Hemoglobin A1c     Status: Abnormal   Collection Time: 12/18/14  8:01 PM  Result Value Ref Range   Hgb A1c MFr Bld 6.5 (H) 4.8 - 5.6 %    Comment: (NOTE)         Pre-diabetes: 5.7 - 6.4         Diabetes: >6.4         Glycemic control for adults with diabetes: <7.0    Mean Plasma Glucose 140 mg/dL    Comment: (NOTE) Performed At: [facility] [place], [place] [id number] Lindon Romp MD IW:[id number]   Lipid panel     Status: Abnormal   Collection Time: 12/18/14  8:16 PM  Result Value Ref Range   Cholesterol 197 0 - 200 mg/dL   Triglycerides 342 (H) <150 mg/dL   HDL 27 (L) >40 mg/dL   Total CHOL/HDL Ratio 7.3 RATIO   VLDL 68 (H) 0 - 40 mg/dL   LDL Cholesterol 102 (H) 0 - 99 mg/dL    Comment:        Total Cholesterol/HDL:CHD Risk Coronary Heart Disease Risk Table                     Men   Women  1/2 Average Risk   3.4   3.3  Average Risk       5.0   4.4  2 X Average Risk   9.6   7.1  3 X Average Risk  23.4   11.0        Use the calculated Patient Ratio above and the CHD Risk Table to determine the patient's CHD Risk.        ATP III CLASSIFICATION (LDL):  <100     mg/dL   Optimal  100-129  mg/dL   Near or Above                     Optimal  130-159  mg/dL   Borderline  160-189  mg/dL   High  >190     mg/dL   Very High   CBC     Status: None   Collection Time: 12/19/14  3:10 AM  Result Value Ref Range   WBC 8.5 4.0 - 10.5 K/uL   RBC 4.73 3.87 - 5.11 MIL/uL   Hemoglobin 12.9 12.0 - 15.0 g/dL   HCT 41.2 36.0 - 46.0 %   MCV 87.1 78.0 - 100.0 fL   MCH 27.3 26.0 - 34.0 pg   MCHC 31.3 30.0 - 36.0 g/dL   RDW 14.1 11.5 - 15.5 %   Platelets 248 150 - 400 K/uL  Comprehensive metabolic panel     Status: Abnormal   Collection Time: 12/19/14  3:10 AM  Result Value Ref Range   Sodium 134 (L) 135 - 145 mmol/L   Potassium 4.4 3.5 - 5.1 mmol/L   Chloride 99 (L) 101 - 111 mmol/L   CO2 26 22 - 32 mmol/L   Glucose, Bld 185 (H) 65 - 99 mg/dL   BUN 18 6 - 20 mg/dL   Creatinine, Ser 1.46 (H) 0.44 - 1.00 mg/dL   Calcium 8.9 8.9 - 10.3 mg/dL   Total Protein 7.0 6.5 - 8.1 g/dL   Albumin 3.4 (L) 3.5 - 5.0 g/dL   AST 59 (H) 15 - 41 U/L   ALT 70 (H) 14 - 54 U/L   Alkaline Phosphatase 40 38 - 126 U/L   Total Bilirubin 0.7 0.3 - 1.2 mg/dL   GFR calc non Af Amer 40 (L) >60 mL/min   GFR calc Af Amer 47 (L) >60 mL/min    Comment: (NOTE) The eGFR has been calculated using the CKD EPI equation. This calculation has not been validated in all clinical situations. eGFR's persistently <60 mL/min signify possible Chronic Kidney Disease.    Anion gap 9 5 - 15  Troponin I     Status: None   Collection Time: 12/19/14  3:10 AM  Result Value Ref Range   Troponin I <0.03 <0.031 ng/mL    Comment:        NO INDICATION OF MYOCARDIAL INJURY.   Glucose, capillary     Status: Abnormal   Collection Time: 12/19/14 10:02 AM  Result Value Ref Range   Glucose-Capillary 170 (H) 65 - 99 mg/dL    Imaging: Imaging results have been reviewed  Assessment:  1. Principal Problem: 2.   Chest pain 3. Active Problems: 4.   CAD (coronary artery disease), non obstructive CAD in 2010, no change with cath 09/01/12 5.   HTN (hypertension) 6.    Dyslipidemia- low HDL 7.   Tobacco abuse 8.   Family history of premature CAD 9.   Obesity (BMI 30-39.9) 10.   Hematoma of groin - right, s/p cath; stable on discharge 11.   Plan:  1. Feels better. Cough syrup helped her sleep. No further cardiac work-up is necessary. Marvin for d/c home today from my standpoint. She would like to be out of work at least through the weekend to recover. Will defer to primary service. Cardiology will sign-off.  Time Spent Directly with Patient:  15 minutes  Length of Stay:    Pixie Casino, MD, [facility] Attending Cardiologist [place] 12/19/2014, 11:21 AM

## 2014-12-20 ENCOUNTER — Other Ambulatory Visit: Payer: Self-pay | Admitting: Cardiology

## 2015-01-23 ENCOUNTER — Ambulatory Visit: Payer: Managed Care, Other (non HMO)

## 2015-01-30 ENCOUNTER — Ambulatory Visit: Payer: Managed Care, Other (non HMO)

## 2015-02-06 ENCOUNTER — Ambulatory Visit: Payer: Managed Care, Other (non HMO)

## 2015-02-08 ENCOUNTER — Other Ambulatory Visit: Payer: Self-pay | Admitting: Cardiovascular Disease

## 2015-02-13 ENCOUNTER — Encounter: Payer: Managed Care, Other (non HMO) | Attending: Cardiovascular Disease

## 2015-02-13 VITALS — Ht 64.0 in | Wt 231.6 lb

## 2015-02-13 DIAGNOSIS — E118 Type 2 diabetes mellitus with unspecified complications: Secondary | ICD-10-CM | POA: Diagnosis present

## 2015-02-13 DIAGNOSIS — E669 Obesity, unspecified: Secondary | ICD-10-CM | POA: Diagnosis not present

## 2015-02-13 DIAGNOSIS — E119 Type 2 diabetes mellitus without complications: Secondary | ICD-10-CM

## 2015-02-13 NOTE — Progress Notes (Signed)

## 2015-02-20 DIAGNOSIS — E118 Type 2 diabetes mellitus with unspecified complications: Secondary | ICD-10-CM | POA: Diagnosis not present

## 2015-02-20 DIAGNOSIS — E119 Type 2 diabetes mellitus without complications: Secondary | ICD-10-CM

## 2015-02-20 NOTE — Progress Notes (Signed)

## 2015-02-21 ENCOUNTER — Other Ambulatory Visit: Payer: Self-pay | Admitting: Cardiovascular Disease

## 2015-02-21 NOTE — Telephone Encounter (Signed)
Amlodipine refill refused - refilled 01/15/15

## 2015-02-24 NOTE — Telephone Encounter (Signed)
Rx(s) sent to pharmacy electronically.  

## 2015-02-27 DIAGNOSIS — E118 Type 2 diabetes mellitus with unspecified complications: Secondary | ICD-10-CM | POA: Diagnosis not present

## 2015-02-27 DIAGNOSIS — E119 Type 2 diabetes mellitus without complications: Secondary | ICD-10-CM

## 2015-03-04 NOTE — Progress Notes (Signed)
Patient was seen on 03/04/15 for the third of a series of three diabetes self-management courses at the [facility].   [name] the amount of activity recommended for healthy living . Describe activities suitable for individual needs . Identify ways to regularly incorporate activity into daily life . Identify barriers to activity and ways to over come these barriers  Identify diabetes medications being personally used and their primary action for lowering glucose and possible side effects . Describe role of stress on blood glucose and develop strategies to address psychosocial issues . Identify diabetes complications and ways to prevent them  Explain how to manage diabetes during illness . Evaluate success in meeting personal goal . Establish 2-3 goals that they will plan to diligently work on until they return for the  57-month follow-up visit  Goals:   I will count my carb choices at most meals and snacks  I will limit sugar and sodium intake  I will be active 30 minutes or more 5 times a week  I will take my diabetes medications as scheduled  I will eat less unhealthy fats by eating less bologna  To help manage stress I will  Do laundry at least 2 times a week  Your patient has identified these potential barriers to change:  Finances Lack of Family Support  Your patient has identified their diabetes self-care support plan as  Careers information officer Plan:  Attend Optional Core 4 in 4 months

## 2015-04-11 ENCOUNTER — Other Ambulatory Visit: Payer: Self-pay | Admitting: Cardiovascular Disease

## 2015-04-16 ENCOUNTER — Other Ambulatory Visit: Payer: Self-pay | Admitting: Cardiovascular Disease

## 2015-04-16 NOTE — Telephone Encounter (Signed)
Rx(s) sent to pharmacy electronically.  

## 2015-04-24 ENCOUNTER — Ambulatory Visit (INDEPENDENT_AMBULATORY_CARE_PROVIDER_SITE_OTHER): Payer: Managed Care, Other (non HMO) | Admitting: Nurse Practitioner

## 2015-04-24 ENCOUNTER — Encounter: Payer: Self-pay | Admitting: Nurse Practitioner

## 2015-04-24 VITALS — BP 144/74 | HR 82 | Ht 64.0 in | Wt 227.4 lb

## 2015-04-24 DIAGNOSIS — I119 Hypertensive heart disease without heart failure: Secondary | ICD-10-CM

## 2015-04-24 DIAGNOSIS — Z720 Tobacco use: Secondary | ICD-10-CM

## 2015-04-24 DIAGNOSIS — E119 Type 2 diabetes mellitus without complications: Secondary | ICD-10-CM

## 2015-04-24 DIAGNOSIS — I2510 Atherosclerotic heart disease of native coronary artery without angina pectoris: Secondary | ICD-10-CM | POA: Diagnosis not present

## 2015-04-24 DIAGNOSIS — E785 Hyperlipidemia, unspecified: Secondary | ICD-10-CM

## 2015-04-24 MED ORDER — METOPROLOL TARTRATE 50 MG PO TABS: 75.0000 mg | ORAL_TABLET | Freq: Two times a day (BID) | ORAL | Status: DC

## 2015-04-24 MED ORDER — HYDRALAZINE HCL 50 MG PO TABS: 50.0000 mg | ORAL_TABLET | Freq: Two times a day (BID) | ORAL | Status: DC

## 2015-04-24 MED ORDER — ISOSORBIDE MONONITRATE ER 30 MG PO TB24: 30.0000 mg | ORAL_TABLET | Freq: Every day | ORAL | Status: DC

## 2015-04-24 MED ORDER — AMLODIPINE BESYLATE 10 MG PO TABS: 10.0000 mg | ORAL_TABLET | Freq: Every day | ORAL | Status: DC

## 2015-04-24 MED ORDER — TRIAMTERENE-HCTZ 37.5-25 MG PO TABS: 1.0000 | ORAL_TABLET | Freq: Every day | ORAL | Status: DC

## 2015-04-24 MED ORDER — LISINOPRIL 20 MG PO TABS: 20.0000 mg | ORAL_TABLET | Freq: Two times a day (BID) | ORAL | Status: DC

## 2015-04-24 NOTE — Progress Notes (Signed)
Office Visit    Patient Name: [name] Date of Encounter: 04/24/2015  Primary Care Provider:  No PCP Per Patient Primary Cardiologist:  Adora Fridge, MD   Chief Complaint   55 year old female with a history of nonobstructive CAD who presents for follow-up. Past Medical History    Past Medical History  Diagnosis Date  . Angina   . Hypertension   . CAD (coronary artery disease), non obstructive CAD in 2010     a. 2013 Cath: LAD 60, RCA 18m, 70d; b. 2014 Cath: stable anatomy.  . Hypertensive heart disease   . Tobacco abuse   . Family history of premature CAD   . Reflux   . Palpitations   . Diabetes mellitus without complication ([place])     a. 12/2014 HbA1c 6.5; b. Rx Glipizide - not taking.  Marland Kitchen Hyperlipidemia    Past Surgical History  Procedure Laterality Date  . Wrist fracture surgery Right ~ 1996  . Renal doppler  06/05/2009    No evidence of significant diameter reduction  . Cardiac catheterization  02/07/2008    Recommendation-F/U stress test  . Cardiac catheterization  04/16/2011    Recommendation-Increase medical therapy trial  . Cardiovascular stress test  03/25/2011    Suspect subtle anterior septal ischemia  . Transthoracic echocardiogram  01/25/2008    EF 60-65%, Moderate concentric LVH  . Left heart catheterization with coronary angiogram N/A 04/16/2011    Procedure: LEFT HEART CATHETERIZATION WITH CORONARY ANGIOGRAM;  Surgeon: Troy Sine, MD;  Location: [facility] CATH LAB;  Service: Cardiovascular;  Laterality: N/A;  . Left heart catheterization with coronary angiogram N/A 09/01/2012    Procedure: LEFT HEART CATHETERIZATION WITH CORONARY ANGIOGRAM;  Surgeon: Pixie Casino, MD;  Location: [facility] CATH LAB;  Service: Cardiovascular;  Laterality: N/A;  . Fracture surgery    . Appendectomy  1974    Allergies  Allergies  Allergen Reactions  . Bupropion     Other reaction(s): Other Suicidal thoughts and increased depression  . Rosuvastatin Other (See Comments)   myalgia  . Simvastatin     myalgias  . Wellbutrin [Bupropion Hcl]     unknown  . Zetia [Ezetimibe]     myalgias    History of Present Illness    55 year old female with the above accomplished past medical history. She does have a history of chest pain and nonobstructive CAD by catheterization in 2013 and again in 2014. She also has a history of hypertension, hyperlipidemia, obesity, and ongoing tobacco abuse. She was admitted to [facility] in December 2015 with complaints of dyspnea and chest tightness. She was treated for viral upper respiratory infection and seen by cardiology. She had negative heart enzymes and stable ECG. It was not felt that she required any further ischemic evaluation. Since then, she has done reasonably well. She denies any chest pain or dyspnea and further, has not been experiencing any PND, orthopnea, dizziness, syncope, edema, or early satiety. She continues to smoke 5 cigarettes per day and says that she had used a nicotine patch for a period of time but then ran out of them. She would be interested in going back on this.  Home Medications    Prior to Admission medications   Medication Sig Start Date End Date Taking? Authorizing Provider  amLODipine (NORVASC) 10 MG tablet Take 1 tablet (10 mg total) by mouth daily. <PLEASE MAKE APPOINTMENT FOR REFILLS> 04/24/15  Yes Rogelia Mire, NP  aspirin 81 MG tablet Take 81 mg  by mouth at bedtime.   Yes Historical Provider, MD  isosorbide mononitrate (IMDUR) 30 MG 24 hr tablet Take 1 tablet (30 mg total) by mouth daily. 04/24/15  Yes Rogelia Mire, NP  lisinopril (PRINIVIL,ZESTRIL) 20 MG tablet Take 1 tablet (20 mg total) by mouth 2 (two) times daily. 04/24/15  Yes Rogelia Mire, NP  niacin 500 MG tablet Take 500 mg by mouth at bedtime.   Yes Historical Provider, MD  triamterene-hydrochlorothiazide (MAXZIDE-25) 37.5-25 MG tablet Take 1 tablet by mouth daily. 04/24/15  Yes Rogelia Mire, NP  hydrALAZINE  (APRESOLINE) 50 MG tablet Take 1 tablet (50 mg total) by mouth 2 (two) times daily. 04/24/15   Rogelia Mire, NP  metoprolol (LOPRESSOR) 50 MG tablet Take 1.5 tablets (75 mg total) by mouth 2 (two) times daily. 04/24/15   Rogelia Mire, NP    Review of Systems    As above, overall doing well. She denies chest pain, palpitations, dyspnea, pnd, orthopnea, n, v, dizziness, syncope, edema, weight gain, or early satiety.  All other systems reviewed and are otherwise negative except as noted above.  Physical Exam    VS:  BP 144/74 mmHg  Pulse 82  Ht 5\' 4"  (1.626 m)  Wt 227 lb 6.4 oz (103.148 kg)  BMI 39.01 kg/m2 , BMI Body mass index is 39.01 kg/(m^2). GEN: Well nourished, well developed, in no acute distress. HEENT: normal. Neck: Supple, no JVD, carotid bruits, or masses. Cardiac: RRR, no murmurs, rubs, or gallops. No clubbing, cyanosis, edema.  Radials/DP/PT 2+ and equal bilaterally.  Respiratory:  Respirations regular and unlabored, diminished breath sounds bilaterally. GI: Soft, nontender, nondistended, BS + x 4. MS: no deformity or atrophy. Skin: warm and dry, no rash. Neuro:  Strength and sensation are intact. Psych: Normal affect.  Accessory Clinical Findings    None  Assessment & Plan    1.  Nonobstructive coronary artery disease: Patient has been doing reasonably well without any chest pain or significant dyspnea on exertion. She is relatively sedentary however. She remains on aspirin, nitrate, beta blocker, and calcium channel blocker therapy. She is not on a statin secondary to multiple intolerances. She is also intolerant to Zetia.  2. Hypertensive heart disease: Blood pressure is elevated today at 144/74. She says that that is actually pretty good for her as she is typically in the 170s. We have refilled multiple medications today and also increase her hydralazine to 50 mg twice a day. She notes that she has been off of Maxzide for about 2 weeks secondary to running  out. We'll resume this and follow-up basic metabolic panel in 1 week since she does have stage III kidney disease.  3. Hyperlipidemia: Unfortunate, she is statin intolerant. She is also intolerant to Zetia. She could be considered for an injectable agent in the future though her LDL in December 2016 was 102 without therapy.  4. Morbid obesity: We discussed the importance of regular activity as well as caloric restriction with a goal of weight loss.  5. Ongoing tobacco abuse: Patient continues to smoke 5 cigarettes per day. She plans to try a nicotine patch again as she was able to quit using one in the past.  6. Stage III chronic kidney disease: Follow-up basic metabolic panel next week as we are refilling her diuretic.  7. Type 2 diabetes mellitus: This was diagnosed in December. Her A1c was 6.5 at that time and she was prescribed glipizide by the hospitalist team. She has not been  taking it and has not followed up with the primary care provider. She does plan to follow up with primary care for further instruction.  8. Disposition: Follow-up with Dr. Gwenlyn Found in 6 months or sooner if necessary.   Murray Hodgkins, NP 04/24/2015, 12:52 PM

## 2015-04-24 NOTE — Patient Instructions (Addendum)
Your physician recommends that you return for lab work in: 1 week.  Your physician has recommended you make the following change in your medication: the Hydralazine has been changed to 50 mg tablets and are to be taken twice daily.   Your physician wants you to follow-up in: 6 months or sooner if needed with Dr Gwenlyn Found. You will receive a reminder letter in the mail two months in advance. If you don't receive a letter, please call our office to schedule the follow-up appointment.   Refills have been sent to your pharmacy.

## 2015-05-31 LAB — BASIC METABOLIC PANEL
BUN: 23 mg/dL (ref 7–25)
CHLORIDE: 103 mmol/L (ref 98–110)
CO2: 21 mmol/L (ref 20–31)
Calcium: 9.1 mg/dL (ref 8.6–10.4)
Creat: 1.25 mg/dL — ABNORMAL HIGH (ref 0.50–1.05)
Glucose, Bld: 118 mg/dL — ABNORMAL HIGH (ref 65–99)
POTASSIUM: 3.8 mmol/L (ref 3.5–5.3)
SODIUM: 140 mmol/L (ref 135–146)

## 2015-06-04 ENCOUNTER — Ambulatory Visit: Payer: Managed Care, Other (non HMO)

## 2015-10-03 ENCOUNTER — Other Ambulatory Visit: Payer: Self-pay | Admitting: Nurse Practitioner

## 2015-10-03 ENCOUNTER — Other Ambulatory Visit: Payer: Self-pay | Admitting: Cardiovascular Disease

## 2015-10-06 ENCOUNTER — Other Ambulatory Visit: Payer: Self-pay

## 2015-10-06 MED ORDER — TRIAMTERENE-HCTZ 37.5-25 MG PO TABS: 1.0000 | ORAL_TABLET | Freq: Every day | ORAL | 0 refills | Status: DC

## 2015-10-06 MED ORDER — AMLODIPINE BESYLATE 10 MG PO TABS: 10.0000 mg | ORAL_TABLET | Freq: Every day | ORAL | 3 refills | Status: DC

## 2015-10-06 NOTE — Telephone Encounter (Signed)
Rx request sent to pharmacy.  

## 2015-11-05 ENCOUNTER — Encounter: Payer: Self-pay | Admitting: Cardiology

## 2015-11-05 ENCOUNTER — Ambulatory Visit (INDEPENDENT_AMBULATORY_CARE_PROVIDER_SITE_OTHER): Payer: Managed Care, Other (non HMO) | Admitting: Cardiology

## 2015-11-05 VITALS — BP 128/86 | HR 57 | Ht 64.0 in | Wt 231.0 lb

## 2015-11-05 DIAGNOSIS — I119 Hypertensive heart disease without heart failure: Secondary | ICD-10-CM | POA: Diagnosis not present

## 2015-11-05 DIAGNOSIS — E785 Hyperlipidemia, unspecified: Secondary | ICD-10-CM | POA: Diagnosis not present

## 2015-11-05 DIAGNOSIS — I10 Essential (primary) hypertension: Secondary | ICD-10-CM

## 2015-11-05 DIAGNOSIS — Z720 Tobacco use: Secondary | ICD-10-CM

## 2015-11-05 DIAGNOSIS — R5383 Other fatigue: Secondary | ICD-10-CM

## 2015-11-05 DIAGNOSIS — I2510 Atherosclerotic heart disease of native coronary artery without angina pectoris: Secondary | ICD-10-CM

## 2015-11-05 DIAGNOSIS — Z79899 Other long term (current) drug therapy: Secondary | ICD-10-CM

## 2015-11-05 DIAGNOSIS — E669 Obesity, unspecified: Secondary | ICD-10-CM

## 2015-11-05 LAB — CBC
HCT: 41.6 % (ref 35.0–45.0)
Hemoglobin: 13.6 g/dL (ref 11.7–15.5)
MCH: 28.5 pg (ref 27.0–33.0)
MCHC: 32.7 g/dL (ref 32.0–36.0)
MCV: 87 fL (ref 80.0–100.0)
MPV: 11.4 fL (ref 7.5–12.5)
Platelets: 256 10*3/uL (ref 140–400)
RBC: 4.78 MIL/uL (ref 3.80–5.10)
RDW: 14.3 % (ref 11.0–15.0)
WBC: 7.9 10*3/uL (ref 3.8–10.8)

## 2015-11-05 LAB — COMPREHENSIVE METABOLIC PANEL
ALT: 53 U/L — ABNORMAL HIGH (ref 6–29)
AST: 49 U/L — ABNORMAL HIGH (ref 10–35)
Albumin: 3.5 g/dL — ABNORMAL LOW (ref 3.6–5.1)
Alkaline Phosphatase: 40 U/L (ref 33–130)
BUN: 22 mg/dL (ref 7–25)
CO2: 24 mmol/L (ref 20–31)
Calcium: 8.6 mg/dL (ref 8.6–10.4)
Chloride: 105 mmol/L (ref 98–110)
Creat: 1.36 mg/dL — ABNORMAL HIGH (ref 0.50–1.05)
Glucose, Bld: 104 mg/dL — ABNORMAL HIGH (ref 65–99)
Potassium: 4 mmol/L (ref 3.5–5.3)
Sodium: 139 mmol/L (ref 135–146)
Total Bilirubin: 0.4 mg/dL (ref 0.2–1.2)
Total Protein: 6.4 g/dL (ref 6.1–8.1)

## 2015-11-05 LAB — LIPID PANEL
Cholesterol: 173 mg/dL (ref 125–200)
HDL: 22 mg/dL — ABNORMAL LOW (ref >=46)
LDL Cholesterol: 89 mg/dL (ref <130)
Total CHOL/HDL Ratio: 7.9 Ratio — ABNORMAL HIGH (ref <=5.0)
Triglycerides: 312 mg/dL — ABNORMAL HIGH (ref <150)
VLDL: 62 mg/dL — ABNORMAL HIGH (ref <30)

## 2015-11-05 LAB — TSH: TSH: 1.93 mIU/L

## 2015-11-05 MED ORDER — ISOSORBIDE MONONITRATE ER 30 MG PO TB24: 30.0000 mg | ORAL_TABLET | Freq: Every day | ORAL | 3 refills | Status: DC

## 2015-11-05 MED ORDER — ROSUVASTATIN CALCIUM 5 MG PO TABS: 5.0000 mg | ORAL_TABLET | ORAL | 3 refills | Status: DC

## 2015-11-05 MED ORDER — LISINOPRIL 20 MG PO TABS: 20.0000 mg | ORAL_TABLET | Freq: Two times a day (BID) | ORAL | 3 refills | Status: DC

## 2015-11-05 MED ORDER — TRIAMTERENE-HCTZ 37.5-25 MG PO TABS: 1.0000 | ORAL_TABLET | Freq: Every day | ORAL | 3 refills | Status: DC

## 2015-11-05 MED ORDER — AMLODIPINE BESYLATE 10 MG PO TABS: 10.0000 mg | ORAL_TABLET | Freq: Every day | ORAL | 3 refills | Status: DC

## 2015-11-05 MED ORDER — HYDRALAZINE HCL 50 MG PO TABS: 50.0000 mg | ORAL_TABLET | Freq: Two times a day (BID) | ORAL | 3 refills | Status: DC

## 2015-11-05 MED ORDER — METOPROLOL TARTRATE 50 MG PO TABS: 75.0000 mg | ORAL_TABLET | Freq: Two times a day (BID) | ORAL | 3 refills | Status: DC

## 2015-11-05 NOTE — Assessment & Plan Note (Addendum)
BMI 39- wgt loss discussed. I suspect she has sleep apnea. She will consider sleep study.

## 2015-11-05 NOTE — Assessment & Plan Note (Signed)
Controlled.  

## 2015-11-05 NOTE — Assessment & Plan Note (Signed)
Statin intolerance. I suggested we check a fasting lipids today and start her on Crestor 5 mg twice a week. I'll also arrange for a consult with our pharmacist to consider PCSK9 therapy

## 2015-11-05 NOTE — Patient Instructions (Addendum)
Medication Instructions:  START Crestor 5 mg Take 1 tablet twice a week RX printed and given to patient   Labwork: Your physician recommends that you return for lab work in: TODAY FASTING-LIPID, CMET, CBC, TSH  Testing/Procedures: NONE   Follow-Up: Your physician wants you to follow-up in: [place], [place]. You will receive a reminder letter in the mail two months in advance. If you don't receive a letter, please call our office to schedule the follow-up appointment.  Your physician recommends that you schedule a follow-up appointment in: [facility]  Any Other Special Instructions Will Be Listed Below (If Applicable).  [place]   If you need a refill on your cardiac medications before your next appointment, please call your pharmacy.   You Can Quit Smoking  If you are ready to quit smoking or are thinking about it, congratulations! You have chosen to help yourself be healthier and live longer! There are lots of different ways to quit smoking. Nicotine gum, nicotine patches, a nicotine inhaler, or nicotine nasal spray can help with physical craving. Hypnosis, support groups, and medicines help break the habit of smoking. TIPS TO GET OFF AND STAY OFF CIGARETTES  Learn to predict your moods. Do not let a bad situation be your excuse to have a cigarette. Some situations in your life might tempt you to have a cigarette.  Ask friends and co-workers not to smoke around you.  Make your home smoke-free.  Never have "just one" cigarette. It leads to wanting another and another. Remind yourself of your decision to quit.  On a card, make a list of your reasons for not smoking. Read it at least the same number of times a day as you have a cigarette. Tell yourself everyday, "I do not want to smoke. I choose not to smoke."  Ask someone at home or work to help you with your plan to quit smoking.  Have something planned after  you eat or have a cup of coffee. Take a walk or get other exercise to perk you up. This will help to keep you from overeating.  Try a relaxation exercise to calm you down and decrease your stress. Remember, you may be tense and nervous the first two weeks after you quit. This will pass.  Find new activities to keep your hands busy. Play with a pen, coin, or rubber band. Doodle or draw things on paper.  Brush your teeth right after eating. This will help cut down the craving for the taste of tobacco after meals. You can try mouthwash too.  Try gum, breath mints, or diet candy to keep something in your mouth. IF YOU SMOKE AND WANT TO QUIT:  Do not stock up on cigarettes. Never buy a carton. Wait until one pack is finished before you buy another.  Never carry cigarettes with you at work or at home.  Keep cigarettes as far away from you as possible. Leave them with someone else.  Never carry matches or a lighter with you.  Ask yourself, "Do I need this cigarette or is this just a reflex?"  Bet with someone that you can quit. Put cigarette money in a piggy bank every morning. If you smoke, you give up the money. If you do not smoke, by the end of the week, you keep the money.  Keep trying. It takes 21 days to change a habit!  Talk to your doctor about using medicines to  help you quit. These include nicotine replacement gum, lozenges, or skin patches.  This information is not intended to replace advice given to you by your health care provider. Make sure you discuss any questions you have with your health care provider.  Document Released: 10/24/2008 Document Revised: 03/22/2011 Document Reviewed: 10/24/2008 Elsevier Interactive Patient Education 2016 [place].  Heart-Healthy Eating Plan Many factors influence your heart health, including eating and exercise habits. Heart (coronary) risk increases with abnormal blood fat (lipid) levels. Heart-healthy meal planning includes limiting  unhealthy fats, increasing healthy fats, and making other small dietary changes. This includes maintaining a healthy body weight to help keep lipid levels within a normal range. WHAT IS MY PLAN?  Your health care provider recommends that you:  Get no more than _________% of the total calories in your daily diet from fat.  Limit your intake of saturated fat to less than _________% of your total calories each day.  Limit the amount of cholesterol in your diet to less than _________ mg per day. WHAT TYPES OF FAT SHOULD I CHOOSE?  Choose healthy fats more often. Choose monounsaturated and polyunsaturated fats, such as olive oil and canola oil, flaxseeds, walnuts, almonds, and seeds.  Eat more omega-3 fats. Good choices include salmon, mackerel, sardines, tuna, flaxseed oil, and ground flaxseeds. Aim to eat fish at least two times each week.  Limit saturated fats. Saturated fats are primarily found in animal products, such as meats, butter, and cream. Plant sources of saturated fats include palm oil, palm kernel oil, and coconut oil.  Avoid foods with partially hydrogenated oils in them. These contain trans fats. Examples of foods that contain trans fats are stick margarine, some tub margarines, cookies, crackers, and other baked goods. WHAT GENERAL GUIDELINES DO I NEED TO FOLLOW?  Check food labels carefully to identify foods with trans fats or high amounts of saturated fat.  Fill one half of your plate with vegetables and green salads. Eat 4-5 servings of vegetables per day. A serving of vegetables equals 1 cup of raw leafy vegetables,  cup of raw or cooked cut-up vegetables, or  cup of vegetable juice.  Fill one fourth of your plate with whole grains. Look for the word "whole" as the first word in the ingredient list.  Fill one fourth of your plate with lean protein foods.  Eat 4-5 servings of fruit per day. A serving of fruit equals one medium whole fruit,  cup of dried fruit,  cup of  fresh, frozen, or canned fruit, or  cup of 100% fruit juice.  Eat more foods that contain soluble fiber. Examples of foods that contain this type of fiber are apples, broccoli, carrots, beans, peas, and barley. Aim to get 20-30 g of fiber per day.  Eat more home-cooked food and less restaurant, buffet, and fast food.  Limit or avoid alcohol.  Limit foods that are high in starch and sugar.  Avoid fried foods.  Cook foods by using methods other than frying. Baking, boiling, grilling, and broiling are all great options. Other fat-reducing suggestions include: ? Removing the skin from poultry. ? Removing all visible fats from meats. ? Skimming the fat off of stews, soups, and gravies before serving them. ? Steaming vegetables in water or broth.  Lose weight if you are overweight. Losing just 5-10% of your initial body weight can help your overall health and prevent diseases such as diabetes and heart disease.  Increase your consumption of nuts, legumes, and seeds to 4-5  servings per week. One serving of dried beans or legumes equals  cup after being cooked, one serving of nuts equals 1 ounces, and one serving of seeds equals  ounce or 1 tablespoon.  You may need to monitor your salt (sodium) intake, especially if you have high blood pressure. Talk with your health care provider or dietitian to get more information about reducing sodium. WHAT FOODS CAN I EAT? Grains Breads, including [place], white, pita, wheat, raisin, rye, oatmeal, and [place]. Tortillas that are neither fried nor made with lard or trans fat. Low-fat rolls, including hotdog and hamburger buns and English muffins. Biscuits. Muffins. Waffles. Pancakes. Light popcorn. Whole-grain cereals. Flatbread. Melba toast. Pretzels. Breadsticks. Rusks. Low-fat snacks and crackers, including oyster, saltine, matzo, graham, animal, and rye. Rice and pasta, including brown rice and those that are made with whole wheat. Vegetables All  vegetables. Fruits All fruits, but limit coconut. Meats and Other Protein Sources Lean, well-trimmed beef, veal, pork, and lamb. Chicken and [place] without skin. All fish and shellfish. Wild duck, rabbit, pheasant, and venison. Egg whites or low-cholesterol egg substitutes. Dried beans, peas, lentils, and tofu.Seeds and most nuts. Dairy Low-fat or nonfat cheeses, including ricotta, string, and mozzarella. Skim or 1% milk that is liquid, powdered, or evaporated. Buttermilk that is made with low-fat milk. Nonfat or low-fat yogurt. Beverages Mineral water. Diet carbonated beverages. Sweets and Desserts Sherbets and fruit ices. Honey, jam, marmalade, jelly, and syrups. Meringues and gelatins. Pure sugar candy, such as hard candy, jelly beans, gumdrops, mints, marshmallows, and small amounts of dark chocolate. [employer] Eat all sweets and desserts in moderation. Fats and Oils Nonhydrogenated (trans-free) margarines. Vegetable oils, including soybean, sesame, sunflower, olive, peanut, safflower, corn, canola, and cottonseed. Salad dressings or mayonnaise that are made with a vegetable oil. Limit added fats and oils that you use for cooking, baking, salads, and as spreads. Other Cocoa powder. Coffee and tea. All seasonings and condiments. The items listed above may not be a complete list of recommended foods or beverages. Contact your dietitian for more options. WHAT FOODS ARE NOT RECOMMENDED? Grains Breads that are made with saturated or trans fats, oils, or whole milk. Croissants. Butter rolls. Cheese breads. Sweet rolls. Donuts. Buttered popcorn. Chow mein noodles. High-fat crackers, such as cheese or butter crackers. Meats and Other Protein Sources Fatty meats, such as hotdogs, short ribs, sausage, spareribs, bacon, ribeye roast or steak, and mutton. High-fat deli meats, such as salami and bologna. Caviar. Domestic duck and goose. Organ meats, such as kidney, liver, sweetbreads, brains,  gizzard, chitterlings, and heart. Dairy Cream, sour cream, cream cheese, and creamed cottage cheese. Whole milk cheeses, including blue (bleu), Monterey Jack, [place], [place], American, [place], Swiss, [place], [place], and [place]. Whole or 2% milk that is liquid, evaporated, or condensed. Whole buttermilk. Cream sauce or high-fat cheese sauce. Yogurt that is made from whole milk. Beverages Regular sodas and drinks with added sugar. Sweets and Desserts Frosting. Pudding. Cookies. Cakes other than angel food cake. Candy that has milk chocolate or white chocolate, hydrogenated fat, butter, coconut, or unknown ingredients. Buttered syrups. Full-fat ice cream or ice cream drinks. Fats and Oils Gravy that has suet, meat fat, or shortening. Cocoa butter, hydrogenated oils, palm oil, coconut oil, palm kernel oil. These can often be found in baked products, candy, fried foods, nondairy creamers, and whipped toppings. Solid fats and shortenings, including bacon fat, salt pork, lard, and butter. Nondairy cream substitutes, such as coffee creamers and sour cream substitutes. Salad dressings  that are made of unknown oils, cheese, or sour cream. The items listed above may not be a complete list of foods and beverages to avoid. Contact your dietitian for more information.  This information is not intended to replace advice given to you by your health care provider. Make sure you discuss any questions you have with your health care provider.  Document Released: 10/07/2007 Document Revised: 01/18/2014 Document Reviewed: 06/21/2013 Elsevier Interactive Patient Education [employer].

## 2015-11-05 NOTE — Assessment & Plan Note (Signed)
Tobacco cessation discussed 

## 2015-11-05 NOTE — Progress Notes (Signed)
11/05/2015 [name]   02-06-1960  [id number]  Primary Physician No PCP Per Patient Primary Cardiologist: Dr Gwenlyn Found  HPI:  55 y.o. obese Caucasian female, no Primary Care provider, with a past medical history significant for CAD. She has been cathed 3 times- 2010, 2013, and Aug 2014. Cath done 09/01/12 showed 60% RCA narrowing. Other problems include HTN, obesity with metabolic syndrome, smoking, HLD with statin intolerance, and CRI-3. She is in the office for routine follow up. She had quit smoking but started back-" I have a 53 y/o" and it helps with the stress. She works in a pay role office. She and her co-workers try and walk during there breaks. She denies any anginal sounding chest pain. She is concerned about her wgt and we discussed this at length.     Current Outpatient Prescriptions  Medication Sig Dispense Refill  . amLODipine (NORVASC) 10 MG tablet Take 1 tablet (10 mg total) by mouth daily. 90 tablet 3  . aspirin 81 MG tablet Take 81 mg by mouth at bedtime.    . hydrALAZINE (APRESOLINE) 50 MG tablet Take 1 tablet (50 mg total) by mouth 2 (two) times daily. 180 tablet 3  . isosorbide mononitrate (IMDUR) 30 MG 24 hr tablet Take 1 tablet (30 mg total) by mouth daily. 90 tablet 3  . lisinopril (PRINIVIL,ZESTRIL) 20 MG tablet Take 1 tablet (20 mg total) by mouth 2 (two) times daily. 180 tablet 3  . metoprolol (LOPRESSOR) 50 MG tablet Take 1.5 tablets (75 mg total) by mouth 2 (two) times daily. 270 tablet 3  . niacin 500 MG tablet Take 500 mg by mouth at bedtime.    . triamterene-hydrochlorothiazide (MAXZIDE-25) 37.5-25 MG tablet Take 1 tablet by mouth daily. 90 tablet 3  . [START ON 11/06/2015] rosuvastatin (CRESTOR) 5 MG tablet Take 1 tablet (5 mg total) by mouth 2 (two) times a week. 30 tablet 3   No current facility-administered medications for this visit.     Allergies  Allergen Reactions  . Bupropion     Other reaction(s): Other Suicidal thoughts and increased  depression  . Rosuvastatin Other (See Comments)    myalgia  . Simvastatin     myalgias  . Wellbutrin [Bupropion Hcl]     unknown  . Zetia [Ezetimibe]     myalgias    Social History   Social History  . Marital status: Divorced    Spouse name: N/A  . Number of children: N/A  . Years of education: N/A   Occupational History  . Not on file.   Social History Main Topics  . Smoking status: Current Every Day Smoker    Packs/day: 0.50    Years: 30.00    Types: Cigarettes  . Smokeless tobacco: Never Used     Comment: 4-5 cigarettes per day  . Alcohol use No  . Drug use: No  . Sexual activity: Not Currently   Other Topics Concern  . Not on file   Social History Narrative  . No narrative on file     Review of Systems: General: negative for chills, fever, night sweats or weight changes.  Cardiovascular: negative for chest pain, dyspnea on exertion, edema, orthopnea, palpitations, paroxysmal nocturnal dyspnea or shortness of breath Dermatological: negative for rash Respiratory: negative for cough or wheezing Urologic: negative for hematuria Abdominal: negative for nausea, vomiting, diarrhea, bright red blood per rectum, melena, or hematemesis Neurologic: negative for visual changes, syncope, or dizziness All other systems reviewed and are otherwise  negative except as noted above. Daytime fatigue, snores, poor sleep pattern   Blood pressure 128/86, pulse (!) 57, height 5\' 4"  (1.626 m), weight 231 lb (104.8 kg).  General appearance: alert, cooperative, no distress and morbidly obese Neck: no carotid bruit and no JVD Lungs: clear to auscultation bilaterally Heart: regular rate and rhythm Extremities: extremities normal, atraumatic, no cyanosis or edema Skin: Skin color, texture, turgor normal. No rashes or lesions Neurologic: Grossly normal  EKG NSR, TWI 1-AVL  ASSESSMENT AND PLAN:   CAD (coronary artery disease) Cath 2010, 2013, and Aug 2014 after abnormal Myoview-  moderate disease- 60% mRCA, no significant LAD or CFX disease.  No recent angina  Dyslipidemia- low HDL Statin intolerance. I suggested we check a fasting lipids today and start her on Crestor 5 mg twice a week. I'll also arrange for a consult with our pharmacist to consider PCSK9 therapy  Obesity (BMI 30-39.9) BMI 39- wgt loss discussed. I suspect she has sleep apnea. She will consider sleep study.  Essential hypertension Controlled  Tobacco abuse Tobacco cessation discussed   PLAN  I had a long discussion with her about wgt loss, smoking cessation, obtaining a sleep study, and considering a PCSK9 agent. She will consider sleep study and let us know. She agreed to try Crestor 5 mg twice a week and will f/u in a few weeks with our pharmacist to consider further Rx.   Kerin Ransom PA-C 11/05/2015 11:41 AM

## 2015-11-05 NOTE — Assessment & Plan Note (Signed)
Cath 2010, 2013, and Aug 2014 after abnormal Myoview- moderate disease- 60% mRCA, no significant LAD or CFX disease.  No recent angina

## 2015-11-06 ENCOUNTER — Telehealth: Payer: Self-pay | Admitting: *Deleted

## 2015-11-06 NOTE — Telephone Encounter (Signed)
Left message for pt to call.

## 2015-11-06 NOTE — Telephone Encounter (Signed)
Spoke with pt, .aware of results.  

## 2015-11-06 NOTE — Telephone Encounter (Signed)
Returning your call,I think I might have lost her call.

## 2015-11-06 NOTE — Telephone Encounter (Signed)
-----   Message from Erlene Quan, [place] sent at 11/06/2015  8:01 AM EDT ----- Please let pt know her labs looked OK- LDL was 89, goal is less than 70. Her LFTs are slightly elevated but this appears to be chronic and may be from fatty liver. She should have a fasting lipid profile and CMET after 3 months of statin Rx.  LUKE KILROY PA-C 11/06/2015 8:00 AM

## 2015-11-27 ENCOUNTER — Ambulatory Visit (INDEPENDENT_AMBULATORY_CARE_PROVIDER_SITE_OTHER): Payer: Managed Care, Other (non HMO) | Admitting: Pharmacist Clinician (PhC)/ Clinical Pharmacy Specialist

## 2015-11-27 DIAGNOSIS — E785 Hyperlipidemia, unspecified: Secondary | ICD-10-CM | POA: Diagnosis not present

## 2015-11-27 MED ORDER — OMEGA-3-ACID ETHYL ESTERS 1 G PO CAPS: 2.0000 g | ORAL_CAPSULE | Freq: Two times a day (BID) | ORAL | 6 refills | Status: DC

## 2015-11-27 MED ORDER — EZETIMIBE 10 MG PO TABS: 10.0000 mg | ORAL_TABLET | Freq: Every day | ORAL | 6 refills | Status: DC

## 2015-11-27 NOTE — Assessment & Plan Note (Signed)
Patient with mixed hyperlipidemia (low HDL, high TG, LDL) who has developed myalgias with both simvastatin and rosuvastatin.  We had a long discussion about diet and exercise.  She was encouraged to cut sodas to no more that 1 per day for a month then try to switch entirely to water or flavored waters.  She was also encouraged to eat more nuts and was given information about both low TG and DASH eating plans.  She will try walking with a co-worker daily, the lady does 2 laps of their parking lot each day (about a mile total).  Will start ezetimibe and lovaza now, patient aware to call if either is cost prohibitive, as we may be able to get better pricing from [facility] in [place]  Will repeat labs in late January and see her following that.

## 2015-11-27 NOTE — Patient Instructions (Signed)
Start ezetimbe (Zetia) 10 mg once daily and omega fish oil 1 capsule twice daily for a week then increase to 2 capsules twice daily.  If you have problems with cost on either of these medications, please call [facility] at [phone].  Food Choices to Lower Your Triglycerides Triglycerides are a type of fat in your blood. High levels of triglycerides can increase the risk of heart disease and stroke. If your triglyceride levels are high, the foods you eat and your eating habits are very important. Choosing the right foods can help lower your triglycerides. What general guidelines do I need to follow?  Lose weight if you are overweight.  Limit or avoid alcohol.  Fill one half of your plate with vegetables and green salads.  Limit fruit to two servings a day. Choose fruit instead of juice.  Make one fourth of your plate whole grains. Look for the word "whole" as the first word in the ingredient list.  Fill one fourth of your plate with lean protein foods.  Enjoy fatty fish (such as salmon, mackerel, sardines, and tuna) three times a week.  Choose healthy fats.  Limit foods high in starch and sugar.  Eat more home-cooked food and less restaurant, buffet, and fast food.  Limit fried foods.  Cook foods using methods other than frying.  Limit saturated fats.  Check ingredient lists to avoid foods with partially hydrogenated oils (trans fats) in them. What foods can I eat? Grains  Whole grains, such as whole wheat or whole grain breads, crackers, cereals, and pasta. Unsweetened oatmeal, bulgur, barley, quinoa, or brown rice. Corn or whole wheat flour tortillas. Vegetables  Fresh or frozen vegetables (raw, steamed, roasted, or grilled). Green salads. Fruits  All fresh, canned (in natural juice), or frozen fruits. Meat and Other Protein Products  Ground beef (85% or leaner), grass-fed beef, or beef trimmed of fat. Skinless chicken or [place]. Ground chicken or [place]. Pork  trimmed of fat. All fish and seafood. Eggs. Dried beans, peas, or lentils. Unsalted nuts or seeds. Unsalted canned or dry beans. Dairy  Low-fat dairy products, such as skim or 1% milk, 2% or reduced-fat cheeses, low-fat ricotta or cottage cheese, or plain low-fat yogurt. Fats and Oils  Tub margarines without trans fats. Light or reduced-fat mayonnaise and salad dressings. Avocado. Safflower, olive, or canola oils. Natural peanut or almond butter. The items listed above may not be a complete list of recommended foods or beverages. Contact your dietitian for more options.  What foods are not recommended? Grains  White bread. White pasta. White rice. Cornbread. Bagels, pastries, and croissants. Crackers that contain trans fat. Vegetables  White potatoes. Corn. Creamed or fried vegetables. Vegetables in a cheese sauce. Fruits  Dried fruits. Canned fruit in light or heavy syrup. Fruit juice. Meat and Other Protein Products  Fatty cuts of meat. Ribs, chicken wings, bacon, sausage, bologna, salami, chitterlings, fatback, hot dogs, bratwurst, and packaged luncheon meats. Dairy  Whole or 2% milk, cream, half-and-half, and cream cheese. Whole-fat or sweetened yogurt. Full-fat cheeses. Nondairy creamers and whipped toppings. Processed cheese, cheese spreads, or cheese curds. Sweets and Desserts  Corn syrup, sugars, honey, and molasses. Candy. Jam and jelly. Syrup. Sweetened cereals. Cookies, pies, cakes, donuts, muffins, and ice cream. Fats and Oils  Butter, stick margarine, lard, shortening, ghee, or bacon fat. Coconut, palm kernel, or palm oils. Beverages  Alcohol. Sweetened drinks (such as sodas, lemonade, and fruit drinks or punches). The items listed above may not be a complete list  of foods and beverages to avoid. Contact your dietitian for more information.  This information is not intended to replace advice given to you by your health care provider. Make sure you discuss any questions you have  with your health care provider. Document Released: 10/16/2003 Document Revised: 06/05/2015 Document Reviewed: 11/01/2012 Elsevier Interactive Patient Education  2017 [place] DASH stands for "Dietary Approaches to Stop Hypertension." The DASH eating plan is a healthy eating plan that has been shown to reduce high blood pressure (hypertension). Additional health benefits may include reducing the risk of type 2 diabetes mellitus, heart disease, and stroke. The DASH eating plan may also help with weight loss. What do I need to know about the DASH eating plan? For the DASH eating plan, you will follow these general guidelines:  Choose foods with less than 150 milligrams of sodium per serving (as listed on the food label).  Use salt-free seasonings or herbs instead of table salt or sea salt.  Check with your health care provider or pharmacist before using salt substitutes.  Eat lower-sodium products. These are often labeled as "low-sodium" or "no salt added."  Eat fresh foods. Avoid eating a lot of canned foods.  Eat more vegetables, fruits, and low-fat dairy products.  Choose whole grains. Look for the word "whole" as the first word in the ingredient list.  Choose fish and skinless chicken or [place] more often than red meat. Limit fish, poultry, and meat to 6 oz (170 g) each day.  Limit sweets, desserts, sugars, and sugary drinks.  Choose heart-healthy fats.  Eat more home-cooked food and less restaurant, buffet, and fast food.  Limit fried foods.  Do not fry foods. Cook foods using methods such as baking, boiling, grilling, and broiling instead.  When eating at a restaurant, ask that your food be prepared with less salt, or no salt if possible. What foods can I eat? Seek help from a dietitian for individual calorie needs. Grains  Whole grain or whole wheat bread. Brown rice. Whole grain or whole wheat pasta. Quinoa, bulgur, and whole grain cereals.  Low-sodium cereals. Corn or whole wheat flour tortillas. Whole grain cornbread. Whole grain crackers. Low-sodium crackers. Vegetables  Fresh or frozen vegetables (raw, steamed, roasted, or grilled). Low-sodium or reduced-sodium tomato and vegetable juices. Low-sodium or reduced-sodium tomato sauce and paste. Low-sodium or reduced-sodium canned vegetables. Fruits  All fresh, canned (in natural juice), or frozen fruits. Meat and Other Protein Products  Ground beef (85% or leaner), grass-fed beef, or beef trimmed of fat. Skinless chicken or [place]. Ground chicken or [place]. Pork trimmed of fat. All fish and seafood. Eggs. Dried beans, peas, or lentils. Unsalted nuts and seeds. Unsalted canned beans. Dairy  Low-fat dairy products, such as skim or 1% milk, 2% or reduced-fat cheeses, low-fat ricotta or cottage cheese, or plain low-fat yogurt. Low-sodium or reduced-sodium cheeses. Fats and Oils  Tub margarines without trans fats. Light or reduced-fat mayonnaise and salad dressings (reduced sodium). Avocado. Safflower, olive, or canola oils. Natural peanut or almond butter. Other  Unsalted popcorn and pretzels. The items listed above may not be a complete list of recommended foods or beverages. Contact your dietitian for more options.  What foods are not recommended? Grains  White bread. White pasta. White rice. Refined cornbread. Bagels and croissants. Crackers that contain trans fat. Vegetables  Creamed or fried vegetables. Vegetables in a cheese sauce. Regular canned vegetables. Regular canned tomato sauce and paste. Regular tomato and vegetable juices.  Fruits  Canned fruit in light or heavy syrup. Fruit juice. Meat and Other Protein Products  Fatty cuts of meat. Ribs, chicken wings, bacon, sausage, bologna, salami, chitterlings, fatback, hot dogs, bratwurst, and packaged luncheon meats. Salted nuts and seeds. Canned beans with salt. Dairy  Whole or 2% milk, cream, half-and-half, and cream cheese.  Whole-fat or sweetened yogurt. Full-fat cheeses or blue cheese. Nondairy creamers and whipped toppings. Processed cheese, cheese spreads, or cheese curds. Condiments  Onion and garlic salt, seasoned salt, table salt, and sea salt. Canned and packaged gravies. Worcestershire sauce. Tartar sauce. Barbecue sauce. Teriyaki sauce. Soy sauce, including reduced sodium. Steak sauce. Fish sauce. Oyster sauce. Cocktail sauce. Horseradish. Ketchup and mustard. Meat flavorings and tenderizers. Bouillon cubes. Hot sauce. Tabasco sauce. Marinades. Taco seasonings. Relishes. Fats and Oils  Butter, stick margarine, lard, shortening, ghee, and bacon fat. Coconut, palm kernel, or palm oils. Regular salad dressings. Other  Pickles and olives. Salted popcorn and pretzels. The items listed above may not be a complete list of foods and beverages to avoid. Contact your dietitian for more information.  Where can I find more information? National Heart, Lung, and Blood Institute: travelstabloid.com This information is not intended to replace advice given to you by your health care provider. Make sure you discuss any questions you have with your health care provider. Document Released: 12/17/2010 Document Revised: 06/05/2015 Document Reviewed: 11/01/2012 Elsevier Interactive Patient Education  2017 [employer].

## 2015-11-27 NOTE — Progress Notes (Signed)
11/27/2015 [name] 1960-09-28 [id number]   HPI:  [name] is a 55 y.o. female patient of Dr Gwenlyn Found, who presents today for a lipid clinic evaluation.  She saw Kerin Ransom on October 25 and he notes that she was to start on rosuvastatin 5 mg twice weekly, however she does not recall agreeing to this.  She notes that ezetimbe casued myalgias, but she was taking rosuvastatin at the same time.    Current Medications: none  Risk Factors: heart cath in 2010, 2013 and 2014;   Cholesterol Goals: LDL < 70   Intolerant/previously tried:  rosuvastatin 5 mg twice weekly - caused myalgias;  simvastatin also caused myalgias (was tried prior to our EMR)  Niacin causes flushing  Family history:   Father died from stroke at time of CABG (age 88)  Mother still living, had stent placed last year  Brother had stent placed at age 74, doing well   Diet:    Does not eat fried foods, but otherwise fairly poor diet, eats a lot of crackers and mac & cheese.  Drinks 2+ 20 oz caffeinated sodas per day (more on weekends); has cut back on eating out.    Exercise:    none Labs:  10/2015  -  TC 173, TG 312, HDL 22, LDL 89  (no medication) 12/2014  -  TC 197, TG 342, HDL 27, LDL 102 (no medication)  Current Outpatient Prescriptions  Medication Sig Dispense Refill  . amLODipine (NORVASC) 10 MG tablet Take 1 tablet (10 mg total) by mouth daily. 90 tablet 3  . aspirin 81 MG tablet Take 81 mg by mouth at bedtime.    Marland Kitchen ezetimibe (ZETIA) 10 MG tablet Take 1 tablet (10 mg total) by mouth daily. 30 tablet 6  . hydrALAZINE (APRESOLINE) 50 MG tablet Take 1 tablet (50 mg total) by mouth 2 (two) times daily. 180 tablet 3  . isosorbide mononitrate (IMDUR) 30 MG 24 hr tablet Take 1 tablet (30 mg total) by mouth daily. 90 tablet 3  . lisinopril (PRINIVIL,ZESTRIL) 20 MG tablet Take 1 tablet (20 mg total) by mouth 2 (two) times daily. 180 tablet 3  . metoprolol (LOPRESSOR) 50 MG tablet Take 1.5 tablets (75 mg  total) by mouth 2 (two) times daily. 270 tablet 3  . omega-3 acid ethyl esters (LOVAZA) 1 g capsule Take 2 capsules (2 g total) by mouth 2 (two) times daily. 120 capsule 6  . triamterene-hydrochlorothiazide (MAXZIDE-25) 37.5-25 MG tablet Take 1 tablet by mouth daily. 90 tablet 3   No current facility-administered medications for this visit.     Allergies  Allergen Reactions  . Bupropion     Other reaction(s): Other Suicidal thoughts and increased depression  . Rosuvastatin Other (See Comments)    myalgia  . Simvastatin     myalgias  . Wellbutrin [Bupropion Hcl]     unknown  . Zetia [Ezetimibe]     myalgias    Past Medical History:  Diagnosis Date  . Angina   . CAD (coronary artery disease), non obstructive CAD in 2010    a. 2013 Cath: LAD 60, RCA 86m, 70d; b. 2014 Cath: stable anatomy.  . Diabetes mellitus without complication ([place])    a. 12/2014 HbA1c 6.5; b. Rx Glipizide - not taking.  . Family history of premature CAD   . Hyperlipidemia   . Hypertension   . Hypertensive heart disease   . Palpitations   . Reflux   . Tobacco abuse  There were no vitals taken for this visit.   Dyslipidemia- low HDL Patient with mixed hyperlipidemia (low HDL, high TG, LDL) who has developed myalgias with both simvastatin and rosuvastatin.  We had a long discussion about diet and exercise.  She was encouraged to cut sodas to no more that 1 per day for a month then try to switch entirely to water or flavored waters.  She was also encouraged to eat more nuts and was given information about both low TG and DASH eating plans.  She will try walking with a co-worker daily, the lady does 2 laps of their parking lot each day (about a mile total).  Will start ezetimibe and lovaza now, patient aware to call if either is cost prohibitive, as we may be able to get better pricing from [facility] in [place].  Will repeat labs in late January and see her following that.     Tommy Medal PharmD  [facility]

## 2015-12-07 ENCOUNTER — Other Ambulatory Visit: Payer: Self-pay | Admitting: Cardiovascular Disease

## 2015-12-08 NOTE — Telephone Encounter (Signed)
REFILL 

## 2015-12-17 DIAGNOSIS — Z9189 Other specified personal risk factors, not elsewhere classified: Secondary | ICD-10-CM | POA: Insufficient documentation

## 2016-01-09 ENCOUNTER — Other Ambulatory Visit: Payer: Self-pay

## 2016-01-09 MED ORDER — ISOSORBIDE MONONITRATE ER 30 MG PO TB24: 30.0000 mg | ORAL_TABLET | Freq: Every day | ORAL | 3 refills | Status: DC

## 2016-01-13 ENCOUNTER — Other Ambulatory Visit: Payer: Self-pay

## 2016-01-13 MED ORDER — LISINOPRIL 20 MG PO TABS: 20.0000 mg | ORAL_TABLET | Freq: Two times a day (BID) | ORAL | 3 refills | Status: DC

## 2016-01-19 ENCOUNTER — Other Ambulatory Visit: Payer: Self-pay | Admitting: *Deleted

## 2016-01-19 MED ORDER — LISINOPRIL 40 MG PO TABS: 40.0000 mg | ORAL_TABLET | Freq: Every day | ORAL | 3 refills | Status: DC

## 2016-01-22 ENCOUNTER — Other Ambulatory Visit: Payer: Self-pay

## 2016-01-22 MED ORDER — OMEGA-3-ACID ETHYL ESTERS 1 G PO CAPS: 2.0000 g | ORAL_CAPSULE | Freq: Two times a day (BID) | ORAL | 2 refills | Status: DC

## 2016-01-22 MED ORDER — EZETIMIBE 10 MG PO TABS: 10.0000 mg | ORAL_TABLET | Freq: Every day | ORAL | 2 refills | Status: DC

## 2016-01-28 ENCOUNTER — Telehealth: Payer: Self-pay | Admitting: Pharmacist Clinician (PhC)/ Clinical Pharmacy Specialist

## 2016-01-28 DIAGNOSIS — E785 Hyperlipidemia, unspecified: Secondary | ICD-10-CM

## 2016-02-03 NOTE — Telephone Encounter (Signed)
Lab order mailed to patient.

## 2016-02-10 ENCOUNTER — Other Ambulatory Visit: Payer: Self-pay

## 2016-02-10 MED ORDER — EZETIMIBE 10 MG PO TABS: 10.0000 mg | ORAL_TABLET | Freq: Every day | ORAL | 2 refills | Status: DC

## 2016-02-10 MED ORDER — OMEGA-3-ACID ETHYL ESTERS 1 G PO CAPS: 2.0000 g | ORAL_CAPSULE | Freq: Two times a day (BID) | ORAL | 2 refills | Status: DC

## 2016-02-18 ENCOUNTER — Ambulatory Visit (INDEPENDENT_AMBULATORY_CARE_PROVIDER_SITE_OTHER): Payer: Managed Care, Other (non HMO) | Admitting: Physician Assistant

## 2016-02-18 VITALS — BP 142/86 | HR 66 | Temp 98.5°F | Resp 17 | Ht 67.0 in | Wt 223.0 lb

## 2016-02-18 DIAGNOSIS — J329 Chronic sinusitis, unspecified: Secondary | ICD-10-CM

## 2016-02-18 MED ORDER — FLUTICASONE PROPIONATE 50 MCG/ACT NA SUSP: 2.0000 | Freq: Every day | NASAL | 1 refills | Status: DC

## 2016-02-18 MED ORDER — CETIRIZINE HCL 10 MG PO TABS: 10.0000 mg | ORAL_TABLET | Freq: Every day | ORAL | 1 refills | Status: DC

## 2016-02-18 MED ORDER — GUAIFENESIN ER 1200 MG PO TB12: 1.0000 | ORAL_TABLET | Freq: Two times a day (BID) | ORAL | 1 refills | Status: DC | PRN

## 2016-02-18 NOTE — Patient Instructions (Addendum)
Sinusitis, Adult Sinusitis is soreness and inflammation of your sinuses. Sinuses are hollow spaces in the bones around your face. They are located:  Around your eyes.  In the middle of your forehead.  Behind your nose.  In your cheekbones. Your sinuses and nasal passages are lined with a stringy fluid (mucus). Mucus normally drains out of your sinuses. When your nasal tissues get inflamed or swollen, the mucus can get trapped or blocked so air cannot flow through your sinuses. This lets bacteria, viruses, and funguses grow, and that leads to infection. Follow these instructions at home: Medicines  Take, use, or apply over-the-counter and prescription medicines only as told by your doctor. These may include nasal sprays.  If you were prescribed an antibiotic medicine, take it as told by your doctor. Do not stop taking the antibiotic even if you start to feel better. Hydrate and Humidify  Drink enough water to keep your pee (urine) clear or pale yellow.  Use a cool mist humidifier to keep the humidity level in your home above 50%.  Breathe in steam for 10-15 minutes, 3-4 times a day or as told by your doctor. You can do this in the bathroom while a hot shower is running.  Try not to spend time in cool or dry air. Rest  Rest as much as possible.  Sleep with your head raised (elevated).  Make sure to get enough sleep each night. General instructions  Put a warm, moist washcloth on your face 3-4 times a day or as told by your doctor. This will help with discomfort.  Wash your hands often with soap and water. If there is no soap and water, use hand sanitizer.  Do not smoke. Avoid being around people who are smoking (secondhand smoke).  Keep all follow-up visits as told by your doctor. This is important. Contact a doctor if:  You have a fever.  Your symptoms get worse.  Your symptoms do not get better within 10 days. Get help right away if:  You have a very bad  headache.  You cannot stop throwing up (vomiting).  You have pain or swelling around your face or eyes.  You have trouble seeing.  You feel confused.  Your neck is stiff.  You have trouble breathing. This information is not intended to replace advice given to you by your health care provider. Make sure you discuss any questions you have with your health care provider. Document Released: 06/16/2007 Document Revised: 08/24/2015 Document Reviewed: 10/23/2014 Elsevier Interactive Patient Education  2017 [employer].     IF you received an x-ray today, you will receive an invoice from [facility]. Please contact [facility] at [phone] with questions or concerns regarding your invoice.   IF you received labwork today, you will receive an invoice from [facility]. Please contact [facility] at [phone] with questions or concerns regarding your invoice.   Our billing staff will not be able to assist you with questions regarding bills from these companies.  You will be contacted with the lab results as soon as they are available. The fastest way to get your results is to activate your My Chart account. Instructions are located on the last page of this paperwork. If you have not heard from [place] regarding the results in 2 weeks, please contact this office.     We recommend that you schedule a mammogram for breast cancer screening. Typically, you do not need a referral to do this. Please contact a local imaging center to  schedule your mammogram.  [facility] - [phone]  *ask for the Radiology Department [facility] - [phone] or [phone]  [facility] - [phone] [place] [phone] [facility] - [phone]  *ask for the [facility] - [phone]  *ask for the Radiology Department [facility] - [phone]  *ask for the  [place] - [phone]

## 2016-02-18 NOTE — Progress Notes (Signed)
[facility] [address], [place] [address] [phone]  Date:  02/18/2016   Name:  [name]   DOB:  Jan 21, 1960   MRN:  [id number]  PCP:  No PCP Per Patient    History of Present Illness:  [name] is a 56 y.o. female patient who presents to [facility] for cc of nasal congestion and sinus pressure.  3 days of stuffy nose, coughing and sneezing.  Head congestion.  At night, she is propped up as she is having wheezing, and can . No fever.  No facial pain.  She is coughing at night, non-productive.  Sneezing apparent.  Rhinorrhea.  The right ear hyas a popping sensation.    She is taking allergy medicine which helps 2 per day  Chief Complaint  Patient presents with  . Nasal Congestion  . Sinusitis      Patient Active Problem List   Diagnosis Date Noted  . Hypertensive heart disease   . Type 2 diabetes, controlled, with renal manifestation ([place])   . Hematoma of groin - right, s/p cath; stable on discharge 09/02/2012  . Pleuritic chest pain 08/31/2012  . Obesity (BMI 30-39.9) 08/31/2012  . Renal cyst- on CT in 2002 08/31/2012  . CAD (coronary artery disease) 04/15/2011  . Abnormal stress test, Lexiscan myoview 03/25/2011-sm. anterior ischemia 04/15/2011  . Essential hypertension 04/15/2011  . Dyslipidemia- low HDL 04/15/2011  . Metabolic syndrome 54/27/0623  . Tobacco abuse 04/15/2011  . Family history of premature CAD 04/15/2011    Past Medical History:  Diagnosis Date  . Angina   . CAD (coronary artery disease), non obstructive CAD in 2010    a. 2013 Cath: LAD 60, RCA 34m, 70d; b. 2014 Cath: stable anatomy.  . Diabetes mellitus without complication ([place])    a. 12/2014 HbA1c 6.5; b. Rx Glipizide - not taking.  . Family history of premature CAD   . Hyperlipidemia   . Hypertension   . Hypertensive heart disease   . Palpitations   . Reflux   . Tobacco abuse     Past Surgical History:  Procedure Laterality Date  . APPENDECTOMY  1974   . CARDIAC CATHETERIZATION  02/07/2008   Recommendation-F/U stress test  . CARDIAC CATHETERIZATION  04/16/2011   Recommendation-Increase medical therapy trial  . CARDIOVASCULAR STRESS TEST  03/25/2011   Suspect subtle anterior septal ischemia  . FRACTURE SURGERY    . LEFT HEART CATHETERIZATION WITH CORONARY ANGIOGRAM N/A 04/16/2011   Procedure: LEFT HEART CATHETERIZATION WITH CORONARY ANGIOGRAM;  Surgeon: Troy Sine, MD;  Location: [facility] CATH LAB;  Service: Cardiovascular;  Laterality: N/A;  . LEFT HEART CATHETERIZATION WITH CORONARY ANGIOGRAM N/A 09/01/2012   Procedure: LEFT HEART CATHETERIZATION WITH CORONARY ANGIOGRAM;  Surgeon: Pixie Casino, MD;  Location: [facility] CATH LAB;  Service: Cardiovascular;  Laterality: N/A;  . RENAL DOPPLER  06/05/2009   No evidence of significant diameter reduction  . TRANSTHORACIC ECHOCARDIOGRAM  01/25/2008   EF 60-65%, Moderate concentric LVH  . WRIST FRACTURE SURGERY Right ~ 1996    Social History  Substance Use Topics  . Smoking status: Current Every Day Smoker    Packs/day: 0.50    Years: 30.00    Types: Cigarettes  . Smokeless tobacco: Never Used     Comment: 4-5 cigarettes per day  . Alcohol use No    Family History  Problem Relation Age of Onset  . Stroke Father   . Hypertension Father   . Coronary  artery disease Brother   . Stroke Maternal Grandmother   . Cancer Maternal Grandmother     Breast cancer  . Hypertension Maternal Grandfather   . Heart attack Maternal Grandfather   . Heart attack Maternal Aunt 41    Allergies  Allergen Reactions  . Bupropion     Other reaction(s): Other Suicidal thoughts and increased depression  . Rosuvastatin Other (See Comments)    myalgia  . Simvastatin     myalgias  . Wellbutrin [Bupropion Hcl]     unknown  . Zetia [Ezetimibe]     myalgias    Medication list has been reviewed and updated.  Current Outpatient Prescriptions on File Prior to Visit  Medication Sig Dispense Refill  . amLODipine  (NORVASC) 10 MG tablet Take 1 tablet (10 mg total) by mouth daily. 90 tablet 3  . aspirin 81 MG tablet Take 81 mg by mouth at bedtime.    Marland Kitchen ezetimibe (ZETIA) 10 MG tablet Take 1 tablet (10 mg total) by mouth daily. 90 tablet 2  . hydrALAZINE (APRESOLINE) 50 MG tablet Take 1 tablet (50 mg total) by mouth 2 (two) times daily. 180 tablet 3  . isosorbide mononitrate (IMDUR) 30 MG 24 hr tablet Take 1 tablet (30 mg total) by mouth daily. 90 tablet 3  . lisinopril (PRINIVIL,ZESTRIL) 40 MG tablet Take 1 tablet (40 mg total) by mouth daily. 90 tablet 3  . metoprolol (LOPRESSOR) 50 MG tablet Take 1.5 tablets (75 mg total) by mouth 2 (two) times daily. 270 tablet 3  . omega-3 acid ethyl esters (LOVAZA) 1 g capsule Take 2 capsules (2 g total) by mouth 2 (two) times daily. 360 capsule 2  . triamterene-hydrochlorothiazide (MAXZIDE-25) 37.5-25 MG tablet TAKE 1 TABLET BY MOUTH EVERY DAY 30 tablet 11   No current facility-administered medications on file prior to visit.     ROS ROS otherwise unremarkable unless listed above.   Physical Examination: BP (!) 142/86 (BP Location: Right Arm, Patient Position: Sitting, Cuff Size: Normal)   Pulse 66   Temp 98.5 F (36.9 C) (Oral)   Resp 17   Ht 5\' 7"  (1.702 m)   Wt 223 lb (101.2 kg)   SpO2 95%   BMI 34.93 kg/m  Ideal Body Weight: Weight in (lb) to have BMI = 25: 159.3  Physical Exam  Constitutional: She is oriented to person, place, and time. She appears well-developed and well-nourished. No distress.  HENT:  Head: Normocephalic and atraumatic.  Right Ear: Tympanic membrane, external ear and ear canal normal.  Left Ear: Tympanic membrane, external ear and ear canal normal.  Nose: Mucosal edema and rhinorrhea present. Right sinus exhibits no maxillary sinus tenderness and no frontal sinus tenderness. Left sinus exhibits no maxillary sinus tenderness and no frontal sinus tenderness.  Mouth/Throat: No uvula swelling. No oropharyngeal exudate, posterior  oropharyngeal edema or posterior oropharyngeal erythema.  Eyes: Conjunctivae and EOM are normal. Pupils are equal, round, and reactive to light.  Cardiovascular: Normal rate and regular rhythm.  Exam reveals no gallop, no distant heart sounds and no friction rub.   No murmur heard. Pulmonary/Chest: Effort normal. No respiratory distress. She has no decreased breath sounds. She has no wheezes. She has no rhonchi.  Lymphadenopathy:       Head (right side): No submandibular, no tonsillar, no preauricular and no posterior auricular adenopathy present.       Head (left side): No submandibular, no tonsillar, no preauricular and no posterior auricular adenopathy present.  Neurological: She is  alert and oriented to person, place, and time.  Skin: She is not diaphoretic.  Psychiatric: She has a normal mood and affect. Her behavior is normal.     Assessment and Plan: [name] is a 56 y.o. female who is here today for cc of nasal congestion and sinus pressure. Likely viral sinus infection.  Treating supportively.  She will let me know if there is no improvement of her symptoms. Rhinosinusitis - Plan: fluticasone (FLONASE) 50 MCG/ACT nasal spray, cetirizine (ZYRTEC) 10 MG tablet, Guaifenesin (MUCINEX MAXIMUM STRENGTH) 1200 MG TB12  Ivar Drape, PA-C [facility] [place] 2/11/20187:09 PM

## 2016-03-11 ENCOUNTER — Other Ambulatory Visit: Payer: Self-pay | Admitting: Cardiovascular Disease

## 2016-03-12 ENCOUNTER — Telehealth: Payer: Self-pay | Admitting: Cardiovascular Disease

## 2016-03-12 ENCOUNTER — Other Ambulatory Visit: Payer: Self-pay

## 2016-03-12 MED ORDER — AMLODIPINE BESYLATE 10 MG PO TABS
10.0000 mg | ORAL_TABLET | Freq: Every day | ORAL | 0 refills | Status: DC
Start: 2016-03-12 — End: 2016-03-18

## 2016-03-12 MED ORDER — AMLODIPINE BESYLATE 10 MG PO TABS: 10.0000 mg | ORAL_TABLET | Freq: Every day | ORAL | 0 refills | Status: DC

## 2016-03-12 NOTE — Telephone Encounter (Signed)
Received call from patient stating she needs amlodipine refilled.Advised she is past due to see Dr.Berry.Appointment scheduled with Kerin Ransom PA 03/18/16 at 3:30 pm.Amlodipine refill sent to pharmacy.

## 2016-03-12 NOTE — Telephone Encounter (Signed)
Returned call to patient no answer.LMTC. 

## 2016-03-12 NOTE — Telephone Encounter (Signed)
New message       Pt c/o medication issue:  1. Name of Medication: amlodipine  2. How are you currently taking this medication (dosage and times per day)? 10mg  3. Are you having a reaction (difficulty breathing--STAT)? no  4. What is your medication issue? Pt states that she was denied refills on her amlodipine by our office.  Does this mean the doctor is taking her off of the amlodipine? Please call

## 2016-03-18 ENCOUNTER — Ambulatory Visit (INDEPENDENT_AMBULATORY_CARE_PROVIDER_SITE_OTHER): Payer: Managed Care, Other (non HMO) | Admitting: Cardiology

## 2016-03-18 ENCOUNTER — Encounter: Payer: Self-pay | Admitting: Cardiology

## 2016-03-18 VITALS — BP 169/80 | HR 63 | Ht 67.0 in | Wt 220.0 lb

## 2016-03-18 DIAGNOSIS — N182 Chronic kidney disease, stage 2 (mild): Secondary | ICD-10-CM

## 2016-03-18 DIAGNOSIS — R0683 Snoring: Secondary | ICD-10-CM

## 2016-03-18 DIAGNOSIS — E669 Obesity, unspecified: Secondary | ICD-10-CM

## 2016-03-18 DIAGNOSIS — R400 Somnolence: Secondary | ICD-10-CM

## 2016-03-18 DIAGNOSIS — N184 Chronic kidney disease, stage 4 (severe): Secondary | ICD-10-CM | POA: Insufficient documentation

## 2016-03-18 DIAGNOSIS — N183 Chronic kidney disease, stage 3 (moderate): Secondary | ICD-10-CM

## 2016-03-18 DIAGNOSIS — E785 Hyperlipidemia, unspecified: Secondary | ICD-10-CM | POA: Diagnosis not present

## 2016-03-18 DIAGNOSIS — Z720 Tobacco use: Secondary | ICD-10-CM | POA: Diagnosis not present

## 2016-03-18 DIAGNOSIS — G4730 Sleep apnea, unspecified: Secondary | ICD-10-CM

## 2016-03-18 DIAGNOSIS — I2510 Atherosclerotic heart disease of native coronary artery without angina pectoris: Secondary | ICD-10-CM

## 2016-03-18 DIAGNOSIS — I10 Essential (primary) hypertension: Secondary | ICD-10-CM

## 2016-03-18 DIAGNOSIS — I119 Hypertensive heart disease without heart failure: Secondary | ICD-10-CM

## 2016-03-18 DIAGNOSIS — I509 Heart failure, unspecified: Secondary | ICD-10-CM

## 2016-03-18 DIAGNOSIS — Z79899 Other long term (current) drug therapy: Secondary | ICD-10-CM

## 2016-03-18 MED ORDER — AMLODIPINE BESYLATE 10 MG PO TABS: 10.0000 mg | ORAL_TABLET | Freq: Every day | ORAL | 3 refills | Status: DC

## 2016-03-18 MED ORDER — HYDRALAZINE HCL 50 MG PO TABS: 50.0000 mg | ORAL_TABLET | Freq: Three times a day (TID) | ORAL | 3 refills | Status: DC

## 2016-03-18 NOTE — Patient Instructions (Signed)
Medication Instructions: Kerin Ransom, PA-C, has recommended making the following medication changes: 1. INCREASE Hydralazine to 25 mg THREE times daily  Labwork: Your physician recommends that you return for lab work in 1-2 weeks - FASTING.  Testing/Procedures: 1. Sleep Study - Your physician has recommended that you have a sleep study. This test records several body functions during sleep, including: brain activity, eye movement, oxygen and carbon dioxide blood levels, heart rate and rhythm, breathing rate and rhythm, the flow of air through your mouth and nose, snoring, body muscle movements, and chest and belly movement. This will be performed at [facility].  Follow-up: Your physician recommends that you schedule an appointment in 2-3 weeks with one of our clinical pharmacists for blood pressure and cholesterol management.  Lurena Joiner recommends that you schedule a follow-up appointment in 6 months with Dr Gwenlyn Found. You will receive a reminder letter in the mail two months in advance. If you don't receive a letter, please call our office to schedule the follow-up appointment.  If you need a refill on your cardiac medications before your next appointment, please call your pharmacy.

## 2016-03-18 NOTE — Assessment & Plan Note (Signed)
Not controlled

## 2016-03-18 NOTE — Assessment & Plan Note (Signed)
Etiology not yet determined. She is improved but still volume overloaded

## 2016-03-18 NOTE — Assessment & Plan Note (Signed)
On Glucophage 

## 2016-03-18 NOTE — Assessment & Plan Note (Signed)
She is trying vaping to quit cigarrettes

## 2016-03-18 NOTE — Assessment & Plan Note (Addendum)
Cath 2010, 2013, and Aug 2014 after abnormal Myoview- moderate disease- 60% mRCA, no significant LAD or CFX disease. No recent angina

## 2016-03-18 NOTE — Assessment & Plan Note (Signed)
Moderate LVH with normal LVF by echo 2010

## 2016-03-18 NOTE — Progress Notes (Signed)
03/18/2016 [name]   12/15/1960  [id number]  Primary Physician No PCP Per Patient Primary Cardiologist: Dr Gwenlyn Found  HPI:  56 y.o. obese Caucasian female, no Primary Care provider, with a past medical history significant for CAD. She has been cathed 3 times- 2010, 2013, and Aug 2014. Cath done 09/01/12 showed 60% RCA narrowing. 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 She is in the office for routine follow up. She had quit smoking but started back-" I have a 54 y/o" and it helps with the stress. She is now going to try vaping to get off cigarettes. She works in a Engineer, [employer] and has taken a second job at [employer]. . She and her co-workers try and walk during they're breaks. She denies any anginal sounding chest pain. She is concerned about her wgt and her B/P. She has lost 11 lbs since her LOV. She is in the office because she couldn't get a refill on her medications.    Current Outpatient Prescriptions  Medication Sig Dispense Refill  . amLODipine (NORVASC) 10 MG tablet Take 1 tablet (10 mg total) by mouth daily. 90 tablet 3  . aspirin 81 MG tablet Take 81 mg by mouth at bedtime.    . cetirizine (ZYRTEC) 10 MG tablet Take 1 tablet (10 mg total) by mouth daily. 30 tablet 1  . ezetimibe (ZETIA) 10 MG tablet Take 1 tablet (10 mg total) by mouth daily. 90 tablet 2  . fluticasone (FLONASE) 50 MCG/ACT nasal spray Place 2 sprays into both nostrils daily. 16 g 1  . Guaifenesin (MUCINEX MAXIMUM STRENGTH) 1200 MG TB12 Take 1 tablet (1,200 mg total) by mouth every 12 (twelve) hours as needed. 14 tablet 1  . hydrALAZINE (APRESOLINE) 50 MG tablet Take 1 tablet (50 mg total) by mouth 3 (three) times daily. 270 tablet 3  . isosorbide mononitrate (IMDUR) 30 MG 24 hr tablet Take 1 tablet (30 mg total) by mouth daily. 90 tablet 3  . lisinopril (PRINIVIL,ZESTRIL) 40 MG tablet Take 1 tablet (40 mg total) by mouth daily. 90 tablet  3  . metoprolol (LOPRESSOR) 50 MG tablet Take 1.5 tablets (75 mg total) by mouth 2 (two) times daily. 270 tablet 3  . omega-3 acid ethyl esters (LOVAZA) 1 g capsule Take 2 capsules (2 g total) by mouth 2 (two) times daily. 360 capsule 2  . triamterene-hydrochlorothiazide (MAXZIDE-25) 37.5-25 MG tablet TAKE 1 TABLET BY MOUTH EVERY DAY 30 tablet 11   No current facility-administered medications for this visit.     Allergies  Allergen Reactions  . Bupropion     Other reaction(s): Other Suicidal thoughts and increased depression  . Rosuvastatin Other (See Comments)    myalgia  . Simvastatin     myalgias  . Wellbutrin [Bupropion Hcl]     unknown  . Zetia [Ezetimibe]     myalgias    Past Medical History:  Diagnosis Date  . Angina   . CAD (coronary artery disease), non obstructive CAD in 2010    a. 2013 Cath: LAD 60, RCA 6m, 70d; b. 2014 Cath: stable anatomy.  . Diabetes mellitus without complication ([place])    a. 12/2014 HbA1c 6.5; b. Rx Glipizide - not taking.  . Family history of premature CAD   . Hyperlipidemia   . Hypertension   . Hypertensive heart disease   . Palpitations   . Reflux   . Tobacco abuse     Social  History   Social History  . Marital status: Divorced    Spouse name: N/A  . Number of children: N/A  . Years of education: N/A   Occupational History  . Not on file.   Social History Main Topics  . Smoking status: Current Every Day Smoker    Packs/day: 0.50    Years: 30.00    Types: Cigarettes  . Smokeless tobacco: Never Used     Comment: 4-5 cigarettes per day  . Alcohol use No  . Drug use: No  . Sexual activity: Not Currently   Other Topics Concern  . Not on file   Social History Narrative  . No narrative on file     Family History  Problem Relation Age of Onset  . Stroke Father   . Hypertension Father   . Coronary artery disease Brother   . Stroke Maternal Grandmother   . Cancer Maternal Grandmother     Breast cancer  . Hypertension  Maternal Grandfather   . Heart attack Maternal Grandfather   . Heart attack Maternal Aunt 41     Review of Systems: General: negative for chills, fever, night sweats or weight changes.  Cardiovascular: negative for chest pain, dyspnea on exertion, edema, orthopnea, palpitations, paroxysmal nocturnal dyspnea or shortness of breath Dermatological: negative for rash Respiratory: negative for cough or wheezing Urologic: negative for hematuria Abdominal: negative for nausea, vomiting, diarrhea, bright red blood per rectum, melena, or hematemesis Neurologic: negative for visual changes, syncope, or dizziness All other systems reviewed and are otherwise negative except as noted above.    Blood pressure (!) 169/80, pulse 63, height 5\' 7"  (1.702 m), weight 220 lb (99.8 kg).  General appearance: alert, cooperative, no distress and moderately obese Neck: no carotid bruit and no JVD Lungs: clear to auscultation bilaterally Heart: regular rate and rhythm Extremities: trace edema Skin: Skin color, texture, turgor normal. No rashes or lesions Neurologic: Grossly normal  EKG NSR, lateral TWI-old  ASSESSMENT AND PLAN:   CAD (coronary artery disease) Cath 2010, 2013, and Aug 2014 after abnormal Myoview- moderate disease- 60% mRCA, no significant LAD or CFX disease. No recent angina  Obesity (BMI 30-39.9) BMI 34-wgt is down to 220 lbs from [place] in Oct when she was 231.  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 She is due to f/u with  Erasmo Downer for her lipids. I'l have her get a fasting Lipids profile and a CMET before that visit. If her B/P is still not controlled we'll need to discuss options. She probably has sleep apnea and I will arrange a sleep study.   Kerin Ransom PA-C 03/18/2016 4:48 PM

## 2016-03-18 NOTE — Assessment & Plan Note (Signed)
CRI stage 2-3

## 2016-03-18 NOTE — Assessment & Plan Note (Addendum)
BMI 34-wgt is down to 220 lbs from LOV in Oct when she was 231.

## 2016-03-18 NOTE — Assessment & Plan Note (Addendum)
Statin intolerance, due for f/u Lipids on Zetia and a CMET on ACE and diuretic

## 2016-03-18 NOTE — Assessment & Plan Note (Signed)
Suspected sleep apnea with snoring, uncontrolled HTN, daytime fatigue, obesity

## 2016-04-01 LAB — LIPID PANEL
Cholesterol: 153 mg/dL (ref <200)
HDL: 22 mg/dL — ABNORMAL LOW (ref >50)
LDL Cholesterol: 79 mg/dL (ref <100)
Total CHOL/HDL Ratio: 7 Ratio — ABNORMAL HIGH (ref <5.0)
Triglycerides: 262 mg/dL — ABNORMAL HIGH (ref <150)
VLDL: 52 mg/dL — ABNORMAL HIGH (ref <30)

## 2016-04-01 LAB — COMPREHENSIVE METABOLIC PANEL
ALT: 36 U/L — ABNORMAL HIGH (ref 6–29)
AST: 30 U/L (ref 10–35)
Albumin: 3.6 g/dL (ref 3.6–5.1)
Alkaline Phosphatase: 39 U/L (ref 33–130)
BUN: 24 mg/dL (ref 7–25)
CO2: 25 mmol/L (ref 20–31)
Calcium: 8.5 mg/dL — ABNORMAL LOW (ref 8.6–10.4)
Chloride: 105 mmol/L (ref 98–110)
Creat: 1.43 mg/dL — ABNORMAL HIGH (ref 0.50–1.05)
Glucose, Bld: 135 mg/dL — ABNORMAL HIGH (ref 65–99)
Potassium: 3.8 mmol/L (ref 3.5–5.3)
Sodium: 139 mmol/L (ref 135–146)
Total Bilirubin: 0.4 mg/dL (ref 0.2–1.2)
Total Protein: 6.5 g/dL (ref 6.1–8.1)

## 2016-04-03 ENCOUNTER — Ambulatory Visit (INDEPENDENT_AMBULATORY_CARE_PROVIDER_SITE_OTHER): Payer: Managed Care, Other (non HMO) | Admitting: Physician Assistant

## 2016-04-03 VITALS — BP 148/98 | HR 72 | Temp 97.9°F | Resp 16 | Ht 67.0 in | Wt 227.6 lb

## 2016-04-03 DIAGNOSIS — Z124 Encounter for screening for malignant neoplasm of cervix: Secondary | ICD-10-CM | POA: Diagnosis not present

## 2016-04-03 DIAGNOSIS — L989 Disorder of the skin and subcutaneous tissue, unspecified: Secondary | ICD-10-CM | POA: Diagnosis not present

## 2016-04-03 DIAGNOSIS — Z0000 Encounter for general adult medical examination without abnormal findings: Secondary | ICD-10-CM | POA: Diagnosis not present

## 2016-04-03 DIAGNOSIS — Z1211 Encounter for screening for malignant neoplasm of colon: Secondary | ICD-10-CM

## 2016-04-03 DIAGNOSIS — Z23 Encounter for immunization: Secondary | ICD-10-CM

## 2016-04-03 NOTE — Patient Instructions (Addendum)
It was a pleasure meeting you today. Our office will contact you with your lab results next week. Please keep up the walking and eating broiled/baked food! Your biggest goal needs to be to add water to your diet, this will help you out tremendously! You need at least 64 oz a day but start with a little and work your way up! In terms of colonoscopy, they will contact you with the appointment. And you should hear from dermatology in the next two weeks. If you ever have any other questions, please let me know. Thank you for letting me participate in your health and well being.    Heart-Healthy Eating Plan Heart-healthy meal planning includes:  Limiting unhealthy fats.  Increasing healthy fats.  Making other small dietary changes. You may need to talk with your doctor or a diet specialist (dietitian) to create an eating plan that is right for you. What types of fat should I choose?  Choose healthy fats. These include olive oil and canola oil, flaxseeds, walnuts, almonds, and seeds.  Eat more omega-3 fats. These include salmon, mackerel, sardines, tuna, flaxseed oil, and ground flaxseeds. Try to eat fish at least twice each week.  Limit saturated fats.  Saturated fats are often found in animal products, such as meats, butter, and cream.  Plant sources of saturated fats include palm oil, palm kernel oil, and coconut oil.  Avoid foods with partially hydrogenated oils in them. These include stick margarine, some tub margarines, cookies, crackers, and other baked goods. These contain trans fats. What general guidelines do I need to follow?  Check food labels carefully. Identify foods with trans fats or high amounts of saturated fat.  Fill one half of your plate with vegetables and green salads. Eat 4-5 servings of vegetables per day. A serving of vegetables is:  1 cup of raw leafy vegetables.   cup of raw or cooked cut-up vegetables.   cup of vegetable juice.  Fill one fourth of your  plate with whole grains. Look for the word "whole" as the first word in the ingredient list.  Fill one fourth of your plate with lean protein foods.  Eat 4-5 servings of fruit per day. A serving of fruit is:  One medium whole fruit.   cup of dried fruit.   cup of fresh, frozen, or canned fruit.   cup of 100% fruit juice.  Eat more foods that contain soluble fiber. These include apples, broccoli, carrots, beans, peas, and barley. Try to get 20-30 g of fiber per day.  Eat more home-cooked food. Eat less restaurant, buffet, and fast food.  Limit or avoid alcohol.  Limit foods high in starch and sugar.  Avoid fried foods.  Avoid frying your food. Try baking, boiling, grilling, or broiling it instead. You can also reduce fat by:  Removing the skin from poultry.  Removing all visible fats from meats.  Skimming the fat off of stews, soups, and gravies before serving them.  Steaming vegetables in water or broth.  Lose weight if you are overweight.  Eat 4-5 servings of nuts, legumes, and seeds per week:  One serving of dried beans or legumes equals  cup after being cooked.  One serving of nuts equals 1 ounces.  One serving of seeds equals  ounce or one tablespoon.  You may need to keep track of how much salt or sodium you eat. This is especially true if you have high blood pressure. Talk with your doctor or dietitian to get more  information. What foods can I eat? Grains  Breads, including [place], white, pita, wheat, raisin, rye, oatmeal, and [place]. Tortillas that are neither fried nor made with lard or trans fat. Low-fat rolls, including hotdog and hamburger buns and English muffins. Biscuits. Muffins. Waffles. Pancakes. Light popcorn. Whole-grain cereals. Flatbread. Melba toast. Pretzels. Breadsticks. Rusks. Low-fat snacks. Low-fat crackers, including oyster, saltine, matzo, graham, animal, and rye. Rice and pasta, including brown rice and pastas that are made with  whole wheat. Vegetables  All vegetables. Fruits  All fruits, but limit coconut. Meats and Other Protein Sources  Lean, well-trimmed beef, veal, pork, and lamb. Chicken and [place] without skin. All fish and shellfish. Wild duck, rabbit, pheasant, and venison. Egg whites or low-cholesterol egg substitutes. Dried beans, peas, lentils, and tofu. Seeds and most nuts. Dairy  Low-fat or nonfat cheeses, including ricotta, string, and mozzarella. Skim or 1% milk that is liquid, powdered, or evaporated. Buttermilk that is made with low-fat milk. Nonfat or low-fat yogurt. Beverages  Mineral water. Diet carbonated beverages. Sweets and Desserts  Sherbets and fruit ices. Honey, jam, marmalade, jelly, and syrups. Meringues and gelatins. Pure sugar candy, such as hard candy, jelly beans, gumdrops, mints, marshmallows, and small amounts of dark chocolate. [employer] Eat all sweets and desserts in moderation. Fats and Oils  Nonhydrogenated (trans-free) margarines. Vegetable oils, including soybean, sesame, sunflower, olive, peanut, safflower, corn, canola, and cottonseed. Salad dressings or mayonnaise made with a vegetable oil. Limit added fats and oils that you use for cooking, baking, salads, and as spreads. Other  Cocoa powder. Coffee and tea. All seasonings and condiments. The items listed above may not be a complete list of recommended foods or beverages. Contact your dietitian for more options.  What foods are not recommended? Grains  Breads that are made with saturated or trans fats, oils, or whole milk. Croissants. Butter rolls. Cheese breads. Sweet rolls. Donuts. Buttered popcorn. Chow mein noodles. High-fat crackers, such as cheese or butter crackers. Meats and Other Protein Sources  Fatty meats, such as hotdogs, short ribs, sausage, spareribs, bacon, rib eye roast or steak, and mutton. High-fat deli meats, such as salami and bologna. Caviar. Domestic duck and goose. Organ meats, such as  kidney, liver, sweetbreads, and heart. Dairy  Cream, sour cream, cream cheese, and creamed cottage cheese. Whole-milk cheeses, including blue (bleu), Monterey Jack, [place], [place], American, [place], Swiss, cheddar, [place], and [place]. Whole or 2% milk that is liquid, evaporated, or condensed. Whole buttermilk. Cream sauce or high-fat cheese sauce. Yogurt that is made from whole milk. Beverages  Regular sodas and juice drinks with added sugar. Sweets and Desserts  Frosting. Pudding. Cookies. Cakes other than angel food cake. Candy that has milk chocolate or white chocolate, hydrogenated fat, butter, coconut, or unknown ingredients. Buttered syrups. Full-fat ice cream or ice cream drinks. Fats and Oils  Gravy that has suet, meat fat, or shortening. Cocoa butter, hydrogenated oils, palm oil, coconut oil, palm kernel oil. These can often be found in baked products, candy, fried foods, nondairy creamers, and whipped toppings. Solid fats and shortenings, including bacon fat, salt pork, lard, and butter. Nondairy cream substitutes, such as coffee creamers and sour cream substitutes. Salad dressings that are made of unknown oils, cheese, or sour cream. The items listed above may not be a complete list of foods and beverages to avoid. Contact your dietitian for more information.  This information is not intended to replace advice given to you by your health care provider. Make sure you  discuss any questions you have with your health care provider. Document Released: 06/29/2011 Document Revised: 06/05/2015 Document Reviewed: 06/21/2013 Elsevier Interactive Patient Education  2017 [employer].   IF you received an x-ray today, you will receive an invoice from [facility]. Please contact [facility] at [phone] with questions or concerns regarding your invoice.   IF you received labwork today, you will receive an invoice from [facility]. Please contact [facility] at [phone] with  questions or concerns regarding your invoice.   Our billing staff will not be able to assist you with questions regarding bills from these companies.  You will be contacted with the lab results as soon as they are available. The fastest way to get your results is to activate your My Chart account. Instructions are located on the last page of this paperwork. If you have not heard from [place] regarding the results in 2 weeks, please contact this office.

## 2016-04-03 NOTE — Progress Notes (Signed)
[name]  MRN: [id number] DOB: 09-05-1960  Subjective:  Pt is a 56 y.o. female who presents for annual physical exam. She does not need any blood work as she just had it drawn yesterday at her cardiologist's office. She also does need any medication refills.   Social: Works in Furniture conservator/restorer. Lives at home with son. She is single. She is not sexually active.   Diet: Eating more boiled and baked meat. She has cut down on fast foods. Will eat veggies and fruits. Does not drink a lot of water. Will drink coffee and one mountain dew a day.   Exercise: Does not do structured exercise.   Menstrual cycles: Has not had a cycle in 3 years.  Bowel movements: 1-2 times a day, normal for her.   Sleep: She is not sleeping very well. Has a sleep study scheduled for 04/29/16.  Last annual exam: 2017 Last dental exam: >10 years ago Last vision exam: 2017, wears glasses  Last pap smear: >5 years  Last mammogram: 10/2015 Last colonoscopy: Never Vaccinations      Tetanus: > 10 years ago  Chronic Disorders: Her HTN, HLD, T2DM are followed by her cardiologist, Dr. Gwenlyn Found.   Patient Active Problem List   Diagnosis Date Noted  . Sleep apnea 03/18/2016  . 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 Hypertensive heart disease   . Hematoma of groin - right, s/p cath; stable on discharge 09/02/2012  . Pleuritic chest pain 08/31/2012  . Obesity (BMI 30-39.9) 08/31/2012  . Renal cyst- on CT in 2002 08/31/2012  . CAD (coronary artery disease) 04/15/2011  . Abnormal stress test, Lexiscan myoview 03/25/2011-sm. anterior ischemia 04/15/2011  . Essential hypertension 04/15/2011  . Dyslipidemia- low HDL 04/15/2011  . Metabolic syndrome 10/93/2355  . Tobacco abuse 04/15/2011  . Family history of premature CAD 04/15/2011    Current Outpatient Prescriptions on File Prior to Visit  Medication Sig Dispense Refill  . amLODipine (NORVASC) 10 MG tablet Take 1  tablet (10 mg total) by mouth daily. 90 tablet 3  . aspirin 81 MG tablet Take 81 mg by mouth at bedtime.    . cetirizine (ZYRTEC) 10 MG tablet Take 1 tablet (10 mg total) by mouth daily. 30 tablet 1  . ezetimibe (ZETIA) 10 MG tablet Take 1 tablet (10 mg total) by mouth daily. 90 tablet 2  . hydrALAZINE (APRESOLINE) 50 MG tablet Take 1 tablet (50 mg total) by mouth 3 (three) times daily. 270 tablet 3  . isosorbide mononitrate (IMDUR) 30 MG 24 hr tablet Take 1 tablet (30 mg total) by mouth daily. 90 tablet 3  . lisinopril (PRINIVIL,ZESTRIL) 40 MG tablet Take 1 tablet (40 mg total) by mouth daily. 90 tablet 3  . metoprolol (LOPRESSOR) 50 MG tablet Take 1.5 tablets (75 mg total) by mouth 2 (two) times daily. 270 tablet 3  . omega-3 acid ethyl esters (LOVAZA) 1 g capsule Take 2 capsules (2 g total) by mouth 2 (two) times daily. 360 capsule 2  . triamterene-hydrochlorothiazide (MAXZIDE-25) 37.5-25 MG tablet TAKE 1 TABLET BY MOUTH EVERY DAY 30 tablet 11  . fluticasone (FLONASE) 50 MCG/ACT nasal spray Place 2 sprays into both nostrils daily. (Patient not taking: Reported on 04/03/2016) 16 g 1  . Guaifenesin (MUCINEX MAXIMUM STRENGTH) 1200 MG TB12 Take 1 tablet (1,200 mg total) by mouth every 12 (twelve) hours as needed. (Patient not taking: Reported on 04/03/2016) 14 tablet 1   No  current facility-administered medications on file prior to visit.     Allergies  Allergen Reactions  . Bupropion     Other reaction(s): Other Suicidal thoughts and increased depression  . Rosuvastatin Other (See Comments)    myalgia  . Simvastatin     myalgias  . Wellbutrin [Bupropion Hcl]     unknown  . Zetia [Ezetimibe]     myalgias    Social History   Social History  . Marital status: Divorced    Spouse name: N/A  . Number of children: N/A  . Years of education: N/A   Social History Main Topics  . Smoking status: Current Every Day Smoker    Packs/day: 0.50    Years: 30.00    Types: Cigarettes  .  Smokeless tobacco: Never Used     Comment: 4-5 cigarettes per day  . Alcohol use No  . Drug use: No  . Sexual activity: Not Currently   Other Topics Concern  . None   Social History Narrative  . None    Past Surgical History:  Procedure Laterality Date  . APPENDECTOMY  1974  . CARDIAC CATHETERIZATION  02/07/2008   Recommendation-F/U stress test  . CARDIAC CATHETERIZATION  04/16/2011   Recommendation-Increase medical therapy trial  . CARDIOVASCULAR STRESS TEST  03/25/2011   Suspect subtle anterior septal ischemia  . FRACTURE SURGERY    . LEFT HEART CATHETERIZATION WITH CORONARY ANGIOGRAM N/A 04/16/2011   Procedure: LEFT HEART CATHETERIZATION WITH CORONARY ANGIOGRAM;  Surgeon: Troy Sine, MD;  Location: [facility] C[facility] LAB;  Service: Cardiovascular;  Laterality: N/A;  . LEFT HEART CATHETERIZATION WITH CORONARY ANGIOGRAM N/A 09/01/2012   Procedure: LEFT HEART CATHETERIZATION WITH CORONARY ANGIOGRAM;  Surgeon: Pixie Casino, MD;  Location: [facility] CATH LAB;  Service: Cardiovascular;  Laterality: N/A;  . RENAL DOPPLER  06/05/2009   No evidence of significant diameter reduction  . TRANSTHORACIC ECHOCARDIOGRAM  01/25/2008   EF 60-65%, Moderate concentric LVH  . WRIST FRACTURE SURGERY Right ~ 1996    Family History  Problem Relation Age of Onset  . Stroke Father   . Hypertension Father   . Coronary artery disease Brother   . Stroke Maternal Grandmother   . Cancer Maternal Grandmother     Breast cancer  . Hypertension Maternal Grandfather   . Heart attack Maternal Grandfather   . Heart attack Maternal Aunt 41    Review of Systems  Constitutional: Negative for activity change, appetite change, chills, diaphoresis, fatigue, fever and unexpected weight change.  HENT: Negative for congestion, dental problem, drooling, ear discharge, ear pain, facial swelling, hearing loss, mouth sores, nosebleeds, postnasal drip, rhinorrhea, sinus pain, sinus pressure, sneezing, sore throat, tinnitus, trouble  swallowing and voice change.   Eyes: Negative for photophobia, pain, discharge, redness, itching and visual disturbance.  Respiratory: Negative for apnea, cough, choking, chest tightness, shortness of breath, wheezing and stridor.   Cardiovascular: Negative for chest pain, palpitations and leg swelling.  Gastrointestinal: Negative for abdominal distention, abdominal pain, anal bleeding, blood in stool, constipation, diarrhea, nausea, rectal pain and vomiting.  Endocrine: Negative for cold intolerance, heat intolerance, polydipsia, polyphagia and polyuria.  Genitourinary: Negative for decreased urine volume, difficulty urinating, dyspareunia, dysuria, enuresis, flank pain, frequency, genital sores, hematuria, menstrual problem, pelvic pain, urgency, vaginal bleeding, vaginal discharge and vaginal pain.  Musculoskeletal: Negative for arthralgias, back pain, gait problem, joint swelling, myalgias, neck pain and neck stiffness.  Skin: Negative for color change, pallor, rash and wound.  Allergic/Immunologic: Negative for environmental allergies, food  allergies and immunocompromised state.  Neurological: Negative for dizziness, tremors, seizures, syncope, facial asymmetry, speech difficulty, weakness, light-headedness, numbness and headaches.  Hematological: Negative for adenopathy. Does not bruise/bleed easily.  Psychiatric/Behavioral: Negative for agitation, behavioral problems, confusion, decreased concentration, dysphoric mood, hallucinations, self-injury, sleep disturbance and suicidal ideas. The patient is not nervous/anxious and is not hyperactive.     Objective:  BP (!) 148/98 (BP Location: Right Arm, Patient Position: Sitting, Cuff Size: Small)   Pulse 72   Temp 97.9 F (36.6 C) (Oral)   Resp 16   Ht 5\' 7"  (1.702 m)   Wt 227 lb 9.6 oz (103.2 kg)   SpO2 97%   BMI 35.65 kg/m   Physical Exam  Constitutional: She is oriented to person, place, and time and well-developed, well-nourished, and  in no distress.  HENT:  Head: Normocephalic and atraumatic.  Right Ear: Hearing, tympanic membrane, external ear and ear canal normal.  Left Ear: Hearing, tympanic membrane, external ear and ear canal normal.  Nose: Nose normal.  Mouth/Throat: Uvula is midline and oropharynx is clear and moist. Mucous membranes are dry. No oropharyngeal exudate.  Eyes: Conjunctivae, EOM and lids are normal. Pupils are equal, round, and reactive to light. No scleral icterus.  Neck: Trachea normal and normal range of motion. No thyroid mass and no thyromegaly present.  Cardiovascular: Normal rate, regular rhythm, normal heart sounds and intact distal pulses.   Pulmonary/Chest: Effort normal and breath sounds normal.  Abdominal: Soft. Normal appearance and bowel sounds are normal. There is no tenderness.  Genitourinary: Vagina normal, uterus normal, cervix normal, right adnexa normal, left adnexa normal and vulva normal.  Lymphadenopathy:       Head (right side): No tonsillar, no preauricular, no posterior auricular and no occipital adenopathy present.       Head (left side): No tonsillar, no preauricular, no posterior auricular and no occipital adenopathy present.    She has no cervical adenopathy.       Right: No supraclavicular adenopathy present.       Left: No supraclavicular adenopathy present.  Neurological: She is alert and oriented to person, place, and time. She has normal sensation, normal strength and normal reflexes. Gait normal.  Skin: Skin is warm and dry.  Multiple waxy elevated growths on posterior trunk.   2 small grainy skin growths noted on right forearm.  1cm firm nodule on left temple.     Psychiatric: Affect normal.   No exam data present  Assessment and Plan :  Discussed healthy lifestyle, diet, exercise, preventative care, vaccinations, and addressed patient's concerns. Plan for follow up in one year. Otherwise, plan for specific conditions below. 1. Annual physical exam Await  lab results.  2. Screen for colon cancer - Ambulatory referral to Gastroenterology  3. Screening for cervical cancer - Pap IG and HPV (high risk) DNA detection  4. Need for diphtheria-tetanus-pertussis (Tdap) vaccine - Tdap vaccine greater than or equal to 7yo IM  5. Skin lesions - Ambulatory referral to Dermatology   Tenna Delaine PA-C  [facility] 04/03/2016 8:32 AM

## 2016-04-06 ENCOUNTER — Encounter: Payer: Self-pay | Admitting: Internal Medicine

## 2016-04-06 LAB — PAP IG AND HPV HIGH-RISK
HPV, high-risk: NEGATIVE
PAP SMEAR COMMENT: 0

## 2016-04-08 ENCOUNTER — Ambulatory Visit (INDEPENDENT_AMBULATORY_CARE_PROVIDER_SITE_OTHER): Payer: Managed Care, Other (non HMO) | Admitting: Pharmacist Clinician (PhC)/ Clinical Pharmacy Specialist

## 2016-04-08 ENCOUNTER — Encounter: Payer: Self-pay | Admitting: Pharmacist Clinician (PhC)/ Clinical Pharmacy Specialist

## 2016-04-08 DIAGNOSIS — I10 Essential (primary) hypertension: Secondary | ICD-10-CM | POA: Diagnosis not present

## 2016-04-08 NOTE — Assessment & Plan Note (Signed)
Patient with resistant hypertension, scheduled for sleep study in April.  Will have her increase the hydralazine for now, starting with 100 mg twice daily for 1 week then increasing to 100 mg three times daily.  Will have her return for further follow up in about 4 weeks, once the sleep study results are in.  If pressure remains elevated, may consider stopping triamterene/hctz and trying chlorthalidone.

## 2016-04-08 NOTE — Progress Notes (Signed)
04/08/2016 [name] 04-22-1960 [id number]   HPI:  [name] is a 56 y.o. female patient of Dr Gwenlyn Found, with a PMH below who presents today for hypertension clinic evaluation.  Her cardiac history is significant for CAD (60% narrowing in RCA), hypertension, obesity, metabolic syndrome, smoking, hyperlipidemia (with statin intolerance) and CKD.  We have worked with her in the past on both hypertension and hyperlipidemia.  Bhas had long-standing blood pressure problems and is currently on 6 medications.  States compliance with medication regimen.  Because of poorly controlled BP, she is scheduled for a sleep study to rule out OSA.  When she saw Lurena Joiner several weeks ago, she admitted to having stopped her lisinopril for about 2 months.  She had read something about angioedema and was afraid that she could develop this.  Lurena Joiner explained the risks and she re-started it at that time.    Blood Pressure Goal:  130/80  Current Medications:  Amlodipine 10 mg daily (hs)  Hydralazine 50 mg three times daily  Lisinopril 20 mg twice daily (stopped for 2+ months, restarted at [facility] app)  Metoprolol 75 mg twice daily  triam/hctz 37.5/25 (am)  Family Hx:  Father died from stroke at time of CABG (age 27)             Mother still living, had stent placed last year             Brother had stent placed at age 16, doing well  Social Hx:  Smoker, down to 6 cigarettes per day; no alcohol; caffeine - coffee at work (2-3 cups), soda 1 day, more on weekends  Diet:  Lots of chicken, only rarely fried foods; more vegetables, if canned drains and rinses first;  Some pastas but no breads  Exercise:  Walking 5 days per week on afternoon break - 1 mile around parking lot  Home BP readings:  Bought a wrist cuff about 1-2 months ago, when she brought it to a previous appointment it read about 50 points higher than the office cuff.   She has not used it since, as the high readings caused her increased  stress.     Wt Readings from Last 3 Encounters:  04/03/16 227 lb 9.6 oz (103.2 kg)  03/18/16 220 lb (99.8 kg)  02/18/16 223 lb (101.2 kg)   BP Readings from Last 3 Encounters:  04/08/16 (!) 172/84  04/03/16 (!) 148/98  03/18/16 (!) 169/80   Pulse Readings from Last 3 Encounters:  04/08/16 76  04/03/16 72  03/18/16 63    Current Outpatient Prescriptions  Medication Sig Dispense Refill  . amLODipine (NORVASC) 10 MG tablet Take 1 tablet (10 mg total) by mouth daily. 90 tablet 3  . aspirin 81 MG tablet Take 81 mg by mouth at bedtime.    . cetirizine (ZYRTEC) 10 MG tablet Take 1 tablet (10 mg total) by mouth daily. 30 tablet 1  . ezetimibe (ZETIA) 10 MG tablet Take 1 tablet (10 mg total) by mouth daily. 90 tablet 2  . fluticasone (FLONASE) 50 MCG/ACT nasal spray Place 2 sprays into both nostrils daily. (Patient not taking: Reported on 04/03/2016) 16 g 1  . Guaifenesin (MUCINEX MAXIMUM STRENGTH) 1200 MG TB12 Take 1 tablet (1,200 mg total) by mouth every 12 (twelve) hours as needed. (Patient not taking: Reported on 04/03/2016) 14 tablet 1  . hydrALAZINE (APRESOLINE) 50 MG tablet Take 1 tablet (50 mg total) by mouth 3 (three) times daily. 270 tablet  3  . isosorbide mononitrate (IMDUR) 30 MG 24 hr tablet Take 1 tablet (30 mg total) by mouth daily. 90 tablet 3  . lisinopril (PRINIVIL,ZESTRIL) 40 MG tablet Take 1 tablet (40 mg total) by mouth daily. 90 tablet 3  . metoprolol (LOPRESSOR) 50 MG tablet Take 1.5 tablets (75 mg total) by mouth 2 (two) times daily. 270 tablet 3  . omega-3 acid ethyl esters (LOVAZA) 1 g capsule Take 2 capsules (2 g total) by mouth 2 (two) times daily. 360 capsule 2  . triamterene-hydrochlorothiazide (MAXZIDE-25) 37.5-25 MG tablet TAKE 1 TABLET BY MOUTH EVERY DAY 30 tablet 11   No current facility-administered medications for this visit.     Allergies  Allergen Reactions  . Bupropion     Other reaction(s): Other Suicidal thoughts and increased depression  .  Rosuvastatin Other (See Comments)    myalgia  . Simvastatin     myalgias  . Wellbutrin [Bupropion Hcl]     unknown  . Zetia [Ezetimibe]     myalgias    Past Medical History:  Diagnosis Date  . Angina   . CAD (coronary artery disease), non obstructive CAD in 2010    a. 2013 Cath: LAD 60, RCA 34m, 70d; b. 2014 Cath: stable anatomy.  . Diabetes mellitus without complication ([place])    a. 12/2014 HbA1c 6.5; b. Rx Glipizide - not taking.  . Family history of premature CAD   . Hyperlipidemia   . Hypertension   . Hypertensive heart disease   . Palpitations   . Reflux   . Tobacco abuse     Blood pressure (!) 172/84, pulse 76, last menstrual period 12/14/2013.  Essential hypertension Patient with resistant hypertension, scheduled for sleep study in April.  Will have her increase the hydralazine for now, starting with 100 mg twice daily for 1 week then increasing to 100 mg three times daily.  Will have her return for further follow up in about 4 weeks, once the sleep study results are in.  If pressure remains elevated, may consider stopping triamterene/hctz and trying chlorthalidone.    Tommy Medal PharmD CPP [facility]

## 2016-04-08 NOTE — Patient Instructions (Addendum)
Return for a a follow up appointment in 4 weeks  Your blood pressure today is 172/84 (goal is < 130/80)  Take your BP meds as follows:  Increase hydralazine to 100 mg ( 2 tabs) twice daily for about 10 days, then add the mid-day 100 mg dose.  Continue all other medications  Bring all of your meds, your BP cuff and your record of home blood pressures to your next appointment.  Exercise as you're able, try to walk approximately 30 minutes per day.  Keep salt intake to a minimum, especially watch canned and prepared boxed foods.  Eat more fresh fruits and vegetables and fewer canned items.  Avoid eating in fast food restaurants.    HOW TO TAKE YOUR BLOOD PRESSURE: . Rest 5 minutes before taking your blood pressure. .  Don't smoke or drink caffeinated beverages for at least 30 minutes before. . Take your blood pressure before (not after) you eat. . Sit comfortably with your back supported and both feet on the floor (don't cross your legs). . Elevate your arm to heart level on a table or a desk. . Use the proper sized cuff. It should fit smoothly and snugly around your bare upper arm. There should be enough room to slip a fingertip under the cuff. The bottom edge of the cuff should be 1 inch above the crease of the elbow. . Ideally, take 3 measurements at one sitting and record the average.

## 2016-04-21 ENCOUNTER — Encounter: Payer: Self-pay | Admitting: Physician Assistant

## 2016-04-22 ENCOUNTER — Other Ambulatory Visit: Payer: Self-pay | Admitting: *Deleted

## 2016-04-22 DIAGNOSIS — G4733 Obstructive sleep apnea (adult) (pediatric): Secondary | ICD-10-CM

## 2016-04-23 ENCOUNTER — Telehealth: Payer: Self-pay | Admitting: Cardiology

## 2016-04-23 NOTE — Telephone Encounter (Signed)
Spoke w/pt.  [name] has denied in lab study.  Pt to have home study.  Informed pt Barry Brunner, CMA from our office will contact her next week to set up home study.  Pt agrees.

## 2016-04-23 NOTE — Telephone Encounter (Signed)
Patient of Dr. Gwenlyn Found  Sleep study ordered by Lurena Joiner, [place] on 03/18/16 Routed to [facility]

## 2016-04-23 NOTE — Telephone Encounter (Signed)
New message      Calling to let us know her sleep study scheduled for thurs April 19th was denied by her ins company.  Please call [phone]  Opt [employer] to appeal it.

## 2016-04-26 NOTE — Telephone Encounter (Signed)
-----   Message from [facility] sent at 04/20/2016  9:20 AM EDT ----- Regarding: Denied In [place]  In lab sleep study denied d/t doesn't meet criteria.  Will need home sleep study.  Thanks  Charmaine

## 2016-04-26 NOTE — Telephone Encounter (Signed)
Left message again infoming her that her in-lab sleep study has been denied by her insurance company. The in lab appointment for this upcoming Thursday has been cancelled. She has been rescheduled to meet with the RT to pick up the I home test and instructions on May 23rd @ 2:00 pm. If this date or time will not work for her schedule she was instructed to call [place]. Contact information left on her machine.

## 2016-04-29 ENCOUNTER — Encounter (HOSPITAL_BASED_OUTPATIENT_CLINIC_OR_DEPARTMENT_OTHER): Payer: Managed Care, Other (non HMO)

## 2016-05-06 ENCOUNTER — Ambulatory Visit (INDEPENDENT_AMBULATORY_CARE_PROVIDER_SITE_OTHER): Payer: Managed Care, Other (non HMO) | Admitting: Pharmacist

## 2016-05-06 VITALS — BP 148/80 | HR 62

## 2016-05-06 DIAGNOSIS — I10 Essential (primary) hypertension: Secondary | ICD-10-CM

## 2016-05-06 MED ORDER — CHLORTHALIDONE 25 MG PO TABS: 25.0000 mg | ORAL_TABLET | Freq: Every day | ORAL | 1 refills | Status: DC

## 2016-05-06 NOTE — Progress Notes (Signed)
Patient ID: [name]                 DOB: 1960-03-12                      MRN: [id number]     HPI:  [name] is a 56 y.o. female patient of Dr Gwenlyn Found, with a PMH positive for  CAD (60% narrowing in RCA), hypertension, obesity, metabolic syndrome, smoking, hyperlipidemia (with statin intolerance) and CKD.  We worked with her in the past on both hypertension and hyperlipidemia. She states compliance with medication regimen.  When she saw Lurena Joiner several weeks ago, she admitted to having stopped her lisinopril for about 2 months after reading something about angioedema.  Lurena Joiner explained the risks and she re-started it at that time. Hydralazine dose was increased from 50mg  three times daily, to 100mg  three times daily during last HTN clinic office visit.  Patient presents today for hypertension follow up and denies ADRs, dizziness, shortness of breath , fatigue or swelling.  Blood Pressure Goal:  130/80  Current Medications:             Amlodipine 10 mg daily (hs)             Hydralazine 100 mg three times daily             Lisinopril 20 mg twice daily              Metoprolol 75 mg twice daily             triam/hctz 37.5/25 (am)  Family Hx:             Father died from stroke at time of CABG (age 62) Mother still living, had stent placed last year Brother had stent placed at age 26, doing well  Social Hx:             Smoker, down to 6 cigarettes per day; no alcohol; caffeine - coffee at work (2-3 cups), soda 1 day, more on weekends  Diet:             Lots of chicken, only rarely fried foods; more vegetables, if canned drains and rinses first;  Some pastas but no breads  Exercise:             Walking 5 days per week on afternoon break - 1 mile around parking lot  Home BP readings: none available   Wt Readings from Last 3 Encounters:  04/03/16 227 lb 9.6 oz (103.2 kg)  03/18/16 220 lb (99.8 kg)  02/18/16 223 lb (101.2 kg)   BP Readings from  Last 3 Encounters:  05/06/16 (!) 148/80  04/08/16 (!) 172/84  04/03/16 (!) 148/98   Pulse Readings from Last 3 Encounters:  05/06/16 62  04/08/16 76  04/03/16 72    Past Medical History:  Diagnosis Date  . Angina   . CAD (coronary artery disease), non obstructive CAD in 2010    a. 2013 Cath: LAD 60, RCA 14m, 70d; b. 2014 Cath: stable anatomy.  . Diabetes mellitus without complication ([place])    a. 12/2014 HbA1c 6.5; b. Rx Glipizide - not taking.  . Family history of premature CAD   . Hyperlipidemia   . Hypertension   . Hypertensive heart disease   . Palpitations   . Reflux   . Tobacco abuse     Current Outpatient Prescriptions on File Prior to Visit  Medication Sig Dispense Refill  .  amLODipine (NORVASC) 10 MG tablet Take 1 tablet (10 mg total) by mouth daily. 90 tablet 3  . aspirin 81 MG tablet Take 81 mg by mouth at bedtime.    . cetirizine (ZYRTEC) 10 MG tablet Take 1 tablet (10 mg total) by mouth daily. 30 tablet 1  . ezetimibe (ZETIA) 10 MG tablet Take 1 tablet (10 mg total) by mouth daily. (Patient not taking: Reported on 05/06/2016) 90 tablet 2  . fluticasone (FLONASE) 50 MCG/ACT nasal spray Place 2 sprays into both nostrils daily. (Patient not taking: Reported on 04/03/2016) 16 g 1  . Guaifenesin (MUCINEX MAXIMUM STRENGTH) 1200 MG TB12 Take 1 tablet (1,200 mg total) by mouth every 12 (twelve) hours as needed. (Patient not taking: Reported on 04/03/2016) 14 tablet 1  . hydrALAZINE (APRESOLINE) 50 MG tablet Take 1 tablet (50 mg total) by mouth 3 (three) times daily. 270 tablet 3  . isosorbide mononitrate (IMDUR) 30 MG 24 hr tablet Take 1 tablet (30 mg total) by mouth daily. 90 tablet 3  . lisinopril (PRINIVIL,ZESTRIL) 40 MG tablet Take 1 tablet (40 mg total) by mouth daily. 90 tablet 3  . metoprolol (LOPRESSOR) 50 MG tablet Take 1.5 tablets (75 mg total) by mouth 2 (two) times daily. 270 tablet 3  . omega-3 acid ethyl esters (LOVAZA) 1 g capsule Take 2 capsules (2 g total) by  mouth 2 (two) times daily. 360 capsule 2  . triamterene-hydrochlorothiazide (MAXZIDE-25) 37.5-25 MG tablet TAKE 1 TABLET BY MOUTH EVERY DAY 30 tablet 11   No current facility-administered medications on file prior to visit.     Allergies  Allergen Reactions  . Bupropion     Other reaction(s): Other Suicidal thoughts and increased depression  . Rosuvastatin Other (See Comments)    myalgia  . Simvastatin     myalgias  . Wellbutrin [Bupropion Hcl]     unknown  . Zetia [Ezetimibe]     myalgias    Blood pressure (!) 148/80, pulse 62, last menstrual period 12/14/2013.  Essential hypertension: Blood pressure is better controlled today but remains above desired goal of 130/80.  Patient is not monitoring BP at home but report felling "fine" and not experiencing any discomfort, or side effect.  Plan to discontinue MAXIDE 37.5/25 and start chlorthalidone 25mg  daily.  Patient got new refill for MAXIDE only few days ago and is requesting to postponed initiation of new RX until current supply gone.   Will start chlorthalidone 25mg  in ~3 weeks , then follow up with HTN clinic 2 weeks after. Plan to repeat BMET during next office visit if patient is taking chlorthalidone as instructed.   Zaryan Yakubov Rodriguez-Guzman PharmD, [place] [id number] 05/06/2016 3:30 PM

## 2016-05-06 NOTE — Patient Instructions (Addendum)
Return for a  follow up appointment in 5 weeks  Your blood pressure today is 148/80 pulse 62   Check your blood pressure at home daily (if able) and keep record of the readings.  Take your BP meds as follows:             Amlodipine 10 mg daily every evening             Hydralazine 100 mg three times daily             Lisinopril 20 mg twice daily              Metoprolol 75 mg twice daily             **STOP taking MAXIZED 37.5/25**  **START taking chlorthalidone 25mg  every morning**  Bring all of your meds, your BP cuff and your record of home blood pressures to your next appointment.  Exercise as you're able, try to walk approximately 30 minutes per day.  Keep salt intake to a minimum, especially watch canned and prepared boxed foods.  Eat more fresh fruits and vegetables and fewer canned items.  Avoid eating in fast food restaurants.    HOW TO TAKE YOUR BLOOD PRESSURE: . Rest 5 minutes before taking your blood pressure. .  Don't smoke or drink caffeinated beverages for at least 30 minutes before. . Take your blood pressure before (not after) you eat. . Sit comfortably with your back supported and both feet on the floor (don't cross your legs). . Elevate your arm to heart level on a table or a desk. . Use the proper sized cuff. It should fit smoothly and snugly around your bare upper arm. There should be enough room to slip a fingertip under the cuff. The bottom edge of the cuff should be 1 inch above the crease of the elbow. . Ideally, take 3 measurements at one sitting and record the average.

## 2016-05-21 ENCOUNTER — Other Ambulatory Visit: Payer: Self-pay | Admitting: Nurse Practitioner

## 2016-05-21 ENCOUNTER — Encounter: Payer: Self-pay | Admitting: Internal Medicine

## 2016-05-21 ENCOUNTER — Ambulatory Visit (AMBULATORY_SURGERY_CENTER): Payer: Self-pay | Admitting: *Deleted

## 2016-05-21 VITALS — Ht 64.0 in | Wt 226.4 lb

## 2016-05-21 DIAGNOSIS — Z1211 Encounter for screening for malignant neoplasm of colon: Secondary | ICD-10-CM

## 2016-05-21 NOTE — Telephone Encounter (Signed)
REFILL 

## 2016-05-21 NOTE — Progress Notes (Signed)
No allergies to eggs or soy. No problems with anesthesia.  Pt given Emmi instructions for colonoscopy  No oxygen use  No diet drug use  

## 2016-06-02 ENCOUNTER — Encounter (HOSPITAL_BASED_OUTPATIENT_CLINIC_OR_DEPARTMENT_OTHER): Payer: Self-pay

## 2016-06-02 ENCOUNTER — Ambulatory Visit (HOSPITAL_BASED_OUTPATIENT_CLINIC_OR_DEPARTMENT_OTHER): Payer: Managed Care, Other (non HMO) | Admitting: Cardiovascular Disease

## 2016-06-03 ENCOUNTER — Other Ambulatory Visit: Payer: Self-pay | Admitting: Nurse Practitioner

## 2016-06-04 ENCOUNTER — Ambulatory Visit (AMBULATORY_SURGERY_CENTER): Payer: Managed Care, Other (non HMO) | Admitting: Internal Medicine

## 2016-06-04 ENCOUNTER — Encounter: Payer: Self-pay | Admitting: Internal Medicine

## 2016-06-04 VITALS — BP 121/70 | HR 57 | Temp 98.4°F | Resp 12 | Ht 67.0 in | Wt 227.0 lb

## 2016-06-04 DIAGNOSIS — Z1212 Encounter for screening for malignant neoplasm of rectum: Secondary | ICD-10-CM | POA: Diagnosis not present

## 2016-06-04 DIAGNOSIS — Z1211 Encounter for screening for malignant neoplasm of colon: Secondary | ICD-10-CM | POA: Diagnosis present

## 2016-06-04 DIAGNOSIS — D128 Benign neoplasm of rectum: Secondary | ICD-10-CM

## 2016-06-04 DIAGNOSIS — D122 Benign neoplasm of ascending colon: Secondary | ICD-10-CM

## 2016-06-04 DIAGNOSIS — D124 Benign neoplasm of descending colon: Secondary | ICD-10-CM

## 2016-06-04 DIAGNOSIS — K621 Rectal polyp: Secondary | ICD-10-CM | POA: Diagnosis not present

## 2016-06-04 DIAGNOSIS — D129 Benign neoplasm of anus and anal canal: Secondary | ICD-10-CM

## 2016-06-04 MED ORDER — SODIUM CHLORIDE 0.9 % IV SOLN: 500.0000 mL | INTRAVENOUS | Status: DC

## 2016-06-04 NOTE — Progress Notes (Signed)
A and O x3. Report to RN. Tolerated MAC anesthesia well.

## 2016-06-04 NOTE — Op Note (Signed)
[facility] Patient Name: [name] Procedure Date: 06/04/2016 11:01 AM MRN: [id number] Endoscopist: Gatha Mayer , MD Age: 56 Referring MD:  Date of Birth: 03-17-1960 Gender: Female Account #: [id number] Procedure:                Colonoscopy Indications:              Screening for colorectal malignant neoplasm, This                            is the patient's first colonoscopy Medicines:                Propofol per Anesthesia, Monitored Anesthesia Care Procedure:                Pre-Anesthesia Assessment:                           - Prior to the procedure, a History and Physical                            was performed, and patient medications and                            allergies were reviewed. The patient's tolerance of                            previous anesthesia was also reviewed. The risks                            and benefits of the procedure and the sedation                            options and risks were discussed with the patient.                            All questions were answered, and informed consent                            was obtained. Prior Anticoagulants: The patient                            last took aspirin 1 day prior to the procedure. ASA                            Grade Assessment: III - A patient with severe                            systemic disease. After reviewing the risks and                            benefits, the patient was deemed in satisfactory                            condition to undergo the procedure.  After obtaining informed consent, the colonoscope                            was passed under direct vision. Throughout the                            procedure, the patient's blood pressure, pulse, and                            oxygen saturations were monitored continuously. The                            Colonoscope was introduced through the anus and                            advanced  to the the cecum, identified by                            appendiceal orifice and ileocecal valve. The                            colonoscopy was performed without difficulty. The                            patient tolerated the procedure well. The quality                            of the bowel preparation was good. The bowel                            preparation used was Miralax. The ileocecal valve,                            appendiceal orifice, and rectum were photographed. Scope In: 11:14:17 AM Scope Out: 11:32:12 AM Scope Withdrawal Time: 0 hours 15 minutes 45 seconds  Total Procedure Duration: 0 hours 17 minutes 55 seconds  Findings:                 The perianal and digital rectal examinations were                            normal.                           A 15 mm polyp was found in the ascending colon. The                            polyp was sessile. The polyp was removed with a hot                            snare. Resection and retrieval were complete.                            Verification of patient identification for the  specimen was done. Estimated blood loss: none.                           Two sessile and semi-pedunculated polyps were found                            in the rectum and descending colon. The polyps were                            5 to 7 mm in size. These polyps were removed with a                            cold snare. Resection and retrieval were complete.                            Verification of patient identification for the                            specimen was done. Estimated blood loss was minimal.                           Multiple small and large-mouthed diverticula were                            found in the sigmoid colon.                           The exam was otherwise without abnormality on                            direct and retroflexion views. Complications:            No immediate  complications. Estimated Blood Loss:     Estimated blood loss was minimal. Impression:               - One 15 mm polyp in the ascending colon, removed                            with a hot snare. Resected and retrieved.                           - Two 5 to 7 mm polyps in the rectum and in the                            descending colon, removed with a cold snare.                            Resected and retrieved.                           - Diverticulosis in the sigmoid colon.                           - The examination was otherwise normal on  direct                            and retroflexion views. Recommendation:           - Patient has a contact number available for                            emergencies. The signs and symptoms of potential                            delayed complications were discussed with the                            patient. Return to normal activities tomorrow.                            Written discharge instructions were provided to the                            patient.                           - Resume previous diet.                           - Continue present medications.                           - No aspirin, ibuprofen, naproxen, or other                            non-steroidal anti-inflammatory drugs for 2 weeks                            after polyp removal.                           - Repeat colonoscopy is recommended. The                            colonoscopy date will be determined after pathology                            results from today's exam become available for                            review. Gatha Mayer, MD 06/04/2016 11:40:48 AM This report has been signed electronically.

## 2016-06-04 NOTE — Progress Notes (Signed)
Called to room to assist during endoscopic procedure.  Patient ID and intended procedure confirmed with present staff. Received instructions for my participation in the procedure from the performing physician.  

## 2016-06-04 NOTE — Patient Instructions (Addendum)
I found and removed 3 polyps - all look benign.  You also have diverticulosis - thickened muscle rings and pouches in the colon wall. Please read the handout about this condition.  I will let you know pathology results and when to have another routine colonoscopy by mail and/or My Chart. It will probably be 3 years.  I appreciate the opportunity to care for you. Gatha Mayer, MD, FACG  YOU HAD AN ENDOSCOPIC PROCEDURE TODAY AT [facility]:   Refer to the procedure report that was given to you for any specific questions about what was found during the examination.  If the procedure report does not answer your questions, please call your gastroenterologist to clarify.  If you requested that your care partner not be given the details of your procedure findings, then the procedure report has been included in a sealed envelope for you to review at your convenience later.  YOU SHOULD EXPECT: Some feelings of bloating in the abdomen. Passage of more gas than usual.  Walking can help get rid of the air that was put into your GI tract during the procedure and reduce the bloating. If you had a lower endoscopy (such as a colonoscopy or flexible sigmoidoscopy) you may notice spotting of blood in your stool or on the toilet paper. If you underwent a bowel prep for your procedure, you may not have a normal bowel movement for a few days.  Please Note:  You might notice some irritation and congestion in your nose or some drainage.  This is from the oxygen used during your procedure.  There is no need for concern and it should clear up in a day or so.  SYMPTOMS TO REPORT IMMEDIATELY:   Following lower endoscopy (colonoscopy or flexible sigmoidoscopy):  Excessive amounts of blood in the stool  Significant tenderness or worsening of abdominal pains  Swelling of the abdomen that is new, acute  Fever of 100F or higher  For urgent or emergent issues, a gastroenterologist can be  reached at any hour by calling [phone].   DIET:  We do recommend a small meal at first, but then you may proceed to your regular diet.  Drink plenty of fluids but you should avoid alcoholic beverages for 24 hours.  ACTIVITY:  You should plan to take it easy for the rest of today and you should NOT DRIVE or use heavy machinery until tomorrow (because of the sedation medicines used during the test).    FOLLOW UP: Our staff will call the number listed on your records the next business day following your procedure to check on you and address any questions or concerns that you may have regarding the information given to you following your procedure. If we do not reach you, we will leave a message.  However, if you are feeling well and you are not experiencing any problems, there is no need to return our call.  We will assume that you have returned to your regular daily activities without incident.  If any biopsies were taken you will be contacted by phone or by letter within the next 1-3 weeks.  Please call us at [phone] if you have not heard about the biopsies in 3 weeks.    SIGNATURES/CONFIDENTIALITY: You and/or your care partner have signed paperwork which will be entered into your electronic medical record.  These signatures attest to the fact that that the information above on your After Visit Summary has been reviewed and is  understood.  Full responsibility of the confidentiality of this discharge information lies with you and/or your care-partner.  Polyp and diverticulosis information given.

## 2016-06-04 NOTE — Progress Notes (Signed)
Pt's states no medical or surgical changes since previsit or office visit. 

## 2016-06-07 ENCOUNTER — Encounter: Payer: Self-pay | Admitting: Physician Assistant

## 2016-06-07 DIAGNOSIS — K5790 Diverticulosis of intestine, part unspecified, without perforation or abscess without bleeding: Secondary | ICD-10-CM | POA: Insufficient documentation

## 2016-06-08 ENCOUNTER — Telehealth: Payer: Self-pay

## 2016-06-08 NOTE — Telephone Encounter (Signed)
Called [phone] and left a messaged we tried to reach pt for a follow up call. maw

## 2016-06-08 NOTE — Telephone Encounter (Signed)
  Follow up Call-  Call back number 06/04/2016  Post procedure Call Back phone  # [phone]  Permission to leave phone message Yes  Some recent data might be hidden     Patient questions:  Do you have a fever, pain , or abdominal swelling? No. Pain Score  0 *  Have you tolerated food without any problems? Yes.    Have you been able to return to your normal activities? Yes.    Do you have any questions about your discharge instructions: Diet   No. Medications  No. Follow up visit  No.  Do you have questions or concerns about your Care? No.  Actions: * If pain score is 4 or above: No action needed, pain <4.

## 2016-06-09 ENCOUNTER — Telehealth: Payer: Self-pay | Admitting: *Deleted

## 2016-06-09 ENCOUNTER — Other Ambulatory Visit: Payer: Self-pay | Admitting: *Deleted

## 2016-06-09 DIAGNOSIS — G4733 Obstructive sleep apnea (adult) (pediatric): Secondary | ICD-10-CM

## 2016-06-09 NOTE — Telephone Encounter (Signed)
Received a call from [place] with [facility]. She informed me patient did a home sleep test that will need to be repeated. She says looks like a wire may have came loose during the test. She requested that a new order be placed. I asked if I need to send  request for another pre certification. She said no. First pre cert should still be good. Also they had not billed for the first one. There will only be a charge for one of the studies. Order placed as requested.

## 2016-06-11 ENCOUNTER — Encounter: Payer: Self-pay | Admitting: Internal Medicine

## 2016-06-11 DIAGNOSIS — Z860101 Personal history of adenomatous and serrated colon polyps: Secondary | ICD-10-CM

## 2016-06-11 DIAGNOSIS — Z86010 Personal history of colonic polyps: Secondary | ICD-10-CM

## 2016-06-11 HISTORY — DX: Personal history of adenomatous and serrated colon polyps: Z86.0101

## 2016-06-11 HISTORY — DX: Personal history of colonic polyps: Z86.010

## 2016-06-17 NOTE — Telephone Encounter (Signed)
Erroneous encounter

## 2016-06-23 ENCOUNTER — Ambulatory Visit (INDEPENDENT_AMBULATORY_CARE_PROVIDER_SITE_OTHER): Payer: Managed Care, Other (non HMO) | Admitting: Pharmacist

## 2016-06-23 VITALS — BP 160/80 | HR 64

## 2016-06-23 DIAGNOSIS — I10 Essential (primary) hypertension: Secondary | ICD-10-CM | POA: Diagnosis not present

## 2016-06-23 LAB — BASIC METABOLIC PANEL
BUN / CREAT RATIO: 16 (ref 9–23)
BUN: 21 mg/dL (ref 6–24)
CHLORIDE: 100 mmol/L (ref 96–106)
CO2: 20 mmol/L (ref 20–29)
Calcium: 8.6 mg/dL — ABNORMAL LOW (ref 8.7–10.2)
Creatinine, Ser: 1.3 mg/dL — ABNORMAL HIGH (ref 0.57–1.00)
GFR calc Af Amer: 54 mL/min/{1.73_m2} — ABNORMAL LOW (ref >59)
GFR calc non Af Amer: 47 mL/min/{1.73_m2} — ABNORMAL LOW (ref >59)
GLUCOSE: 130 mg/dL — AB (ref 65–99)
Potassium: 3.8 mmol/L (ref 3.5–5.2)
Sodium: 137 mmol/L (ref 134–144)

## 2016-06-23 MED ORDER — DOXAZOSIN MESYLATE 1 MG PO TABS: 1.0000 mg | ORAL_TABLET | Freq: Every day | ORAL | 1 refills | Status: DC

## 2016-06-23 NOTE — Patient Instructions (Addendum)
Return for a a follow up appointment in 4 weeks  Your blood pressure today is 160/80 pulse 64  Check your blood pressure at home daily (if able) and keep record of the readings.  Take your BP meds as follows:  Morning: Chlorthalidone 25mg , Lisinopril 40mg , Metoprolol 75mg  and Hydralazine 100mg  Lunch: Hydralazine 100mg  Evening: Amlodipine 10mg  , Metoprolol 75mg ,  hydralazine 100mg  and doxazosin 1mg   Bring your BP cuff and your record of home blood pressures to your next appointment.  Exercise as you're able, try to walk approximately 30 minutes per day.  Keep salt intake to a minimum, especially watch canned and prepared boxed foods.  Eat more fresh fruits and vegetables and fewer canned items.  Avoid eating in fast food restaurants.    HOW TO TAKE YOUR BLOOD PRESSURE: . Rest 5 minutes before taking your blood pressure. .  Don't smoke or drink caffeinated beverages for at least 30 minutes before. . Take your blood pressure before (not after) you eat. . Sit comfortably with your back supported and both feet on the floor (don't cross your legs). . Elevate your arm to heart level on a table or a desk. . Use the proper sized cuff. It should fit smoothly and snugly around your bare upper arm. There should be enough room to slip a fingertip under the cuff. The bottom edge of the cuff should be 1 inch above the crease of the elbow. . Ideally, take 3 measurements at one sitting and record the average.

## 2016-06-23 NOTE — Progress Notes (Signed)
Patient ID: [name]                 DOB: Nov 30, 1960                      MRN: [id number]     HPI: [name] is a 56 y.o. female referred by Dr. Gwenlyn Found to HTN clinic. PMH positive for  CAD (60% narrowing in RCA), hypertension, obesity, metabolic syndrome, current smoker, hyperlipidemia (with statin intolerance) and CKD. We worked with her in the past on both hypertension and hyperlipidemia. She states compliance with medication regimen. During last office visit her triamterene/HCTZ 37.5/25 was discontinued and substituted by chlorthalidone 25mg  daily (paient switched to chlorthalidone 2 weeks ago). Plan to repeat BMET today.  Patient presents today for hypertension follow up and denies ADRs, dizziness, shortness of breath , fatigue or swelling. Some chest pain but patient expressed is muscle related and painful to touch.  Blood Pressure Goal: 130/80  Current Medications:  Amlodipine 10 mg daily every evening Chlorthalidone 25mg  every morning  Hydralazine 100 mg twice  daily  Lisinopril 20 mg twice daily  Metoprolol 75 mg twice daily    Family History: Father died from stroke at time of CABG (age 38) Mother still living, had stent placed last year Brother had stent placed at age 98, doing well  Social History: Smoker 6-7 cigarettes per day; no alcohol; caffeine - coffee at work (2-3 cups), soda 1 day, more on weekends  Diet: Lots of chicken, only rarely fried foods; more vegetables, if canned drains and rinses first; Some pastas but no breads  Exercise: Walking 5 days per week on afternoon break - 1 mile around parking lot  Home BP readings: 142/86 average at home (no records provided)  Wt Readings from Last 3 Encounters:  06/04/16 227 lb (103 kg)  06/02/16 220 lb (99.8 kg)  05/21/16 226 lb 6.4 oz (102.7 kg)   BP Readings from Last 3 Encounters:  06/23/16 (!) 160/80    06/04/16 121/70  05/06/16 (!) 148/80   Pulse Readings from Last 3 Encounters:  06/23/16 64  06/04/16 (!) 57  05/06/16 62    Past Medical History:  Diagnosis Date  . Angina   . CAD (coronary artery disease), non obstructive CAD in 2010    a. 2013 Cath: LAD 60, RCA 73m, 70d; b. 2014 Cath: stable anatomy.  . Diabetes mellitus without complication ([place])    a. 12/2014 HbA1c 6.5; b. Rx Glipizide - not taking.  . Family history of premature CAD   . Hx of adenomatous colonic polyps 06/11/2016  . Hyperlipidemia   . Hypertension   . Hypertensive heart disease   . Palpitations   . Reflux   . Tobacco abuse     Current Outpatient Prescriptions on File Prior to Visit  Medication Sig Dispense Refill  . amLODipine (NORVASC) 10 MG tablet Take 1 tablet (10 mg total) by mouth daily. 90 tablet 3  . chlorthalidone (HYGROTON) 25 MG tablet Take 1 tablet (25 mg total) by mouth daily. 30 tablet 1  . hydrALAZINE (APRESOLINE) 50 MG tablet Take 1 tablet (50 mg total) by mouth 3 (three) times daily. 270 tablet 3  . isosorbide mononitrate (IMDUR) 30 MG 24 hr tablet Take 1 tablet (30 mg total) by mouth daily. 90 tablet 3  . lisinopril (PRINIVIL,ZESTRIL) 40 MG tablet Take 1 tablet (40 mg total) by mouth daily. 90 tablet 3  . metoprolol (LOPRESSOR) 50 MG tablet TAKE  1 AND 1/2 TABLETS BY MOUTH TWICE A DAY 90 tablet 11  . omega-3 acid ethyl esters (LOVAZA) 1 g capsule Take 2 capsules (2 g total) by mouth 2 (two) times daily. 360 capsule 2  . aspirin 81 MG tablet Take 81 mg by mouth at bedtime.    . Bisacodyl (DULCOLAX PO) Take by mouth as directed. Dulcolax as directed for colonoscopy prep    . cetirizine (ZYRTEC) 10 MG tablet Take 1 tablet (10 mg total) by mouth daily. (Patient not taking: Reported on 05/21/2016) 30 tablet 1  . fluticasone (FLONASE) 50 MCG/ACT nasal spray Place 2 sprays into both nostrils daily. (Patient not taking: Reported on 04/03/2016) 16 g 1  . Guaifenesin (MUCINEX MAXIMUM STRENGTH) 1200 MG  TB12 Take 1 tablet (1,200 mg total) by mouth every 12 (twelve) hours as needed. (Patient not taking: Reported on 04/03/2016) 14 tablet 1  . Polyethylene Glycol 3350 (MIRALAX PO) Take by mouth as directed. Miralax 238 Grams as directed for colonoscopy prep     Current Facility-Administered Medications on File Prior to Visit  Medication Dose Route Frequency Provider Last Rate Last Dose  . 0.9 %  sodium chloride infusion  500 mL Intravenous Continuous Gatha Mayer, MD        Allergies  Allergen Reactions  . Bupropion     Other reaction(s): Other Suicidal thoughts and increased depression  . Rosuvastatin Other (See Comments)    myalgia  . Simvastatin     myalgias  . Wellbutrin [Bupropion Hcl]     unknown  . Zetia [Ezetimibe]     myalgias    Blood pressure (!) 160/80, pulse 64, last menstrual period 12/14/2013, SpO2 96 %.  Essential hypertension:  Blood pressure remains above goal of <130/80. Patient continues to smoke and if fearful to initiate Chantix due to ADRs with bupropion in the past. No home BP records available today either. Will increase hydralazine to 100mg  three times daily as previously prescribed and add doxazosin 1mg  to therapy.  Regimen will be as follow: Morning: Chlorthalidone 25mg , Lisinopril 40mg , Metoprolol 75mg  and Hydralazine 100mg  Lunch: Hydralazine 100mg  Evening: Amlodipine 10mg  , Metoprolol 75mg ,  hydralazine 100mg  and doxazosin 1mg   Patient to bring records of BP home BP readings for next f/u in 4 weeks. BMET done today shows stable Scr and electrolytes.   Janit Cutter Rodriguez-Guzman PharmD, [place] [address] 06/23/2016 8:10 AM

## 2016-06-24 ENCOUNTER — Encounter: Payer: Self-pay | Admitting: Pharmacist

## 2016-07-19 ENCOUNTER — Other Ambulatory Visit: Payer: Self-pay | Admitting: Cardiovascular Disease

## 2016-07-21 ENCOUNTER — Ambulatory Visit (HOSPITAL_BASED_OUTPATIENT_CLINIC_OR_DEPARTMENT_OTHER): Payer: Managed Care, Other (non HMO) | Attending: Cardiovascular Disease | Admitting: Cardiovascular Disease

## 2016-07-21 VITALS — Ht 64.0 in | Wt 220.0 lb

## 2016-07-21 DIAGNOSIS — G4733 Obstructive sleep apnea (adult) (pediatric): Secondary | ICD-10-CM | POA: Diagnosis not present

## 2016-07-27 ENCOUNTER — Ambulatory Visit (INDEPENDENT_AMBULATORY_CARE_PROVIDER_SITE_OTHER): Payer: Managed Care, Other (non HMO) | Admitting: Pharmacist

## 2016-07-27 VITALS — BP 142/80

## 2016-07-27 DIAGNOSIS — I10 Essential (primary) hypertension: Secondary | ICD-10-CM

## 2016-07-27 MED ORDER — HYDRALAZINE HCL 100 MG PO TABS: 100.0000 mg | ORAL_TABLET | Freq: Three times a day (TID) | ORAL | 3 refills | Status: DC

## 2016-07-27 MED ORDER — CHLORTHALIDONE 25 MG PO TABS: 25.0000 mg | ORAL_TABLET | Freq: Every day | ORAL | 3 refills | Status: DC

## 2016-07-27 MED ORDER — METOPROLOL TARTRATE 50 MG PO TABS: ORAL_TABLET | ORAL | 3 refills | Status: DC

## 2016-07-27 MED ORDER — DOXAZOSIN MESYLATE 2 MG PO TABS: 2.0000 mg | ORAL_TABLET | Freq: Every day | ORAL | 0 refills | Status: DC

## 2016-07-27 NOTE — Patient Instructions (Addendum)
Return for a follow up appointment in 6-8 weeks  Your blood pressure today is 142/80 pulse 60  Check your blood pressure at home daily (if able) and keep record of the readings.  Take your BP meds as follows: **INCREASE doxazosin to 2mg  at bedtime**   Morning: Chlorthalidone 25mg , Lisinopril 40mg , Metoprolol 75mg  and Hydralazine 100mg   Lunch: Hydralazine 100mg   Evening: Amlodipine 10mg  , Metoprolol 75mg ,  hydralazine 100mg  and Doxazosin 2mg   Bring all of your meds, your BP cuff and your record of home blood pressures to your next appointment.  Exercise as you're able, try to walk approximately 30 minutes per day.  Keep salt intake to a minimum, especially watch canned and prepared boxed foods.  Eat more fresh fruits and vegetables and fewer canned items.  Avoid eating in fast food restaurants.    HOW TO TAKE YOUR BLOOD PRESSURE: . Rest 5 minutes before taking your blood pressure. .  Don't smoke or drink caffeinated beverages for at least 30 minutes before. . Take your blood pressure before (not after) you eat. . Sit comfortably with your back supported and both feet on the floor (don't cross your legs). . Elevate your arm to heart level on a table or a desk. . Use the proper sized cuff. It should fit smoothly and snugly around your bare upper arm. There should be enough room to slip a fingertip under the cuff. The bottom edge of the cuff should be 1 inch above the crease of the elbow. . Ideally, take 3 measurements at one sitting and record the average.

## 2016-07-27 NOTE — Assessment & Plan Note (Addendum)
Blood pressure remains elevated above goal today and at home. Patient denies dizziness, headaches, swelling, chest pain or increased fatigue. Noted she is not taking medication is taking Lisinopril 2 times daily (noted BMET stable 4 weeks ago), and confusion over hydralazine instructions also noted.  Will increase doxazosin to 2mg  at bedtime, resume hydralazine 100mg  TID, and decrease Lisinopril to 40mg  daily.  Correct technique for BP measurement and wrist device used discussed and practice during appointment.  Patient to monitor BP at home and keep records to bring to next f/u appointment in 5 weeks.  Plan to add spironolactone or increase doxazosin to 4mg  if better BP control needed.

## 2016-07-27 NOTE — Progress Notes (Signed)
Patient ID: [name]                 DOB: 04/03/1960                      MRN: [id number]     HPI: [name] is a 56 y.o. female referred by Dr. Gwenlyn Found to HTN clinic. PMH positive for CAD (60% narrowing in RCA), hypertension, obesity, metabolic syndrome, current smoker, hyperlipidemia (with statin intolerance) and CKD. We worked with her in the past on both hypertension and hyperlipidemia. She states compliance with medication regimen.  BMET from 06/23/2016 showed stable renal function. Doxazosin added to her regimen during last HTN f/u but to improve BP readings but initiated al low dose for tolerability. Patient denies dizziness, headaches, swelling, chest pain or increased fatigue.  Patient stated she is taking lisinopril 40mg  twice daily; per patient statement not sure if taking hydralazine as prescribed (taking 50-100 twice daily instead??). Only 3 BP home readings provided today, but noted patient using wrong technique for wrist device.  Blood Pressure Goal: 130/80  Current Medications:  Morning: Chlorthalidone 25mg , Lisinopril 40mg , Metoprolol 75mg  and Hydralazine 100mg   Lunch: Hydralazine 100mg   Evening: Amlodipine 10mg  , Metoprolol 75mg ,  hydralazine 100mg  and Doxazosin 1mg     Family History: Father died from stroke at time of CABG (age 80) Mother still living, had stent placed last year Brother had stent placed at age 6, doing well  Social History: Smoker 6-7 cigarettes per day; no alcohol; caffeine - coffee at work (2-3 cups), soda 1 day, more on weekends  Diet: Lots of chicken, only rarely fried foods; more vegetables, if canned drains and rinses first; Some pastas but no breads  Exercise: Walking 5 days per week on afternoon break - 1 mile around parking lot  Home BP readings: Only 3 readings provided 193/133; 197/109; 201/116 Wrist cuff - accurate within 75mm Hg  Wt Readings from Last 3 Encounters:   06/04/16 227 lb (103 kg)  06/02/16 220 lb (99.8 kg)  05/21/16 226 lb 6.4 oz (102.7 kg)   BP Readings from Last 3 Encounters:  07/27/16 (!) 142/80  06/23/16 (!) 160/80  06/04/16 121/70   Pulse Readings from Last 3 Encounters:  06/23/16 64  06/04/16 (!) 57  05/06/16 62    Past Medical History:  Diagnosis Date  . Angina   . CAD (coronary artery disease), non obstructive CAD in 2010    a. 2013 Cath: LAD 60, RCA 110m, 70d; b. 2014 Cath: stable anatomy.  . Diabetes mellitus without complication ([place])    a. 12/2014 HbA1c 6.5; b. Rx Glipizide - not taking.  . Family history of premature CAD   . Hx of adenomatous colonic polyps 06/11/2016  . Hyperlipidemia   . Hypertension   . Hypertensive heart disease   . Palpitations   . Reflux   . Tobacco abuse     Current Outpatient Prescriptions on File Prior to Visit  Medication Sig Dispense Refill  . amLODipine (NORVASC) 10 MG tablet Take 1 tablet (10 mg total) by mouth daily. 90 tablet 3  . aspirin 81 MG tablet Take 81 mg by mouth at bedtime.    . Bisacodyl (DULCOLAX PO) Take by mouth as directed. Dulcolax as directed for colonoscopy prep    . cetirizine (ZYRTEC) 10 MG tablet Take 1 tablet (10 mg total) by mouth daily. (Patient not taking: Reported on 05/21/2016) 30 tablet 1  . ezetimibe (ZETIA) 10 MG tablet TAKE  1 TABLET BY MOUTH EVERY DAY 30 tablet 2  . fluticasone (FLONASE) 50 MCG/ACT nasal spray Place 2 sprays into both nostrils daily. (Patient not taking: Reported on 04/03/2016) 16 g 1  . Guaifenesin (MUCINEX MAXIMUM STRENGTH) 1200 MG TB12 Take 1 tablet (1,200 mg total) by mouth every 12 (twelve) hours as needed. (Patient not taking: Reported on 04/03/2016) 14 tablet 1  . isosorbide mononitrate (IMDUR) 30 MG 24 hr tablet Take 1 tablet (30 mg total) by mouth daily. 90 tablet 3  . lisinopril (PRINIVIL,ZESTRIL) 40 MG tablet Take 1 tablet (40 mg total) by mouth daily. 90 tablet 3  . omega-3 acid ethyl esters (LOVAZA) 1 g capsule Take 2  capsules (2 g total) by mouth 2 (two) times daily. 360 capsule 2  . Polyethylene Glycol 3350 (MIRALAX PO) Take by mouth as directed. Miralax 238 Grams as directed for colonoscopy prep     Current Facility-Administered Medications on File Prior to Visit  Medication Dose Route Frequency Provider Last Rate Last Dose  . 0.9 %  sodium chloride infusion  500 mL Intravenous Continuous Gatha Mayer, MD        Allergies  Allergen Reactions  . Bupropion     Other reaction(s): Other Suicidal thoughts and increased depression  . Rosuvastatin Other (See Comments)    myalgia  . Simvastatin     myalgias  . Wellbutrin [Bupropion Hcl]     unknown  . Zetia [Ezetimibe]     myalgias    Blood pressure (!) 142/80, last menstrual period 12/14/2013.  Essential hypertension Blood pressure remains elevated above goal today and at home. Patient denies dizziness, headaches, swelling, chest pain or increased fatigue. Noted she is not taking medication is taking Lisinopril 2 times daily (noted BMET stable 4 weeks ago), and confusion over hydralazine instructions also noted.  Will increase doxazosin to 2mg  at bedtime, resume hydralazine 100mg  TID, and decrease Lisinopril to 40mg  daily.  Correct technique for BP measurement and wrist device used discussed and practice during appointment.  Patient to monitor BP at home and keep records to bring to next f/u appointment in 5 weeks.  Plan to add spironolactone or increase doxazosin to 4mg  if better BP control needed.  Kelsay Haggard Rodriguez-Guzman PharmD, [place] [address] 07/27/2016 1:05 PM

## 2016-07-30 ENCOUNTER — Other Ambulatory Visit: Payer: Self-pay

## 2016-07-30 MED ORDER — DOXAZOSIN MESYLATE 2 MG PO TABS: 2.0000 mg | ORAL_TABLET | Freq: Every day | ORAL | 3 refills | Status: DC

## 2016-09-01 NOTE — Procedures (Signed)
     Patient Name: [name], [name] Date: 07/21/2016 Gender: Female D.O.B: 05-23-1960 Age (years): 74 Referring Provider: Shelva Majestic MD, ABSM Height (inches): 64 Interpreting Physician: Shelva Majestic MD, ABSM Weight (lbs): 220 RPSGT: Jacolyn Reedy BMI: 38 MRN: [id number] Neck Size: 15.50  CLINICAL INFORMATION Sleep Study Type: Home Sleep Test  Indication for sleep study: OSA  Epworth Sleepiness Score: 13  SLEEP STUDY TECHNIQUE A multi-channel overnight portable sleep study was performed. The channels recorded were: nasal airflow, thoracic respiratory movement, and oxygen saturation with a pulse oximetry. Snoring was also monitored.  MEDICATIONS Amlodipine 10 mg daily every evening Chlorthalidone 25mg  every morning Hydralazine 100 mg twice  daily  Lisinopril 20 mg twice daily  Metoprolol 75 mg twice daily          Patient self administered medications include: N/A.  SLEEP ARCHITECTURE Patient was studied for 373.0 minutes. The sleep efficiency was 97.4 % and the patient was supine for 62.1%. The arousal index was 0.0 per hour.  RESPIRATORY PARAMETERS The overall AHI was 8.7 per hour, with a central apnea index of 0.0 per hour.  The oxygen nadir was 74% during sleep.  CARDIAC DATA Mean heart rate during sleep was 69.6 bpm. The slowest heart rate was 56 bpm.  IMPRESSIONS - Mild obstructive sleep apnea occurred during this study (AHI = 8.7/h). - No significant central sleep apnea occurred during this study (CAI = 0.0/h). - Severe oxygen desaturation was noted during this study (Min O2 = 74%). - Patient snored 42.8% during the sleep.  DIAGNOSIS - Obstructive Sleep Apnea (327.23 [G47.33 ICD-10]) - Nocturnal Hypoxemia (327.26 [G47.36 ICD-10])  RECOMMENDATIONS - In this patient with significant cardiovascular comorbidities including coronary artery disease and hypertension recommend therapeutic CPAP titration to determine optimal pressure required to  alleviate sleep disordered breathing. Since the home sleep study does not identify sleep stage, with the significant oxygen desaturation to 74% it is very likely that sleep apnea may be more significant during REM sleep. - Effort should be made to optimize nasal and oral pharyngeal patency - Avoid alcohol, sedatives and other CNS depressants that may worsen sleep apnea and disrupt normal sleep architecture. - Sleep hygiene should be reviewed to assess factors that may improve sleep quality. - Weight management and regular exercise should be initiated or continued. - If patient is against CPAP titration and therapy recommend a to Sleep clinic evaluationto discuss the results of this study and other options.  Otherwise, a sleep clinic evaluation should be done after CPAP titration and on treatment prior to 90 days of initiation. - Patient may benefit from in-lab study rather than an auto titration at home.  [Electronically signed] 09/01/2016 03:38 PM  Shelva Majestic MD, [facility], ABSM Diplomate, American Board of Sleep Medicine   NPI: [id number] Susan Moore PH: [phone]   FX: [phone] [place]

## 2016-09-08 ENCOUNTER — Ambulatory Visit (INDEPENDENT_AMBULATORY_CARE_PROVIDER_SITE_OTHER): Payer: Managed Care, Other (non HMO) | Admitting: Pharmacist

## 2016-09-08 ENCOUNTER — Telehealth: Payer: Self-pay | Admitting: *Deleted

## 2016-09-08 VITALS — BP 180/90 | HR 55 | Wt 227.4 lb

## 2016-09-08 DIAGNOSIS — I10 Essential (primary) hypertension: Secondary | ICD-10-CM

## 2016-09-08 DIAGNOSIS — G4733 Obstructive sleep apnea (adult) (pediatric): Secondary | ICD-10-CM

## 2016-09-08 MED ORDER — IRBESARTAN 300 MG PO TABS: 300.0000 mg | ORAL_TABLET | Freq: Every day | ORAL | 1 refills | Status: DC

## 2016-09-08 NOTE — Progress Notes (Signed)
Patient ID: [name]                 DOB: Dec 26, 1960                      MRN: [id number]     HPI: [name] is a 56 y.o. female referred by Dr. Gwenlyn Found to HTN clinic. PMH positive for CAD (60% narrowing in RCA), hypertension, obesity, metabolic syndrome, current smoker, hyperlipidemia (with statin intolerance) and CKD. We worked with her in the past on both hypertension and hyperlipidemia.  Patient denies dizziness, headaches, swelling, chest pain or increased fatigue. Patient stopped taking doxazosin after medication caused some palpitations. She is not feeling well today reporting feeling very tired, not sleeping well, stressed and nauseated.  Denies vomiting, fever, diarrhea, chest pain, dizziness or headaches.  She reports decreased amount of cigarettes use as well.  Blood Pressure Goal: 130/80  Current Medications:  Morning: Chlorthalidone 25mg , Lisinopril 40mg , Metoprolol 75mg  and Hydralazine 100mg   Lunch: Hydralazine 100mg   Evening: Amlodipine 10mg  , Metoprolol 75mg ,  hydralazine 100mg      Family History: Father died from stroke at time of CABG (age 17) Mother still living, had stent placed last year Brother had stent placed at age 28, doing well  Social History: Smoker 2-3 cigarettes per day; no alcohol; caffeine - coffee at work (2-3 cups), soda 1 day, more on weekends  Diet: Lots of chicken, only rarely fried foods; more vegetables, if canned drains and rinses first; Some pastas but no breads  Exercise: Walking 5 days per week on afternoon break - 1 mile around parking lot  Home BP readings: none available Wrist cuff - accurate within 60mm Hg  Wt Readings from Last 3 Encounters:  09/08/16 227 lb 6.4 oz (103.1 kg)  07/21/16 220 lb (99.8 kg)  06/04/16 227 lb (103 kg)   BP Readings from Last 3 Encounters:  09/08/16 (!) 180/90  07/27/16 (!) 142/80  06/23/16 (!) 160/80   Pulse Readings from Last 3  Encounters:  09/08/16 (!) 55  06/23/16 64  06/04/16 (!) 57    Past Medical History:  Diagnosis Date  . Angina   . CAD (coronary artery disease), non obstructive CAD in 2010    a. 2013 Cath: LAD 60, RCA 62m, 70d; b. 2014 Cath: stable anatomy.  . Diabetes mellitus without complication ([place])    a. 12/2014 HbA1c 6.5; b. Rx Glipizide - not taking.  . Family history of premature CAD   . Hx of adenomatous colonic polyps 06/11/2016  . Hyperlipidemia   . Hypertension   . Hypertensive heart disease   . Palpitations   . Reflux   . Tobacco abuse     Current Outpatient Prescriptions on File Prior to Visit  Medication Sig Dispense Refill  . amLODipine (NORVASC) 10 MG tablet Take 1 tablet (10 mg total) by mouth daily. 90 tablet 3  . aspirin 81 MG tablet Take 81 mg by mouth at bedtime.    . chlorthalidone (HYGROTON) 25 MG tablet Take 1 tablet (25 mg total) by mouth daily. 90 tablet 3  . ezetimibe (ZETIA) 10 MG tablet TAKE 1 TABLET BY MOUTH EVERY DAY 30 tablet 2  . hydrALAZINE (APRESOLINE) 100 MG tablet Take 1 tablet (100 mg total) by mouth 3 (three) times daily. 270 tablet 3  . isosorbide mononitrate (IMDUR) 30 MG 24 hr tablet Take 1 tablet (30 mg total) by mouth daily. 90 tablet 3  . metoprolol tartrate (LOPRESSOR) 50  MG tablet TAKE 1 AND 1/2 TABLETS BY MOUTH TWICE A DAY 270 tablet 3  . omega-3 acid ethyl esters (LOVAZA) 1 g capsule Take 2 capsules (2 g total) by mouth 2 (two) times daily. 360 capsule 2  . cetirizine (ZYRTEC) 10 MG tablet Take 1 tablet (10 mg total) by mouth daily. (Patient not taking: Reported on 05/21/2016) 30 tablet 1  . fluticasone (FLONASE) 50 MCG/ACT nasal spray Place 2 sprays into both nostrils daily. (Patient not taking: Reported on 04/03/2016) 16 g 1  . Guaifenesin (MUCINEX MAXIMUM STRENGTH) 1200 MG TB12 Take 1 tablet (1,200 mg total) by mouth every 12 (twelve) hours as needed. (Patient not taking: Reported on 04/03/2016) 14 tablet 1   Current Facility-Administered  Medications on File Prior to Visit  Medication Dose Route Frequency Provider Last Rate Last Dose  . 0.9 %  sodium chloride infusion  500 mL Intravenous Continuous Gatha Mayer, MD        Allergies  Allergen Reactions  . Bupropion     Other reaction(s): Other Suicidal thoughts and increased depression  . Doxazosin Mesylate     Increased heart rate  . Rosuvastatin Other (See Comments)    myalgia  . Simvastatin     myalgias  . Wellbutrin [Bupropion Hcl]     unknown  . Zetia [Ezetimibe]     myalgias    Blood pressure (!) 180/90, pulse (!) 55, weight 227 lb 6.4 oz (103.1 kg), last menstrual period 12/14/2013.  Essential hypertension Blood pressure was significantly elevated today during office visit.  Patient is not feeling well and is not sleeping well either. Patient reports not taking any medications this morning; therefore, BP today may not be accurate. No home BP readings are available today and patient stopped taking doxazosin few weeks ago without discussing therapy of ADR with clinic.  She is already taking maximum daily dose of Lisinopril, hydralazine and amlodipine. Metoprolol is at max tolerated dose with HR fluctuating between 50s to 60s bpm. Will discontinue doxazosin and Lisinopril today. Start Irbesartan 300mg  daily to better BP response and continue all other medication as prescribed. Patient to bring home BP log to next office visit and repeat BMET in 3 weeks. Plan to change metoprolol to carvedilol and/or add spironolactone to therapy if BP remains above desired goal range during next visit.  Tynell Winchell Rodriguez-Guzman PharmD, [place] [address] 09/08/2016 8:57 AM

## 2016-09-08 NOTE — Assessment & Plan Note (Signed)
Blood pressure was significantly elevated today during office visit.  Patient is not feeling well and is not sleeping well either. Patient reports not taking any medications this morning; therefore, BP today may not be accurate. No home BP readings are available today and patient stopped taking doxazosin few weeks ago without discussing therapy of ADR with clinic.  She is already taking maximum daily dose of Lisinopril, hydralazine and amlodipine. Metoprolol is at max tolerated dose with HR fluctuating between 50s to 60s bpm. Will discontinue doxazosin and Lisinopril today. Start Irbesartan 300mg  daily to better BP response and continue all other medication as prescribed. Patient to bring home BP log to next office visit and repeat BMET in 3 weeks. Plan to change metoprolol to carvedilol and/or add spironolactone to therapy if BP remains above desired goal range during next visit.

## 2016-09-08 NOTE — Patient Instructions (Addendum)
Return for a a follow up appointment on 9/19 with DR Gwenlyn Found (HTN clinic 4-5 weeks after if needed)  Your blood pressure today is 180/92 pulse 56   Check your blood pressure at home daily (if able) and keep record of the readings.  Take your BP meds as follows: *STOP taking Doxazosin *STOP taking lisinopril *START Irbesartan 300mg  daily in the morning   Bring your BP cuff and your record of home blood pressures to your next appointment.  Exercise as you're able, try to walk approximately 30 minutes per day.  Keep salt intake to a minimum, especially watch canned and prepared boxed foods.  Eat more fresh fruits and vegetables and fewer canned items.  Avoid eating in fast food restaurants.    HOW TO TAKE YOUR BLOOD PRESSURE: . Rest 5 minutes before taking your blood pressure. .  Don't smoke or drink caffeinated beverages for at least 30 minutes before. . Take your blood pressure before (not after) you eat. . Sit comfortably with your back supported and both feet on the floor (don't cross your legs). . Elevate your arm to heart level on a table or a desk. . Use the proper sized cuff. It should fit smoothly and snugly around your bare upper arm. There should be enough room to slip a fingertip under the cuff. The bottom edge of the cuff should be 1 inch above the crease of the elbow. . Ideally, take 3 measurements at one sitting and record the average.

## 2016-09-08 NOTE — Progress Notes (Signed)
See telephone note 8/29-lmtcb

## 2016-09-08 NOTE — Telephone Encounter (Signed)
-----   Message from Troy Sine, MD sent at 09/01/2016  3:44 PM EDT ----- [name]; please schedule ideally for an in lab CPAP titration study.  The patient has at least mild sleep apnea but had significant oxygen desaturation to 74%.  If insurance denies an in lab CPAP titration, she will then need to be set up for a home auto titration and follow-up sleep clinic

## 2016-09-08 NOTE — Telephone Encounter (Signed)
Left message to call back  CPAP titration scheduled for 10/18.    Message sent to precert

## 2016-09-14 NOTE — Procedures (Signed)
    NAME: [name] DATE OF BIRTH:  1960-10-15 MEDICAL RECORD NUMBER [id number]  LOCATION: [facility]  PHYSICIAN: Shelva Majestic  DATE OF STUDY: 06/02/2016   This procedure was canceled and the patient was rescheduled.    Troy Sine, M.D., [facility],  ELECTRONICALLY SIGNED ON:  09/14/2016, 6:19 PM [place] PH: [phone]   FX: [phone] [place]

## 2016-09-14 NOTE — Progress Notes (Signed)
This encounter was created in error - please disregard.

## 2016-09-16 NOTE — Telephone Encounter (Signed)
Patient aware and verbalized understanding. °

## 2016-09-16 NOTE — Telephone Encounter (Signed)
Left message to call back  

## 2016-09-29 ENCOUNTER — Ambulatory Visit (INDEPENDENT_AMBULATORY_CARE_PROVIDER_SITE_OTHER): Payer: Managed Care, Other (non HMO) | Admitting: Cardiovascular Disease

## 2016-09-29 ENCOUNTER — Encounter: Payer: Self-pay | Admitting: Cardiovascular Disease

## 2016-09-29 DIAGNOSIS — I10 Essential (primary) hypertension: Secondary | ICD-10-CM | POA: Diagnosis not present

## 2016-09-29 DIAGNOSIS — Z720 Tobacco use: Secondary | ICD-10-CM | POA: Diagnosis not present

## 2016-09-29 DIAGNOSIS — I2510 Atherosclerotic heart disease of native coronary artery without angina pectoris: Secondary | ICD-10-CM | POA: Diagnosis not present

## 2016-09-29 DIAGNOSIS — E785 Hyperlipidemia, unspecified: Secondary | ICD-10-CM | POA: Diagnosis not present

## 2016-09-29 NOTE — Assessment & Plan Note (Signed)
History of dyslipidemia intolerant to statin therapy. She does have hypertriglyceridemia on Lovaza. This is followed by her PCP.

## 2016-09-29 NOTE — Assessment & Plan Note (Signed)
History of continued tobacco abuse although markedly improved.

## 2016-09-29 NOTE — Assessment & Plan Note (Signed)
History of noncritical CAD by cardiac catheterization 2010, 2013 and 2014. She denies chest pain or shortness of breath.

## 2016-09-29 NOTE — Assessment & Plan Note (Signed)
History of essential hypertension blood pressure medicine today 190/84. She is on amlodipine, chlorthalidone, hydralazine and metoprolol as well as Avapro. Continue current meds at current dosing. She does check her blood pressure home runs in the 140/80 range.

## 2016-09-29 NOTE — Progress Notes (Signed)
09/29/2016 [name]   Feb 02, 1960  [id number]  Primary Physician Leonie Douglas, PA-C Primary Cardiologist: Lorretta Harp MD Garret [facility], [place], [place]  HPI:  [name] is a 56 y.o. female female presented with a past medical history significant for CAD by cath in 2010 and in April 2013, and 2014.Marland Kitchen She had an abnormal Myoview suggesting anterior ischemia prior to her repeat cath in 2013. Cath has shown 60% LAD stenosis, 50% mid RCA stenosis, 70%, distal RCA stenosis with normal LV function. She has not seen [place] as an OP. 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 She denies vomiting, SOB, or diaphoresis. She denies any abdominal pain or bloating. She went to Urgent Care and was told they could not rule out a heart attack based on her EKG. She was transferred to [facility] ER by ambulance. NTG helped her symptoms en route and she has not had recurrence of chest pain. Her EKG here is normal with NSST changes and her Troponin is negative X 3. She underwent cardiac catheterization by Dr. Debara Pickett on 09/01/12 revealing unchanged anatomy from her prior catheter because of 13 with a 60% RCA lesion and otherwise scattered noncritical disease with normal LV function. Medical therapy was recommended. She has had no recurrent symptoms. She has seen looked totally periodically most recently 03/18/16. She is trying to stop smoking but still continues to smoke sporadically. Her blood pressure medicines have been slowly titrated. She denies chest pain or shortness of breath.   Current Meds  Medication Sig  . amLODipine (NORVASC) 10 MG tablet Take 1 tablet (10 mg total) by mouth daily.  Marland Kitchen aspirin 81 MG tablet Take 81 mg by mouth at bedtime.  . chlorthalidone (HYGROTON) 25 MG tablet Take 1 tablet (25 mg total) by mouth daily.  Marland Kitchen ezetimibe (ZETIA) 10 MG tablet TAKE 1 TABLET BY MOUTH EVERY DAY  . hydrALAZINE (APRESOLINE) 100 MG tablet Take  1 tablet (100 mg total) by mouth 3 (three) times daily.  . irbesartan (AVAPRO) 300 MG tablet Take 1 tablet (300 mg total) by mouth daily.  . isosorbide mononitrate (IMDUR) 30 MG 24 hr tablet Take 1 tablet (30 mg total) by mouth daily.  . metoprolol tartrate (LOPRESSOR) 50 MG tablet TAKE 1 AND 1/2 TABLETS BY MOUTH TWICE A DAY  . omega-3 acid ethyl esters (LOVAZA) 1 g capsule Take 2 capsules (2 g total) by mouth 2 (two) times daily.   Current Facility-Administered Medications for the 09/29/16 encounter (Office Visit) with Lorretta Harp, MD  Medication  . 0.9 %  sodium chloride infusion     Allergies  Allergen Reactions  . Bupropion     Other reaction(s): Other Suicidal thoughts and increased depression  . Doxazosin Mesylate     Increased heart rate  . Rosuvastatin Other (See Comments)    myalgia  . Simvastatin     myalgias  . Wellbutrin [Bupropion Hcl]     unknown  . Zetia [Ezetimibe]     myalgias    Social History   Social History  . Marital status: Divorced    Spouse name: N/A  . Number of children: N/A  . Years of education: N/A   Occupational History  . Payroll [employer]   Social History Main Topics  . Smoking status: Current Every Day Smoker    Packs/day: 0.50    Years: 30.00    Types: Cigarettes  .  Smokeless tobacco: Never Used     Comment: 4-5 cigarettes per day for the past two weeks  . Alcohol use No  . Drug use: No  . Sexual activity: Not Currently   Other Topics Concern  . Not on file   Social History Narrative  . No narrative on file     Review of Systems: General: negative for chills, fever, night sweats or weight changes.  Cardiovascular: negative for chest pain, dyspnea on exertion, edema, orthopnea, palpitations, paroxysmal nocturnal dyspnea or shortness of breath Dermatological: negative for rash Respiratory: negative for cough or wheezing Urologic: negative for hematuria Abdominal: negative for nausea, vomiting, diarrhea, bright  red blood per rectum, melena, or hematemesis Neurologic: negative for visual changes, syncope, or dizziness All other systems reviewed and are otherwise negative except as noted above.    Blood pressure (!) 190/84, pulse 62, height 5\' 4"  (1.626 m), weight 229 lb (103.9 kg), last menstrual period 12/14/2013, SpO2 95 %.  General appearance: alert and no distress Neck: no adenopathy, no carotid bruit, no JVD, supple, symmetrical, trachea midline and thyroid not enlarged, symmetric, no tenderness/mass/nodules Lungs: clear to auscultation bilaterally Heart: regular rate and rhythm, S1, S2 normal, no murmur, click, rub or gallop Extremities: extremities normal, atraumatic, no cyanosis or edema Pulses: 2+ and symmetric Skin: Skin color, texture, turgor normal. No rashes or lesions Neurologic: Alert and oriented X 3, normal strength and tone. Normal symmetric reflexes. Normal coordination and gait  EKG not performed today  ASSESSMENT AND PLAN:   CAD (coronary artery disease) History of noncritical CAD by cardiac catheterization 2010, 2013 and 2014. She denies chest pain or shortness of breath.  Dyslipidemia- low HDL History of dyslipidemia intolerant to statin therapy. She does have hypertriglyceridemia on Lovaza. This is followed by her PCP.  Tobacco abuse History of continued tobacco abuse although markedly improved.  Essential hypertension History of essential hypertension blood pressure medicine today 190/84. She is on amlodipine, chlorthalidone, hydralazine and metoprolol as well as Avapro. Continue current meds at current dosing. She does check her blood pressure home runs in the 140/80 range.      Lorretta Harp MD FACP,FACC,FAHA, [facility] 09/29/2016 4:16 PM

## 2016-09-29 NOTE — Patient Instructions (Signed)
Your physician wants you to follow-up in: 6 MONTHS WITH LUKE KILROY PA You will receive a reminder letter in the mail two months in advance. If you don't receive a letter, please call our office to schedule the follow-up appointment.   Your physician wants you to follow-up in: ONE YEAR WITH DR BERRY You will receive a reminder letter in the mail two months in advance. If you don't receive a letter, please call our office to schedule the follow-up appointment.   If you need a refill on your cardiac medications before your next appointment, please call your pharmacy.  

## 2016-10-16 ENCOUNTER — Ambulatory Visit (INDEPENDENT_AMBULATORY_CARE_PROVIDER_SITE_OTHER): Payer: Managed Care, Other (non HMO) | Admitting: Physician Assistant

## 2016-10-16 ENCOUNTER — Encounter: Payer: Self-pay | Admitting: Physician Assistant

## 2016-10-16 VITALS — BP 150/70 | HR 76 | Temp 97.8°F | Resp 20 | Ht 63.5 in | Wt 230.2 lb

## 2016-10-16 DIAGNOSIS — I10 Essential (primary) hypertension: Secondary | ICD-10-CM

## 2016-10-16 DIAGNOSIS — M7701 Medial epicondylitis, right elbow: Secondary | ICD-10-CM

## 2016-10-16 MED ORDER — IBUPROFEN 800 MG PO TABS: 800.0000 mg | ORAL_TABLET | Freq: Three times a day (TID) | ORAL | 0 refills | Status: DC | PRN

## 2016-10-16 MED ORDER — METOPROLOL SUCCINATE ER 100 MG PO TB24: 100.0000 mg | ORAL_TABLET | Freq: Every day | ORAL | 0 refills | Status: DC

## 2016-10-16 NOTE — Progress Notes (Signed)
[name]  MRN: [id number] DOB: 1960-08-12  PCP: Leonie Douglas, PA-C  Chief Complaint  Patient presents with  . Arm Pain    X 1 week- rt arm swollen/pain    Subjective:  Pt presents to clinic for right medial elbow and swelling that has gotten worse since she noticed it a week ago.  She has had no injury known to her arm.  She has done nothing to help.  Nothing seems to make it better or worse.    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  Right handed.  She is also having problems with her BP - she has seen her cardiologist and they are working with her BP - her f/u is in 6 months.  History is obtained by patient.  Review of Systems  Patient Active Problem List   Diagnosis Date Noted  . Hx of adenomatous colonic polyps 06/11/2016  . Diverticulosis 06/07/2016  . Sleep apnea 03/18/2016  . 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 Hypertensive heart disease   . Hematoma of groin - right, s/p cath; stable on discharge 09/02/2012  . Pleuritic chest pain 08/31/2012  . Obesity (BMI 30-39.9) 08/31/2012  . Renal cyst- on CT in 2002 08/31/2012  . CAD (coronary artery disease) 04/15/2011  . Abnormal stress test, Lexiscan myoview 03/25/2011-sm. anterior ischemia 04/15/2011  . Essential hypertension 04/15/2011  . Dyslipidemia- low HDL 04/15/2011  . Metabolic syndrome 08/65/7846  . Tobacco abuse 04/15/2011  . Family history of premature CAD 04/15/2011    Current Outpatient Prescriptions on File Prior to Visit  Medication Sig Dispense Refill  . amLODipine (NORVASC) 10 MG tablet Take 1 tablet (10 mg total) by mouth daily. 90 tablet 3  . aspirin 81 MG tablet Take 81 mg by mouth at bedtime.    . chlorthalidone (HYGROTON) 25 MG tablet Take 1 tablet (25 mg total) by mouth daily. 90 tablet 3  . hydrALAZINE (APRESOLINE) 100 MG tablet Take 1 tablet (100 mg total) by mouth 3 (three) times daily. 270 tablet 3  . irbesartan (AVAPRO)  300 MG tablet Take 1 tablet (300 mg total) by mouth daily. 30 tablet 1  . isosorbide mononitrate (IMDUR) 30 MG 24 hr tablet Take 1 tablet (30 mg total) by mouth daily. 90 tablet 3  . omega-3 acid ethyl esters (LOVAZA) 1 g capsule Take 2 capsules (2 g total) by mouth 2 (two) times daily. 360 capsule 2   No current facility-administered medications on file prior to visit.     Allergies  Allergen Reactions  . Bupropion     Other reaction(s): Other Suicidal thoughts and increased depression  . Doxazosin Mesylate     Increased heart rate  . Rosuvastatin Other (See Comments)    myalgia  . Simvastatin     myalgias  . Wellbutrin [Bupropion Hcl]     unknown  . Zetia [Ezetimibe]     myalgias    Past Medical History:  Diagnosis Date  . Angina   . CAD (coronary artery disease), non obstructive CAD in 2010    a. 2013 Cath: LAD 60, RCA 4m, 70d; b. 2014 Cath: stable anatomy.  . Diabetes mellitus without complication ([place])    a. 12/2014 HbA1c 6.5; b. Rx Glipizide - not taking.  . Family history of premature CAD   . Hx of adenomatous colonic polyps 06/11/2016  . Hyperlipidemia   . Hypertension   . Hypertensive heart disease   .  Palpitations   . Reflux   . Tobacco abuse    Social History   Social History Narrative  . No narrative on file   Social History  Substance Use Topics  . Smoking status: Current Every Day Smoker    Packs/day: 0.50    Years: 30.00    Types: Cigarettes  . Smokeless tobacco: Never Used     Comment: 4-5 cigarettes per day for the past two weeks  . Alcohol use No   family history includes Cancer in her maternal grandmother; Coronary artery disease in her brother; Heart attack in her maternal grandfather; Heart attack (age of onset: 73) in her maternal aunt; Hypertension in her father and maternal grandfather; Stroke in her father and maternal grandmother.     Objective:  BP (!) 150/70   Pulse 76   Temp 97.8 F (36.6 C) (Oral)   Resp 20   Ht 5' 3.5"  (1.613 m)   Wt 230 lb 3.2 oz (104.4 kg)   LMP 12/14/2013   SpO2 96%   BMI 40.13 kg/m  Body mass index is 40.13 kg/m.  Physical Exam  Constitutional: She is oriented to person, place, and time and well-developed, well-nourished, and in no distress.  HENT:  Head: Normocephalic and atraumatic.  Right Ear: Hearing and external ear normal.  Left Ear: Hearing and external ear normal.  Eyes: Conjunctivae are normal.  Neck: Normal range of motion.  Pulmonary/Chest: Effort normal.  Musculoskeletal:       Right elbow: She exhibits normal range of motion. Tenderness found. Medial epicondyle tenderness noted.       Arms: Pain with wrist flexion that gets worse with resistance, no pain with wrist extension that does not get worse with resistance -- pronation with resistance increases his pain - no pain with supination with resistance  Neurological: She is alert and oriented to person, place, and time. Gait normal.  Skin: Skin is warm and dry.  Psychiatric: Mood, memory, affect and judgment normal.  Vitals reviewed.   Assessment and Plan :  Medial epicondylitis of right elbow - Plan: ibuprofen (ADVIL,MOTRIN) 800 MG tablet - elbow strap with padding to be placed on the medial foream -- ice massage to the area  Essential hypertension - Plan: metoprolol succinate (TOPROL-XL) 100 MG 24 hr tablet - change toprol tartrate to succinate to see if that will help and we also increased her dose to see if we can get better control.  Windell Hummingbird PA-C  [facility] 10/16/2016 3:50 PM

## 2016-10-16 NOTE — Patient Instructions (Addendum)
Tennis elbow strap with a leg or foam pad but placed on the inside of your elbow  Ice massage   IF you received an x-ray today, you will receive an invoice from [facility]. Please contact [facility] at [phone] with questions or concerns regarding your invoice.   IF you received labwork today, you will receive an invoice from [facility]. Please contact [facility] at [phone] with questions or concerns regarding your invoice.   Our billing staff will not be able to assist you with questions regarding bills from these companies.  You will be contacted with the lab results as soon as they are available. The fastest way to get your results is to activate your My Chart account. Instructions are located on the last page of this paperwork. If you have not heard from [place] regarding the results in 2 weeks, please contact this office.    Golfer's Elbow Golfer's elbow, also called medial epicondylitis, is a condition that results from inflammation of the strong bands of tissue (tendons) that attach your forearm muscles to the inside of your bone at the elbow. These tendons affect the muscles that bend the palm toward the wrist (flexion). This condition is called golfer's elbow because it is more common among people who constantly bend and twist their wrists, such as golfers. This injury usually results from overuse. Tendons also become less flexible with age. This condition causes elbow pain that may spread to your forearm and upper arm. The pain may get worse when you bend your wrist downward. What are the causes? This condition is an overuse injury that is caused by:  Repeatedly flexing, turning, or twisting your wrist.  Constantly gripping objects with your hands.  What increases the risk? This condition is more likely to develop in people who play golf or tennis or have jobs that require the constant use of their hands. This injury is more common  among:  Carpenters.  Gardeners.  Musicians.  Bricklayers.  Typists.  What are the signs or symptoms? Symptoms of this condition include:  Pain near the inner elbow or forearm.  Reduced grip strength.  How is this diagnosed? This condition is diagnosed based on your symptoms, medical history, and physical exam. During the exam, your health care provider may test your grip strength and move your wrist to check for pain. You may also have an MRI to confirm the diagnosis, look for other issues, and check for tears in the ligaments, muscles, or tendons. How is this treated? Treatment for this condition includes:  Stopping all activities that make you bend or twist your wrist until your pain and other symptoms go away.  Icing your wrist to relieve pain.  Taking NSAIDs or getting corticosteroid injections to reduce pain and swelling.  Doing stretches, range-of-motion, and strengthening exercises (physical therapy) as told by your health care provider.  In rare cases, surgery may be needed if your condition does not improve. Follow these instructions at home:  If directed, apply ice to the injured area. ? Put ice in a plastic bag. ? Place a towel between your skin and the bag. ? Leave the ice on for 20 minutes, 2-3 times a day.  Move your fingers often to avoid stiffness.  Raise (elevate) the injured area above the level of your heart while you are sitting or lying down.  Return to your normal activities as told by your health care provider. Ask your health care provider what activities are safe for you.  Do exercises  as told by your health care provider.  Do not use tobacco products, including cigarettes, chewing tobacco, or e-cigarettes. If you need help quitting, ask your health care provider.  Take over-the-counter and prescription medicines only as told by your health care provider.  Keep all follow-up visits as told by your health care provider. This is  important. How is this prevented?  Warm up and stretch before being active.  Cool down and stretch after being active.  Give your body time to rest between periods of activity.  Make sure to use equipment that fits you.  Be safe and responsible while being active to avoid falls.  Do at least 150 minutes of moderate-intensity exercise each week, such as brisk walking or water aerobics.  Maintain physical fitness, including: ? Strength. ? Flexibility. ? Cardiovascular fitness. ? Endurance.  Perform exercises to strengthen the forearm muscles.  Slow your golf swing to reduce shock in the arm when making contact with the ball, if you play golf. Contact a health care provider if:  Your pain does not improve or it gets worse.  You notice numbness in your hand. Get help right away if:  Your pain is severe.  You cannot move your wrist. This information is not intended to replace advice given to you by your health care provider. Make sure you discuss any questions you have with your health care provider. Document Released: 12/28/2004 Document Revised: 09/02/2015 Document Reviewed: 09/09/2014 Elsevier Interactive Patient Education  [employer].

## 2016-10-22 ENCOUNTER — Other Ambulatory Visit: Payer: Self-pay | Admitting: Cardiovascular Disease

## 2016-10-22 NOTE — Telephone Encounter (Signed)
REFILL 

## 2016-10-26 ENCOUNTER — Telehealth: Payer: Self-pay | Admitting: *Deleted

## 2016-10-26 NOTE — Telephone Encounter (Signed)
-----   Message from Almyra Free sent at 10/26/2016  2:30 PM EDT ----- Regarding: Need to reschedule I am going to have to resubmit and send additional information. This could take another week to 10 days. I'm sorry for the delay. Thanks, Amy

## 2016-10-26 NOTE — Telephone Encounter (Signed)
Attempt to call patient to notify sleep study needs to be rescheduled, left detailed message (ok per DPR) to make patient aware.   Request call back to confirm message was received.    Attempt to call [facility] to reschedule x 2, no answer.  Will reattempt.

## 2016-10-27 NOTE — Telephone Encounter (Signed)
Spoke to patient, aware CPAP titration has been canceled and will need to be rescheduled once approved.

## 2016-10-27 NOTE — Telephone Encounter (Signed)
CPAP titration canceled for 10/18, sleep clinic is aware.

## 2016-10-28 ENCOUNTER — Encounter (HOSPITAL_BASED_OUTPATIENT_CLINIC_OR_DEPARTMENT_OTHER): Payer: Managed Care, Other (non HMO)

## 2016-11-02 ENCOUNTER — Other Ambulatory Visit: Payer: Self-pay | Admitting: *Deleted

## 2016-11-02 MED ORDER — IRBESARTAN 300 MG PO TABS: 300.0000 mg | ORAL_TABLET | Freq: Every day | ORAL | 2 refills | Status: DC

## 2016-11-08 ENCOUNTER — Other Ambulatory Visit: Payer: Self-pay

## 2016-11-08 MED ORDER — CHLORTHALIDONE 25 MG PO TABS: 25.0000 mg | ORAL_TABLET | Freq: Every day | ORAL | 3 refills | Status: DC

## 2016-11-13 ENCOUNTER — Other Ambulatory Visit: Payer: Self-pay | Admitting: Cardiovascular Disease

## 2016-11-15 NOTE — Telephone Encounter (Signed)
REFILL 

## 2016-11-18 ENCOUNTER — Other Ambulatory Visit: Payer: Self-pay | Admitting: Physician Assistant

## 2016-11-18 DIAGNOSIS — M7701 Medial epicondylitis, right elbow: Secondary | ICD-10-CM

## 2016-12-11 ENCOUNTER — Other Ambulatory Visit: Payer: Self-pay | Admitting: Physician Assistant

## 2016-12-11 DIAGNOSIS — I10 Essential (primary) hypertension: Secondary | ICD-10-CM

## 2017-02-22 ENCOUNTER — Other Ambulatory Visit: Payer: Self-pay

## 2017-02-22 ENCOUNTER — Encounter: Payer: Self-pay | Admitting: Family Medicine

## 2017-02-22 ENCOUNTER — Ambulatory Visit (INDEPENDENT_AMBULATORY_CARE_PROVIDER_SITE_OTHER): Payer: Managed Care, Other (non HMO) | Admitting: Family Medicine

## 2017-02-22 VITALS — BP 132/80 | HR 74 | Temp 98.8°F | Ht 64.0 in | Wt 230.0 lb

## 2017-02-22 DIAGNOSIS — J069 Acute upper respiratory infection, unspecified: Secondary | ICD-10-CM | POA: Diagnosis not present

## 2017-02-22 MED ORDER — AZELASTINE HCL 0.1 % NA SOLN: 1.0000 | Freq: Two times a day (BID) | NASAL | 0 refills | Status: DC

## 2017-02-22 NOTE — Progress Notes (Signed)
2/12/201911:21 AM  [name] September 25, 1960, 57 y.o. female [id number]  Chief Complaint  Patient presents with  . Cough    HAS BEEN SICK SINCE FRIDAY, NAUSEA  AND CONGESTION    HPI:   Patient is a 57 y.o. female with past medical history significant for CAD, DM2 and HTN who presents today for 5 days of nasal congestion, cough, post nasal drip and nausea. No fever, chills, SOB, vomiting, diarrhea.   Depression screen [facility] 2/9 02/22/2017 10/16/2016 04/03/2016  Decreased Interest 0 0 0  Down, Depressed, Hopeless 0 0 0  PHQ - 2 Score 0 0 0    Allergies  Allergen Reactions  . Bupropion     Other reaction(s): Other Suicidal thoughts and increased depression  . Doxazosin Mesylate     Increased heart rate  . Rosuvastatin Other (See Comments)    myalgia  . Simvastatin     myalgias  . Wellbutrin [Bupropion Hcl]     unknown  . Zetia [Ezetimibe]     myalgias    Prior to Admission medications   Medication Sig Start Date End Date Taking? Authorizing Provider  amLODipine (NORVASC) 10 MG tablet Take 1 tablet (10 mg total) by mouth daily. 03/18/16  Yes Kilroy, Doreene [place], PA-C  aspirin 81 MG tablet Take 81 mg by mouth at bedtime.   Yes [provider]  chlorthalidone (HYGROTON) 25 MG tablet Take 1 tablet (25 mg total) by mouth daily. 11/08/16  Yes Lorretta Harp, MD  hydrALAZINE (APRESOLINE) 100 MG tablet Take 1 tablet (100 mg total) by mouth 3 (three) times daily. 07/27/16  Yes Lorretta Harp, MD  ibuprofen (ADVIL,MOTRIN) 800 MG tablet Take 1 tablet (800 mg total) by mouth every 8 (eight) hours as needed. 10/16/16  Yes Weber, Sarah L, PA-C  irbesartan (AVAPRO) 300 MG tablet Take 1 tablet (300 mg total) by mouth daily. 11/02/16  Yes Lorretta Harp, MD  irbesartan (AVAPRO) 300 MG tablet TAKE 1 TABLET BY MOUTH EVERY DAY 11/15/16  Yes Lorretta Harp, MD  isosorbide mononitrate (IMDUR) 30 MG 24 hr tablet Take 1 tablet (30 mg total) by mouth daily. 01/09/16  Yes Kilroy, Luke K,  PA-C  metoprolol succinate (TOPROL-XL) 100 MG 24 hr tablet Take 1 tablet (100 mg total) by mouth daily. Take with or immediately following a meal.  Ov needed for refills 12/13/16  Yes Weber, Sarah L, PA-C  omega-3 acid ethyl esters (LOVAZA) 1 g capsule Take 2 capsules (2 g total) by mouth 2 (two) times daily. 02/10/16  Yes Lorretta Harp, MD  doxazosin (CARDURA) 2 MG tablet TAKE 1 TABLET BY MOUTH EVERYDAY AT BEDTIME Patient not taking: Reported on 02/22/2017 10/22/16   Lorretta Harp, MD    Past Medical History:  Diagnosis Date  . Angina   . CAD (coronary artery disease), non obstructive CAD in 2010    a. 2013 Cath: LAD 60, RCA 86m, 70d; b. 2014 Cath: stable anatomy.  . Diabetes mellitus without complication ([place])    a. 12/2014 HbA1c 6.5; b. Rx Glipizide - not taking.  . Family history of premature CAD   . Hx of adenomatous colonic polyps 06/11/2016  . Hyperlipidemia   . Hypertension   . Hypertensive heart disease   . Palpitations   . Reflux   . Tobacco abuse     Past Surgical History:  Procedure Laterality Date  . APPENDECTOMY  1974  . CARDIAC CATHETERIZATION  02/07/2008   Recommendation-F/U stress test  .  CARDIAC CATHETERIZATION  04/16/2011   Recommendation-Increase medical therapy trial  . CARDIOVASCULAR STRESS TEST  03/25/2011   Suspect subtle anterior septal ischemia  . FRACTURE SURGERY Left 1972   forearm fracture  . LEFT HEART CATHETERIZATION WITH CORONARY ANGIOGRAM N/A 04/16/2011   Procedure: LEFT HEART CATHETERIZATION WITH CORONARY ANGIOGRAM;  Surgeon: Troy Sine, MD;  Location: [facility] CATH LAB;  Service: Cardiovascular;  Laterality: N/A;  . LEFT HEART CATHETERIZATION WITH CORONARY ANGIOGRAM N/A 09/01/2012   Procedure: LEFT HEART CATHETERIZATION WITH CORONARY ANGIOGRAM;  Surgeon: Pixie Casino, MD;  Location: [facility] CATH LAB;  Service: Cardiovascular;  Laterality: N/A;  . RENAL DOPPLER  06/05/2009   No evidence of significant diameter reduction  . TRANSTHORACIC  ECHOCARDIOGRAM  01/25/2008   EF 60-65%, Moderate concentric LVH  . WRIST FRACTURE SURGERY Right ~ 1996    Social History   Tobacco Use  . Smoking status: Current Every Day Smoker    Packs/day: 0.50    Years: 30.00    Pack years: 15.00    Types: Cigarettes  . Smokeless tobacco: Never Used  . Tobacco comment: 4-5 cigarettes per day for the past two weeks  Substance Use Topics  . Alcohol use: No    Alcohol/week: 0.0 oz    Family History  Problem Relation Age of Onset  . Stroke Father   . Hypertension Father   . Coronary artery disease Brother   . Stroke Maternal Grandmother   . Cancer Maternal Grandmother        Breast cancer  . Hypertension Maternal Grandfather   . Heart attack Maternal Grandfather   . Heart attack Maternal Aunt 41  . Colon cancer Neg Hx   . Stomach cancer Neg Hx     ROS Per hpi  OBJECTIVE:  Blood pressure 132/80, pulse 74, temperature 98.8 F (37.1 C), temperature source Oral, height 5\' 4"  (1.626 m), weight 230 lb (104.3 kg), last menstrual period 12/14/2013, SpO2 95 %.  Physical Exam  Constitutional: She is oriented to person, place, and time and well-developed, well-nourished, and in no distress.  HENT:  Head: Normocephalic and atraumatic.  Right Ear: Hearing, tympanic membrane, external ear and ear canal normal.  Left Ear: Hearing, tympanic membrane, external ear and ear canal normal.  Mouth/Throat: Oropharynx is clear and moist.  Eyes: EOM are normal. Pupils are equal, round, and reactive to light.  Neck: Neck supple.  Cardiovascular: Normal rate, regular rhythm and normal heart sounds. Exam reveals no gallop and no friction rub.  No murmur heard. Pulmonary/Chest: Effort normal and breath sounds normal. She has no wheezes. She has no rales.  Lymphadenopathy:    She has no cervical adenopathy.  Neurological: She is alert and oriented to person, place, and time. Gait normal.  Skin: Skin is warm and dry.     ASSESSMENT and PLAN  1. URI  with cough and congestion Discussed supportive measures for: increase hydration, rest, OTC medications, etc. RTC precautions discussed.  - azelastine (ASTELIN) 0.1 % nasal spray; Place 1 spray into both nostrils 2 (two) times daily. Use in each nostril as directed  Return if symptoms worsen or fail to improve.    Rutherford Guys, MD [facility] at [place], [place] [address] Ph.  [phone] Fax [phone]

## 2017-02-22 NOTE — Patient Instructions (Addendum)
1. Start mucinex-DM, use humidifier at night. Stop nyquil. Use ibuprofen as needed for pain. 2. Push fluids, warm teas with honey   Upper Respiratory Infection, Adult Most upper respiratory infections (URIs) are caused by a virus. A URI affects the nose, throat, and upper air passages. The most common type of URI is often called "the common cold." Follow these instructions at home:  Take medicines only as told by your doctor.  Gargle warm saltwater or take cough drops to comfort your throat as told by your doctor.  Use a warm mist humidifier or inhale steam from a shower to increase air moisture. This may make it easier to breathe.  Drink enough fluid to keep your pee (urine) clear or pale yellow.  Eat soups and other clear broths.  Have a healthy diet.  Rest as needed.  Go back to work when your fever is gone or your doctor says it is okay. ? You may need to stay home longer to avoid giving your URI to others. ? You can also wear a face mask and wash your hands often to prevent spread of the virus.  Use your inhaler more if you have asthma.  Do not use any tobacco products, including cigarettes, chewing tobacco, or electronic cigarettes. If you need help quitting, ask your doctor. Contact a doctor if:  You are getting worse, not better.  Your symptoms are not helped by medicine.  You have chills.  You are getting more short of breath.  You have brown or red mucus.  You have yellow or brown discharge from your nose.  You have pain in your face, especially when you bend forward.  You have a fever.  You have puffy (swollen) neck glands.  You have pain while swallowing.  You have white areas in the back of your throat. Get help right away if:  You have very bad or constant: ? Headache. ? Ear pain. ? Pain in your forehead, behind your eyes, and over your cheekbones (sinus pain). ? Chest pain.  You have long-lasting (chronic) lung disease and any of the  following: ? Wheezing. ? Long-lasting cough. ? Coughing up blood. ? A change in your usual mucus.  You have a stiff neck.  You have changes in your: ? Vision. ? Hearing. ? Thinking. ? Mood. This information is not intended to replace advice given to you by your health care provider. Make sure you discuss any questions you have with your health care provider. Document Released: 06/16/2007 Document Revised: 08/31/2015 Document Reviewed: 04/04/2013 Elsevier Interactive Patient Education  2018 [employer].     IF you received an x-ray today, you will receive an invoice from [facility]. Please contact [facility] Radiology at [phone] with questions or concerns regarding your invoice.   IF you received labwork today, you will receive an invoice from [facility]. Please contact [facility] at [phone] with questions or concerns regarding your invoice.   Our billing staff will not be able to assist you with questions regarding bills from these companies.  You will be contacted with the lab results as soon as they are available. The fastest way to get your results is to activate your My Chart account. Instructions are located on the last page of this paperwork. If you have not heard from [place] regarding the results in 2 weeks, please contact this office.

## 2017-03-02 ENCOUNTER — Encounter: Payer: Self-pay | Admitting: Family Medicine

## 2017-03-03 ENCOUNTER — Encounter: Payer: Self-pay | Admitting: Physician Assistant

## 2017-03-11 ENCOUNTER — Encounter: Payer: Self-pay | Admitting: Physician Assistant

## 2017-03-17 ENCOUNTER — Encounter: Payer: Self-pay | Admitting: Physician Assistant

## 2017-03-17 ENCOUNTER — Other Ambulatory Visit: Payer: Self-pay | Admitting: Radiology

## 2017-03-21 ENCOUNTER — Other Ambulatory Visit: Payer: Self-pay | Admitting: Family Medicine

## 2017-03-21 NOTE — Telephone Encounter (Signed)
azelastine refill Last OV: 02/22/17 Last Refill:02/22/17 Pharmacy:[facility] [address]  PCP: Tenna Delaine PA

## 2017-04-04 ENCOUNTER — Other Ambulatory Visit: Payer: Self-pay | Admitting: Cardiology

## 2017-04-30 ENCOUNTER — Other Ambulatory Visit: Payer: Self-pay | Admitting: Cardiovascular Disease

## 2017-05-03 ENCOUNTER — Telehealth: Payer: Self-pay | Admitting: *Deleted

## 2017-05-03 MED ORDER — OMEGA-3-ACID ETHYL ESTERS 1 G PO CAPS: 2.0000 | ORAL_CAPSULE | Freq: Two times a day (BID) | ORAL | 0 refills | Status: DC

## 2017-05-03 NOTE — Telephone Encounter (Signed)
Rx has been sent to the pharmacy electronically. ° °

## 2017-06-27 ENCOUNTER — Other Ambulatory Visit: Payer: Self-pay | Admitting: Cardiovascular Disease

## 2017-07-26 ENCOUNTER — Other Ambulatory Visit: Payer: Self-pay | Admitting: Cardiovascular Disease

## 2017-07-30 ENCOUNTER — Other Ambulatory Visit: Payer: Self-pay | Admitting: Cardiovascular Disease

## 2017-08-01 NOTE — Telephone Encounter (Signed)
Rx sent to pharmacy   

## 2017-08-23 ENCOUNTER — Ambulatory Visit (INDEPENDENT_AMBULATORY_CARE_PROVIDER_SITE_OTHER): Payer: Managed Care, Other (non HMO) | Admitting: Urgent Care

## 2017-08-23 ENCOUNTER — Encounter: Payer: Self-pay | Admitting: Urgent Care

## 2017-08-23 ENCOUNTER — Ambulatory Visit (INDEPENDENT_AMBULATORY_CARE_PROVIDER_SITE_OTHER): Payer: Managed Care, Other (non HMO)

## 2017-08-23 VITALS — BP 182/75 | HR 62 | Temp 98.6°F | Resp 18 | Ht 64.0 in | Wt 225.2 lb

## 2017-08-23 DIAGNOSIS — R739 Hyperglycemia, unspecified: Secondary | ICD-10-CM

## 2017-08-23 DIAGNOSIS — R05 Cough: Secondary | ICD-10-CM

## 2017-08-23 DIAGNOSIS — I2510 Atherosclerotic heart disease of native coronary artery without angina pectoris: Secondary | ICD-10-CM | POA: Diagnosis not present

## 2017-08-23 DIAGNOSIS — I10 Essential (primary) hypertension: Secondary | ICD-10-CM

## 2017-08-23 DIAGNOSIS — J029 Acute pharyngitis, unspecified: Secondary | ICD-10-CM | POA: Diagnosis not present

## 2017-08-23 DIAGNOSIS — Z720 Tobacco use: Secondary | ICD-10-CM

## 2017-08-23 DIAGNOSIS — R1013 Epigastric pain: Secondary | ICD-10-CM | POA: Diagnosis not present

## 2017-08-23 DIAGNOSIS — J3489 Other specified disorders of nose and nasal sinuses: Secondary | ICD-10-CM

## 2017-08-23 DIAGNOSIS — R0981 Nasal congestion: Secondary | ICD-10-CM

## 2017-08-23 DIAGNOSIS — R030 Elevated blood-pressure reading, without diagnosis of hypertension: Secondary | ICD-10-CM

## 2017-08-23 DIAGNOSIS — R0902 Hypoxemia: Secondary | ICD-10-CM

## 2017-08-23 DIAGNOSIS — I119 Hypertensive heart disease without heart failure: Secondary | ICD-10-CM | POA: Diagnosis not present

## 2017-08-23 DIAGNOSIS — R058 Other specified cough: Secondary | ICD-10-CM

## 2017-08-23 LAB — COMPREHENSIVE METABOLIC PANEL
ALK PHOS: 45 IU/L (ref 39–117)
ALT: 37 IU/L — AB (ref 0–32)
AST: 29 IU/L (ref 0–40)
Albumin/Globulin Ratio: 1.4 (ref 1.2–2.2)
Albumin: 4 g/dL (ref 3.5–5.5)
BILIRUBIN TOTAL: 0.3 mg/dL (ref 0.0–1.2)
BUN / CREAT RATIO: 12 (ref 9–23)
BUN: 21 mg/dL (ref 6–24)
CHLORIDE: 101 mmol/L (ref 96–106)
CO2: 23 mmol/L (ref 20–29)
Calcium: 9.2 mg/dL (ref 8.7–10.2)
Creatinine, Ser: 1.73 mg/dL — ABNORMAL HIGH (ref 0.57–1.00)
GFR calc non Af Amer: 33 mL/min/{1.73_m2} — ABNORMAL LOW (ref >59)
GFR, EST AFRICAN AMERICAN: 38 mL/min/{1.73_m2} — AB (ref >59)
Globulin, Total: 2.9 g/dL (ref 1.5–4.5)
Glucose: 154 mg/dL — ABNORMAL HIGH (ref 65–99)
POTASSIUM: 3.8 mmol/L (ref 3.5–5.2)
Sodium: 140 mmol/L (ref 134–144)
TOTAL PROTEIN: 6.9 g/dL (ref 6.0–8.5)

## 2017-08-23 LAB — POCT RAPID STREP A (OFFICE): RAPID STREP A SCREEN: NEGATIVE

## 2017-08-23 LAB — CBC
Hematocrit: 43.1 % (ref 34.0–46.6)
Hemoglobin: 13.9 g/dL (ref 11.1–15.9)
MCH: 27.8 pg (ref 26.6–33.0)
MCHC: 32.3 g/dL (ref 31.5–35.7)
MCV: 86 fL (ref 79–97)
PLATELETS: 264 10*3/uL (ref 150–450)
RBC: 5 x10E6/uL (ref 3.77–5.28)
RDW: 14.5 % (ref 12.3–15.4)
WBC: 8.6 10*3/uL (ref 3.4–10.8)

## 2017-08-23 LAB — HEMOGLOBIN A1C
Est. average glucose Bld gHb Est-mCnc: 134 mg/dL
Hgb A1c MFr Bld: 6.3 % — ABNORMAL HIGH (ref 4.8–5.6)

## 2017-08-23 MED ORDER — IPRATROPIUM BROMIDE 0.02 % IN SOLN
0.5000 mg | Freq: Once | RESPIRATORY_TRACT | Status: AC
  Administered 2017-08-23: 0.5 mg via RESPIRATORY_TRACT

## 2017-08-23 MED ORDER — ALBUTEROL SULFATE (2.5 MG/3ML) 0.083% IN NEBU
2.5000 mg | INHALATION_SOLUTION | Freq: Once | RESPIRATORY_TRACT | Status: AC
  Administered 2017-08-23: 2.5 mg via RESPIRATORY_TRACT

## 2017-08-23 MED ORDER — BENZONATATE 100 MG PO CAPS: 100.0000 mg | ORAL_CAPSULE | Freq: Three times a day (TID) | ORAL | 0 refills | Status: DC | PRN

## 2017-08-23 MED ORDER — ALBUTEROL SULFATE HFA 108 (90 BASE) MCG/ACT IN AERS: 2.0000 | INHALATION_SPRAY | RESPIRATORY_TRACT | 1 refills | Status: DC | PRN

## 2017-08-23 MED ORDER — METHYLPREDNISOLONE ACETATE 80 MG/ML IJ SUSP
80.0000 mg | Freq: Once | INTRAMUSCULAR | Status: AC
  Administered 2017-08-23: 80 mg via INTRAMUSCULAR

## 2017-08-23 NOTE — Patient Instructions (Addendum)
Please make sure that you contact Dr. Alvester Chou to set up a follow-up appointment as soon as possible. For sore throat try using a honey-based tea. Use 3 teaspoons of honey with juice squeezed from half lemon. Place shaved pieces of ginger into 1/2-1 cup of water and warm over stove top. Then mix the ingredients and repeat every 4 hours as needed. If you develop chest pain, shortness of breath, difficulty breathing tonight then please report to the emergency room. Otherwise, we will have careful and close follow up with you tomorrow.    Hypoxia Hypoxia is a condition that happens when there is a lack of oxygen in the body's tissues and organs. When there is not enough oxygen, organs cannot work as they should. This causes serious problems throughout the body and in the brain. What are the causes? This condition may be caused by:  Exposure to high altitude.  A collapsed lung (pneumothorax).  Lung infection (pneumonia).  Lung injury.  Long-term (chronic) lung disease, such as COPD (chronic obstructive pulmonary disease).  Blood collecting in the chest cavity (hemothorax).  Food, saliva, or vomit getting into the airway (aspiration).  Reduced blood flow (ischemia).  Severe blood loss.  Slow or shallow breathing (hypoventilation).  Blood disorders, such as anemia.  Carbon monoxide poisoning.  The heart suddenly stopping (cardiac arrest).  Anesthetic medicines.  Drowning.  Choking.  What are the signs or symptoms? Symptoms of this condition include:  Headache.  Fatigue.  Drowsiness.  Forgetfulness.  Nausea.  Confusion.  Shortness of breath.  Dizziness.  Bluish color of the skin, lips, or nail beds (cyanosis).  Change in consciousness or awareness.  If hypoxia is not treated, it can lead to convulsions, loss of consciousness (coma), or brain damage. How is this diagnosed? This condition may be diagnosed based on:  A physical exam.  Blood tests.  A test  that measures how much oxygen is in your blood (pulse oximetry). This is done with a sensor that is placed on your finger, toe, or earlobe.  Chest X-ray.  Tests to check your lung function (pulmonary function tests).  A test to check the electrical activity of your heart (electrocardiogram, ECG).  You may have other tests to determine the cause of your hypoxia. How is this treated? Treatment for this condition depends on what is causing the hypoxia. You will likely be treated with oxygen therapy. This may be done by giving you oxygen through a face mask or through tubes in your nose. Your health care provider may also recommend other therapies to treat the underlying cause of your hypoxia. Follow these instructions at home:  Take over-the-counter and prescription medicines only as told by your health care provider.  Do not use any products that contain nicotine or tobacco, such as cigarettes and e-cigarettes. If you need help quitting, ask your health care provider.  Avoid secondhand smoke.  Work with your health care provider to manage any chronic conditions you have that may be causing hypoxia, such as COPD.  Keep all follow-up visits as told by your health care provider. This is important. Contact a health care provider if:  You have a fever.  You have trouble breathing, even after treatment.  You become extremely short of breath when you exercise. Get help right away if:  Your shortness of breath gets worse, especially with normal or very little activity.  Your skin, lips, or nail beds have a bluish color.  You become confused or you cannot think properly.  You have chest pain. Summary  Hypoxia is a condition that happens when there is a lack of oxygen in the body's tissues and organs.  If hypoxia is not treated, it can lead to convulsions, loss of consciousness (coma), or brain damage.  Symptoms of hypoxia can include a headache, shortness of breath, confusion,  nausea, and a bluish skin color.  Hypoxia has many possible causes, including exposure to high altitude, carbon monoxide poisoning, or other health issues, such as blood disorders or cardiac arrest.  Hypoxia is usually treated with oxygen therapy. This information is not intended to replace advice given to you by your health care provider. Make sure you discuss any questions you have with your health care provider. Document Released: 02/16/2016 Document Revised: 02/16/2016 Document Reviewed: 02/16/2016 Elsevier Interactive Patient Education  2018 [employer].     IF you received an x-ray today, you will receive an invoice from [facility] Rad[facility]. Please contact [facility] Radiology at [phone] with questions or concerns regarding your invoice.   IF you received labwork today, you will receive an invoice from [facility]. Please contact [facility] at [phone] with questions or concerns regarding your invoice.   Our billing staff will not be able to assist you with questions regarding bills from these companies.  You will be contacted with the lab results as soon as they are available. The fastest way to get your results is to activate your My Chart account. Instructions are located on the last page of this paperwork. If you have not heard from [place] regarding the results in 2 weeks, please contact this office.

## 2017-08-23 NOTE — Progress Notes (Signed)
MRN: [id number] DOB: 03-Aug-1960  Subjective:   [name] is a 57 y.o. female presenting for 1 day history of sore throat, dry cough, sinus pain, sinus congestion, intermittent abdominal pain over epigastric area with nausea, aching, left sided neck pain, red eyes, eye swelling. Patient reports that the belly pain is more longstanding. Tried Mucinex for relief. Denies fever, chest pain, hemoptysis, shob, vomiting, diaphoresis. Has a history of CAD, HTN, is an active smoker, smokes 1ppd.  Has had heart catheterization in 2010, April 2013 in 2014.  The results of shown 60% LAD stenosis, 50% mid RCA stenosis, 70% distal RCA stenosis with normal LV function.  Last office visit with Dr. Alvester Chou was 09/29/2016, was cleared for follow up in 1 year. She is taking amlodipine, chlorthalidone, irbesartan, hydralazine. Denies history of MI. Has not needed to use Imdur lately. Denies history of allergies, does not take allergy medications. Denies history of asthma, COPD, does not use any inhalers.    [name] has a current medication list which includes the following prescription(s): amlodipine, aspirin, azelastine, chlorthalidone, hydralazine, ibuprofen, irbesartan, isosorbide mononitrate, metoprolol succinate, and omega-3 acid ethyl esters. Also is allergic to bupropion; doxazosin mesylate; rosuvastatin; simvastatin; wellbutrin [bupropion hcl]; and zetia [ezetimibe].   [name]  has a past medical history of Angina, CAD (coronary artery disease), non obstructive CAD in 2010, Diabetes mellitus without complication ([place]), Family history of premature CAD, adenomatous colonic polyps (06/11/2016), Hyperlipidemia, Hypertension, Hypertensive heart disease, Palpitations, Reflux, and Tobacco abuse. Also  has a past surgical history that includes Wrist fracture surgery (Right, ~ 1996); RENAL DOPPLER (06/05/2009); Cardiac catheterization (02/07/2008); Cardiac catheterization (04/16/2011); Cardiovascular stress test (03/25/2011);  transthoracic echocardiogram (01/25/2008); left heart catheterization with coronary angiogram (N/A, 04/16/2011); left heart catheterization with coronary angiogram (N/A, 09/01/2012); Fracture surgery (Left, 1972); and Appendectomy (1974).   Her family history includes Cancer in her maternal grandmother; Coronary artery disease in her brother; Heart attack in her maternal grandfather; Heart attack (age of onset: 68) in her maternal aunt; Hypertension in her father and maternal grandfather; Stroke in her father and maternal grandmother.   Immunization History  Administered Date(s) Administered  . Influenza-Unspecified 11/29/2014  . Pneumococcal Polysaccharide-23 08/31/2012  . Tdap 04/03/2016    Objective:   Vitals: BP (!) 182/75   Pulse 62   Temp 98.6 F (37 C) (Oral)   Resp 18   Ht 5\' 4"  (1.626 m)   Wt 225 lb 3.2 oz (102.2 kg)   LMP 12/14/2013   SpO2 95%   BMI 38.66 kg/m   BP Readings from Last 3 Encounters:  08/23/17 (!) 182/75  02/22/17 132/80  10/16/16 (!) 150/70    Physical Exam  Constitutional: She is oriented to person, place, and time. She appears well-developed and well-nourished.  HENT:  Right Ear: Tympanic membrane normal.  Left Ear: Tympanic membrane normal.  Nose: Mucosal edema, rhinorrhea and sinus tenderness (frontal) present.  Mouth/Throat: Oropharynx is clear and moist. No oropharyngeal exudate.  Eyes: Pupils are equal, round, and reactive to light. EOM are normal. Right eye exhibits no discharge. Left eye exhibits no discharge. Right conjunctiva is injected. Left conjunctiva is injected. No scleral icterus.  Neck: Normal range of motion. Neck supple.  Cardiovascular: Normal rate, regular rhythm, normal heart sounds and intact distal pulses. Exam reveals no gallop and no friction rub.  No murmur heard. No carotid bruits.    Pulmonary/Chest: Effort normal and breath sounds normal. No stridor. No respiratory distress. She has no wheezes. She has no rales.  Abdominal: Soft. Bowel sounds are normal. She exhibits no distension and no mass. There is tenderness (epigastric). There is no rebound and no guarding.  Musculoskeletal: She exhibits no edema.  Lymphadenopathy:    She has cervical adenopathy (left, anterior, superior).  Neurological: She is alert and oriented to person, place, and time.  Skin: Skin is warm and dry.  Psychiatric: She has a normal mood and affect.   Dg Chest 2 View  Result Date: 08/23/2017 CLINICAL DATA:  Cough.  Hypoxia.  Current smoker. EXAM: CHEST - 2 VIEW COMPARISON:  CTA chest 12/18/2014. Chest x-rays 12/18/2014, 08/30/2012 and earlier. FINDINGS: Cardiac silhouette upper normal in size. Thoracic aorta mildly atherosclerotic. Hilar and mediastinal contours otherwise unremarkable. Two nodules projected over or within the RIGHT UPPER LOBE which are very easily visible despite their small size, shown on prior examinations to represent calcified granulomas. Lungs otherwise clear. Bronchovascular markings normal. Pulmonary vascularity normal. No confluent airspace consolidation. No pleural effusions. Mild degenerative changes involving the thoracic and UPPER lumbar spine. IMPRESSION: 1. Borderline heart size.  No acute cardiopulmonary disease. 2. Old granulomatous disease. Electronically Signed   By: Evangeline Dakin M.D.   On: 08/23/2017 09:33   ECG interpretation - T-wave flattening in lead II, T wave inversion in leads I, aVL, V5 and V6.  She is otherwise in sinus rhythm at 62 bpm.  ECG very comparable to that from 03/22/2016.  Reviewed with Dr. Carlota Raspberry.  Results for orders placed or performed in visit on 08/23/17 (from the past 24 hour(s))  POCT rapid strep A     Status: None   Collection Time: 08/23/17  8:47 AM  Result Value Ref Range   Rapid Strep A Screen Negative Negative   Assessment and Plan :   Sore throat - Plan: POCT rapid strep A, Culture, Group A Strep  Epigastric pain - Plan: EKG 12-Lead  Essential  hypertension - Plan: EKG 12-Lead  Elevated blood pressure reading - Plan: EKG 12-Lead  Coronary artery disease involving native heart without angina pectoris, unspecified vessel or lesion type - Plan: EKG 12-Lead  Hypertensive heart disease without heart failure  Tobacco abuse - Plan: DG Chest 2 View  Hypoxia - Plan: DG Chest 2 View, albuterol (PROVENTIL) (2.5 MG/3ML) 0.083% nebulizer solution 2.5 mg, ipratropium (ATROVENT) nebulizer solution 0.5 mg  Dry cough - Plan: DG Chest 2 View, methylPREDNISolone acetate (DEPO-MEDROL) injection 80 mg  Sinus pain - Plan: methylPREDNISolone acetate (DEPO-MEDROL) injection 80 mg  Sinus congestion - Plan: methylPREDNISolone acetate (DEPO-MEDROL) injection 80 mg  Discussed case with Dr. Carlota Raspberry.  Patient is a very high risk for heart event but it appears that she is stable given her comparable EKGs.  Patient will plan to follow-up with her cardiologist, Dr. Alvester Chou, ASAP.  Otherwise, IM Depo-Medrol used in clinic as a strong anti-inflammatory for her upper respiratory symptoms as well as her hypoxia which had slight improvement with a breathing treatment.  We will have patient schedule albuterol inhaler as well.  Suspect she may be having the start of reactive airway disease, COPD given her long-standing history of smoking.  For supportive care will use Tessalon.  ER precautions reviewed, patient will follow-up tomorrow.  Jaynee Eagles, PA-C [facility] at [facility] [phone] 08/23/2017  8:57 AM

## 2017-08-24 ENCOUNTER — Encounter (HOSPITAL_COMMUNITY): Payer: Self-pay | Admitting: Emergency Medicine

## 2017-08-24 ENCOUNTER — Other Ambulatory Visit: Payer: Self-pay

## 2017-08-24 ENCOUNTER — Encounter: Payer: Self-pay | Admitting: Urgent Care

## 2017-08-24 ENCOUNTER — Ambulatory Visit (INDEPENDENT_AMBULATORY_CARE_PROVIDER_SITE_OTHER): Payer: Managed Care, Other (non HMO) | Admitting: Urgent Care

## 2017-08-24 ENCOUNTER — Emergency Department (HOSPITAL_COMMUNITY): Payer: Managed Care, Other (non HMO)

## 2017-08-24 ENCOUNTER — Emergency Department (HOSPITAL_COMMUNITY)
Admission: EM | Admit: 2017-08-24 | Discharge: 2017-08-24 | Disposition: A | Discharge status: Home / Self Care | Payer: Managed Care, Other (non HMO) | Attending: Emergency Medicine | Admitting: Emergency Medicine

## 2017-08-24 VITALS — BP 133/72 | HR 62 | Resp 18 | Ht 64.0 in | Wt 225.2 lb

## 2017-08-24 DIAGNOSIS — N182 Chronic kidney disease, stage 2 (mild): Secondary | ICD-10-CM | POA: Diagnosis not present

## 2017-08-24 DIAGNOSIS — I2510 Atherosclerotic heart disease of native coronary artery without angina pectoris: Secondary | ICD-10-CM | POA: Insufficient documentation

## 2017-08-24 DIAGNOSIS — Z720 Tobacco use: Secondary | ICD-10-CM

## 2017-08-24 DIAGNOSIS — I509 Heart failure, unspecified: Secondary | ICD-10-CM | POA: Diagnosis not present

## 2017-08-24 DIAGNOSIS — I119 Hypertensive heart disease without heart failure: Secondary | ICD-10-CM

## 2017-08-24 DIAGNOSIS — I10 Essential (primary) hypertension: Secondary | ICD-10-CM

## 2017-08-24 DIAGNOSIS — Z7982 Long term (current) use of aspirin: Secondary | ICD-10-CM | POA: Insufficient documentation

## 2017-08-24 DIAGNOSIS — R05 Cough: Secondary | ICD-10-CM | POA: Diagnosis present

## 2017-08-24 DIAGNOSIS — E1122 Type 2 diabetes mellitus with diabetic chronic kidney disease: Secondary | ICD-10-CM | POA: Diagnosis not present

## 2017-08-24 DIAGNOSIS — Z79899 Other long term (current) drug therapy: Secondary | ICD-10-CM | POA: Diagnosis not present

## 2017-08-24 DIAGNOSIS — J069 Acute upper respiratory infection, unspecified: Secondary | ICD-10-CM | POA: Insufficient documentation

## 2017-08-24 DIAGNOSIS — F17210 Nicotine dependence, cigarettes, uncomplicated: Secondary | ICD-10-CM | POA: Diagnosis not present

## 2017-08-24 DIAGNOSIS — R0902 Hypoxemia: Secondary | ICD-10-CM

## 2017-08-24 DIAGNOSIS — B9789 Other viral agents as the cause of diseases classified elsewhere: Secondary | ICD-10-CM | POA: Insufficient documentation

## 2017-08-24 DIAGNOSIS — I130 Hypertensive heart and chronic kidney disease with heart failure and stage 1 through stage 4 chronic kidney disease, or unspecified chronic kidney disease: Secondary | ICD-10-CM | POA: Insufficient documentation

## 2017-08-24 LAB — CBG MONITORING, ED: GLUCOSE-CAPILLARY: 122 mg/dL — AB (ref 70–99)

## 2017-08-24 MED ORDER — PREDNISONE 20 MG PO TABS: 40.0000 mg | ORAL_TABLET | Freq: Every day | ORAL | 0 refills | Status: DC

## 2017-08-24 MED ORDER — IPRATROPIUM-ALBUTEROL 0.5-2.5 (3) MG/3ML IN SOLN
3.0000 mL | Freq: Once | RESPIRATORY_TRACT | Status: AC
  Administered 2017-08-24: 3 mL via RESPIRATORY_TRACT
  Filled 2017-08-24: qty 3

## 2017-08-24 MED ORDER — PREDNISONE 20 MG PO TABS
40.0000 mg | ORAL_TABLET | Freq: Once | ORAL | Status: AC
  Administered 2017-08-24: 40 mg via ORAL
  Filled 2017-08-24: qty 2

## 2017-08-24 NOTE — ED Notes (Signed)
ED Provider at bedside. 

## 2017-08-24 NOTE — Progress Notes (Signed)
MRN: [id number] DOB: 1960/12/17  Subjective:   [name] is a 57 y.o. female presenting for follow-up on hypoxia.  Refer to yesterday's note for more detail.  Patient denies history of COPD, asthma.  She has long-standing history of smoking, has not smoked in the past 3 to 4 days.  Denies fever, shortness of breath, chest pain, wheezing.  Reports that her sinus congestion, sinus headache, throat pain and cough are somewhat improved today.  She has been using her albuterol inhaler as instructed since yesterday.  [name] has a current medication list which includes the following prescription(s): albuterol, amlodipine, aspirin, azelastine, benzonatate, chlorthalidone, hydralazine, ibuprofen, irbesartan, isosorbide mononitrate, metoprolol succinate, and omega-3 acid ethyl esters. Also is allergic to bupropion; doxazosin mesylate; rosuvastatin; simvastatin; wellbutrin [bupropion hcl]; and zetia [ezetimibe].  [name]  has a past medical history of Angina, CAD (coronary artery disease), non obstructive CAD in 2010, Diabetes mellitus without complication ([place]), Family history of premature CAD, adenomatous colonic polyps (06/11/2016), Hyperlipidemia, Hypertension, Hypertensive heart disease, Palpitations, Reflux, and Tobacco abuse. Also  has a past surgical history that includes Wrist fracture surgery (Right, ~ 1996); RENAL DOPPLER (06/05/2009); Cardiac catheterization (02/07/2008); Cardiac catheterization (04/16/2011); Cardiovascular stress test (03/25/2011); transthoracic echocardiogram (01/25/2008); left heart catheterization with coronary angiogram (N/A, 04/16/2011); left heart catheterization with coronary angiogram (N/A, 09/01/2012); Fracture surgery (Left, 1972); and Appendectomy (1974).  Objective:   Vitals: BP 133/72   Pulse 62   Resp 18   Ht 5\' 4"  (1.626 m)   Wt 225 lb 3.2 oz (102.2 kg)   LMP 12/14/2013   SpO2 90%   BMI 38.66 kg/m   Physical Exam  Constitutional: She is oriented to person,  place, and time. She appears well-developed and well-nourished.  HENT:  Mouth/Throat: Oropharynx is clear and moist.  Eyes: Right eye exhibits no discharge. Left eye exhibits no discharge. No scleral icterus.  Cardiovascular: Normal rate, regular rhythm, normal heart sounds and intact distal pulses. Exam reveals no gallop and no friction rub.  No murmur heard. Pulmonary/Chest: Effort normal. No stridor. No respiratory distress. She has wheezes (mild over mid lung fields bilaterally). She has no rales.  Neurological: She is alert and oriented to person, place, and time.  Skin: Skin is warm and dry.  Psychiatric: She has a normal mood and affect.    Results for orders placed or performed in visit on 08/23/17 (from the past 24 hour(s))  CBC     Status: None   Collection Time: 08/23/17 11:58 AM  Result Value Ref Range   WBC 8.6 3.4 - 10.8 x10E3/uL   RBC 5.00 3.77 - 5.28 x10E6/uL   Hemoglobin 13.9 11.1 - 15.9 g/dL   Hematocrit 43.1 34.0 - 46.6 %   MCV 86 79 - 97 fL   MCH 27.8 26.6 - 33.0 pg   MCHC 32.3 31.5 - 35.7 g/dL   RDW 14.5 12.3 - 15.4 %   Platelets 264 150 - 450 x10E3/uL   Narrative   Performed at:  [address], [place], [place]  [id number] Lab Director: Rush Farmer MD, Phone:  [phone]  Comprehensive metabolic panel     Status: Abnormal   Collection Time: 08/23/17 11:58 AM  Result Value Ref Range   Glucose 154 (H) 65 - 99 mg/dL   BUN 21 6 - 24 mg/dL   Creatinine, Ser 1.73 (H) 0.57 - 1.00 mg/dL   GFR calc non Af Amer 33 (L) >59 mL/min/1.73   GFR calc Af Amer 38 (  L) >59 mL/min/1.73   BUN/Creatinine Ratio 12 9 - 23   Sodium 140 134 - 144 mmol/L   Potassium 3.8 3.5 - 5.2 mmol/L   Chloride 101 96 - 106 mmol/L   CO2 23 20 - 29 mmol/L   Calcium 9.2 8.7 - 10.2 mg/dL   Total Protein 6.9 6.0 - 8.5 g/dL   Albumin 4.0 3.5 - 5.5 g/dL   Globulin, Total 2.9 1.5 - 4.5 g/dL   Albumin/Globulin Ratio 1.4 1.2 - 2.2   Bilirubin Total 0.3 0.0 - 1.2 mg/dL    Alkaline Phosphatase 45 39 - 117 IU/L   AST 29 0 - 40 IU/L   ALT 37 (H) 0 - 32 IU/L   Narrative   Performed at:  [facility] [address] [place], [place]  [id number] Lab Director: Rush Farmer MD, Phone:  [phone]  Hemoglobin A1c     Status: Abnormal   Collection Time: 08/23/17 11:58 AM  Result Value Ref Range   Hgb A1c MFr Bld 6.3 (H) 4.8 - 5.6 %   Est. average glucose Bld gHb Est-mCnc 134 mg/dL   Narrative   Performed at:  [address], [place], [place]  [id number] Lab Director: Rush Farmer MD, Phone:  [phone]    Assessment and Plan :   Hypoxia  Tobacco abuse  Hypertensive heart disease without heart failure  Coronary artery disease involving native heart without angina pectoris, unspecified vessel or lesion type  Essential hypertension  Discussed case with Dr. Mitchel Honour.  We are in agreement to send patient to the hospital for emergent evaluation and management of respiratory failure, hypoxia in setting of CAD, HTN, tobacco abuse.  Patient refused transport by ambulance but was agreeable to go by personal vehicle.  Potential for life-threatening event from hypoxia and respiratory failure reviewed with patient.  She verbalized understanding and contracted for safety.  Madison Eagles, PA-C [facility] [phone] 08/24/2017  9:06 AM

## 2017-08-24 NOTE — ED Triage Notes (Signed)
Patient here by request of Physician due to what she states "low oxygen levels." She has a  2 day history of sore throat, dry cough, sinus pain, sinus congestion. She reports symptoms are improving because she is taking the medications presribed. Her oxy here is 95% on RA. Expiratory wheezes noted. NAD. Denies SOB, Chx Pain or fevers.

## 2017-08-24 NOTE — ED Notes (Signed)
Patient transported to X-ray 

## 2017-08-24 NOTE — ED Provider Notes (Signed)
[facility] EMERGENCY DEPARTMENT Provider Note   CSN: [id number] Arrival date & time: 08/24/17  6213   History   Chief Complaint Chief Complaint  Patient presents with  . Sent from Dr. Gabriel Carina  . Nasal Congestion    HPI [name] is a 57 y.o. female.   HPI   72 YOF presents today with complaints of upper respiratory congestion. Patient notes over the last 3 days she's had rhinorrhea and nasal congestion and cough. Patient denies any shortness of breath or fever, denies any chest pain. She notes that she was seen at her primary care provider's office tomorrow given an injection of steroids him a cough medication, albuterol and sent home. She was reevaluated today, they were concerned that her oxygen was low and sent her for evaluation here.patient denies any close sick contacts.   Past Medical History:  Diagnosis Date  . Angina   . CAD (coronary artery disease), non obstructive CAD in 2010    a. 2013 Cath: LAD 60, RCA 75m, 70d; b. 2014 Cath: stable anatomy.  . Diabetes mellitus without complication ([place])    a. 12/2014 HbA1c 6.5; b. Rx Glipizide - not taking.  . Family history of premature CAD   . Hx of adenomatous colonic polyps 06/11/2016  . Hyperlipidemia   . Hypertension   . Hypertensive heart disease   . Palpitations   . Reflux   . Tobacco abuse     Patient Active Problem List   Diagnosis Date Noted  . Hx of adenomatous colonic polyps 06/11/2016  . Diverticulosis 06/07/2016  . Sleep apnea 03/18/2016  . 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 Hypertensive heart disease   . Hematoma of groin - right, s/p cath; stable on discharge 09/02/2012  . Pleuritic chest pain 08/31/2012  . Obesity (BMI 30-39.9) 08/31/2012  . Renal cyst- on CT in 2002 08/31/2012  . CAD (coronary artery disease) 04/15/2011  . Abnormal stress test, Lexiscan myoview 03/25/2011-sm. anterior ischemia 04/15/2011  . Essential hypertension  04/15/2011  . Dyslipidemia- low HDL 04/15/2011  . Metabolic syndrome 08/65/7846  . Tobacco abuse 04/15/2011  . Family history of premature CAD 04/15/2011    Past Surgical History:  Procedure Laterality Date  . APPENDECTOMY  1974  . CARDIAC CATHETERIZATION  02/07/2008   Recommendation-F/U stress test  . CARDIAC CATHETERIZATION  04/16/2011   Recommendation-Increase medical therapy trial  . CARDIOVASCULAR STRESS TEST  03/25/2011   Suspect subtle anterior septal ischemia  . FRACTURE SURGERY Left 1972   forearm fracture  . LEFT HEART CATHETERIZATION WITH CORONARY ANGIOGRAM N/A 04/16/2011   Procedure: LEFT HEART CATHETERIZATION WITH CORONARY ANGIOGRAM;  Surgeon: Troy Sine, MD;  Location[facility] LAB;  Service: Cardiovascular;  Laterality: N/A;  . LEFT HEART CATHETERIZATION WITH CORONARY ANGIOGRAM N/A 09/01/2012   Procedure: LEFT HEART CATHETERIZATION WITH CORONARY ANGIOGRAM;  Surgeon: Pixie Casino, MD;  Location: [facility] CATH LAB;  Service: Cardiovascular;  Laterality: N/A;  . RENAL DOPPLER  06/05/2009   No evidence of significant diameter reduction  . TRANSTHORACIC ECHOCARDIOGRAM  01/25/2008   EF 60-65%, Moderate concentric LVH  . WRIST FRACTURE SURGERY Right ~ 1996     OB History   None      Home Medications    Prior to Admission medications   Medication Sig Start Date End Date Taking? Authorizing Provider  albuterol (PROVENTIL HFA;VENTOLIN HFA) 108 (90 Base) MCG/ACT inhaler Inhale 2 puffs into the lungs every 4 (four) hours as  needed. 08/23/17   Jaynee Eagles, PA-C  amLODipine (NORVASC) 10 MG tablet Take 1 tablet (10 mg total) by mouth daily. 03/18/16   Erlene Quan, PA-C  aspirin 81 MG tablet Take 81 mg by mouth at bedtime.    [provider]  azelastine (ASTELIN) 0.1 % nasal spray PLACE 1 SPRAY INTO BOTH NOSTRILS 2 (TWO) TIMES DAILY. USE IN [facility] NOSTRIL AS DIRECTED 03/21/17   Tenna Delaine D, PA-C  benzonatate (TESSALON) 100 MG capsule Take 1-2 capsules (100-200 mg  total) by mouth 3 (three) times daily as needed. 08/23/17   Jaynee Eagles, PA-C  chlorthalidone (HYGROTON) 25 MG tablet Take 1 tablet (25 mg total) by mouth daily. 11/08/16   Lorretta Harp, MD  hydrALAZINE (APRESOLINE) 100 MG tablet Take 1 tablet (100 mg total) by mouth 3 (three) times daily. Please call to make appointment for further refills. 08/01/17   Lorretta Harp, MD  ibuprofen (ADVIL,MOTRIN) 800 MG tablet Take 1 tablet (800 mg total) by mouth every 8 (eight) hours as needed. 10/16/16   Weber, Damaris Hippo, PA-C  irbesartan (AVAPRO) 300 MG tablet Take 1 tablet (300 mg total) by mouth daily. 11/02/16   Lorretta Harp, MD  isosorbide mononitrate (IMDUR) 30 MG 24 hr tablet TAKE 1 TABLET BY MOUTH EVERY DAY 04/04/17   Lorretta Harp, MD  metoprolol succinate (TOPROL-XL) 100 MG 24 hr tablet Take 1 tablet (100 mg total) by mouth daily. Take with or immediately following a meal.  Ov needed for refills 12/13/16   Weber, Damaris Hippo, PA-C  omega-3 acid ethyl esters (LOVAZA) 1 g capsule Take 2 capsules (2 g total) by mouth 2 (two) times daily. 07/26/17   Lorretta Harp, MD  predniSONE (DELTASONE) 20 MG tablet Take 2 tablets (40 mg total) by mouth daily. 08/24/17   Okey Regal, PA-C    Family History Family History  Problem Relation Age of Onset  . Stroke Father   . Hypertension Father   . Coronary artery disease Brother   . Stroke Maternal Grandmother   . Cancer Maternal Grandmother        Breast cancer  . Hypertension Maternal Grandfather   . Heart attack Maternal Grandfather   . Heart attack Maternal Aunt 41  . Colon cancer Neg Hx   . Stomach cancer Neg Hx     Social History Social History   Tobacco Use  . Smoking status: Current Every Day Smoker    Packs/day: 0.50    Years: 30.00    Pack years: 15.00    Types: Cigarettes  . Smokeless tobacco: Never Used  . Tobacco comment: 4-5 cigarettes per day for the past two weeks  Substance Use Topics  . Alcohol use: No    Alcohol/week:  0.0 standard drinks  . Drug use: No     Allergies   Bupropion; Doxazosin mesylate; Rosuvastatin; Simvastatin; Wellbutrin [bupropion hcl]; and Zetia [ezetimibe]   Review of Systems Review of Systems  All other systems reviewed and are negative.    Physical Exam Updated Vital Signs BP (!) 173/81 (BP Location: Right Arm)   Pulse 61   Temp 98.6 F (37 C) (Oral)   Resp 16   Ht 5\' 4"  (1.626 m)   Wt 102.1 kg   LMP 12/14/2013   SpO2 99%   BMI 38.62 kg/m   Physical Exam  Constitutional: She is oriented to person, place, and time. She appears well-developed and well-nourished.  HENT:  Head: Normocephalic and atraumatic.  Rhinorrhea-  final TMs normal, oropharynx clear without swelling or edema  Eyes: Pupils are equal, round, and reactive to light. Conjunctivae are normal. Right eye exhibits no discharge. Left eye exhibits no discharge. No scleral icterus.  Neck: Normal range of motion. No JVD present. No tracheal deviation present.  Cardiovascular: Normal rate, regular rhythm, normal heart sounds and intact distal pulses.  No murmur heard. Pulmonary/Chest: Effort normal. No stridor. No respiratory distress. She has wheezes. She has no rales. She exhibits no tenderness.  No signs of respiratory distress, minor bilateral expiratory wheeze noted  Musculoskeletal: She exhibits no edema.  Neurological: She is alert and oriented to person, place, and time. Coordination normal.  Psychiatric: She has a normal mood and affect. Her behavior is normal. Judgment and thought content normal.  Nursing note and vitals reviewed.    ED Treatments / Results  Labs (all labs ordered are listed, but only abnormal results are displayed) Labs Reviewed  CBG MONITORING, ED - Abnormal; Notable for the following components:      Result Value   Glucose-Capillary 122 (*)    All other components within normal limits    EKG None  Radiology Dg Chest 2 View  Result Date: 08/24/2017 CLINICAL DATA:   Shortness of breath EXAM: CHEST - 2 VIEW COMPARISON:  Yesterday FINDINGS: Borderline heart size that is stable. Negative mediastinal contours. Calcified granulomas in the right upper lobe. There is no edema, consolidation, effusion, or pneumothorax. IMPRESSION: No evidence of active disease. Electronically Signed   By: Monte Fantasia M.D.   On: 08/24/2017 12:59   Dg Chest 2 View  Result Date: 08/23/2017 CLINICAL DATA:  Cough.  Hypoxia.  Current smoker. EXAM: CHEST - 2 VIEW COMPARISON:  CTA chest 12/18/2014. Chest x-rays 12/18/2014, 08/30/2012 and earlier. FINDINGS: Cardiac silhouette upper normal in size. Thoracic aorta mildly atherosclerotic. Hilar and mediastinal contours otherwise unremarkable. Two nodules projected over or within the RIGHT UPPER LOBE which are very easily visible despite their small size, shown on prior examinations to represent calcified granulomas. Lungs otherwise clear. Bronchovascular markings normal. Pulmonary vascularity normal. No confluent airspace consolidation. No pleural effusions. Mild degenerative changes involving the thoracic and UPPER lumbar spine. IMPRESSION: 1. Borderline heart size.  No acute cardiopulmonary disease. 2. Old granulomatous disease. Electronically Signed   By: Evangeline Dakin M.D.   On: 08/23/2017 09:33    Procedures Procedures (including critical care time)  Medications Ordered in ED Medications  ipratropium-albuterol (DUONEB) 0.5-2.5 (3) MG/3ML nebulizer solution 3 mL (3 mLs Nebulization Given 08/24/17 1312)  predniSONE (DELTASONE) tablet 40 mg (40 mg Oral Given 08/24/17 1335)  ipratropium-albuterol (DUONEB) 0.5-2.5 (3) MG/3ML nebulizer solution 3 mL (3 mLs Nebulization Given 08/24/17 1427)     Initial Impression / Assessment and Plan / ED Course  I have reviewed the triage vital signs and the nursing notes.  Pertinent labs & imaging results that were available during my care of the patient were reviewed by me and considered in my medical  decision making (see chart for details).     Labs:   Imaging: DG chest 2 view  Consults:  Therapeutics: Duoneb   Discharge Meds: prednisone  Assessment/Plan: 57 year old female presents today with upper respiratory infection. She is well appearing in no acute distress. Her oxygen is in the mid to upper 90s at rest, she denies any complaints of shortness of breath or chest pain. Patient was sent for further evaluation from primary care given her cardiac history. She has no signs of cardiac etiology at this  time, she had a reassuring workup yesterday, yesterday's labs showed no elevation in white count negative strep and EKG comparable to previous. Patient will have repeat chest x-ray here today, breathing treatment, I will ambulate her on pulse ox, but I anticipate outpatient follow-up given her overall well appearance and lack of subjective complaints.  Patient had breathing treatment here with significant improvement in her wheezes. I personally ambulated her, she persisted to have oxygen saturations in the mid lower 90s with ambulation with no respiratory distress. I do feel patient is stable for outpatient management. She is a diabetic with only minor elevated CBG, she will benefit from outpatient steroids, she'll follow up closely with her primary care for repeat evaluation and management. She is given strict return precautions, she verbalized understanding and agreement to today's plan had no further questions concerns.    Final Clinical Impressions(s) / ED Diagnoses   Final diagnoses:  Viral URI with cough    ED Discharge Orders         Ordered    predniSONE (DELTASONE) 20 MG tablet  Daily,   Status:  Discontinued     08/24/17 1536    predniSONE (DELTASONE) 20 MG tablet  Daily     08/24/17 1536           Okey Regal, PA-C 08/24/17 1658    Long, Wonda Olds, MD 08/25/17 [phone]

## 2017-08-24 NOTE — Discharge Instructions (Addendum)
Please read attached information. If you experience any new or worsening signs or symptoms please return to the emergency room for evaluation. Please follow-up with your primary care provider or specialist as discussed. Please use medication prescribed only as directed and discontinue taking if you have any concerning signs or symptoms.   °

## 2017-08-24 NOTE — ED Notes (Signed)
Ambulated pt in the hallway O2 sats were maintained at 92 lowest sat at 91.

## 2017-08-24 NOTE — Patient Instructions (Addendum)
Please report to the emergency room immediately for management of respiratory failure. Dr. Mitchel Honour and myself are concerned that your breathing can be life threatening given how low your oxygen level is. Please do not go anywhere else except the ER.    I will contact you with your lab results within the next 2 weeks.  If you have not heard from [place] then please contact us. The fastest way to get your results is to register for My Chart.     Hypoxia Hypoxia is a condition that happens when there is a lack of oxygen in the body's tissues and organs. When there is not enough oxygen, organs cannot work as they should. This causes serious problems throughout the body and in the brain. What are the causes? This condition may be caused by:  Exposure to high altitude.  A collapsed lung (pneumothorax).  Lung infection (pneumonia).  Lung injury.  Long-term (chronic) lung disease, such as COPD (chronic obstructive pulmonary disease).  Blood collecting in the chest cavity (hemothorax).  Food, saliva, or vomit getting into the airway (aspiration).  Reduced blood flow (ischemia).  Severe blood loss.  Slow or shallow breathing (hypoventilation).  Blood disorders, such as anemia.  Carbon monoxide poisoning.  The heart suddenly stopping (cardiac arrest).  Anesthetic medicines.  Drowning.  Choking.  What are the signs or symptoms? Symptoms of this condition include:  Headache.  Fatigue.  Drowsiness.  Forgetfulness.  Nausea.  Confusion.  Shortness of breath.  Dizziness.  Bluish color of the skin, lips, or nail beds (cyanosis).  Change in consciousness or awareness.  If hypoxia is not treated, it can lead to convulsions, loss of consciousness (coma), or brain damage. How is this diagnosed? This condition may be diagnosed based on:  A physical exam.  Blood tests.  A test that measures how much oxygen is in your blood (pulse oximetry). This is done with a sensor  that is placed on your finger, toe, or earlobe.  Chest X-ray.  Tests to check your lung function (pulmonary function tests).  A test to check the electrical activity of your heart (electrocardiogram, ECG).  You may have other tests to determine the cause of your hypoxia. How is this treated? Treatment for this condition depends on what is causing the hypoxia. You will likely be treated with oxygen therapy. This may be done by giving you oxygen through a face mask or through tubes in your nose. Your health care provider may also recommend other therapies to treat the underlying cause of your hypoxia. Follow these instructions at home:  Take over-the-counter and prescription medicines only as told by your health care provider.  Do not use any products that contain nicotine or tobacco, such as cigarettes and e-cigarettes. If you need help quitting, ask your health care provider.  Avoid secondhand smoke.  Work with your health care provider to manage any chronic conditions you have that may be causing hypoxia, such as COPD.  Keep all follow-up visits as told by your health care provider. This is important. Contact a health care provider if:  You have a fever.  You have trouble breathing, even after treatment.  You become extremely short of breath when you exercise. Get help right away if:  Your shortness of breath gets worse, especially with normal or very little activity.  Your skin, lips, or nail beds have a bluish color.  You become confused or you cannot think properly.  You have chest pain. Summary  Hypoxia is a condition  that happens when there is a lack of oxygen in the body's tissues and organs.  If hypoxia is not treated, it can lead to convulsions, loss of consciousness (coma), or brain damage.  Symptoms of hypoxia can include a headache, shortness of breath, confusion, nausea, and a bluish skin color.  Hypoxia has many possible causes, including exposure to high  altitude, carbon monoxide poisoning, or other health issues, such as blood disorders or cardiac arrest.  Hypoxia is usually treated with oxygen therapy. This information is not intended to replace advice given to you by your health care provider. Make sure you discuss any questions you have with your health care provider. Document Released: 02/16/2016 Document Revised: 02/16/2016 Document Reviewed: 02/16/2016 Elsevier Interactive Patient Education  2018 [employer].     IF you received an x-ray today, you will receive an invoice from [facility]. Please contact [facility] at [phone] with questions or concerns regarding your invoice.   IF you received labwork today, you will receive an invoice from [facility]. Please contact [facility] at [phone] with questions or concerns regarding your invoice.   Our billing staff will not be able to assist you with questions regarding bills from these companies.  You will be contacted with the lab results as soon as they are available. The fastest way to get your results is to activate your My Chart account. Instructions are located on the last page of this paperwork. If you have not heard from [place] regarding the results in 2 weeks, please contact this office.

## 2017-08-25 LAB — CULTURE, GROUP A STREP: STREP A CULTURE: NEGATIVE

## 2017-08-30 ENCOUNTER — Encounter: Payer: Managed Care, Other (non HMO) | Admitting: Physician Assistant

## 2017-08-31 ENCOUNTER — Ambulatory Visit (INDEPENDENT_AMBULATORY_CARE_PROVIDER_SITE_OTHER): Payer: Managed Care, Other (non HMO) | Admitting: Physician Assistant

## 2017-08-31 ENCOUNTER — Encounter: Payer: Self-pay | Admitting: Physician Assistant

## 2017-08-31 ENCOUNTER — Other Ambulatory Visit: Payer: Self-pay

## 2017-08-31 VITALS — BP 158/78 | HR 63 | Temp 97.6°F | Resp 20 | Ht 64.57 in | Wt 220.0 lb

## 2017-08-31 DIAGNOSIS — R062 Wheezing: Secondary | ICD-10-CM | POA: Diagnosis not present

## 2017-08-31 DIAGNOSIS — R7989 Other specified abnormal findings of blood chemistry: Secondary | ICD-10-CM

## 2017-08-31 DIAGNOSIS — J209 Acute bronchitis, unspecified: Secondary | ICD-10-CM

## 2017-08-31 DIAGNOSIS — Z720 Tobacco use: Secondary | ICD-10-CM | POA: Diagnosis not present

## 2017-08-31 DIAGNOSIS — I10 Essential (primary) hypertension: Secondary | ICD-10-CM | POA: Diagnosis not present

## 2017-08-31 LAB — POCT CBC
GRANULOCYTE PERCENT: 69.9 % (ref 37–80)
HEMATOCRIT: 43.1 % (ref 37.7–47.9)
HEMOGLOBIN: 14.1 g/dL (ref 12.2–16.2)
Lymph, poc: 4 — AB (ref 0.6–3.4)
MCH: 28.1 pg (ref 27–31.2)
MCHC: 32.6 g/dL (ref 31.8–35.4)
MCV: 86.2 fL (ref 80–97)
MID (cbc): 0.4 (ref 0–0.9)
MPV: 9.1 fL (ref 0–99.8)
POC GRANULOCYTE: 10.3 — AB (ref 2–6.9)
POC LYMPH PERCENT: 27.1 %L (ref 10–50)
POC MID %: 3 %M (ref 0–12)
Platelet Count, POC: 326 10*3/uL (ref 142–424)
RBC: 5 M/uL (ref 4.04–5.48)
RDW, POC: 14.9 %
WBC: 14.7 10*3/uL — AB (ref 4.6–10.2)

## 2017-08-31 LAB — CMP14+EGFR
ALT: 43 IU/L — AB (ref 0–32)
AST: 40 IU/L (ref 0–40)
Albumin/Globulin Ratio: 1.3 (ref 1.2–2.2)
Albumin: 3.6 g/dL (ref 3.5–5.5)
Alkaline Phosphatase: 41 IU/L (ref 39–117)
BILIRUBIN TOTAL: 0.2 mg/dL (ref 0.0–1.2)
BUN / CREAT RATIO: 23 (ref 9–23)
BUN: 45 mg/dL — AB (ref 6–24)
CHLORIDE: 101 mmol/L (ref 96–106)
CO2: 23 mmol/L (ref 20–29)
Calcium: 8.7 mg/dL (ref 8.7–10.2)
Creatinine, Ser: 2 mg/dL — ABNORMAL HIGH (ref 0.57–1.00)
GFR calc non Af Amer: 28 mL/min/{1.73_m2} — ABNORMAL LOW (ref >59)
GFR, EST AFRICAN AMERICAN: 32 mL/min/{1.73_m2} — AB (ref >59)
GLUCOSE: 154 mg/dL — AB (ref 65–99)
Globulin, Total: 2.8 g/dL (ref 1.5–4.5)
POTASSIUM: 3.6 mmol/L (ref 3.5–5.2)
Sodium: 141 mmol/L (ref 134–144)
Total Protein: 6.4 g/dL (ref 6.0–8.5)

## 2017-08-31 MED ORDER — IPRATROPIUM BROMIDE HFA 17 MCG/ACT IN AERS: 2.0000 | INHALATION_SPRAY | Freq: Four times a day (QID) | RESPIRATORY_TRACT | 0 refills | Status: DC | PRN

## 2017-08-31 MED ORDER — ALBUTEROL SULFATE (2.5 MG/3ML) 0.083% IN NEBU
2.5000 mg | INHALATION_SOLUTION | Freq: Once | RESPIRATORY_TRACT | Status: AC
  Administered 2017-08-31: 2.5 mg via RESPIRATORY_TRACT

## 2017-08-31 MED ORDER — IPRATROPIUM BROMIDE 0.02 % IN SOLN
0.5000 mg | Freq: Once | RESPIRATORY_TRACT | Status: AC
  Administered 2017-08-31: 0.5 mg via RESPIRATORY_TRACT

## 2017-08-31 MED ORDER — DOXYCYCLINE HYCLATE 100 MG PO CAPS: 100.0000 mg | ORAL_CAPSULE | Freq: Two times a day (BID) | ORAL | 0 refills | Status: DC

## 2017-08-31 NOTE — Progress Notes (Signed)
MRN: [id number] DOB: 06-03-1960  Subjective:   [name] is a 57 y.o. female presenting for chief complaint of Nasal Congestion (X 1 week wheezing , chest congestion) .  Presents today for complete physical exam but has persisting URI she would like to discuss instead.  Has had sinus congestion, sinus pressure, sore throat, cough, wheezing, and runny nose.  Has been evaluated both in our office and at the ED for this condition.  She was hypoxic at her last visit and sent to the ED.  In the ED, they gave her breathing treatment and oral prednisone course.  Had CXR on 8/13 and 8/14, both negative for acute active disease. Please see those notes for additional details.   Since then, she notes she feels a little bit better but just "cannot get rid of this illness."  Has completed prednisone course.  Has only used albuterol inhaler a couple times.  Dry hacking cough and rhinorrhea still present, not worse, not better. Wheezing is worse at nighttime.  Sinus pressure improved. Sore throat resolved.  Denies shortness of breath, chest pain, lower leg swelling, fever, chills, nausea, vomiting, and abdominal pain. She has long-standing history of smoking but has no past medical history of COPD or asthma.  Has past no history of seasonal allergies.   [name] has a current medication list which includes the following prescription(s): albuterol, amlodipine, aspirin, benzonatate, chlorthalidone, hydralazine, ibuprofen, isosorbide mononitrate, metoprolol succinate, omega-3 acid ethyl esters, doxycycline, and ipratropium. Also is allergic to bupropion; doxazosin mesylate; rosuvastatin; simvastatin; wellbutrin [bupropion hcl]; and zetia [ezetimibe].  [name]  has a past medical history of Angina, CAD (coronary artery disease), non obstructive CAD in 2010, Diabetes mellitus without complication ([place]), Family history of premature CAD, adenomatous colonic polyps (06/11/2016), Hyperlipidemia, Hypertension,  Hypertensive heart disease, Palpitations, Reflux, and Tobacco abuse. Also  has a past surgical history that includes Wrist fracture surgery (Right, ~ 1996); RENAL DOPPLER (06/05/2009); Cardiac catheterization (02/07/2008); Cardiac catheterization (04/16/2011); Cardiovascular stress test (03/25/2011); transthoracic echocardiogram (01/25/2008); left heart catheterization with coronary angiogram (N/A, 04/16/2011); left heart catheterization with coronary angiogram (N/A, 09/01/2012); Fracture surgery (Left, 1972); and Appendectomy (1974).   Objective:   Vitals: BP (!) 158/78   Pulse 63 Comment: walking  Temp 97.6 F (36.4 C) (Oral)   Resp 20   Ht 5' 4.57" (1.64 m)   Wt 220 lb (99.8 kg)   LMP 12/14/2013   SpO2 97%   PF 260 L/min Comment: Post 1) 210   2) 220  3) 260  BMI 37.10 kg/m   Physical Exam  Constitutional: She is oriented to person, place, and time. She appears well-developed and well-nourished. No distress.  HENT:  Head: Normocephalic and atraumatic.  Right Ear: Tympanic membrane, external ear and ear canal normal.  Left Ear: Tympanic membrane, external ear and ear canal normal.  Nose: Mucosal edema and rhinorrhea present. Right sinus exhibits frontal sinus tenderness. Right sinus exhibits no maxillary sinus tenderness. Left sinus exhibits frontal sinus tenderness. Left sinus exhibits no maxillary sinus tenderness.  Mouth/Throat: Uvula is midline, oropharynx is clear and moist and mucous membranes are normal. No tonsillar exudate.  Eyes: Pupils are equal, round, and reactive to light. Conjunctivae and EOM are normal.  Neck: Normal range of motion.  Cardiovascular: Normal rate, regular rhythm, normal heart sounds and intact distal pulses.  Pulmonary/Chest: Effort normal. No accessory muscle usage or stridor. No tachypnea. No respiratory distress. She has wheezes (few wheezes noted in posterior bilateral lung fields ). She  has no rhonchi. She has no rales.  Musculoskeletal:       Right lower  leg: She exhibits no swelling.       Left lower leg: She exhibits no swelling.  Lymphadenopathy:       Head (right side): No submental, no submandibular, no tonsillar, no preauricular, no posterior auricular and no occipital adenopathy present.       Head (left side): No submental, no submandibular, no tonsillar, no preauricular, no posterior auricular and no occipital adenopathy present.    She has no cervical adenopathy.       Right: No supraclavicular adenopathy present.       Left: No supraclavicular adenopathy present.  Neurological: She is alert and oriented to person, place, and time.  Skin: Skin is warm and dry.  Psychiatric: She has a normal mood and affect.  Vitals reviewed.   Results for orders placed or performed in visit on 08/31/17 (from the past 24 hour(s))  POCT CBC     Status: Abnormal   Collection Time: 08/31/17  9:16 AM  Result Value Ref Range   WBC 14.7 (A) 4.6 - 10.2 K/uL   Lymph, poc 4.0 (A) 0.6 - 3.4   POC LYMPH PERCENT 27.1 10 - 50 %L   MID (cbc) 0.4 0 - 0.9   POC MID % 3.0 0 - 12 %M   POC Granulocyte 10.3 (A) 2 - 6.9   Granulocyte percent 69.9 37 - 80 %G   RBC 5.00 4.04 - 5.48 M/uL   Hemoglobin 14.1 12.2 - 16.2 g/dL   HCT, POC 43.1 37.7 - 47.9 %   MCV 86.2 80 - 97 fL   MCH, POC 28.1 27 - 31.2 pg   MCHC 32.6 31.8 - 35.4 g/dL   RDW, POC 14.9 %   Platelet Count, POC 326 142 - 424 K/uL   MPV 9.1 0 - 99.8 fL    Breathing treatment of duoneb given in office. Patient reports feeling better. Wheezing resolved. Peak flow increased from 230 (~59% of age predicted) pre breathing treatment to 38 (~66% of age predicted) post breathing treatment. SpO2 increased from 93% pre breathing treatment to 97% post breathing treatment.    Assessment and Plan :   1. Acute bronchitis with bronchospasm WBC elevated at 14.7 with left shift.  SPO2 increased to 97% post breathing tx. She is not dropping below SpO2 94% when ambulating.   She is overall well-appearing, no acute  distress.  No tachypnea or accessory muscle use noted.  Suspect she may be having the start of reactive airway disease, COPD, given her long-standing history of smoking. Peak flow did improve with breathing tx although still only 66% of age predicted, wheezing resolved.   Concern for underlying bacterial etiology at this time due luekocytosis, although she did just complete prednisone course a couple days ago.  Will cover with antibiotic at this time due to duration and smoking hx to cover both sinus and lung etiology. Plan for close follow up in 24 hours. Given strict ED precautions.   2. Wheezing Resolved.  Given Rx for Atrovent inhaler.  Use Atrovent and albuterol every 6 hours as needed for wheezing. - ipratropium (ATROVENT) nebulizer solution 0.5 mg - albuterol (PROVENTIL) (2.5 MG/3ML) 0.083% nebulizer solution 2.5 mg - POCT CBC - Care order/instruction - Check Pulse Oximetry while ambulating  3. Essential hypertension Elevated.  Recommended to contact Dr. Blanch Media office as soon as possible to schedule follow-up appointment.  4. Tobacco abuse Discussed smoking  cessation.  Ordered low-dose lung cancer screening CT.  5. Elevated serum creatinine Creatinine baseline appears to be around 1.3.  Per review of chart, most recent creatinine on 08/23/2017 was 1.73.  Patient no longer taking NSAIDs.  She is not taking an ACE or ARB for blood pressure control.  We will repeat today. - CMP14+EGFR  Orders Placed This Encounter  Procedures  . CT CHEST LUNG CA SCREEN LOW DOSE W/O CM    Standing Status:   Future    Standing Expiration Date:   11/01/2018    Order Specific Question:   Reason for Exam (SYMPTOM  OR DIAGNOSIS REQUIRED)    Answer:   age 57, 13 pack year history    Order Specific Question:   Is patient pregnant?    Answer:   No    Order Specific Question:   Radiology Contrast Protocol - do NOT remove file path    Answer:   \\[email or web address].pdf  . CMP14+EGFR  .  Care order/instruction    Peak flow before and after  . Check Pulse Oximetry while ambulating  . POCT CBC    Side effects, risks, benefits, and alternatives of the medications and treatment plan prescribed today were discussed, and patient expressed understanding of the instructions given. No barriers to understanding were identified. Red flags discussed in detail. Pt expressed understanding regarding what to do in case of emergency/urgent symptoms.   Tenna Delaine, PA-C  [facility] 08/31/2017 9:53 AM

## 2017-08-31 NOTE — Patient Instructions (Addendum)
Start antibiotic today.  Use albuterol and Atrovent inhaler every 4-6 hours as needed for wheezing.  Start Mucinex daily.  Do not use Mucinex DM.  Use nasal saline spray for nasal congestion.  Follow-up with me in 24 hours.  If any of your symptoms worsen or you develop new shortness of breath or persistent chest pain, please seek care immediately at the ED.  Avoid any over-the-counter anti-inflammatory such as ibuprofen, Aleve, or Advil.  Make sure you are drinking at least 64 ounces of water daily.  While taking Doxycycline:  -Do not drink milk or take iron supplements, multivitamins, calcium supplements, antacids, laxatives within 2 hours before or after taking doxycycline. -Avoid direct exposure to sunlight or tanning beds. Doxycycline can make you sunburn more easily. Wear protective clothing and use sunscreen (SPF 30 or higher) when you are outdoors. -Antibiotic medicines can cause diarrhea, which may be a sign of a new infection. If you have diarrhea that is watery or bloody, stop taking this medicine and seek medical care.     If you have lab work done today you will be contacted with your lab results within the next 2 weeks.  If you have not heard from [place] then please contact us. The fastest way to get your results is to register for My Chart.  Acute Bronchitis, Adult Acute bronchitis is when air tubes (bronchi) in the lungs suddenly get swollen. The condition can make it hard to breathe. It can also cause these symptoms:  A cough.  Coughing up clear, yellow, or green mucus.  Wheezing.  Chest congestion.  Shortness of breath.  A fever.  Body aches.  Chills.  A sore throat.  Follow these instructions at home: Medicines  Take over-the-counter and prescription medicines only as told by your doctor.  If you were prescribed an antibiotic medicine, take it as told by your doctor. Do not stop taking the antibiotic even if you start to feel better. General  instructions  Rest.  Drink enough fluids to keep your pee (urine) clear or pale yellow.  Avoid smoking and secondhand smoke. If you smoke and you need help quitting, ask your doctor. Quitting will help your lungs heal faster.  Use an inhaler, cool mist vaporizer, or humidifier as told by your doctor.  Keep all follow-up visits as told by your doctor. This is important. How is this prevented? To lower your risk of getting this condition again:  Wash your hands often with soap and water. If you cannot use soap and water, use hand sanitizer.  Avoid contact with people who have cold symptoms.  Try not to touch your hands to your mouth, nose, or eyes.  Make sure to get the flu shot every year.  Contact a doctor if:  Your symptoms do not get better in 2 weeks. Get help right away if:  You cough up blood.  You have chest pain.  You have very bad shortness of breath.  You become dehydrated.  You faint (pass out) or keep feeling like you are going to pass out.  You keep throwing up (vomiting).  You have a very bad headache.  Your fever or chills gets worse. This information is not intended to replace advice given to you by your health care provider. Make sure you discuss any questions you have with your health care provider. Document Released: 06/16/2007 Document Revised: 08/06/2015 Document Reviewed: 06/18/2015 Elsevier Interactive Patient Education  2018 [employer].   IF you received an x-ray today, you  will receive an invoice from [facility]. Please contact [facility]iology at [phone] with questions or concerns regarding your invoice.   IF you received labwork today, you will receive an invoice from [facility]. Please contact [facility] at [phone] with questions or concerns regarding your invoice.   Our billing staff will not be able to assist you with questions regarding bills from these companies.  You will be contacted with the lab results as  soon as they are available. The fastest way to get your results is to activate your My Chart account. Instructions are located on the last page of this paperwork. If you have not heard from [place] regarding the results in 2 weeks, please contact this office.

## 2017-09-01 ENCOUNTER — Ambulatory Visit: Payer: Managed Care, Other (non HMO)

## 2017-09-01 ENCOUNTER — Other Ambulatory Visit: Payer: Self-pay | Admitting: Physician Assistant

## 2017-09-01 ENCOUNTER — Other Ambulatory Visit: Payer: Self-pay

## 2017-09-01 ENCOUNTER — Telehealth: Payer: Self-pay | Admitting: Physician Assistant

## 2017-09-01 ENCOUNTER — Ambulatory Visit (INDEPENDENT_AMBULATORY_CARE_PROVIDER_SITE_OTHER): Payer: Managed Care, Other (non HMO) | Admitting: Physician Assistant

## 2017-09-01 ENCOUNTER — Encounter: Payer: Self-pay | Admitting: Physician Assistant

## 2017-09-01 VITALS — BP 180/82 | HR 96 | Ht 64.0 in | Wt 220.0 lb

## 2017-09-01 DIAGNOSIS — R7989 Other specified abnormal findings of blood chemistry: Secondary | ICD-10-CM

## 2017-09-01 DIAGNOSIS — J209 Acute bronchitis, unspecified: Secondary | ICD-10-CM

## 2017-09-01 DIAGNOSIS — I10 Essential (primary) hypertension: Secondary | ICD-10-CM

## 2017-09-01 LAB — BASIC METABOLIC PANEL
BUN/Creatinine Ratio: 19 (ref 9–23)
BUN: 38 mg/dL — ABNORMAL HIGH (ref 6–24)
CHLORIDE: 102 mmol/L (ref 96–106)
CO2: 25 mmol/L (ref 20–29)
Calcium: 8.6 mg/dL — ABNORMAL LOW (ref 8.7–10.2)
Creatinine, Ser: 2 mg/dL — ABNORMAL HIGH (ref 0.57–1.00)
GFR calc Af Amer: 32 mL/min/{1.73_m2} — ABNORMAL LOW (ref >59)
GFR calc non Af Amer: 28 mL/min/{1.73_m2} — ABNORMAL LOW (ref >59)
Glucose: 143 mg/dL — ABNORMAL HIGH (ref 65–99)
POTASSIUM: 3.5 mmol/L (ref 3.5–5.2)
Sodium: 136 mmol/L (ref 134–144)

## 2017-09-01 NOTE — Progress Notes (Signed)
[name]  MRN: [id number] DOB: 1960-06-04  Subjective:  [name] is a 57 y.o. female seen in office today for a chief complaint of follow-up on acute bronchitis.  Last seen in office yesterday.  Was given breathing treatment in office for wheezing and given Rx for antibiotics for persistent acute bronchitis with bronchospasm.  Please see that note for additional details.  Today, she reports she is 50% better.  She still has some sinus congestion and runny nose.  Cough has significantly improved.  Wheezing has resolved.  Denies shortness of breath, chest pain, lower leg swelling, fever, chills, nausea, vomiting, and abdominal pain.  She has taken all of her blood pressure medication today.  She has not schedule appointment with heart care yet.  She has been drinking at least 64 ounces of water over the past few days but notes that prior to this she did not drink any water for the past 3 years.  Does not use any over-the-counter NSAIDs.  No other questions or concerns today.   Review of Systems  Per HPI  Patient Active Problem List   Diagnosis Date Noted  . Hx of adenomatous colonic polyps 06/11/2016  . Diverticulosis 06/07/2016  . Sleep apnea 03/18/2016  . 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 Hypertensive heart disease   . Hematoma of groin - right, s/p cath; stable on discharge 09/02/2012  . Pleuritic chest pain 08/31/2012  . Obesity (BMI 30-39.9) 08/31/2012  . Renal cyst- on CT in 2002 08/31/2012  . CAD (coronary artery disease) 04/15/2011  . Abnormal stress test, Lexiscan myoview 03/25/2011-sm. anterior ischemia 04/15/2011  . Essential hypertension 04/15/2011  . Dyslipidemia- low HDL 04/15/2011  . Metabolic syndrome 40/98/1191  . Tobacco abuse 04/15/2011  . Family history of premature CAD 04/15/2011    Current Outpatient Medications on File Prior to Visit  Medication Sig Dispense Refill  . albuterol (PROVENTIL HFA;VENTOLIN HFA)  108 (90 Base) MCG/ACT inhaler Inhale 2 puffs into the lungs every 4 (four) hours as needed. 1 Inhaler 1  . amLODipine (NORVASC) 10 MG tablet Take 1 tablet (10 mg total) by mouth daily. 90 tablet 3  . aspirin 81 MG tablet Take 81 mg by mouth at bedtime.    . benzonatate (TESSALON) 100 MG capsule Take 1-2 capsules (100-200 mg total) by mouth 3 (three) times daily as needed. 60 capsule 0  . chlorthalidone (HYGROTON) 25 MG tablet Take 1 tablet (25 mg total) by mouth daily. 90 tablet 3  . doxycycline (VIBRAMYCIN) 100 MG capsule Take 1 capsule (100 mg total) by mouth 2 (two) times daily. 20 capsule 0  . hydrALAZINE (APRESOLINE) 100 MG tablet Take 1 tablet (100 mg total) by mouth 3 (three) times daily. Please call to make appointment for further refills. 270 tablet 0  . ibuprofen (ADVIL,MOTRIN) 800 MG tablet Take 1 tablet (800 mg total) by mouth every 8 (eight) hours as needed. 60 tablet 0  . ipratropium (ATROVENT HFA) 17 MCG/ACT inhaler Inhale 2 puffs into the lungs every 6 (six) hours as needed for wheezing. 1 Inhaler 0  . isosorbide mononitrate (IMDUR) 30 MG 24 hr tablet TAKE 1 TABLET BY MOUTH EVERY DAY 90 tablet 2  . metoprolol succinate (TOPROL-XL) 100 MG 24 hr tablet Take 1 tablet (100 mg total) by mouth daily. Take with or immediately following a meal.  Ov needed for refills 30 tablet 0  . omega-3 acid ethyl esters (LOVAZA) 1 g capsule Take 2  capsules (2 g total) by mouth 2 (two) times daily. 360 capsule 0   No current facility-administered medications on file prior to visit.     Allergies  Allergen Reactions  . Bupropion     Other reaction(s): Other Suicidal thoughts and increased depression  . Doxazosin Mesylate     Increased heart rate  . Rosuvastatin Other (See Comments)    myalgia  . Simvastatin     myalgias  . Wellbutrin [Bupropion Hcl]     unknown  . Zetia [Ezetimibe]     myalgias     Objective:  BP (!) 180/82 (BP Location: Left Arm, Patient Position: Sitting, Cuff Size:  Large)   Pulse 96   Ht 5\' 4"  (1.626 m)   Wt 220 lb (99.8 kg)   LMP 12/14/2013   SpO2 97%   PF 260 L/min Comment: 250 250 260  BMI 37.76 kg/m   Physical Exam  Constitutional: She is oriented to person, place, and time. She appears well-developed and well-nourished. No distress.  HENT:  Head: Normocephalic and atraumatic.  Nose: Mucosal edema and rhinorrhea present. Right sinus exhibits no maxillary sinus tenderness and no frontal sinus tenderness. Left sinus exhibits no maxillary sinus tenderness and no frontal sinus tenderness.  Eyes: Conjunctivae are normal.  Neck: Normal range of motion.  Cardiovascular: Normal rate, regular rhythm, normal heart sounds and intact distal pulses.  Pulmonary/Chest: Effort normal and breath sounds normal. No accessory muscle usage. No tachypnea. No respiratory distress. She has no decreased breath sounds. She has no wheezes. She has no rhonchi. She has no rales.  Neurological: She is alert and oriented to person, place, and time.  Skin: Skin is warm and dry.  Psychiatric: She has a normal mood and affect.  Vitals reviewed.   Assessment and Plan :  1. Acute bronchitis with bronchospasm Patient is doing much better today compared to yesterday.  SPO2 has stayed at 97% while ambulating.  Recommend continue with current medication regimen.  She would likely benefit from PFTs in the future for further evaluation of underlying COPD.  Given strict return precautions.  2. Elevated serum creatinine Stat BMP from this morning shows creatinine at same level as yesterday.  This was discussed with Dr. Brigitte Pulse.  We both agree that her AKI is likely due to dehydration secondary to acute bronchitis.  I have already placed urgent referral to nephrology.  Patient misunderstood our conversation this morning and held chlorthalidone only until she had her blood drawn and then took it today.  Encouraged her to continue holding chlorthalidone until repeat BMP in 2 days.  Continue  drinking at least 64 ounces of water daily.  Avoid over-the-counter NSAIDs.  Continue monitoring blood pressure at home.  Goal is less than 140/90.  Given strict ED precautions. - Basic metabolic panel; Future  3. Essential hypertension Elevated in office.  Notes it is normal for her to have blood pressure readings this high in the office.  She is asymptomatic.  Recommend contacting heart care and scheduling appointment as soon as possible for further management of hypertension.  Patient agrees to do so.   Note - This record has been created using [employer].  Chart creation errors have been sought, but may not always  have been located. Such creation errors do not reflect on  the standard of medical care.  Tenna Delaine PA-C  [facility] 09/01/2017 5:29 PM

## 2017-09-01 NOTE — Progress Notes (Signed)
Orders Placed This Encounter  Procedures  . Ambulatory referral to Nephrology    Referral Priority:   Urgent    Referral Type:   Consultation    Referral Reason:   Specialty Services Required    Requested Specialty:   Nephrology    Number of Visits Requested:   1

## 2017-09-01 NOTE — Patient Instructions (Addendum)
  Stop chlorthalidone.  Continue other blood pressure medications.  Do not use any over-the-counter anti-inflammatory such as ibuprofen, Aleve, or Advil.  Make sure to drink at least 64 ounces of water a day.  Continue with antibiotics as prescribed.  Continue checking her blood sugar pressure outside the office goal is less than 140/90.  If you develop chest pain, shortness of breath, nausea, vomiting, headache, or decreased urine output, please seek care immediately.  Otherwise, follow-up on Saturday for lab only and blood pressure check.  I will contact you over the weekend and let you know the lab results.  Contact heart care tomorrow and schedule appointment with [facility] or Dr. Alvester Chou.   If you have lab work done today you will be contacted with your lab results within the next 2 weeks.  If you have not heard from [place] then please contact us. The fastest way to get your results is to register for My Chart.   IF you received an x-ray today, you will receive an invoice from [facility]. Please contact [facility]iology at [phone] with questions or concerns regarding your invoice.   IF you received labwork today, you will receive an invoice from [facility]. Please contact [facility] at [phone] with questions or concerns regarding your invoice.   Our billing staff will not be able to assist you with questions regarding bills from these companies.  You will be contacted with the lab results as soon as they are available. The fastest way to get your results is to activate your My Chart account. Instructions are located on the last page of this paperwork. If you have not heard from [place] regarding the results in 2 weeks, please contact this office.

## 2017-09-01 NOTE — Telephone Encounter (Signed)
Called patient, no answer.  Left message for patient.  Her kidney function has worsened since she was last seen in the ER.  She needs to discontinue chlorthalidone and make sure she gets at least 64 ounces of water today.  She should come in earlier than her 5:40 appointment so we can repeat her labs and send them out stat.  She can call the office and asked to speak directly to me so I can talk about this.

## 2017-09-01 NOTE — Progress Notes (Signed)
Spoke with patient.  She will come back for lab only visit this morning.  Will send out stat.  Will return for 540 visit and go over labs at that time.  She reports drinking lots of water over the past day.

## 2017-09-03 ENCOUNTER — Ambulatory Visit (INDEPENDENT_AMBULATORY_CARE_PROVIDER_SITE_OTHER): Payer: Managed Care, Other (non HMO) | Admitting: Physician Assistant

## 2017-09-03 ENCOUNTER — Telehealth: Payer: Self-pay | Admitting: Osteopathic Medicine

## 2017-09-03 DIAGNOSIS — R7989 Other specified abnormal findings of blood chemistry: Secondary | ICD-10-CM

## 2017-09-03 LAB — BASIC METABOLIC PANEL
BUN/Creatinine Ratio: 16 (ref 9–23)
BUN: 32 mg/dL — ABNORMAL HIGH (ref 6–24)
CO2: 22 mmol/L (ref 20–29)
Calcium: 8.8 mg/dL (ref 8.7–10.2)
Chloride: 98 mmol/L (ref 96–106)
Creatinine, Ser: 1.98 mg/dL — ABNORMAL HIGH (ref 0.57–1.00)
GFR calc Af Amer: 32 mL/min/{1.73_m2} — ABNORMAL LOW (ref >59)
GFR calc non Af Amer: 28 mL/min/{1.73_m2} — ABNORMAL LOW (ref >59)
Glucose: 159 mg/dL — ABNORMAL HIGH (ref 65–99)
POTASSIUM: 3.8 mmol/L (ref 3.5–5.2)
SODIUM: 136 mmol/L (ref 134–144)

## 2017-09-03 NOTE — Telephone Encounter (Signed)
Had a moment at end of day so called patient. All questions answered. She hasn't heard back about nephro appt yet.

## 2017-09-03 NOTE — Telephone Encounter (Signed)
Reviewed labs and recent notes. Monitoring Creatinine for concern for acute kidney injury. It's a little bit better but basically stable. Would continue holding chlorthalidone and follow up w/ Leonie Douglas, PA-C as directed, as well as nephrologist.

## 2017-09-05 NOTE — Progress Notes (Signed)
Opened in error

## 2017-09-07 ENCOUNTER — Telehealth: Payer: Self-pay | Admitting: Family Medicine

## 2017-09-07 NOTE — Telephone Encounter (Signed)
Copied from [place] [id number]. Topic: General - Other >> Sep 07, 2017  8:32 AM Cecelia Byars, NT wrote: Reason for CRM: [employer]  informed choice team called and and needs a order for a low dose ct scan that was ordered by Dr Nolon Rod for the patient ,faxed to  [facility]  at [phone] their cb # [phone] they need this today please

## 2017-09-08 NOTE — Telephone Encounter (Signed)
Uriah called asking for the orders to be faxed today contact to advise

## 2017-09-16 ENCOUNTER — Other Ambulatory Visit: Payer: Self-pay | Admitting: Nephrology

## 2017-09-16 DIAGNOSIS — I129 Hypertensive chronic kidney disease with stage 1 through stage 4 chronic kidney disease, or unspecified chronic kidney disease: Secondary | ICD-10-CM

## 2017-09-16 DIAGNOSIS — N183 Chronic kidney disease, stage 3 unspecified: Secondary | ICD-10-CM

## 2017-09-16 DIAGNOSIS — N281 Cyst of kidney, acquired: Secondary | ICD-10-CM

## 2017-09-19 NOTE — Telephone Encounter (Signed)
Called pt this Morning to follow-up about appointment, LVM to call us back at the office if she still has not heard back about appointment.

## 2017-09-20 ENCOUNTER — Ambulatory Visit
Admission: RE | Admit: 2017-09-20 | Discharge: 2017-09-20 | Disposition: A | Discharge status: Home / Self Care | Payer: Managed Care, Other (non HMO) | Source: Ambulatory Visit | Attending: Nephrology | Admitting: Nephrology

## 2017-09-20 DIAGNOSIS — N281 Cyst of kidney, acquired: Secondary | ICD-10-CM

## 2017-09-20 DIAGNOSIS — I129 Hypertensive chronic kidney disease with stage 1 through stage 4 chronic kidney disease, or unspecified chronic kidney disease: Secondary | ICD-10-CM

## 2017-09-20 DIAGNOSIS — N183 Chronic kidney disease, stage 3 unspecified: Secondary | ICD-10-CM

## 2017-09-21 ENCOUNTER — Ambulatory Visit (INDEPENDENT_AMBULATORY_CARE_PROVIDER_SITE_OTHER): Payer: Managed Care, Other (non HMO) | Admitting: Cardiovascular Disease

## 2017-09-21 ENCOUNTER — Encounter: Payer: Self-pay | Admitting: Cardiovascular Disease

## 2017-09-21 VITALS — BP 200/88 | HR 72 | Ht 64.0 in | Wt 223.6 lb

## 2017-09-21 DIAGNOSIS — E785 Hyperlipidemia, unspecified: Secondary | ICD-10-CM | POA: Diagnosis not present

## 2017-09-21 DIAGNOSIS — I2510 Atherosclerotic heart disease of native coronary artery without angina pectoris: Secondary | ICD-10-CM

## 2017-09-21 DIAGNOSIS — I701 Atherosclerosis of renal artery: Secondary | ICD-10-CM | POA: Diagnosis not present

## 2017-09-21 NOTE — Assessment & Plan Note (Signed)
Recently discontinued tobacco abuse

## 2017-09-21 NOTE — Assessment & Plan Note (Signed)
History of noncritical CAD by cath in 2010 in 2013 as well as August 2014.  Patient denies chest pain or shortness of breath.

## 2017-09-21 NOTE — Assessment & Plan Note (Signed)
History of dyslipidemia intolerant to statin therapy with recent lipid profile performed 04/01/2016 revealing total cholesterol of 153, LDL 79 HDL 22.

## 2017-09-21 NOTE — Assessment & Plan Note (Signed)
History of hypertension with blood pressure difficult control on amlodipine, hydralazine, Toprol and indoor.  Her renal function is significantly worse than it has been in the past as well.  She is seeing a nephrologist.  I am going to get a renal Doppler study to rule out renal artery stenosis as a cause.

## 2017-09-21 NOTE — Progress Notes (Signed)
09/21/2017 [name]   07-18-1960  [id number]  Primary Physician Leonie Douglas, PA-C Primary Cardiologist: Lorretta Harp MD FACP, [place], [place], [place]  HPI:  [name] is a 57 y.o. moderately overweight female who I last saw in the office 09/29/2016.  She presented with a past medical history significant for CAD by cath in 2010 and in April 2013, and 2014.Marland Kitchen She had an abnormal Myoview suggesting anterior ischemia prior to her repeat cath in 2013. Cath has shown 60% LAD stenosis, 50% mid RCA stenosis, 70%, distal RCA stenosis with normal LV function. She has not seen [place] as an OP. 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 She denies vomiting, SOB, or diaphoresis. She denies any abdominal pain or bloating. She went to Urgent Care and was told they could not rule out a heart attack based on her EKG. She was transferred to [facility] ER by ambulance. NTG helped her symptoms en route and she has not had recurrence of chest pain. Her EKG here is normal with NSST changes and her Troponin is negative X 3. She underwent cardiac catheterization by Dr. Debara Pickett on 09/01/12 revealing unchanged anatomy from her prior catheter because of 13 with a 60% RCA lesion and otherwise scattered noncritical disease with normal LV function. Medical therapy was recommended. She has had no recurrent symptoms.   Since I saw her a year ago her major issues have been progressive renal insufficiency and difficult to control hypertension on multiple antihypertensive medications.  Her creatinine has risen from 1.3 up to 2 for unclear reasons.  She was recently started on lisinopril by her nephrologist.  She stopped smoking a month ago and is aware of salt avoidance.  Current Meds  Medication Sig  . albuterol (PROVENTIL HFA;VENTOLIN HFA) 108 (90 Base) MCG/ACT inhaler Inhale 2 puffs into the lungs every 4 (four) hours as needed.  Marland Kitchen amLODipine (NORVASC) 10  MG tablet Take 1 tablet (10 mg total) by mouth daily.  Marland Kitchen aspirin 81 MG tablet Take 81 mg by mouth at bedtime.  . benzonatate (TESSALON) 100 MG capsule Take 1-2 capsules (100-200 mg total) by mouth 3 (three) times daily as needed.  . hydrALAZINE (APRESOLINE) 100 MG tablet Take 1 tablet (100 mg total) by mouth 3 (three) times daily. Please call to make appointment for further refills.  . isosorbide mononitrate (IMDUR) 60 MG 24 hr tablet Take 60 mg by mouth daily.  . metoprolol succinate (TOPROL-XL) 100 MG 24 hr tablet Take 1 tablet (100 mg total) by mouth daily. Take with or immediately following a meal.  Ov needed for refills  . omega-3 acid ethyl esters (LOVAZA) 1 g capsule Take 2 capsules (2 g total) by mouth 2 (two) times daily.  . [DISCONTINUED] chlorthalidone (HYGROTON) 25 MG tablet Take 1 tablet (25 mg total) by mouth daily.  . [DISCONTINUED] doxycycline (VIBRAMYCIN) 100 MG capsule Take 1 capsule (100 mg total) by mouth 2 (two) times daily.  . [DISCONTINUED] ibuprofen (ADVIL,MOTRIN) 800 MG tablet Take 1 tablet (800 mg total) by mouth every 8 (eight) hours as needed.  . [DISCONTINUED] ipratropium (ATROVENT HFA) 17 MCG/ACT inhaler Inhale 2 puffs into the lungs every 6 (six) hours as needed for wheezing.  . [DISCONTINUED] isosorbide mononitrate (IMDUR) 30 MG 24 hr tablet TAKE 1 TABLET BY MOUTH EVERY DAY     Allergies  Allergen Reactions  . Bupropion     Other reaction(s): Other Suicidal  thoughts and increased depression  . Doxazosin Mesylate     Increased heart rate  . Rosuvastatin Other (See Comments)    myalgia  . Simvastatin     myalgias  . Wellbutrin [Bupropion Hcl]     unknown  . Zetia [Ezetimibe]     myalgias    Social History   Socioeconomic History  . Marital status: Divorced    Spouse name: Not on file  . Number of children: 0  . Years of education: Not on file  . Highest education level: Not on file  Occupational History  . Occupation: Lobbyist: [employer]  . Financial resource strain: Not on file  . Food insecurity:    Worry: Not on file    Inability: Not on file  . Transportation needs:    Medical: Not on file    Non-medical: Not on file  Tobacco Use  . Smoking status: Current Every Day Smoker    Packs/day: 1.00    Years: 30.00    Pack years: 30.00    Types: Cigarettes  . Smokeless tobacco: Never Used  . Tobacco comment: 4-5 cigarettes per day for the past two weeks  Substance and Sexual Activity  . Alcohol use: No    Alcohol/week: 0.0 standard drinks  . Drug use: No  . Sexual activity: Not Currently  Lifestyle  . Physical activity:    Days per week: Not on file    Minutes per session: Not on file  . Stress: Not on file  Relationships  . Social connections:    Talks on phone: Not on file    Gets together: Not on file    Attends religious service: Not on file    Active member of club or organization: Not on file    Attends meetings of clubs or organizations: Not on file    Relationship status: Not on file  . Intimate partner violence:    Fear of current or ex partner: Not on file    Emotionally abused: Not on file    Physically abused: Not on file    Forced sexual activity: Not on file  Other Topics Concern  . Not on file  Social History Narrative  . Not on file     Review of Systems: General: negative for chills, fever, night sweats or weight changes.  Cardiovascular: negative for chest pain, dyspnea on exertion, edema, orthopnea, palpitations, paroxysmal nocturnal dyspnea or shortness of breath Dermatological: negative for rash Respiratory: negative for cough or wheezing Urologic: negative for hematuria Abdominal: negative for nausea, vomiting, diarrhea, bright red blood per rectum, melena, or hematemesis Neurologic: negative for visual changes, syncope, or dizziness All other systems reviewed and are otherwise negative except as noted above.    Blood pressure (!) 200/88, pulse 72,  height 5\' 4"  (1.626 m), weight 223 lb 9.6 oz (101.4 kg), last menstrual period 12/14/2013.  General appearance: alert and no distress Neck: no adenopathy, no carotid bruit, no JVD, supple, symmetrical, trachea midline and thyroid not enlarged, symmetric, no tenderness/mass/nodules Lungs: clear to auscultation bilaterally Heart: regular rate and rhythm, S1, S2 normal, no murmur, click, rub or gallop Extremities: extremities normal, atraumatic, no cyanosis or edema Pulses: 2+ and symmetric Skin: Skin color, texture, turgor normal. No rashes or lesions Neurologic: Alert and oriented X 3, normal strength and tone. Normal symmetric reflexes. Normal coordination and gait  EKG not performed today  ASSESSMENT AND PLAN:   CAD (coronary artery disease)  History of noncritical CAD by cath in 2010 in 2013 as well as August 2014.  Patient denies chest pain or shortness of breath.  Dyslipidemia- low HDL History of dyslipidemia intolerant to statin therapy with recent lipid profile performed 04/01/2016 revealing total cholesterol of 153, LDL 79 HDL 22.  Tobacco abuse Recently discontinued tobacco abuse  Essential hypertension History of hypertension with blood pressure difficult control on amlodipine, hydralazine, Toprol and indoor.  Her renal function is significantly worse than it has been in the past as well.  She is seeing a nephrologist.  I am going to get a renal Doppler study to rule out renal artery stenosis as a cause.      Lorretta Harp MD FACP,FACC,FAHA, [facility] 09/21/2017 11:47 AM

## 2017-09-21 NOTE — Patient Instructions (Signed)
Medication Instructions:  Your physician recommends that you continue on your current medications as directed. Please refer to the Current Medication list given to you today.   Labwork: none  Testing/Procedures: Your physician has requested that you have a renal artery duplex. During this test, an ultrasound is used to evaluate blood flow to the kidneys. Allow one hour for this exam. Do not eat after midnight the day before and avoid carbonated beverages. Take your medications as you usually do.   Follow-Up: Your physician recommends that you schedule a follow-up appointment in: 1 month with Hypertension Clinic  Dr. Gwenlyn Found would like you to check your blood pressure DAILY for the next 4 weeks.  Keep a journal of these daily BP and heart rate reading and call our office with the results.    We request that you follow-up in: 6 months with Kerin Ransom, PA  and in 12 months with Dr Gwenlyn Found.  You will receive a reminder letter in the mail two months in advance. If you don't receive a letter, please call our office to schedule the follow-up appointment.   Low-Sodium Eating Plan Sodium, which is an element that makes up salt, helps you maintain a healthy balance of fluids in your body. Too much sodium can increase your blood pressure and cause fluid and waste to be held in your body. Your health care provider or dietitian may recommend following this plan if you have high blood pressure (hypertension), kidney disease, liver disease, or heart failure. Eating less sodium can help lower your blood pressure, reduce swelling, and protect your heart, liver, and kidneys. What are tips for following this plan? General guidelines  Most people on this plan should limit their sodium intake to 1,500-2,000 mg (milligrams) of sodium each day. Reading food labels  The Nutrition Facts label lists the amount of sodium in one serving of the food. If you eat more than one serving, you must multiply the listed  amount of sodium by the number of servings.  Choose foods with less than 140 mg of sodium per serving.  Avoid foods with 300 mg of sodium or more per serving. Shopping  Look for lower-sodium products, often labeled as "low-sodium" or "no salt added."  Always check the sodium content even if foods are labeled as "unsalted" or "no salt added".  Buy fresh foods. ? Avoid canned foods and premade or frozen meals. ? Avoid canned, cured, or processed meats  Buy breads that have less than 80 mg of sodium per slice. Cooking  Eat more home-cooked food and less restaurant, buffet, and fast food.  Avoid adding salt when cooking. Use salt-free seasonings or herbs instead of table salt or sea salt. Check with your health care provider or pharmacist before using salt substitutes.  Cook with plant-based oils, such as canola, sunflower, or olive oil. Meal planning  When eating at a restaurant, ask that your food be prepared with less salt or no salt, if possible.  Avoid foods that contain MSG (monosodium glutamate). MSG is sometimes added to [place] food, bouillon, and some canned foods. What foods are recommended? The items listed may not be a complete list. Talk with your dietitian about what dietary choices are best for you. Grains Low-sodium cereals, including oats, puffed wheat and rice, and shredded wheat. Low-sodium crackers. Unsalted rice. Unsalted pasta. Low-sodium bread. Whole-grain breads and whole-grain pasta. Vegetables Fresh or frozen vegetables. "No salt added" canned vegetables. "No salt added" tomato sauce and paste. Low-sodium or reduced-sodium tomato and  vegetable juice. Fruits Fresh, frozen, or canned fruit. Fruit juice. Meats and other protein foods Fresh or frozen (no salt added) meat, poultry, seafood, and fish. Low-sodium canned tuna and salmon. Unsalted nuts. Dried peas, beans, and lentils without added salt. Unsalted canned beans. Eggs. Unsalted nut  butters. Dairy Milk. Soy milk. Cheese that is naturally low in sodium, such as ricotta cheese, fresh mozzarella, or Swiss cheese Low-sodium or reduced-sodium cheese. Cream cheese. Yogurt. Fats and oils Unsalted butter. Unsalted margarine with no trans fat. Vegetable oils such as canola or olive oils. Seasonings and other foods Fresh and dried herbs and spices. Salt-free seasonings. Low-sodium mustard and ketchup. Sodium-free salad dressing. Sodium-free light mayonnaise. Fresh or refrigerated horseradish. Lemon juice. Vinegar. Homemade, reduced-sodium, or low-sodium soups. Unsalted popcorn and pretzels. Low-salt or salt-free chips. What foods are not recommended? The items listed may not be a complete list. Talk with your dietitian about what dietary choices are best for you. Grains Instant hot cereals. Bread stuffing, pancake, and biscuit mixes. Croutons. Seasoned rice or pasta mixes. Noodle soup cups. Boxed or frozen macaroni and cheese. Regular salted crackers. Self-rising flour. Vegetables Sauerkraut, pickled vegetables, and relishes. Olives. [place] fries. Onion rings. Regular canned vegetables (not low-sodium or reduced-sodium). Regular canned tomato sauce and paste (not low-sodium or reduced-sodium). Regular tomato and vegetable juice (not low-sodium or reduced-sodium). Frozen vegetables in sauces. Meats and other protein foods Meat or fish that is salted, canned, smoked, spiced, or pickled. Bacon, ham, sausage, hotdogs, corned beef, chipped beef, packaged lunch meats, salt pork, jerky, pickled herring, anchovies, regular canned tuna, sardines, salted nuts. Dairy Processed cheese and cheese spreads. Cheese curds. Blue cheese. Feta cheese. String cheese. Regular cottage cheese. Buttermilk. Canned milk. Fats and oils Salted butter. Regular margarine. Ghee. Bacon fat. Seasonings and other foods Onion salt, garlic salt, seasoned salt, table salt, and sea salt. Canned and packaged gravies.  Worcestershire sauce. Tartar sauce. Barbecue sauce. Teriyaki sauce. Soy sauce, including reduced-sodium. Steak sauce. Fish sauce. Oyster sauce. Cocktail sauce. Horseradish that you find on the shelf. Regular ketchup and mustard. Meat flavorings and tenderizers. Bouillon cubes. Hot sauce and Tabasco sauce. Premade or packaged marinades. Premade or packaged taco seasonings. Relishes. Regular salad dressings. Salsa. Potato and tortilla chips. Corn chips and puffs. Salted popcorn and pretzels. Canned or dried soups. Pizza. Frozen entrees and pot pies. Summary  Eating less sodium can help lower your blood pressure, reduce swelling, and protect your heart, liver, and kidneys.  Most people on this plan should limit their sodium intake to 1,500-2,000 mg (milligrams) of sodium each day.  Canned, boxed, and frozen foods are high in sodium. Restaurant foods, fast foods, and pizza are also very high in sodium. You also get sodium by adding salt to food.  Try to cook at home, eat more fresh fruits and vegetables, and eat less fast food, canned, processed, or prepared foods. This information is not intended to replace advice given to you by your health care provider. Make sure you discuss any questions you have with your health care provider. Document Released: 06/19/2001 Document Revised: 12/22/2015 Document Reviewed: 12/22/2015 Elsevier Interactive Patient Education  2018 [employer].   Any Other Special Instructions Will Be Listed Below (If Applicable).     If you need a refill on your cardiac medications before your next appointment, please call your pharmacy.

## 2017-09-27 ENCOUNTER — Other Ambulatory Visit: Payer: Self-pay | Admitting: Physician Assistant

## 2017-09-27 NOTE — Progress Notes (Signed)
[facility] [facility] is sending pt to have  renal ultrasound done

## 2017-09-27 NOTE — Progress Notes (Signed)
Please call patient and report that her lung cancer screening CT was negative for any suspicious lung nodules.  There was an incidental finding of a left kidney lesion, most consistent with a cyst but was indeterminate with this study.  The radiologist recommended renal ultrasound for further evaluation.  From reviewing her chart, it appears that Dr. Alvester Chou is ordering a renal artery stenosis test, which will likely examine this.  I am going to wait until after I see the results from this test to determine if we need to order additional renal ultrasound.  Please let me know if she has any questions or concerns.

## 2017-10-10 ENCOUNTER — Encounter (HOSPITAL_COMMUNITY): Payer: Managed Care, Other (non HMO)

## 2017-10-13 ENCOUNTER — Encounter: Payer: Self-pay | Admitting: Physician Assistant

## 2017-10-13 LAB — HM MAMMOGRAPHY

## 2017-10-21 ENCOUNTER — Ambulatory Visit (INDEPENDENT_AMBULATORY_CARE_PROVIDER_SITE_OTHER): Payer: Managed Care, Other (non HMO) | Admitting: Pharmacist

## 2017-10-21 ENCOUNTER — Ambulatory Visit (HOSPITAL_COMMUNITY)
Admission: RE | Admit: 2017-10-21 | Discharge: 2017-10-21 | Disposition: A | Discharge status: Home / Self Care | Payer: Managed Care, Other (non HMO) | Source: Ambulatory Visit | Attending: Cardiovascular Disease | Admitting: Cardiovascular Disease

## 2017-10-21 VITALS — BP 200/102 | HR 68

## 2017-10-21 DIAGNOSIS — I2510 Atherosclerotic heart disease of native coronary artery without angina pectoris: Secondary | ICD-10-CM | POA: Diagnosis present

## 2017-10-21 DIAGNOSIS — E785 Hyperlipidemia, unspecified: Secondary | ICD-10-CM | POA: Diagnosis present

## 2017-10-21 DIAGNOSIS — I10 Essential (primary) hypertension: Secondary | ICD-10-CM | POA: Diagnosis not present

## 2017-10-21 DIAGNOSIS — I701 Atherosclerosis of renal artery: Secondary | ICD-10-CM | POA: Diagnosis not present

## 2017-10-21 MED ORDER — CARVEDILOL 25 MG PO TABS: 25.0000 mg | ORAL_TABLET | Freq: Two times a day (BID) | ORAL | 1 refills | Status: DC

## 2017-10-21 NOTE — Progress Notes (Signed)
Patient ID: [name]                 DOB: Feb 17, 1960                      MRN: [id number]     HPI: [name] is a 57 y.o. female referred by Dr. Gwenlyn Found to HTN clinic. PMH positive for CAD (60% narrowing in RCA), hypertension, obesity, metabolic syndrome,  hyperlipidemia (with statin intolerance) and CKD. We worked with her in the past on both hypertension and hyperlipidemia.  Patient denies dizziness, headaches, blurry vision, swelling, chest pain or increased fatigue. Dr Gwenlyn Found is currently working on assessment for renal stenosis. Her nephrologist is also titrating her BP medication with most recent change only 10 days ago.  Noted her BP was better controlled while taking ARB plus chlorthalidone but this two drug were discontinued after worsening chronic kidney injury. Patient recently stopped smoking and report not taking her medication this morning  Blood Pressure Goal: 130/80  Current Medications:  amlodipine 10mg  daily  Lisinopril 10mg  daily - per nephrologist  hydralazine 100mg  TID  metoprolol tartrate 75mg  twice daily  Isosorbide 60mg  daily   Family History: Father died from stroke at time of CABG (age 38) Mother still living, had stent placed last year Brother had stent placed at age 97, doing well  Social History: Quit smoking 1 months ago- coffee at work (2-3 cups), soda 1 day, more on weekends  Diet: Lots of chicken, only rarely fried foods; more vegetables, if canned drains and rinses first; Some pastas but no breads  Exercise: Walking 5 days per week on afternoon break - 1 mile around parking lot  Home BP readings:  20 readings; average 192/105  Wrist cuff - not accurate is compared to manual reading  Wt Readings from Last 3 Encounters:  09/21/17 223 lb 9.6 oz (101.4 kg)  09/01/17 220 lb (99.8 kg)  08/31/17 220 lb (99.8 kg)   BP Readings from Last 3 Encounters:  10/21/17 (!) 200/102  09/21/17 (!)  200/88  09/01/17 (!) 180/82   Pulse Readings from Last 3 Encounters:  10/21/17 68  09/21/17 72  09/01/17 96    Past Medical History:  Diagnosis Date  . Angina   . CAD (coronary artery disease), non obstructive CAD in 2010    a. 2013 Cath: LAD 60, RCA 30m, 70d; b. 2014 Cath: stable anatomy.  . Diabetes mellitus without complication ([place])    a. 12/2014 HbA1c 6.5; b. Rx Glipizide - not taking.  . Family history of premature CAD   . Hx of adenomatous colonic polyps 06/11/2016  . Hyperlipidemia   . Hypertension   . Hypertensive heart disease   . Palpitations   . Reflux   . Tobacco abuse     Current Outpatient Medications on File Prior to Visit  Medication Sig Dispense Refill  . albuterol (PROVENTIL HFA;VENTOLIN HFA) 108 (90 Base) MCG/ACT inhaler Inhale 2 puffs into the lungs every 4 (four) hours as needed. 1 Inhaler 1  . amLODipine (NORVASC) 10 MG tablet Take 1 tablet (10 mg total) by mouth daily. 90 tablet 3  . aspirin 81 MG tablet Take 81 mg by mouth at bedtime.    . ergocalciferol (VITAMIN D2) 50000 units capsule Take 50,000 Units by mouth once a week.    . hydrALAZINE (APRESOLINE) 100 MG tablet Take 1 tablet (100 mg total) by mouth 3 (three) times daily. Please call to make appointment for further  refills. 270 tablet 0  . isosorbide mononitrate (IMDUR) 60 MG 24 hr tablet Take 60 mg by mouth daily.    Marland Kitchen omega-3 acid ethyl esters (LOVAZA) 1 g capsule Take 2 capsules (2 g total) by mouth 2 (two) times daily. 360 capsule 0  . benzonatate (TESSALON) 100 MG capsule Take 1-2 capsules (100-200 mg total) by mouth 3 (three) times daily as needed. (Patient not taking: Reported on 10/21/2017) 60 capsule 0   No current facility-administered medications on file prior to visit.     Allergies  Allergen Reactions  . Bupropion     Other reaction(s): Other Suicidal thoughts and increased depression  . Doxazosin Mesylate     Increased heart rate  . Rosuvastatin Other (See Comments)     myalgia  . Simvastatin     myalgias  . Wellbutrin [Bupropion Hcl]     unknown  . Zetia [Ezetimibe]     myalgias    Blood pressure (!) 200/102, pulse 68, last menstrual period 12/14/2013.  Essential hypertension Blood pressure is extremely elevated but patient remains asymptomatic. She reports not taking her medication this morning and occasionally forgetting the mid-day hydralazine dose.  Sadly, her home wrist cuff is not accurate either. We spent some time discussing medication compliance and lifestyle modifications. During medication reconciliation, multiple discrepancies on her medication list were noted and corrected.  Will STOP metoprolol tartrate, START carvedilol 25mg  twice daily, take hydralazine dose NOW (home medication available in the office), and work on medication compliance. Patient already has an appointment with PCP in 3 days and with nephrologist in 2 weeks. She was instructed to take her morning medication before going to morning OV to determine if medication is bringing BP down or not. She is to change home cuff ASAP, continue to monitor BP twice daily and bring records to follow up visit in 4 weeks.  Willena Jeancharles Rodriguez-Guzman PharmD, [place] [phone] 10/24/2017 7:55 AM

## 2017-10-21 NOTE — Patient Instructions (Signed)
Return for a  follow up appointment in 4 weeks  Check your blood pressure at home daily (if able) and keep record of the readings.  Take your BP meds as follows:  *STOP taking metoprolol* *START taking carvedilol 25mg  twice daily*  Bring all of your meds, your BP cuff and your record of home blood pressures to your next appointment.  Exercise as you're able, try to walk approximately 30 minutes per day.  Keep salt intake to a minimum, especially watch canned and prepared boxed foods.  Eat more fresh fruits and vegetables and fewer canned items.  Avoid eating in fast food restaurants.    HOW TO TAKE YOUR BLOOD PRESSURE: . Rest 5 minutes before taking your blood pressure. .  Don't smoke or drink caffeinated beverages for at least 30 minutes before. . Take your blood pressure before (not after) you eat. . Sit comfortably with your back supported and both feet on the floor (don't cross your legs). . Elevate your arm to heart level on a table or a desk. . Use the proper sized cuff. It should fit smoothly and snugly around your bare upper arm. There should be enough room to slip a fingertip under the cuff. The bottom edge of the cuff should be 1 inch above the crease of the elbow. . Ideally, take 3 measurements at one sitting and record the average.

## 2017-10-24 ENCOUNTER — Encounter

## 2017-10-24 ENCOUNTER — Encounter: Payer: Self-pay | Admitting: Physician Assistant

## 2017-10-24 ENCOUNTER — Ambulatory Visit (INDEPENDENT_AMBULATORY_CARE_PROVIDER_SITE_OTHER): Payer: Managed Care, Other (non HMO) | Admitting: Physician Assistant

## 2017-10-24 ENCOUNTER — Encounter: Payer: Self-pay | Admitting: Pharmacist

## 2017-10-24 ENCOUNTER — Other Ambulatory Visit: Payer: Self-pay

## 2017-10-24 VITALS — BP 217/111 | HR 61 | Temp 97.8°F | Resp 20 | Ht 63.19 in | Wt 222.8 lb

## 2017-10-24 DIAGNOSIS — E119 Type 2 diabetes mellitus without complications: Secondary | ICD-10-CM | POA: Diagnosis not present

## 2017-10-24 DIAGNOSIS — Z114 Encounter for screening for human immunodeficiency virus [HIV]: Secondary | ICD-10-CM

## 2017-10-24 DIAGNOSIS — I2510 Atherosclerotic heart disease of native coronary artery without angina pectoris: Secondary | ICD-10-CM

## 2017-10-24 DIAGNOSIS — IMO0001 Reserved for inherently not codable concepts without codable children: Secondary | ICD-10-CM | POA: Insufficient documentation

## 2017-10-24 DIAGNOSIS — E1129 Type 2 diabetes mellitus with other diabetic kidney complication: Secondary | ICD-10-CM | POA: Insufficient documentation

## 2017-10-24 DIAGNOSIS — E785 Hyperlipidemia, unspecified: Secondary | ICD-10-CM

## 2017-10-24 DIAGNOSIS — Z0001 Encounter for general adult medical examination with abnormal findings: Secondary | ICD-10-CM | POA: Diagnosis not present

## 2017-10-24 DIAGNOSIS — I10 Essential (primary) hypertension: Secondary | ICD-10-CM | POA: Diagnosis not present

## 2017-10-24 DIAGNOSIS — Z0000 Encounter for general adult medical examination without abnormal findings: Secondary | ICD-10-CM

## 2017-10-24 DIAGNOSIS — L602 Onychogryphosis: Secondary | ICD-10-CM

## 2017-10-24 DIAGNOSIS — E669 Obesity, unspecified: Secondary | ICD-10-CM

## 2017-10-24 DIAGNOSIS — Z1159 Encounter for screening for other viral diseases: Secondary | ICD-10-CM

## 2017-10-24 NOTE — Progress Notes (Signed)
[name]  MRN: [id number] DOB: Dec 10, 1960  Subjective:  Pt is a 57 y.o. female who presents for annual physical exam. Pt is fasting today.    Primary Preventative Screenings: Cervical Cancer: 04/03/2016, normal. Family Planning: Postmenopausal STI screening: Not currently sexually active, no concern for STDs. Breast Cancer: Up to date, had bx of left breast, no suspiocious results. Has repeat b/l mammogram in 03/2017 Colorectal Cancer: 06/04/2016, had one polyp removed, she remembers it was normal, cannot remember when she needs repeat, either 5-10 years Tobacco use/EtOH/substances: Just started QUIT NOW program. Down to 2 cigarettes a day. Had lung Ca screening in 09/2017, no suspicious lung nodules.  Bone Density: N/A Cardiac: Followed closely by cardiology.  Weight/Blood sugar/Diet/Exercise: Baking and cooking more at home. Avoiding extra salt. Drinks mostly water. Not much exercise. OTC/Vit/Supp/Herbal: OTC prilosec infrequently  Dentist/Optho:  "I need to go to the dentist because I have no teeth." Brushes BID. Eye doctor annual. Immunizations:  Tetanus: 2018  Influenza: 2018 Pnuemovax: 2014   Followed closely by nephrology and cardiology.  Was just seen by cardiology a few days ago and bp was running where it was today. She has kept a monthly log and it is typically this high. No symptoms. Being further evaluated for "blockages in the kidney."   Chronic Disorders: Her HTN, HLD are followed by her cardiologist, Dr. Gwenlyn Walter.  Has prediabetes. Diet controlled. Never been on medication.   Patient Active Problem List   Diagnosis Date Noted  . Diabetes mellitus without complication ([place])   . Hx of adenomatous colonic polyps 06/11/2016  . Diverticulosis 06/07/2016  . Sleep apnea 03/18/2016  . 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 Increased risk of breast cancer 12/17/2015  . Hypertensive heart disease   . Hematoma of groin - right, s/p  cath; stable on discharge 09/02/2012  . Pleuritic chest pain 08/31/2012  . Obesity (BMI 30-39.9) 08/31/2012  . Renal cyst- on CT in 2002 08/31/2012  . CAD (coronary artery disease) 04/15/2011  . Abnormal stress test, Lexiscan myoview 03/25/2011-sm. anterior ischemia 04/15/2011  . Essential hypertension 04/15/2011  . Dyslipidemia- low HDL 04/15/2011  . Metabolic syndrome 37/34/2876  . Tobacco abuse 04/15/2011  . Family history of premature CAD 04/15/2011    Current Outpatient Medications on File Prior to Visit  Medication Sig Dispense Refill  . albuterol (PROVENTIL HFA;VENTOLIN HFA) 108 (90 Base) MCG/ACT inhaler Inhale 2 puffs into the lungs every 4 (four) hours as needed. 1 Inhaler 1  . amLODipine (NORVASC) 10 MG tablet Take 1 tablet (10 mg total) by mouth daily. 90 tablet 3  . aspirin 81 MG tablet Take 81 mg by mouth at bedtime.    . ergocalciferol (VITAMIN D2) 50000 units capsule Take 50,000 Units by mouth once a week.    . hydrALAZINE (APRESOLINE) 100 MG tablet Take 1 tablet (100 mg total) by mouth 3 (three) times daily. Please call to make appointment for further refills. 270 tablet 0  . isosorbide mononitrate (IMDUR) 60 MG 24 hr tablet Take 60 mg by mouth daily.    Marland Kitchen omega-3 acid ethyl esters (LOVAZA) 1 g capsule Take 2 capsules (2 g total) by mouth 2 (two) times daily. 360 capsule 0  . benzonatate (TESSALON) 100 MG capsule Take 1-2 capsules (100-200 mg total) by mouth 3 (three) times daily as needed. (Patient not taking: Reported on 10/21/2017) 60 capsule 0  . carvedilol (COREG) 25 MG tablet Take 1 tablet (25 mg total) by  mouth 2 (two) times daily with a meal. To replace metoprolol (Patient not taking: Reported on 10/24/2017) 60 tablet 1  . lisinopril (PRINIVIL,ZESTRIL) 10 MG tablet Take 10 mg by mouth daily.  5   No current facility-administered medications on file prior to visit.     Allergies  Allergen Reactions  . Bupropion     Other reaction(s): Other Suicidal thoughts and  increased depression  . Doxazosin Mesylate     Increased heart rate  . Rosuvastatin Other (See Comments)    myalgia  . Simvastatin     myalgias  . Wellbutrin [Bupropion Hcl]     unknown  . Zetia [Ezetimibe]     myalgias    Social History   Socioeconomic History  . Marital status: Divorced    Spouse name: Not on file  . Number of children: 1  . Years of education: Not on file  . Highest education level: Not on file  Occupational History  . Occupation: Lobbyist: [employer]  . Financial resource strain: Somewhat hard  . Food insecurity:    Worry: Never true    Inability: Never true  . Transportation needs:    Medical: No    Non-medical: No  Tobacco Use  . Smoking status: Current Every Day Smoker    Packs/day: 0.25    Years: 30.00    Pack years: 7.50    Types: Cigarettes  . Smokeless tobacco: Never Used  . Tobacco comment: 2-  weeks  Substance and Sexual Activity  . Alcohol use: No    Alcohol/week: 0.0 standard drinks  . Drug use: No  . Sexual activity: Not Currently    Partners: Male  Lifestyle  . Physical activity:    Days per week: 0 days    Minutes per session: 0 min  . Stress: To some extent  Relationships  . Social connections:    Talks on phone: More than three times a week    Gets together: Once a week    Attends religious service: 1 to 4 times per year    Active member of club or organization: No    Attends meetings of clubs or organizations: Never    Relationship status: Divorced  Other Topics Concern  . Not on file  Social History Narrative  . Not on file    Past Surgical History:  Procedure Laterality Date  . APPENDECTOMY  1974  . CARDIAC CATHETERIZATION  02/07/2008   Recommendation-F/U stress test  . CARDIAC CATHETERIZATION  04/16/2011   Recommendation-Increase medical therapy trial  . CARDIOVASCULAR STRESS TEST  03/25/2011   Suspect subtle anterior septal ischemia  . FRACTURE SURGERY Left 1972   forearm  fracture  . LEFT HEART CATHETERIZATION WITH CORONARY ANGIOGRAM N/A 04/16/2011   Procedure: LEFT HEART CATHETERIZATION WITH CORONARY ANGIOGRAM;  Surgeon: Troy Sine, MD;  Location: [facility] CATH LAB;  Service: Cardiovascular;  Laterality: N/A;  . LEFT HEART CATHETERIZATION WITH CORONARY ANGIOGRAM N/A 09/01/2012   Procedure: LEFT HEART CATHETERIZATION WITH CORONARY ANGIOGRAM;  Surgeon: Pixie Casino, MD;  Location: [facility] CATH LAB;  Service: Cardiovascular;  Laterality: N/A;  . RENAL DOPPLER  06/05/2009   No evidence of significant diameter reduction  . TRANSTHORACIC ECHOCARDIOGRAM  01/25/2008   EF 60-65%, Moderate concentric LVH  . WRIST FRACTURE SURGERY Right ~ 1996    Family History  Problem Relation Age of Onset  . Stroke Father   . Hypertension Father   . Coronary artery disease  Brother   . Diabetes Mother   . Stroke Maternal Grandmother   . Cancer Maternal Grandmother        Breast cancer  . Hypertension Maternal Grandfather   . Heart attack Maternal Grandfather   . Heart attack Maternal Aunt 41  . Colon cancer Neg Hx   . Stomach cancer Neg Hx     Review of Systems  Constitutional: Negative for activity change, appetite change, chills, diaphoresis, fatigue, fever and unexpected weight change.  HENT: Negative for congestion, dental problem, drooling, ear discharge, ear pain, facial swelling, hearing loss, mouth sores, nosebleeds, postnasal drip, rhinorrhea, sinus pressure, sinus pain, sneezing, sore throat, tinnitus, trouble swallowing and voice change.   Eyes: Negative for photophobia, pain, discharge, redness, itching and visual disturbance.  Respiratory: Negative for apnea, cough, choking, chest tightness, shortness of breath, wheezing and stridor.   Cardiovascular: Negative for chest pain, palpitations and leg swelling.  Gastrointestinal: Negative for abdominal distention, abdominal pain, anal bleeding, blood in stool, constipation, diarrhea, nausea, rectal pain and vomiting.    Endocrine: Negative for cold intolerance, heat intolerance, polydipsia, polyphagia and polyuria.  Genitourinary: Negative for decreased urine volume, difficulty urinating, dyspareunia, dysuria, enuresis, flank pain, frequency, genital sores, hematuria, menstrual problem, pelvic pain, urgency, vaginal bleeding, vaginal discharge and vaginal pain.  Musculoskeletal: Negative for arthralgias, back pain, gait problem, joint swelling, myalgias, neck pain and neck stiffness.  Skin: Negative for color change, pallor, rash and wound.  Allergic/Immunologic: Negative for environmental allergies, food allergies and immunocompromised state.  Neurological: Negative for dizziness, tremors, seizures, syncope, facial asymmetry, speech difficulty, weakness, light-headedness, numbness and headaches.  Hematological: Negative for adenopathy. Does not bruise/bleed easily.  Psychiatric/Behavioral: Negative for agitation, behavioral problems, confusion, decreased concentration, dysphoric mood, hallucinations, self-injury, sleep disturbance and suicidal ideas. The patient is not nervous/anxious and is not hyperactive.     Objective:  BP (!) 217/111   Pulse 61   Temp 97.8 F (36.6 C) (Oral)   Resp 20   Ht 5' 3.19" (1.605 m)   Wt 222 lb 12.8 oz (101.1 kg)   LMP 12/14/2013   SpO2 95%   BMI 39.23 kg/m   Physical Exam  Constitutional: She is oriented to person, place, and time. She appears well-developed and well-nourished. No distress.  HENT:  Head: Normocephalic and atraumatic.  Right Ear: Hearing, tympanic membrane, external ear and ear canal normal.  Left Ear: Hearing, tympanic membrane, external ear and ear canal normal.  Nose: Nose normal.  Mouth/Throat: Uvula is midline, oropharynx is clear and moist and mucous membranes are normal. Abnormal dentition. Dental caries present. No oropharyngeal exudate.  Eyes: Pupils are equal, round, and reactive to light. Conjunctivae, EOM and lids are normal. No scleral  icterus.  Neck: Trachea normal and normal range of motion. No thyroid mass and no thyromegaly present.  Cardiovascular: Normal rate, regular rhythm, normal heart sounds and intact distal pulses.  Pulmonary/Chest: Effort normal and breath sounds normal. Right breast exhibits no inverted nipple, no mass, no nipple discharge, no skin change and no tenderness. Left breast exhibits no inverted nipple, no mass, no nipple discharge, no skin change and no tenderness. Breasts are symmetrical.  CMA chaperone present for breast exam.  Abdominal: Soft. Normal appearance and bowel sounds are normal. There is no tenderness.  Musculoskeletal:       Right foot: There is normal range of motion.       Left foot: There is normal range of motion.  Feet:  Right Foot:  Protective Sensation: 4  sites tested. 4 sites sensed.  Skin Integrity: Positive for callus (lateral aspect of 1st MTP). Negative for ulcer or skin breakdown.  Left Foot:  Protective Sensation: 4 sites tested. 4 sites sensed.  Skin Integrity: Negative for ulcer or skin breakdown.  Lymphadenopathy:       Head (right side): No tonsillar, no preauricular, no posterior auricular and no occipital adenopathy present.       Head (left side): No tonsillar, no preauricular, no posterior auricular and no occipital adenopathy present.    She has no cervical adenopathy.       Right: No supraclavicular adenopathy present.       Left: No supraclavicular adenopathy present.  Neurological: She is alert and oriented to person, place, and time. She has normal strength and normal reflexes.  Skin: Skin is warm and dry.  Bilateral toenails of the first and second digit are severely thickened. 2nd digit toenails are lengthy and starting to curl.  Cannot fully appreciate toenail color as patient has been on polish on.   Moderate coarse blond hair noted on chin and upper lip.  Few scattered skin tags noted on anterior chest.    Visual Acuity Screening   Right eye  Left eye Both eyes  Without correction:     With correction: 20/15 20/20 20/15      Diabetic Foot Exam - Simple   Simple Foot Form Visual Inspection No deformities, no ulcerations, no other skin breakdown bilaterally:  Yes Sensation Testing Intact to touch and monofilament testing bilaterally:  Yes Pulse Check Posterior Tibialis and Dorsalis pulse intact bilaterally:  Yes Comments    BP Readings from Last 3 Encounters:  10/24/17 (!) 217/111  10/21/17 (!) 200/102  09/21/17 (!) 200/88    Assessment and Plan :  Discussed healthy lifestyle, diet, exercise, preventative care, vaccinations, and addressed patient's concerns. Depending on labs, will discuss proper f/u. Otherwise, plan for specific conditions below.  1. Annual physical exam [place] lab results.   2. Diabetes mellitus without complication (HCC) - TSH - HM Diabetes Foot Exam - Ambulatory referral to Ophthalmology - Microalbumin/Creatinine Ratio, Urine - Urinalysis, dipstick only  3. Essential hypertension Severely uncontrolled.  Is followed very closely by cardiology hypertension clinic.  Was just evaluated 3 days ago and medication was adjusted.  Had similar reading then as today.  She is completely asymptomatic.  Has follow-up with nephrology in 2 weeks and cardiology in 4 weeks. - lisinopril (PRINIVIL,ZESTRIL) 10 MG tablet; Take 10 mg by mouth daily.; Refill: 5 - CBC with Differential/Platelet - CMP14+EGFR  4. Coronary artery disease involving native heart without angina pectoris, unspecified vessel or lesion type  5. Obesity (BMI 30-39.9)  6. Dyslipidemia- low HDL - Lipid panel  7. Need for hepatitis C screening test - Hepatitis C antibody  8. Screening for HIV (human immunodeficiency virus) - HIV Antibody (routine testing w rflx)  9. Thickened nails - Ambulatory referral to [place], PA-C  [facility] 10/24/2017 7:56 PM

## 2017-10-24 NOTE — Patient Instructions (Addendum)
   If you have lab work done today you will be contacted with your lab results within the next 2 weeks.  If you have not heard from us then please contact us. The fastest way to get your results is to register for My Chart.  Health Maintenance for Postmenopausal Women Menopause is a normal process in which your reproductive ability comes to an end. This process happens gradually over a span of months to years, usually between the ages of 48 and 55. Menopause is complete when you have missed 12 consecutive menstrual periods. It is important to talk with your health care provider about some of the most common conditions that affect postmenopausal women, such as heart disease, cancer, and bone loss (osteoporosis). Adopting a healthy lifestyle and getting preventive care can help to promote your health and wellness. Those actions can also lower your chances of developing some of these common conditions. What should I know about menopause? During menopause, you may experience a number of symptoms, such as:  Moderate-to-severe hot flashes.  Night sweats.  Decrease in sex drive.  Mood swings.  Headaches.  Tiredness.  Irritability.  Memory problems.  Insomnia.  Choosing to treat or not to treat menopausal changes is an individual decision that you make with your health care provider. What should I know about hormone replacement therapy and supplements? Hormone therapy products are effective for treating symptoms that are associated with menopause, such as hot flashes and night sweats. Hormone replacement carries certain risks, especially as you become older. If you are thinking about using estrogen or estrogen with progestin treatments, discuss the benefits and risks with your health care provider. What should I know about heart disease and stroke? Heart disease, heart attack, and stroke become more likely as you age. This may be due, in part, to the hormonal changes that your body  experiences during menopause. These can affect how your body processes dietary fats, triglycerides, and cholesterol. Heart attack and stroke are both medical emergencies. There are many things that you can do to help prevent heart disease and stroke:  Have your blood pressure checked at least every 1-2 years. High blood pressure causes heart disease and increases the risk of stroke.  If you are 55-79 years old, ask your health care provider if you should take aspirin to prevent a heart attack or a stroke.  Do not use any tobacco products, including cigarettes, chewing tobacco, or electronic cigarettes. If you need help quitting, ask your health care provider.  It is important to eat a healthy diet and maintain a healthy weight. ? Be sure to include plenty of vegetables, fruits, low-fat dairy products, and lean protein. ? Avoid eating foods that are high in solid fats, added sugars, or salt (sodium).  Get regular exercise. This is one of the most important things that you can do for your health. ? Try to exercise for at least 150 minutes each week. The type of exercise that you do should increase your heart rate and make you sweat. This is known as moderate-intensity exercise. ? Try to do strengthening exercises at least twice each week. Do these in addition to the moderate-intensity exercise.  Know your numbers.Ask your health care provider to check your cholesterol and your blood glucose. Continue to have your blood tested as directed by your health care provider.  What should I know about cancer screening? There are several types of cancer. Take the following steps to reduce your risk and to catch any   cancer development as early as possible. Breast Cancer  Practice breast self-awareness. ? This means understanding how your breasts normally appear and feel. ? It also means doing regular breast self-exams. Let your health care provider know about any changes, no matter how small.  If you  are 70 or older, have a clinician do a breast exam (clinical breast exam or CBE) every year. Depending on your age, family history, and medical history, it may be recommended that you also have a yearly breast X-ray (mammogram).  If you have a family history of breast cancer, talk with your health care provider about genetic screening.  If you are at high risk for breast cancer, talk with your health care provider about having an MRI and a mammogram every year.  Breast cancer (BRCA) gene test is recommended for women who have family members with BRCA-related cancers. Results of the assessment will determine the need for genetic counseling and BRCA1 and for BRCA2 testing. BRCA-related cancers include these types: ? Breast. This occurs in males or females. ? Ovarian. ? Tubal. This may also be called fallopian tube cancer. ? Cancer of the abdominal or pelvic lining (peritoneal cancer). ? Prostate. ? Pancreatic.  Cervical, Uterine, and Ovarian Cancer Your health care provider may recommend that you be screened regularly for cancer of the pelvic organs. These include your ovaries, uterus, and vagina. This screening involves a pelvic exam, which includes checking for microscopic changes to the surface of your cervix (Pap test).  For women ages 21-65, health care providers may recommend a pelvic exam and a Pap test every three years. For women ages 64-65, they may recommend the Pap test and pelvic exam, combined with testing for human papilloma virus (HPV), every five years. Some types of HPV increase your risk of cervical cancer. Testing for HPV may also be done on women of any age who have unclear Pap test results.  Other health care providers may not recommend any screening for nonpregnant women who are considered low risk for pelvic cancer and have no symptoms. Ask your health care provider if a screening pelvic exam is right for you.  If you have had past treatment for cervical cancer or a  condition that could lead to cancer, you need Pap tests and screening for cancer for at least 20 years after your treatment. If Pap tests have been discontinued for you, your risk factors (such as having a new sexual partner) need to be reassessed to determine if you should start having screenings again. Some women have medical problems that increase the chance of getting cervical cancer. In these cases, your health care provider may recommend that you have screening and Pap tests more often.  If you have a family history of uterine cancer or ovarian cancer, talk with your health care provider about genetic screening.  If you have vaginal bleeding after reaching menopause, tell your health care provider.  There are currently no reliable tests available to screen for ovarian cancer.  Lung Cancer Lung cancer screening is recommended for adults 30-29 years old who are at high risk for lung cancer because of a history of smoking. A yearly low-dose CT scan of the lungs is recommended if you:  Currently smoke.  Have a history of at least 30 pack-years of smoking and you currently smoke or have quit within the past 15 years. A pack-year is smoking an average of one pack of cigarettes per day for one year.  Yearly screening should:  Continue until  it has been 15 years since you quit.  Stop if you develop a health problem that would prevent you from having lung cancer treatment.  Colorectal Cancer  This type of cancer can be detected and can often be prevented.  Routine colorectal cancer screening usually begins at age 17 and continues through age 7.  If you have risk factors for colon cancer, your health care provider may recommend that you be screened at an earlier age.  If you have a family history of colorectal cancer, talk with your health care provider about genetic screening.  Your health care provider may also recommend using home test kits to check for hidden blood in your stool.  A  small camera at the end of a tube can be used to examine your colon directly (sigmoidoscopy or colonoscopy). This is done to check for the earliest forms of colorectal cancer.  Direct examination of the colon should be repeated every 5-10 years until age 31. However, if early forms of precancerous polyps or small growths are found or if you have a family history or genetic risk for colorectal cancer, you may need to be screened more often.  Skin Cancer  Check your skin from head to toe regularly.  Monitor any moles. Be sure to tell your health care provider: ? About any new moles or changes in moles, especially if there is a change in a mole's shape or color. ? If you have a mole that is larger than the size of a pencil eraser.  If any of your family members has a history of skin cancer, especially at a young age, talk with your health care provider about genetic screening.  Always use sunscreen. Apply sunscreen liberally and repeatedly throughout the day.  Whenever you are outside, protect yourself by wearing long sleeves, pants, a wide-brimmed hat, and sunglasses.  What should I know about osteoporosis? Osteoporosis is a condition in which bone destruction happens more quickly than new bone creation. After menopause, you may be at an increased risk for osteoporosis. To help prevent osteoporosis or the bone fractures that can happen because of osteoporosis, the following is recommended:  If you are 90-10 years old, get at least 1,000 mg of calcium and at least 600 mg of vitamin D per day.  If you are older than age 53 but younger than age 61, get at least 1,200 mg of calcium and at least 600 mg of vitamin D per day.  If you are older than age 82, get at least 1,200 mg of calcium and at least 800 mg of vitamin D per day.  Smoking and excessive alcohol intake increase the risk of osteoporosis. Eat foods that are rich in calcium and vitamin D, and do weight-bearing exercises several times  each week as directed by your health care provider. What should I know about how menopause affects my mental health? Depression may occur at any age, but it is more common as you become older. Common symptoms of depression include:  Low or sad mood.  Changes in sleep patterns.  Changes in appetite or eating patterns.  Feeling an overall lack of motivation or enjoyment of activities that you previously enjoyed.  Frequent crying spells.  Talk with your health care provider if you think that you are experiencing depression. What should I know about immunizations? It is important that you get and maintain your immunizations. These include:  Tetanus, diphtheria, and pertussis (Tdap) booster vaccine.  Influenza every year before the flu season  begins.  Pneumonia vaccine.  Shingles vaccine.  Your health care provider may also recommend other immunizations. This information is not intended to replace advice given to you by your health care provider. Make sure you discuss any questions you have with your health care provider. Document Released: 02/19/2005 Document Revised: 07/18/2015 Document Reviewed: 10/01/2014 Elsevier Interactive Patient Education  2018 [employer]   IF you received an x-ray today, you will receive an invoice from [facility]. Please contact [facility] at [phone] with questions or concerns regarding your invoice.   IF you received labwork today, you will receive an invoice from [facility]. Please contact [facility] at [phone] with questions or concerns regarding your invoice.   Our billing staff will not be able to assist you with questions regarding bills from these companies.  You will be contacted with the lab results as soon as they are available. The fastest way to get your results is to activate your My Chart account. Instructions are located on the last page of this paperwork. If you have not heard from us regarding the results in 2  weeks, please contact this office.     

## 2017-10-24 NOTE — Assessment & Plan Note (Signed)
Blood pressure is extremely elevated but patient remains asymptomatic. She reports not taking her medication this morning and occasionally forgetting the mid-day hydralazine dose.  Sadly, her home wrist cuff is not accurate either. We spent some time discussing medication compliance and lifestyle modifications. During medication reconciliation, multiple discrepancies on her medication list were noted and corrected.  Will STOP metoprolol tartrate, START carvedilol 25mg  twice daily, take hydralazine dose NOW (home medication available in the office), and work on medication compliance. Patient already has an appointment with PCP in 3 days and with nephrologist in 2 weeks. She was instructed to take her morning medication before going to morning OV to determine if medication is bringing BP down or not. She is to change home cuff ASAP, continue to monitor BP twice daily and bring records to follow up visit in 4 weeks.

## 2017-10-25 LAB — CMP14+EGFR
A/G RATIO: 1.4 (ref 1.2–2.2)
ALT: 18 IU/L (ref 0–32)
AST: 15 IU/L (ref 0–40)
Albumin: 3.9 g/dL (ref 3.5–5.5)
Alkaline Phosphatase: 53 IU/L (ref 39–117)
BUN/Creatinine Ratio: 11 (ref 9–23)
BUN: 17 mg/dL (ref 6–24)
CALCIUM: 8.9 mg/dL (ref 8.7–10.2)
CHLORIDE: 102 mmol/L (ref 96–106)
CO2: 22 mmol/L (ref 20–29)
Creatinine, Ser: 1.59 mg/dL — ABNORMAL HIGH (ref 0.57–1.00)
GFR, EST AFRICAN AMERICAN: 42 mL/min/{1.73_m2} — AB (ref >59)
GFR, EST NON AFRICAN AMERICAN: 36 mL/min/{1.73_m2} — AB (ref >59)
GLOBULIN, TOTAL: 2.7 g/dL (ref 1.5–4.5)
Glucose: 99 mg/dL (ref 65–99)
POTASSIUM: 4 mmol/L (ref 3.5–5.2)
SODIUM: 140 mmol/L (ref 134–144)
TOTAL PROTEIN: 6.6 g/dL (ref 6.0–8.5)

## 2017-10-25 LAB — CBC WITH DIFFERENTIAL/PLATELET
BASOS: 1 %
Basophils Absolute: 0.1 10*3/uL (ref 0.0–0.2)
EOS (ABSOLUTE): 0.5 10*3/uL — AB (ref 0.0–0.4)
EOS: 5 %
HEMATOCRIT: 39.2 % (ref 34.0–46.6)
Hemoglobin: 13 g/dL (ref 11.1–15.9)
IMMATURE GRANULOCYTES: 1 %
Immature Grans (Abs): 0 10*3/uL (ref 0.0–0.1)
LYMPHS ABS: 2.4 10*3/uL (ref 0.7–3.1)
Lymphs: 28 %
MCH: 27.9 pg (ref 26.6–33.0)
MCHC: 33.2 g/dL (ref 31.5–35.7)
MCV: 84 fL (ref 79–97)
MONOS ABS: 0.7 10*3/uL (ref 0.1–0.9)
Monocytes: 8 %
NEUTROS ABS: 5 10*3/uL (ref 1.4–7.0)
Neutrophils: 57 %
PLATELETS: 251 10*3/uL (ref 150–450)
RBC: 4.66 x10E6/uL (ref 3.77–5.28)
RDW: 13.5 % (ref 12.3–15.4)
WBC: 8.7 10*3/uL (ref 3.4–10.8)

## 2017-10-25 LAB — URINALYSIS, DIPSTICK ONLY
BILIRUBIN UA: NEGATIVE
GLUCOSE, UA: NEGATIVE
KETONES UA: NEGATIVE
Leukocytes, UA: NEGATIVE
Nitrite, UA: NEGATIVE
RBC, UA: NEGATIVE
Specific Gravity, UA: 1.015 (ref 1.005–1.030)
UUROB: 0.2 mg/dL (ref 0.2–1.0)
pH, UA: 6.5 (ref 5.0–7.5)

## 2017-10-25 LAB — HEPATITIS C ANTIBODY: Hep C Virus Ab: <0.1 s/co ratio (ref 0.0–0.9)

## 2017-10-25 LAB — LIPID PANEL
CHOL/HDL RATIO: 6.8 ratio — AB (ref 0.0–4.4)
Cholesterol, Total: 197 mg/dL (ref 100–199)
HDL: 29 mg/dL — ABNORMAL LOW (ref >39)
LDL Calculated: 108 mg/dL — ABNORMAL HIGH (ref 0–99)
Triglycerides: 302 mg/dL — ABNORMAL HIGH (ref 0–149)
VLDL Cholesterol Cal: 60 mg/dL — ABNORMAL HIGH (ref 5–40)

## 2017-10-25 LAB — MICROALBUMIN / CREATININE URINE RATIO
Creatinine, Urine: 67.6 mg/dL
MICROALB/CREAT RATIO: 4057.4 mg/g{creat} — AB (ref 0.0–30.0)
Microalbumin, Urine: 2742.8 ug/mL

## 2017-10-25 LAB — TSH: TSH: 2.89 u[IU]/mL (ref 0.450–4.500)

## 2017-10-25 LAB — HIV ANTIBODY (ROUTINE TESTING W REFLEX): HIV Screen 4th Generation wRfx: NONREACTIVE

## 2017-11-09 ENCOUNTER — Other Ambulatory Visit: Payer: Self-pay | Admitting: Cardiovascular Disease

## 2017-11-09 NOTE — Telephone Encounter (Signed)
Rx request sent to pharmacy.  

## 2017-11-13 NOTE — Progress Notes (Signed)
Patient ID: [name]                 DOB: 1960-08-03                      MRN: [id number]     HPI: [name] is a 57 y.o. female referred by Dr. Gwenlyn Found to HTN clinic. PMH positive for CAD (60% narrowing in RCA), hypertension, obesity, metabolic syndrome,  hyperlipidemia (with statin intolerance) and CKD. We worked with her in the past on both hypertension and hyperlipidemia.  Patient denies dizziness, headaches, blurry vision, swelling, chest pain or increased fatigue. Dr Gwenlyn Found ruled out renal stenosis. Her nephrologist is also titrating her BP medication with most recent change last week. Noted her BP was better controlled while taking ARB plus chlorthalidone but this two drug were discontinued after worsening chronic kidney injury. Patient reports feeling better this days and getting good BP readings of 130s and 140s at home.  Blood Pressure Goal: 130/80  Current Medications:  amlodipine 10mg  daily  Lisinopril 40mg  daily - per nephrologist  hydralazine 100mg  TID  Carvedilol 25mg  twice daily  Isosorbide 60mg  daily   Family History: Father died from stroke at time of CABG (age 70) Mother still living, had stent placed last year Brother had stent placed at age 14, doing well  Social History: Quit smoking 1 months ago- coffee at work (2-3 cups), soda 1 day, more on weekends  Diet: Lots of chicken, only rarely fried foods; more vegetables, if canned drains and rinses first; Some pastas but no breads  Exercise: Walking 2-3 times per week  Home BP readings: no records available today for assessment  Wt Readings from Last 3 Encounters:  10/24/17 222 lb 12.8 oz (101.1 kg)  09/21/17 223 lb 9.6 oz (101.4 kg)  09/01/17 220 lb (99.8 kg)   BP Readings from Last 3 Encounters:  11/14/17 (!) 148/82  10/24/17 (!) 217/111  10/21/17 (!) 200/102   Pulse Readings from Last 3 Encounters:  11/14/17 64  10/24/17 61  10/21/17 68      Past Medical History:  Diagnosis Date  . Angina   . CAD (coronary artery disease), non obstructive CAD in 2010    a. 2013 Cath: LAD 60, RCA 64m, 70d; b. 2014 Cath: stable anatomy.  . Diabetes mellitus without complication ([place])    a. 12/2014 HbA1c 6.5; b. Rx Glipizide - not taking.  . Family history of premature CAD   . Hx of adenomatous colonic polyps 06/11/2016  . Hyperlipidemia   . Hypertension   . Hypertensive heart disease   . Palpitations   . Reflux   . Tobacco abuse     Current Outpatient Medications on File Prior to Visit  Medication Sig Dispense Refill  . amLODipine (NORVASC) 10 MG tablet Take 1 tablet (10 mg total) by mouth daily. 90 tablet 3  . aspirin 81 MG tablet Take 81 mg by mouth at bedtime.    . carvedilol (COREG) 25 MG tablet Take 1 tablet (25 mg total) by mouth 2 (two) times daily with a meal. To replace metoprolol 60 tablet 1  . ergocalciferol (VITAMIN D2) 50000 units capsule Take 50,000 Units by mouth once a week.    . hydrALAZINE (APRESOLINE) 100 MG tablet TAKE 1 TABLET BY MOUTH THREE TIMES A DAY 270 tablet 0  . isosorbide mononitrate (IMDUR) 60 MG 24 hr tablet Take 60 mg by mouth daily.    Marland Kitchen omega-3 acid ethyl esters (  LOVAZA) 1 g capsule Take 2 capsules (2 g total) by mouth 2 (two) times daily. 360 capsule 0  . albuterol (PROVENTIL HFA;VENTOLIN HFA) 108 (90 Base) MCG/ACT inhaler Inhale 2 puffs into the lungs every 4 (four) hours as needed. (Patient not taking: Reported on 11/14/2017) 1 Inhaler 1  . benzonatate (TESSALON) 100 MG capsule Take 1-2 capsules (100-200 mg total) by mouth 3 (three) times daily as needed. (Patient not taking: Reported on 10/21/2017) 60 capsule 0   No current facility-administered medications on file prior to visit.     Allergies  Allergen Reactions  . Bupropion     Other reaction(s): Other Suicidal thoughts and increased depression  . Doxazosin Mesylate     Increased heart rate  . Rosuvastatin Other (See Comments)    myalgia   . Simvastatin     myalgias  . Wellbutrin [Bupropion Hcl]     unknown  . Zetia [Ezetimibe]     myalgias    Blood pressure (!) 148/82, pulse 64, last menstrual period 12/14/2013.  Essential hypertension  BP remains above desired goal of 130/80, but greatly improved from previous readings of 093O and 671I systolic. I changed her metoprolol for carvedilol during last OV and her nephrologist increase Lisinopril to 40mg  daily. Will continue current medication therapy without changes. Patient is working on positive lifestyle modifications and following up closely (every 2 weeks) with nephrologist. Will defer further BP medication adjustment to nephrologist and follow up as needed.  Aeriel Boulay Rodriguez-Guzman PharmD, [place] [id number] 11/15/2017 7:23 AM

## 2017-11-14 ENCOUNTER — Ambulatory Visit (INDEPENDENT_AMBULATORY_CARE_PROVIDER_SITE_OTHER): Payer: Managed Care, Other (non HMO) | Admitting: Pharmacist

## 2017-11-14 VITALS — BP 148/82 | HR 64

## 2017-11-14 DIAGNOSIS — I10 Essential (primary) hypertension: Secondary | ICD-10-CM

## 2017-11-14 MED ORDER — LISINOPRIL 40 MG PO TABS: 40.0000 mg | ORAL_TABLET | Freq: Every day | ORAL | 11 refills | Status: DC

## 2017-11-14 NOTE — Patient Instructions (Signed)
Return for a  follow up appointment in as needed  Check your blood pressure at home daily (if able) and keep record of the readings.  Take your BP meds as follows: *NO MEDICATION CHANGES*  Bring all of your meds, your BP cuff and your record of home blood pressures to your next appointment.  Exercise as you're able, try to walk approximately 30 minutes per day.  Keep salt intake to a minimum, especially watch canned and prepared boxed foods.  Eat more fresh fruits and vegetables and fewer canned items.  Avoid eating in fast food restaurants.    HOW TO TAKE YOUR BLOOD PRESSURE: . Rest 5 minutes before taking your blood pressure. .  Don't smoke or drink caffeinated beverages for at least 30 minutes before. . Take your blood pressure before (not after) you eat. . Sit comfortably with your back supported and both feet on the floor (don't cross your legs). . Elevate your arm to heart level on a table or a desk. . Use the proper sized cuff. It should fit smoothly and snugly around your bare upper arm. There should be enough room to slip a fingertip under the cuff. The bottom edge of the cuff should be 1 inch above the crease of the elbow. . Ideally, take 3 measurements at one sitting and record the average.

## 2017-11-15 ENCOUNTER — Encounter: Payer: Self-pay | Admitting: Pharmacist

## 2017-11-15 NOTE — Assessment & Plan Note (Signed)
BP remains above desired goal of 130/80, but greatly improved from previous readings of 967E and 938B systolic. I changed her metoprolol for carvedilol during last OV and her nephrologist increase Lisinopril to 40mg  daily. Will continue current medication therapy without changes. Patient is working on positive lifestyle modifications and following up closely (every 2 weeks) with nephrologist. Will defer further BP medication adjustment to nephrologist and follow up as needed.

## 2017-11-18 ENCOUNTER — Ambulatory Visit (INDEPENDENT_AMBULATORY_CARE_PROVIDER_SITE_OTHER): Payer: Managed Care, Other (non HMO) | Admitting: Podiatry

## 2017-11-18 ENCOUNTER — Encounter: Payer: Self-pay | Admitting: Podiatry

## 2017-11-18 DIAGNOSIS — L600 Ingrowing nail: Secondary | ICD-10-CM | POA: Diagnosis not present

## 2017-11-18 DIAGNOSIS — B351 Tinea unguium: Secondary | ICD-10-CM | POA: Diagnosis not present

## 2017-11-18 DIAGNOSIS — M79675 Pain in left toe(s): Secondary | ICD-10-CM | POA: Diagnosis not present

## 2017-11-18 DIAGNOSIS — L84 Corns and callosities: Secondary | ICD-10-CM

## 2017-11-18 DIAGNOSIS — M79674 Pain in right toe(s): Secondary | ICD-10-CM

## 2017-11-18 MED ORDER — NEOMYCIN-POLYMYXIN-HC 3.5-10000-1 OT SOLN: OTIC | 0 refills | Status: DC

## 2017-11-18 NOTE — Patient Instructions (Signed)

## 2017-11-21 ENCOUNTER — Other Ambulatory Visit: Payer: Self-pay | Admitting: Cardiovascular Disease

## 2017-11-21 NOTE — Progress Notes (Signed)
Subjective:   Patient ID: [name], female   DOB: 57 y.o.   MRN: [id number]   HPI Patient presents with very thickened nail with the left big toenail being the worst.  States that she has tried to trim it and has tried different modalities without relief and she has a number of nails that are really bothering her but the left is bothering her the most and has not responded conservatively to wider shoes or so trim technique.  Patient does not smoke and likes to be active   Review of Systems  All other systems reviewed and are negative.       Objective:  Physical Exam  Constitutional: She appears well-developed and well-nourished.  Cardiovascular: Intact distal pulses.  Pulmonary/Chest: Effort normal.  Musculoskeletal: Normal range of motion.  Neurological: She is alert.  Skin: Skin is warm.  Nursing note and vitals reviewed.   Neurovascular status found to be intact muscle strength is adequate range of motion within normal limits with patient found to have a thickened incurvated left hallux nail medial border that is very painful when pressed and make shoe gear difficult.  She has no drainage or redness noted and other nails are quite thickened and hard to cut but they are not the source of this left big toenail is.  Patient has good digital perfusion and is well oriented x3     Assessment:  Ingrown toenail deformity left hallux medial border with pain     Plan:  H&P conditions reviewed and recommended removal of the corner explaining ultimately she may remove the entire nail.  Patient wants this procedure read consent form and signed understanding risk and today I infiltrated the left hallux 60 mg Xylocaine Marcaine mixture under sterile conditions with sterile instrumentation I remove the medial border exposed matrix and applied phenol 3 applications 30 seconds followed by alcohol lavage sterile dressing.  Gave instructions on soaks and prescribed Corticosporin otic solution  and also explained what she should do if any throbbing were to occur and if that does occur take the dressing off and if not leave on 24 hours.  She is encouraged to call with any questions will be checked back in the next several weeks

## 2017-12-02 ENCOUNTER — Ambulatory Visit (INDEPENDENT_AMBULATORY_CARE_PROVIDER_SITE_OTHER): Payer: Managed Care, Other (non HMO) | Admitting: Podiatry

## 2017-12-02 ENCOUNTER — Encounter: Payer: Self-pay | Admitting: Podiatry

## 2017-12-02 DIAGNOSIS — L600 Ingrowing nail: Secondary | ICD-10-CM

## 2017-12-02 DIAGNOSIS — Z9889 Other specified postprocedural states: Secondary | ICD-10-CM

## 2017-12-04 NOTE — Progress Notes (Signed)
Subjective:   Patient ID: [name], female   DOB: 57 y.o.   MRN: [id number]   HPI Patient states nail is healing very well with minimal discomfort   ROS      Objective:  Physical Exam  Neurovascular status intact with patient's left heel healed well crusted over with no erythema or drainage     Assessment:  Chronic nail left hallux that is healing well     Plan:  Discharge patient see back to recheck

## 2017-12-07 ENCOUNTER — Encounter: Payer: Self-pay | Admitting: Physician Assistant

## 2018-02-02 ENCOUNTER — Encounter: Payer: Self-pay | Admitting: Cardiology

## 2018-02-02 ENCOUNTER — Ambulatory Visit (INDEPENDENT_AMBULATORY_CARE_PROVIDER_SITE_OTHER): Payer: Managed Care, Other (non HMO) | Admitting: Cardiology

## 2018-02-02 VITALS — BP 152/86 | HR 66 | Ht 63.0 in | Wt 225.8 lb

## 2018-02-02 DIAGNOSIS — I10 Essential (primary) hypertension: Secondary | ICD-10-CM | POA: Diagnosis not present

## 2018-02-02 DIAGNOSIS — E785 Hyperlipidemia, unspecified: Secondary | ICD-10-CM

## 2018-02-02 DIAGNOSIS — I119 Hypertensive heart disease without heart failure: Secondary | ICD-10-CM

## 2018-02-02 DIAGNOSIS — N183 Chronic kidney disease, stage 3 unspecified: Secondary | ICD-10-CM

## 2018-02-02 DIAGNOSIS — I2510 Atherosclerotic heart disease of native coronary artery without angina pectoris: Secondary | ICD-10-CM | POA: Diagnosis not present

## 2018-02-02 DIAGNOSIS — G4733 Obstructive sleep apnea (adult) (pediatric): Secondary | ICD-10-CM

## 2018-02-02 NOTE — Assessment & Plan Note (Signed)
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 She is followed by a nephrologist at [facility]

## 2018-02-02 NOTE — Progress Notes (Addendum)
02/02/2018 [name]   [birth year]  [id number]  Primary Physician Leonie Douglas, PA-C Primary Cardiologist: Dr Gwenlyn Found  HPI:  58I.o. obese Caucasian female, with a past medical history significant for non obstructive CAD. She has been cathed 3 times- 2010, 2013, and Aug 2014. Cath done 09/01/12 showed a 60% RCA narrowing. Other problems include HTN, obesity with metabolic syndrome, prior smoking, HLD with statin intolerance, and CRI-3. She is in the office for routine follow up. She has had problems in the past with her B/P and medication compliance.  She was followed for a time by the pharmacist in our B/P clinic and then turned over to her nephrologist. This appears to be under better control.  Her renal function has improved- SCr Oct 2019-1.59.  She is followed by [facility].    Since we saw her last she has done well from a cardiac standpoint.  She denies any chest pain or unusual dyspnea.  She has had some aching and burning in her thighs that she thinks may be attributed to Coreg. She is still working two jobs.  Her son is 87 y/o now and playing basketball for [employer].  Between work, basketball games and practices she has no time to exercise and admits she doesn't eat as healthy as she could either.  Her weight is unchanged.  She did have a sleep study in the past but says "my insurance wouldn't pay for the split night study".  I'll do some further research on this.    Current Outpatient Medications  Medication Sig Dispense Refill  . albuterol (PROVENTIL HFA;VENTOLIN HFA) 108 (90 Base) MCG/ACT inhaler Inhale 2 puffs into the lungs every 4 (four) hours as needed. 1 Inhaler 1  . amLODipine (NORVASC) 10 MG tablet Take 1 tablet (10 mg total) by mouth daily. 90 tablet 3  . aspirin 81 MG tablet Take 81 mg by mouth at bedtime.    . carvedilol (COREG) 25 MG tablet TAKE 1 TABLET (25 MG TOTAL) BY MOUTH 2 (TWO) TIMES DAILY WITH A MEAL. TO REPLACE METOPROLOL 60 tablet 5   . ergocalciferol (VITAMIN D2) 50000 units capsule Take 50,000 Units by mouth every 30 (thirty) days.     . hydrALAZINE (APRESOLINE) 100 MG tablet TAKE 1 TABLET BY MOUTH THREE TIMES A DAY 270 tablet 0  . isosorbide mononitrate (IMDUR) 60 MG 24 hr tablet Take 60 mg by mouth daily.    Marland Kitchen lisinopril (PRINIVIL,ZESTRIL) 40 MG tablet Take 1 tablet (40 mg total) by mouth daily. 30 tablet 11  . omega-3 acid ethyl esters (LOVAZA) 1 g capsule Take 2 capsules (2 g total) by mouth 2 (two) times daily. 360 capsule 0   No current facility-administered medications for this visit.     Allergies  Allergen Reactions  . Bupropion     Other reaction(s): Other Suicidal thoughts and increased depression  . Doxazosin Mesylate     Increased heart rate  . Rosuvastatin Other (See Comments)    myalgia  . Simvastatin     myalgias  . Wellbutrin [Bupropion Hcl]     unknown  . Zetia [Ezetimibe]     myalgias    Past Medical History:  Diagnosis Date  . Angina   . CAD (coronary artery disease), non obstructive CAD in 2010    a. 2013 Cath: LAD 60, RCA 46m, 70d; b. 2014 Cath: stable anatomy.  . Diabetes mellitus without complication ([place])    a. 12/2014 HbA1c 6.5; b. Rx Glipizide -  not taking.  . Family history of premature CAD   . Hx of adenomatous colonic polyps 06/11/2016  . Hyperlipidemia   . Hypertension   . Hypertensive heart disease   . Palpitations   . Reflux   . Tobacco abuse     Social History   Socioeconomic History  . Marital status: Divorced    Spouse name: Not on file  . Number of children: 1  . Years of education: Not on file  . Highest education level: Not on file  Occupational History  . Occupation: Lobbyist: [employer] [place]  . Financial resource strain: Somewhat hard  . Food insecurity:    Worry: Never true    Inability: Never true  . Transportation needs:    Medical: No    Non-medical: No  Tobacco Use  . Smoking status: Current Every Day Smoker     Packs/day: 0.25    Years: 30.00    Pack years: 7.50    Types: Cigarettes  . Smokeless tobacco: Never Used  . Tobacco comment: 2-  weeks  Substance and Sexual Activity  . Alcohol use: No    Alcohol/week: 0.0 standard drinks  . Drug use: No  . Sexual activity: Not Currently    Partners: Male  Lifestyle  . Physical activity:    Days per week: 0 days    Minutes per session: 0 min  . Stress: To some extent  Relationships  . Social connections:    Talks on phone: More than three times a week    Gets together: Once a week    Attends religious service: 1 to 4 times per year    Active member of club or organization: No    Attends meetings of clubs or organizations: Never    Relationship status: Divorced  . Intimate partner violence:    Fear of current or ex partner: Patient refused    Emotionally abused: Patient refused    Physically abused: Patient refused    Forced sexual activity: Patient refused  Other Topics Concern  . Not on file  Social History Narrative  . Not on file     Family History  Problem Relation Age of Onset  . Stroke Father   . Hypertension Father   . Coronary artery disease Brother   . Diabetes Mother   . Stroke Maternal Grandmother   . Cancer Maternal Grandmother        Breast cancer  . Hypertension Maternal Grandfather   . Heart attack Maternal Grandfather   . Heart attack Maternal Aunt 41  . Colon cancer Neg Hx   . Stomach cancer Neg Hx      Review of Systems: General: negative for chills, fever, night sweats or weight changes.  Cardiovascular: negative for chest pain, dyspnea on exertion, edema, orthopnea, palpitations, paroxysmal nocturnal dyspnea or shortness of breath Dermatological: negative for rash Respiratory: negative for cough or wheezing Urologic: negative for hematuria Abdominal: negative for nausea, vomiting, diarrhea, bright red blood per rectum, melena, or hematemesis Neurologic: negative for visual changes, syncope, or  dizziness All other systems reviewed and are otherwise negative except as noted above.    Blood pressure (!) 152/86, pulse 66, height 5\' 3"  (1.6 m), weight 225 lb 12.8 oz (102.4 kg), last menstrual period 12/14/2013.  General appearance: alert, cooperative, no distress and moderately obese Neck: no carotid bruit and no JVD Lungs: clear to auscultation bilaterally and scoliosis Heart: regular rate and rhythm Extremities: no edema,  3+ pulses Skin: warm and dry Neurologic: Grossly normal  EKG NSR, inferior lateral TWI (chronic)  ASSESSMENT AND PLAN:   Essential hypertension Fair control- 140/70 by me with large cuff. She has not had her medications yet  CAD (coronary artery disease) Cath 2010, 2013, and Aug 2014 after abnormal Myoview- moderate disease- 60% mRCA, no significant LAD or CFX disease  Dyslipidemia- LDL goal < 70 Statin intolerance  CRI (chronic renal insufficiency), stage 3 (moderate) (HCC) Patent renal arteries with multiple renal cysts on [place]. She is followed by a nephrologist at [place] [facility]  Sleep apnea It appears her study was two years ago. The patient has poorly controlled HTN, obesity, and asthmatic COPD-on inhalers PRN.  We'll try and get her split night study done, if unable she may need a new study done.    PLAN  Same Rx- myalgia is possible with Coreg (6%). I encouraged her to continue this and take Tylenol PRN.  F/U Dr Gwenlyn Found in 6 months.   Kerin Ransom PA-C 02/02/2018 8:45 AM

## 2018-02-02 NOTE — Assessment & Plan Note (Signed)
Cath 2010, 2013, and Aug 2014 after abnormal Myoview- moderate disease- 60% mRCA, no significant LAD or CFX disease

## 2018-02-02 NOTE — Assessment & Plan Note (Signed)
I'll check with Mariann Laster about denial of her split night study.

## 2018-02-02 NOTE — Assessment & Plan Note (Signed)
Fair control- 140/70 by me with large cuff. She has not had her medications yet

## 2018-02-02 NOTE — Assessment & Plan Note (Signed)
Statin intolerance 

## 2018-02-02 NOTE — Patient Instructions (Signed)
Medication Instructions:  Your physician recommends that you continue on your current medications as directed. Please refer to the Current Medication list given to you today.  If you need a refill on your cardiac medications before your next appointment, please call your pharmacy.   Lab work: None  If you have labs (blood work) drawn today and your tests are completely normal, you will receive your results only by: Marland Kitchen MyChart Message (if you have MyChart) OR . A paper copy in the mail If you have any lab test that is abnormal or we need to change your treatment, we will call you to review the results.  Testing/Procedures: None   Follow-Up: At [facility], you and your health needs are our priority.  As part of our continuing mission to provide you with exceptional heart care, we have created designated Provider Care Teams.  These Care Teams include your primary Cardiologist (physician) and Advanced Practice Providers (APPs -  Physician Assistants and Nurse Practitioners) who all work together to provide you with the care you need, when you need it. You will need a follow up appointment in 6 months.  Please call our office 2 months in advance to schedule this appointment.  You may see Quay Burow, MD or one of the following Advanced Practice Providers on your designated Care Team:   Kerin Ransom, PA-C Roby Lofts, [place] . Sande Rives, PA-C  Any Other Special Instructions Will Be Listed Below (If Applicable).

## 2018-02-13 ENCOUNTER — Other Ambulatory Visit: Payer: Self-pay | Admitting: Cardiovascular Disease

## 2018-03-25 ENCOUNTER — Ambulatory Visit: Payer: Managed Care, Other (non HMO) | Admitting: Family Medicine

## 2018-04-06 ENCOUNTER — Other Ambulatory Visit: Payer: Self-pay | Admitting: Cardiovascular Disease

## 2018-04-17 ENCOUNTER — Other Ambulatory Visit: Payer: Self-pay

## 2018-04-17 ENCOUNTER — Ambulatory Visit
Admission: EM | Admit: 2018-04-17 | Discharge: 2018-04-17 | Disposition: A | Discharge status: Home / Self Care | Payer: Managed Care, Other (non HMO) | Attending: Family Medicine | Admitting: Family Medicine

## 2018-04-17 ENCOUNTER — Ambulatory Visit (INDEPENDENT_AMBULATORY_CARE_PROVIDER_SITE_OTHER): Payer: Managed Care, Other (non HMO)

## 2018-04-17 ENCOUNTER — Encounter: Payer: Self-pay | Admitting: Family Medicine

## 2018-04-17 DIAGNOSIS — S92356A Nondisplaced fracture of fifth metatarsal bone, unspecified foot, initial encounter for closed fracture: Secondary | ICD-10-CM | POA: Diagnosis not present

## 2018-04-17 NOTE — ED Provider Notes (Addendum)
[name]    CSN: [id number] Arrival date & time: 04/17/18  1340     History   Chief Complaint Chief Complaint  Patient presents with  . Foot Injury    HPI [name] is a 58 y.o. female.   This is the initial visit for this 58 year old woman to [facility] slowly urgent Walter.  She presents today with a right foot injury.  She struck the lateral right foot against a bureau in the middle of the night with persistent pain and swelling since.  Injury occurred over a week ago.  No ankle pain.  No skin breaks.  Patient's past medical history is significant for diabetes, coronary artery disease, hypertension, hyperlipidemia, and tobacco abuse.  She is known to have chronic renal insufficiency and sleep apnea as well.     Past Medical History:  Diagnosis Date  . Angina   . CAD (coronary artery disease), non obstructive CAD in 2010    a. 2013 Cath: LAD 60, RCA 30m, 70d; b. 2014 Cath: stable anatomy.  . Diabetes mellitus without complication ([place])    a. 12/2014 HbA1c 6.5; b. Rx Glipizide - not taking.  . Family history of premature CAD   . Hx of adenomatous colonic polyps 06/11/2016  . Hyperlipidemia   . Hypertension   . Hypertensive heart disease   . Palpitations   . Reflux   . Tobacco abuse     Patient Active Problem List   Diagnosis Date Noted  . 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)   . Hx of adenomatous colonic polyps 06/11/2016  . Diverticulosis 06/07/2016  . Sleep apnea 03/18/2016  . CRI (chronic renal insufficiency), stage 3 (moderate) ([place]) 03/18/2016  . Increased risk of breast cancer 12/17/2015  . Hypertensive heart disease   . Hematoma of groin - right, s/p cath; stable on discharge 09/02/2012  . Pleuritic chest pain 08/31/2012  . Obesity (BMI 30-39.9) 08/31/2012  . CAD (coronary artery disease) 04/15/2011  . Abnormal stress test, Lexiscan myoview 03/25/2011-sm. anterior ischemia 04/15/2011  . Essential  hypertension 04/15/2011  . Dyslipidemia- LDL goal < 70 04/15/2011  . Metabolic syndrome 37/29/0211  . Tobacco abuse 04/15/2011  . Family history of premature CAD 04/15/2011    Past Surgical History:  Procedure Laterality Date  . APPENDECTOMY  1974  . CARDIAC CATHETERIZATION  02/07/2008   Recommendation-F/U stress test  . CARDIAC CATHETERIZATION  04/16/2011   Recommendation-Increase medical therapy trial  . CARDIOVASCULAR STRESS TEST  03/25/2011   Suspect subtle anterior septal ischemia  . FRACTURE SURGERY Left 1972   forearm fracture  . LEFT HEART CATHETERIZATION WITH CORONARY ANGIOGRAM N/A 04/16/2011   Procedure: LEFT HEART CATHETERIZATION WITH CORONARY ANGIOGRAM;  Surgeon: Troy Sine, MD;  Location: [facility] CATH LAB;  Service: Cardiovascular;  Laterality: N/A;  . LEFT HEART CATHETERIZATION WITH CORONARY ANGIOGRAM N/A 09/01/2012   Procedure: LEFT HEART CATHETERIZATION WITH CORONARY ANGIOGRAM;  Surgeon: Pixie Casino, MD;  Location: [facility] CATH LAB;  Service: Cardiovascular;  Laterality: N/A;  . RENAL DOPPLER  06/05/2009   No evidence of significant diameter reduction  . TRANSTHORACIC ECHOCARDIOGRAM  01/25/2008   EF 60-65%, Moderate concentric LVH  . WRIST FRACTURE SURGERY Right ~ 1996    OB History   No obstetric history on file.      Home Medications    Prior to Admission medications   Medication Sig Start Date End Date Taking? Authorizing Provider  albuterol (PROVENTIL HFA;VENTOLIN HFA) 108 (90 Base) MCG/ACT inhaler Inhale 2  puffs into the lungs every 4 (four) hours as needed. 08/23/17   Jaynee Eagles, PA-C  amLODipine (NORVASC) 10 MG tablet Take 1 tablet (10 mg total) by mouth daily. 03/18/16   Erlene Quan, PA-C  aspirin 81 MG tablet Take 81 mg by mouth at bedtime.    [provider]  carvedilol (COREG) 25 MG tablet Take 1 tablet (25 mg total) by mouth 2 (two) times daily with a meal. To replace metoprolol 04/06/18   Lorretta Harp, MD  ergocalciferol (VITAMIN D2) 50000  units capsule Take 50,000 Units by mouth every 30 (thirty) days.     [provider]  hydrALAZINE (APRESOLINE) 100 MG tablet TAKE 1 TABLET BY MOUTH THREE TIMES A DAY 02/13/18   Lorretta Harp, MD  isosorbide mononitrate (IMDUR) 60 MG 24 hr tablet Take 60 mg by mouth daily.    [provider]  lisinopril (PRINIVIL,ZESTRIL) 40 MG tablet Take 1 tablet (40 mg total) by mouth daily. 11/14/17   Lorretta Harp, MD  omega-3 acid ethyl esters (LOVAZA) 1 g capsule Take 2 capsules (2 g total) by mouth 2 (two) times daily. 07/26/17   Lorretta Harp, MD    Family History Family History  Problem Relation Age of Onset  . Stroke Father   . Hypertension Father   . Coronary artery disease Brother   . Diabetes Mother   . Stroke Maternal Grandmother   . Cancer Maternal Grandmother        Breast cancer  . Hypertension Maternal Grandfather   . Heart attack Maternal Grandfather   . Heart attack Maternal Aunt 41  . Colon cancer Neg Hx   . Stomach cancer Neg Hx     Social History Social History   Tobacco Use  . Smoking status: Current Every Day Smoker    Packs/day: 0.25    Years: 30.00    Pack years: 7.50    Types: Cigarettes  . Smokeless tobacco: Never Used  . Tobacco comment: 2-  weeks  Substance Use Topics  . Alcohol use: No    Alcohol/week: 0.0 standard drinks  . Drug use: No     Allergies   Bupropion; Doxazosin mesylate; Rosuvastatin; Simvastatin; Wellbutrin [bupropion hcl]; and Zetia [ezetimibe]   Review of Systems Review of Systems  Neurological: Negative.   All other systems reviewed and are negative.    Physical Exam Triage Vital Signs ED Triage Vitals  Enc Vitals Group     BP      Pulse      Resp      Temp      Temp src      SpO2      Weight      Height      Head Circumference      Peak Flow      Pain Score      Pain Loc      Pain Edu?      Excl. in [place]?    No data found.  Updated Vital Signs BP (!) 182/81 (BP Location: Left Arm)    Pulse 87   Temp 97.9 F (36.6 C) (Oral)   Resp 18   LMP 12/14/2013   SpO2 100%    Physical Exam Vitals signs and nursing note reviewed.  Constitutional:      Appearance: Normal appearance. She is obese.  HENT:     Head: Normocephalic.     Mouth/Throat:     Mouth: Mucous membranes are moist.  Eyes:     Conjunctiva/sclera: Conjunctivae normal.     Pupils: Pupils are equal, round, and reactive to light.  Neck:     Musculoskeletal: Normal range of motion and neck supple.  Cardiovascular:     Rate and Rhythm: Normal rate and regular rhythm.     Pulses: Normal pulses.  Pulmonary:     Effort: Pulmonary effort is normal.  Musculoskeletal:        General: Swelling present.     Comments: Mild swelling and ecchymosis over lateral right foot with tenderness over the 5th metatarsal.  Good color, sensation, and DP pulse  Skin:    General: Skin is warm and dry.     Capillary Refill: Capillary refill takes less than 2 seconds.  Neurological:     Mental Status: She is alert.     Gait: Gait abnormal.  Psychiatric:        Mood and Affect: Mood normal.        Behavior: Behavior normal.        Thought Content: Thought content normal.        Judgment: Judgment normal.      UC Treatments / Results  Labs (all labs ordered are listed, but only abnormal results are displayed) Labs Reviewed - No data to display  EKG None  Radiology Dg Foot Complete Right  Result Date: 04/17/2018 CLINICAL DATA:  Continued pain post impacting Right lateral foot on dresser 11 days ago. Pain, swelling, and discoloration around proximal 5th metatarsal. Constant throbbing pain, sharp pain when bearing weight. Pain shoots into distal 5th metatarsal when bearing weight. Initial encounter. Diabetic, controlled. EXAM: RIGHT FOOT COMPLETE - 3+ VIEW COMPARISON:  None. FINDINGS: There is a transverse nondisplaced fracture of the distal fifth metacarpal. No other fractures.  No bone lesions. Joints are normally  aligned. Minimal plantar calcaneal spur. Mild forefoot soft tissue swelling. IMPRESSION: Nondisplaced fracture of the right distal fifth metacarpal. Electronically Signed   By: Lajean Manes M.D.   On: 04/17/2018 14:16    Procedures Procedures (including critical Walter time)  Medications Ordered in UC Medications - No data to display  Initial Impression / Assessment and Plan / UC Course  I have reviewed the triage vital signs and the nursing notes.  Pertinent labs & imaging results that were available during my Walter of the patient were reviewed by me and considered in my medical decision making (see chart for details).    Final Clinical Impressions(s) / UC Diagnoses   Final diagnoses:  Closed nondisplaced fracture of fifth metatarsal bone, initial encounter     Discharge Instructions     Call your orthopedist for a follow up appointment this week    ED Prescriptions    None     Controlled Substance Prescriptions [place] Controlled Substance Registry consulted? Not Applicable   Robyn Haber, MD 04/17/18 1423    Robyn Haber, MD 04/17/18 [phone]

## 2018-04-17 NOTE — Discharge Instructions (Signed)
Call your orthopedist for a follow up appointment this week

## 2018-04-17 NOTE — ED Triage Notes (Signed)
Pt states hit her rt foot on her dresser 11days ago, has iced it for over a week. States now the side of her foot is throbbing.

## 2018-07-21 ENCOUNTER — Telehealth: Payer: Self-pay

## 2018-07-21 NOTE — Telephone Encounter (Signed)
       COVID-19 Pre-Screening Questions:  . In the past 7 to 10 days have you had a cough,  shortness of breath, headache, congestion, fever (100 or greater) body aches, chills, sore throat, or sudden loss of taste or sense of smell? NO .  Marland Kitchen Have you been around anyone with known Covid 19? PT HAS COWORKER WHO WORKS A DIFFERENT SHIFT THAN SHE DOES THAT SHE SOMETIMES SEES IN PASSING, BUT RARELY.  COWORKER TESTED POSITIVE ABOUT 3 WEEKS AGO BUT HAS SINCE THEN RETURNED TO WORK AFTER TESTING NEGATIVE.  PT STATES THE ONLY TIME IN CONTACT WITH COWORKER IS AROUND CLOCK-IN/CLOCK-OUT TIME . Have you been around anyone who is awaiting Covid 19 test results in the past 7 to 10 days? NO . Have you been around anyone who has been exposed to Covid 19, or has mentioned symptoms of Covid 19 within the past 7 to 10 days? YES; PT HAD COWORKER WHO TESTED POSITIVE ABOUT 3 WEEKS AGO AND HAS RETURNED TO WORK SINCE TESTING NEGATIVE  If you have any concerns/questions about symptoms patients report during screening (either on the phone or at threshold). Contact the provider seeing the patient or DOD for further guidance. If neither are available contact a member of the leadership team.   Pt is ok with converting 7/17 appt to OV. She is aware that she will need to wear a mask for appt and is aware of guest restrictions.

## 2018-07-27 ENCOUNTER — Telehealth: Payer: Self-pay | Admitting: Cardiovascular Disease

## 2018-07-27 NOTE — Telephone Encounter (Signed)

## 2018-07-28 ENCOUNTER — Ambulatory Visit (INDEPENDENT_AMBULATORY_CARE_PROVIDER_SITE_OTHER): Payer: Managed Care, Other (non HMO) | Admitting: Cardiovascular Disease

## 2018-07-28 ENCOUNTER — Other Ambulatory Visit: Payer: Self-pay

## 2018-07-28 ENCOUNTER — Encounter: Payer: Self-pay | Admitting: Cardiovascular Disease

## 2018-07-28 DIAGNOSIS — G4733 Obstructive sleep apnea (adult) (pediatric): Secondary | ICD-10-CM

## 2018-07-28 DIAGNOSIS — I2510 Atherosclerotic heart disease of native coronary artery without angina pectoris: Secondary | ICD-10-CM

## 2018-07-28 DIAGNOSIS — E785 Hyperlipidemia, unspecified: Secondary | ICD-10-CM

## 2018-07-28 DIAGNOSIS — I10 Essential (primary) hypertension: Secondary | ICD-10-CM

## 2018-07-28 MED ORDER — AMLODIPINE BESYLATE 10 MG PO TABS: 10.0000 mg | ORAL_TABLET | Freq: Every day | ORAL | 3 refills | Status: DC

## 2018-07-28 NOTE — Assessment & Plan Note (Signed)
History of essential hypertension with blood pressure measured today at 200/100 although she is under a lot of stress.  She is had renal Dopplers in the past which were normal.  She checks her blood pressures at home usually running 140/80.  She is on amlodipine, carvedilol, hydralazine and lisinopril.

## 2018-07-28 NOTE — Progress Notes (Signed)
07/28/2018 [name]   Sep 09, 1960  [id number]  Primary Physician Leonie Douglas, PA-C Primary Cardiologist: Lorretta Harp MD FACP, [place], [place], [place]  HPI:  [name] is a 58 y.o.  moderately overweight female who I last saw in the office 09/21/2017.  She presented with a past medical history significant for CAD by cath in 2010 and in April 2013,and 2014.Marland Kitchen She had an abnormal Myoview suggesting anterior ischemia prior to her repeat cath in 2013. Cath has shown 60% LAD stenosis, 50% mid RCA stenosis, 70%, distal RCA stenosis with normal LV function. She has not seen [place] as an OP. 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 She denies vomiting, SOB, or diaphoresis. She denies any abdominal pain or bloating. She went to Urgent Care and was told they could not rule out a heart attack based on her EKG. She was transferred to [facility] ER by ambulance. NTG helped her symptoms en route and she has not had recurrence of chest pain. Her EKG here is normal with NSST changes and her Troponin is negative X 3. She underwent cardiac catheterization by Dr. Debara Pickett on 09/01/12 revealing unchanged anatomy from her prior catheter because of 13 with a 60% RCA lesion and otherwise scattered noncritical disease with normal LV function. Medical therapy was recommended. She has had no recurrent symptoms.  Since I saw her a year ago her major issues have been progressive renal insufficiency and difficult to control hypertension on multiple antihypertensive medications.  Her creatinine has risen from 1.3 up to 2 for unclear reasons.  She was recently started on lisinopril by her nephrologist.  She stopped smoking last year and is aware of salt avoidance.  I did obtain renal Doppler studies that showed no evidence of renal artery stenosis although she did have some renal cysts.  She denies chest pain or shortness of breath.  She does have  symptoms of obstructive sleep apnea and has had a positive home sleep study although never pursued CPAP.  She also has hyperlipidemia intolerant to statin therapy.  Current Meds  Medication Sig   albuterol (PROVENTIL HFA;VENTOLIN HFA) 108 (90 Base) MCG/ACT inhaler Inhale 2 puffs into the lungs every 4 (four) hours as needed.   amLODipine (NORVASC) 10 MG tablet Take 1 tablet (10 mg total) by mouth daily.   aspirin 81 MG tablet Take 81 mg by mouth at bedtime.   carvedilol (COREG) 25 MG tablet Take 1 tablet (25 mg total) by mouth 2 (two) times daily with a meal. To replace metoprolol   Cholecalciferol (VITAMIN D3 PO) Take 2,000 Units by mouth.   hydrALAZINE (APRESOLINE) 100 MG tablet TAKE 1 TABLET BY MOUTH THREE TIMES A DAY   isosorbide mononitrate (IMDUR) 60 MG 24 hr tablet Take 60 mg by mouth daily.   lisinopril (PRINIVIL,ZESTRIL) 40 MG tablet Take 1 tablet (40 mg total) by mouth daily.   omega-3 acid ethyl esters (LOVAZA) 1 g capsule Take 2 capsules (2 g total) by mouth 2 (two) times daily.   [DISCONTINUED] amLODipine (NORVASC) 10 MG tablet Take 1 tablet (10 mg total) by mouth daily.   [DISCONTINUED] ergocalciferol (VITAMIN D2) 50000 units capsule Take 50,000 Units by mouth every 30 (thirty) days.      Allergies  Allergen Reactions   Bupropion     Other reaction(s): Other Suicidal thoughts and increased depression   Doxazosin Mesylate     Increased heart rate  Rosuvastatin Other (See Comments)    myalgia   Simvastatin     myalgias   Wellbutrin [Bupropion Hcl]     unknown   Zetia [Ezetimibe]     myalgias    Social History   Socioeconomic History   Marital status: Divorced    Spouse name: Not on file   Number of children: 1   Years of education: Not on file   Highest education level: Not on file  Occupational History   Occupation: Lobbyist: [employer]  Social Needs   Financial resource strain: Somewhat hard   Food insecurity     Worry: Never true    Inability: Never true   Transportation needs    Medical: No    Non-medical: No  Tobacco Use   Smoking status: Current Every Day Smoker    Packs/day: 0.25    Years: 30.00    Pack years: 7.50    Types: Cigarettes   Smokeless tobacco: Never Used   Tobacco comment: 2-  weeks  Substance and Sexual Activity   Alcohol use: No    Alcohol/week: 0.0 standard drinks   Drug use: No   Sexual activity: Not Currently    Partners: Male  Lifestyle   Physical activity    Days per week: 0 days    Minutes per session: 0 min   Stress: To some extent  Relationships   Social connections    Talks on phone: More than three times a week    Gets together: Once a week    Attends religious service: 1 to 4 times per year    Active member of club or organization: No    Attends meetings of clubs or organizations: Never    Relationship status: Divorced   Intimate partner violence    Fear of current or ex partner: Patient refused    Emotionally abused: Patient refused    Physically abused: Patient refused    Forced sexual activity: Patient refused  Other Topics Concern   Not on file  Social History Narrative   Not on file     Review of Systems: General: negative for chills, fever, night sweats or weight changes.  Cardiovascular: negative for chest pain, dyspnea on exertion, edema, orthopnea, palpitations, paroxysmal nocturnal dyspnea or shortness of breath Dermatological: negative for rash Respiratory: negative for cough or wheezing Urologic: negative for hematuria Abdominal: negative for nausea, vomiting, diarrhea, bright red blood per rectum, melena, or hematemesis Neurologic: negative for visual changes, syncope, or dizziness All other systems reviewed and are otherwise negative except as noted above.    Blood pressure (!) 200/100, pulse 68, temperature (!) 97.3 F (36.3 C), height 5\' 4"  (1.626 m), weight 219 lb (99.3 kg), last menstrual period 12/14/2013,  SpO2 97 %.  General appearance: alert and no distress Neck: no adenopathy, no carotid bruit, no JVD, supple, symmetrical, trachea midline and thyroid not enlarged, symmetric, no tenderness/mass/nodules Lungs: clear to auscultation bilaterally Heart: regular rate and rhythm, S1, S2 normal, no murmur, click, rub or gallop Extremities: extremities normal, atraumatic, no cyanosis or edema Pulses: 2+ and symmetric Skin: Skin color, texture, turgor normal. No rashes or lesions Neurologic: Alert and oriented X 3, normal strength and tone. Normal symmetric reflexes. Normal coordination and gait  EKG sinus rhythm 66 with lateral T wave inversion unchanged from prior EKGs..  I personally reviewed this EKG.  ASSESSMENT AND PLAN:   CAD (coronary artery disease) History of CAD status post multiple catheterizations in 2010,  13 and 14 all of which so a 60% RCA narrowing but otherwise no changes.  She denies chest pain or shortness of breath.  Essential hypertension History of essential hypertension with blood pressure measured today at 200/100 although she is under a lot of stress.  She is had renal Dopplers in the past which were normal.  She checks her blood pressures at home usually running 140/80.  She is on amlodipine, carvedilol, hydralazine and lisinopril.  Dyslipidemia- LDL goal < 70 History of dyslipidemia, intolerant to statin therapy with lipid profile performed 10/24/2017 revealing total cholesterol 97, LDL 108 and HDL of 29.  She may be a candidate for Repatha which we will explore.  Sleep apnea History of positive home sleep study several years ago though she did was not able to obtain CPAP.  I suspect she would benefit from this given her body habitus, daytime somnolence and snoring along with hypertension.  I am referring her to Dr. Claiborne Billings for further evaluation.      Lorretta Harp MD FACP,FACC,FAHA, [facility] 07/28/2018 4:21 PM

## 2018-07-28 NOTE — Assessment & Plan Note (Signed)
History of dyslipidemia, intolerant to statin therapy with lipid profile performed 10/24/2017 revealing total cholesterol 97, LDL 108 and HDL of 29.  She may be a candidate for Repatha which we will explore.

## 2018-07-28 NOTE — Assessment & Plan Note (Signed)
History of CAD status post multiple catheterizations in 2010, 13 and 14 all of which so a 60% RCA narrowing but otherwise no changes.  She denies chest pain or shortness of breath.

## 2018-07-28 NOTE — Addendum Note (Signed)
Addended by: Annita Brod on: 07/28/2018 04:50 PM   Modules accepted: Orders

## 2018-07-28 NOTE — Patient Instructions (Addendum)
Medication Instructions:  Your physician recommends that you continue on your current medications as directed. Please refer to the Current Medication list given to you today.  If you need a refill on your cardiac medications before your next appointment, please call your pharmacy.   Lab work: Your physician recommends that you return for lab work WITHIN 1 WEEK: LIPID PANEL  If you have labs (blood work) drawn today and your tests are completely normal, you will receive your results only by: Marland Kitchen MyChart Message (if you have MyChart) OR . A paper copy in the mail If you have any lab test that is abnormal or we need to change your treatment, we will call you to review the results.  Testing/Procedures: NONE  Follow-Up: At [facility], you and your health needs are our priority.  As part of our continuing mission to provide you with exceptional heart care, we have created designated Provider Care Teams.  These Care Teams include your primary Cardiologist (physician) and Advanced Practice Providers (APPs -  Physician Assistants and Nurse Practitioners) who all work together to provide you with the care you need, when you need it. . You will need a follow up appointment in 6 months [place], PA-C AND IN 12 MONTHS WITH DR. Gwenlyn Found.  Please call our office 2 months in advance to schedule this appointment.   Any Other Special Instructions Will Be Listed Below (If Applicable). REFERRAL TO DR. Shelva Majestic FOR OBSTRUCTIVE SLEEP APNEA EVALUATION

## 2018-07-28 NOTE — Assessment & Plan Note (Signed)
History of positive home sleep study several years ago though she did was not able to obtain CPAP.  I suspect she would benefit from this given her body habitus, daytime somnolence and snoring along with hypertension.  I am referring her to Dr. Claiborne Billings for further evaluation.

## 2018-08-01 NOTE — Addendum Note (Signed)
Addended by: Diana Eves on: 08/01/2018 02:20 PM   Modules accepted: Orders

## 2018-08-14 ENCOUNTER — Telehealth: Payer: Self-pay

## 2018-08-14 NOTE — Telephone Encounter (Signed)
Called and spoke w/pt regarding needing labs pt stated that they keep forgetting and will come friday

## 2018-09-25 ENCOUNTER — Telehealth: Payer: Self-pay

## 2018-09-25 NOTE — Telephone Encounter (Signed)
Called and spoke w/pt regarding overdue for labs pt stated that they are out of town and that they will do it when they can. Awaiting labs for a repatha pa and pt notifed for 2nd time.

## 2018-10-03 ENCOUNTER — Telehealth: Payer: Self-pay

## 2018-10-03 NOTE — Telephone Encounter (Signed)
3rd and final attempt to contact the pt regarding needing lipid labs for a pa and the pt didn't answer. Will place the pt file under [id number] until the pt calls back and lets [place] know if she has had the labs

## 2018-10-05 LAB — LIPID PANEL
Chol/HDL Ratio: 6.3 ratio — ABNORMAL HIGH (ref 0.0–4.4)
Cholesterol, Total: 183 mg/dL (ref 100–199)
HDL: 29 mg/dL — ABNORMAL LOW (ref >39)
LDL Chol Calc (NIH): 110 mg/dL — ABNORMAL HIGH (ref 0–99)
Triglycerides: 251 mg/dL — ABNORMAL HIGH (ref 0–149)
VLDL Cholesterol Cal: 44 mg/dL — ABNORMAL HIGH (ref 5–40)

## 2018-10-07 ENCOUNTER — Other Ambulatory Visit: Payer: Self-pay | Admitting: Cardiovascular Disease

## 2018-10-11 ENCOUNTER — Telehealth: Payer: Self-pay

## 2018-10-11 MED ORDER — REPATHA SURECLICK 140 MG/ML ~~LOC~~ SOAJ: 140.0000 mg | SUBCUTANEOUS | 11 refills | Status: DC

## 2018-10-11 NOTE — Telephone Encounter (Signed)
Called lmomed pt and stated that pa approved rx sent instructed the pt to call us back if unaffordable or if they have questions

## 2018-10-12 ENCOUNTER — Other Ambulatory Visit: Payer: Self-pay

## 2018-10-12 ENCOUNTER — Encounter: Payer: Self-pay | Admitting: Cardiovascular Disease

## 2018-10-12 ENCOUNTER — Ambulatory Visit (INDEPENDENT_AMBULATORY_CARE_PROVIDER_SITE_OTHER): Payer: Managed Care, Other (non HMO) | Admitting: Cardiovascular Disease

## 2018-10-12 VITALS — BP 160/84 | HR 67 | Temp 97.1°F | Ht 64.0 in | Wt 219.0 lb

## 2018-10-12 DIAGNOSIS — E785 Hyperlipidemia, unspecified: Secondary | ICD-10-CM

## 2018-10-12 DIAGNOSIS — I10 Essential (primary) hypertension: Secondary | ICD-10-CM | POA: Diagnosis not present

## 2018-10-12 DIAGNOSIS — E668 Other obesity: Secondary | ICD-10-CM

## 2018-10-12 DIAGNOSIS — I2510 Atherosclerotic heart disease of native coronary artery without angina pectoris: Secondary | ICD-10-CM

## 2018-10-12 DIAGNOSIS — G4733 Obstructive sleep apnea (adult) (pediatric): Secondary | ICD-10-CM | POA: Diagnosis not present

## 2018-10-12 NOTE — Patient Instructions (Addendum)
Medication Instructions:  The current medical regimen is effective;  continue present plan and medications.  If you need a refill on your cardiac medications before your next appointment, please call your pharmacy.   Testing/Procedures: Your physician has recommended that you have a sleep study. This test records several body functions during sleep, including: brain activity, eye movement, oxygen and carbon dioxide blood levels, heart rate and rhythm, breathing rate and rhythm, the flow of air through your mouth and nose, snoring, body muscle movements, and chest and belly movement.  Follow-Up: At [facility], you and your health needs are our priority.  As part of our continuing mission to provide you with exceptional heart care, we have created designated Provider Care Teams.  These Care Teams include your primary Cardiologist (physician) and Advanced Practice Providers (APPs -  Physician Assistants and Nurse Practitioners) who all work together to provide you with the care you need, when you need it. You will need a follow up appointment in 3 months. You may see Dr.Thomas Claiborne Billings or one of the following Advanced Practice Providers on your designated Care Team: Almyra Deforest, [place] . Fabian Sharp, PA-C

## 2018-10-14 ENCOUNTER — Encounter: Payer: Self-pay | Admitting: Cardiovascular Disease

## 2018-10-14 NOTE — Progress Notes (Signed)
Cardiology Office Note    Date:  10/14/2018   ID:  [name] 08-31-1960, MRN [id number]  PCP:  Patient, No Pcp Per  Cardiologist:  Shelva Majestic, MD (sleep); Dr. Gwenlyn Found  New sleep evaluation  History of Present Illness:  [name] is a 58 y.o. female who is followed by Dr. Gwenlyn Found for cardiology care.  She presents for sleep evaluation for sleep apnea.  [name] has a history of known CAD by cardiac catheterizations in 2010, 2013 and 2014 all of which showed 60% RCA stenoses..  She has had issues of hypertension, renal insufficiency, and renal duplex imaging did not reveal any renal artery stenosis but she has bilateral renal cysts.  She has a history of mixed hyperlipidemia.  In 2018, she was referred for a home sleep study which was done on July 21, 2016.  Mild sleep apnea was demonstrated with an AHI of 8.7; however, since this was a home study the severity of sleep apnea during REM sleep could not be assessed.  There was severe oxygen desaturation to a nadir of 74% and she had significant snoring  42.8% of the night.  She never pursued CPAP titration or further evaluation.  Presently, she admits to poor sleep.  She typically goes to bed at midnight and wakes up between 7 and 8 AM.  She snores.  She has frequent awakenings.  Her sleep is nonrestorative.  She admits to fatigue.  She recently saw Dr. Gwenlyn Found in July 2020 and she presents now for new sleep evaluation.   Past Medical History:  Diagnosis Date  . Angina   . CAD (coronary artery disease), non obstructive CAD in 2010    a. 2013 Cath: LAD 60, RCA 73m 70d; b. 2014 Cath: stable anatomy.  . [place]    a. 12/2014 HbA1c 6.5; b. Rx Glipizide - not taking.  . Family history of premature CAD   . Hx of adenomatous colonic polyps 06/11/2016  . Hyperlipidemia   . Hypertension   . Hypertensive heart disease   . Palpitations   . Reflux   . Tobacco abuse     Past Surgical  History:  Procedure Laterality Date  . APPENDECTOMY  1974  . CARDIAC CATHETERIZATION  02/07/2008   Recommendation-F/U stress test  . CARDIAC CATHETERIZATION  04/16/2011   Recommendation-Increase medical therapy trial  . CARDIOVASCULAR STRESS TEST  03/25/2011   Suspect subtle anterior septal ischemia  . FRACTURE SURGERY Left 1972   forearm fracture  . LEFT HEART CATHETERIZATION WITH CORONARY ANGIOGRAM N/A 04/16/2011   Procedure: LEFT HEART CATHETERIZATION WITH CORONARY ANGIOGRAM;  Surgeon: TTroy Sine MD;  [facility];  Service: Cardiovascular;  Laterality: N/A;  . LEFT HEART CATHETERIZATION WITH CORONARY ANGIOGRAM N/A 09/01/2012   Procedure: LEFT HEART CATHETERIZATION WITH CORONARY ANGIOGRAM;  Surgeon: KPixie Casino MD;  [facility];  Service: Cardiovascular;  Laterality: N/A;  . RENAL DOPPLER  06/05/2009   No evidence of significant diameter reduction  . TRANSTHORACIC ECHOCARDIOGRAM  01/25/2008   EF 60-65%, Moderate concentric LVH  . WRIST FRACTURE SURGERY Right ~ 1996    Current Medications: Outpatient Medications Prior to Visit  Medication Sig Dispense Refill  . albuterol (PROVENTIL HFA;VENTOLIN HFA) 108 (90 Base) MCG/ACT inhaler Inhale 2 puffs into the lungs every 4 (four) hours as needed. 1 Inhaler 1  . amLODipine (NORVASC) 10 MG tablet Take 1 tablet (10 mg total) by mouth daily. 90 tablet 3  .  aspirin 81 MG tablet Take 81 mg by mouth at bedtime.    . carvedilol (COREG) 25 MG tablet TAKE 1 TABLET (25 MG TOTAL) BY MOUTH 2 (TWO) TIMES DAILY WITH A MEAL. TO REPLACE METOPROLOL 180 tablet 1  . Cholecalciferol (VITAMIN D3 PO) Take 4,000 Units by mouth 2 (two) times daily.     . Evolocumab (REPATHA SURECLICK) 517 MG/ML SOAJ Inject 140 mg into the skin every 14 (fourteen) days. 2 pen 11  . hydrALAZINE (APRESOLINE) 100 MG tablet TAKE 1 TABLET BY MOUTH THREE TIMES A DAY 270 tablet 1  . isosorbide mononitrate (IMDUR) 60 MG 24 hr tablet Take 60 mg by mouth daily.    Marland Kitchen  lisinopril (PRINIVIL,ZESTRIL) 40 MG tablet Take 1 tablet (40 mg total) by mouth daily. 30 tablet 11  . omega-3 acid ethyl esters (LOVAZA) 1 g capsule Take 2 capsules (2 g total) by mouth 2 (two) times daily. 360 capsule 0   No facility-administered medications prior to visit.      Allergies:   Bupropion, Doxazosin mesylate, Rosuvastatin, Simvastatin, Wellbutrin [bupropion hcl], and Zetia [ezetimibe]   Social History   Socioeconomic History  . Marital status: Divorced    Spouse name: Not on file  . Number of children: 1  . Years of education: Not on file  . Highest education level: Not on file  Occupational History  . Occupation: Lobbyist: [employer]  . Financial resource strain: Somewhat hard  . Food insecurity    Worry: Never true    Inability: Never true  . Transportation needs    Medical: No    Non-medical: No  Tobacco Use  . Smoking status: Current Every Day Smoker    Packs/day: 0.25    Years: 30.00    Pack years: 7.50    Types: Cigarettes  . Smokeless tobacco: Never Used  . Tobacco comment: 2-  weeks  Substance and Sexual Activity  . Alcohol use: No    Alcohol/week: 0.0 standard drinks  . Drug use: No  . Sexual activity: Not Currently    Partners: Male  Lifestyle  . Physical activity    Days per week: 0 days    Minutes per session: 0 min  . Stress: To some extent  Relationships  . Social connections    Talks on phone: More than three times a week    Gets together: Once a week    Attends religious service: 1 to 4 times per year    Active member of club or organization: No    Attends meetings of clubs or organizations: Never    Relationship status: Divorced  Other Topics Concern  . Not on file  Social History Narrative  . Not on file    Additional social history is notable in that she is divorced since 33.  She has a 26 year old son.  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   Family History:  The patient's  family history includes Cancer in her maternal grandmother; Coronary artery disease in her brother; Diabetes in her mother; Heart attack in her maternal grandfather; Heart attack (age of onset: 5) in her maternal aunt; Hypertension in her father and maternal grandfather; Stroke in her father and maternal grandmother.   ROS General: Negative; No fevers, chills, or night sweats;  HEENT: Negative; No changes in vision or hearing, sinus congestion, difficulty swallowing Pulmonary: Negative; No cough, wheezing, shortness of breath, hemoptysis Cardiovascular: Negative; No chest pain, presyncope, syncope,  palpitations GI: Negative; No nausea, vomiting, diarrhea, or abdominal pain GU: Negative; No dysuria, hematuria, or difficulty voiding Musculoskeletal: Negative; no myalgias, joint pain, or weakness Hematologic/Oncology: Negative; no easy bruising, bleeding Endocrine: Negative; no heat/cold intolerance; no diabetes Neuro: Negative; no changes in balance, headaches Skin: Negative; No rashes or skin lesions Psychiatric: Negative; No behavioral problems, depression Sleep: Positive for untreated sleep apnea.  Significant snoring, daytime sleepiness, nonrestorative sleep.  No bruxism, restless legs, hypnogognic hallucinations, no cataplexy Other comprehensive 14 point system review is negative.   PHYSICAL EXAM:   VS:  BP (!) 160/84   Pulse 67   Temp (!) 97.1 F (36.2 C)   Ht 5' 4"  (1.626 m)   Wt 219 lb (99.3 kg)   LMP 12/14/2013   SpO2 97%   BMI 37.59 kg/m     Repeat blood pressure 140/84  Wt Readings from Last 3 Encounters:  10/12/18 219 lb (99.3 kg)  07/28/18 219 lb (99.3 kg)  02/02/18 225 lb 12.8 oz (102.4 kg)    General: Alert, oriented, no distress.  Moderate obesity Skin: normal turgor, no rashes, warm and dry HEENT: Normocephalic, atraumatic. Pupils equal round and reactive to light; sclera anicteric; extraocular muscles intact;  Nose without nasal septal hypertrophy  Mouth/Parynx benign; Mallinpatti scale 3 Neck: No JVD, no carotid bruits; normal carotid upstroke Lungs: clear to ausculatation and percussion; no wheezing or rales Chest wall: without tenderness to palpitation Heart: PMI not displaced, RRR, s1 s2 normal, 1/6 systolic murmur, no diastolic murmur, no rubs, gallops, thrills, or heaves Abdomen: soft, nontender; no hepatosplenomehaly, BS+; abdominal aorta nontender and not dilated by palpation. Back: no CVA tenderness Pulses 2+ Musculoskeletal: full range of motion, normal strength, no joint deformities Extremities: no clubbing cyanosis or edema, Homan's sign negative  Neurologic: grossly nonfocal; Cranial nerves grossly wnl Psychologic: Normal mood and affect   Studies/Labs Reviewed:   EKG:  EKG is not ordered today.    I personally reviewed the ECG from July 28, 2018 ECG (independently read by me): Normal sinus rhythm at 66 bpm.  T wave inversion in lead I, aVL and V6.  QTc interval 471  Recent Labs: BMP Latest Ref Rng & Units 10/24/2017 09/03/2017 09/01/2017  Glucose 65 - 99 mg/dL 99 159(H) 143(H)  BUN 6 - 24 mg/dL 17 32(H) 38(H)  Creatinine 0.57 - 1.00 mg/dL 1.59(H) 1.98(H) 2.00(H)  BUN/Creat Ratio 9 - 23 11 16 19   Sodium 134 - 144 mmol/L 140 136 136  Potassium 3.5 - 5.2 mmol/L 4.0 3.8 3.5  Chloride 96 - 106 mmol/L 102 98 102  CO2 20 - 29 mmol/L 22 22 25   Calcium 8.7 - 10.2 mg/dL 8.9 8.8 8.6(L)     Hepatic Function Latest Ref Rng & Units 10/24/2017 08/31/2017 08/23/2017  Total Protein 6.0 - 8.5 g/dL 6.6 6.4 6.9  Albumin 3.5 - 5.5 g/dL 3.9 3.6 4.0  AST 0 - 40 IU/L 15 40 29  ALT 0 - 32 IU/L 18 43(H) 37(H)  Alk Phosphatase 39 - 117 IU/L 53 41 45  Total Bilirubin 0.0 - 1.2 mg/dL <0.2 0.2 0.3  Bilirubin, Direct 0.1 - 0.5 mg/dL - - -    CBC Latest Ref Rng & Units 10/24/2017 08/31/2017 08/23/2017  WBC 3.4 - 10.8 x10E3/uL 8.7 14.7(A) 8.6  Hemoglobin 11.1 - 15.9 g/dL 13.0 14.1 13.9  Hematocrit 34.0 - 46.6 % 39.2 43.1 43.1  Platelets  150 - 450 x10E3/uL 251 - 264   Lab Results  Component Value Date  MCV 84 10/24/2017   MCV 86.2 08/31/2017   MCV 86 08/23/2017   Lab Results  Component Value Date   TSH 2.890 10/24/2017   Lab Results  Component Value Date   HGBA1C 6.3 (H) 08/23/2017     BNP No results found for: BNP  ProBNP    Component Value Date/Time   PROBNP 32.0 01/24/2008 1636     Lipid Panel     Component Value Date/Time   CHOL 183 10/05/2018 0923   TRIG 251 (H) 10/05/2018 0923   HDL 29 (L) 10/05/2018 0923   CHOLHDL 6.3 (H) 10/05/2018 0923   CHOLHDL 7.0 (H) 04/01/2016 0847   VLDL 52 (H) 04/01/2016 0847   LDLCALC 110 (H) 10/05/2018 0923   LABVLDL 44 (H) 10/05/2018 7564     RADIOLOGY: No results found.   Additional studies/ records that were reviewed today include:  I reviewed the patient's evaluations with Dr. Alvester Chou.  Her home sleep study was reviewed with her in detail.  ASSESSMENT:    1. OSA (obstructive sleep apnea)   2. Essential hypertension   3. Coronary artery disease involving native coronary artery of native heart without angina pectoris   4. Dyslipidemia- LDL goal < 70   5. Moderate obesity     PLAN:  Ms. Crist Fat Kirsh is a 58 year old female who has a history of moderate obesity with a BMI of 37.6, known CAD dating back to 2010, as well as a history of mild renal insufficiency and mixed hyperlipidemia.  She admits to loud snoring and in July 2018 a home sleep study verified overall mild sleep apnea.  However I suspect she probably had very significant sleep apnea during REM sleep which is unable to be assessed on the home study.  During that evaluation she had severe oxygen desaturation to a nadir of 74% which I suspect most likely occurred during REM sleep.  She also snored for approximately 43% of the night.  She has continued to have difficulty with sleep.  I had a long discussion with her today and reviewed her initial sleep study.  I also reviewed the adverse  consequences of untreated sleep apnea with reference to her cardiovascular health.  She has hypertension, known CAD, and I discussed with her potential risk of difficult control hypertension, nocturnal arrhythmias particularly with her marked oxygen desaturation nocturnally, as well as for potential nocturnal hypoxemia mediated ischemia.  I also discussed its ramifications on glucose control and GERD.  After much discussion, she agrees to pursue reevaluation.  Since it is over 2 years since her initial study I will schedule her for an in lab split-night protocol and if she meets criteria within the first 2 hours CPAP titration can be done at that time.  I discussed the importance of weight loss and increased exercise.  I discussed proper diet.  She continues to be on amlodipine 10 mg, carvedilol 25 mg twice a day, lisinopril 40 mg daily, in addition to isosorbide 60 mg daily.  Optimal blood pressure is is less than 120/80 with stage I hypertension at 130/80.  She will most likely require more optimal blood pressure control but hopefully with CPAP therapy her blood pressure will improve.  She is now on Repatha for hyperlipidemia continues to take Lovaza in light of her triglyceride elevation.  I will see her in 3 months for reevaluation  Medication Adjustments/Labs and Tests Ordered: Current medicines are reviewed at length with the patient today.  Concerns regarding medicines are outlined above.  Medication  changes, Labs and Tests ordered today are listed in the Patient Instructions below. Patient Instructions  Medication Instructions:  The current medical regimen is effective;  continue present plan and medications.  If you need a refill on your cardiac medications before your next appointment, please call your pharmacy.   Testing/Procedures: Your physician has recommended that you have a sleep study. This test records several body functions during sleep, including: brain activity, eye movement, oxygen  and carbon dioxide blood levels, heart rate and rhythm, breathing rate and rhythm, the flow of air through your mouth and nose, snoring, body muscle movements, and chest and belly movement.  Follow-Up: At [facility], you and your health needs are our priority.  As part of our continuing mission to provide you with exceptional heart care, we have created designated Provider Care Teams.  These Care Teams include your primary Cardiologist (physician) and Advanced Practice Providers (APPs -  Physician Assistants and Nurse Practitioners) who all work together to provide you with the care you need, when you need it. You will need a follow up appointment in 3 months. You may see Dr.Fadumo Heng Claiborne Billings or one of the following Advanced Practice Providers on your designated Care Team: Almyra Deforest, [place] . Fabian Sharp, PA-C        Signed, Shelva Majestic, MD  10/14/2018 4:02 PM    [facility] [address] [place], [place], [place]  [address] Phone: [phone]

## 2018-10-17 ENCOUNTER — Telehealth: Payer: Self-pay | Admitting: *Deleted

## 2018-10-17 NOTE — Telephone Encounter (Signed)
PA submitted to [facility] for sleep study.

## 2018-10-30 NOTE — Telephone Encounter (Signed)
Received notification that the PA was denied- will scan denial into chart, but wanted to make you aware.  Thank you~

## 2019-02-07 ENCOUNTER — Ambulatory Visit: Payer: Managed Care, Other (non HMO) | Admitting: Cardiovascular Disease

## 2019-02-23 ENCOUNTER — Encounter: Payer: Self-pay | Admitting: Cardiovascular Disease

## 2019-02-23 ENCOUNTER — Ambulatory Visit (INDEPENDENT_AMBULATORY_CARE_PROVIDER_SITE_OTHER): Payer: Managed Care, Other (non HMO) | Admitting: Cardiovascular Disease

## 2019-02-23 ENCOUNTER — Other Ambulatory Visit: Payer: Self-pay

## 2019-02-23 VITALS — BP 104/58 | Temp 97.0°F | Ht 66.0 in | Wt 217.6 lb

## 2019-02-23 DIAGNOSIS — I10 Essential (primary) hypertension: Secondary | ICD-10-CM

## 2019-02-23 DIAGNOSIS — I2510 Atherosclerotic heart disease of native coronary artery without angina pectoris: Secondary | ICD-10-CM | POA: Diagnosis not present

## 2019-02-23 DIAGNOSIS — E668 Other obesity: Secondary | ICD-10-CM

## 2019-02-23 DIAGNOSIS — G4733 Obstructive sleep apnea (adult) (pediatric): Secondary | ICD-10-CM

## 2019-02-23 DIAGNOSIS — E785 Hyperlipidemia, unspecified: Secondary | ICD-10-CM

## 2019-02-23 NOTE — Progress Notes (Signed)
Cardiology Office Note    Date:  02/25/2019   ID:  Madison, Walter [birth year], MRN [id number]  PCP:  Patient, No Pcp Per  Cardiologist:  Shelva Majestic, MD (sleep); Dr. Gwenlyn Found  F/U sleep evaluation  History of Present Illness:  [name] is a 59 y.o. female who is followed by Dr. Gwenlyn Found for cardiology care.  I saw her for initial sleep evaluation on 10/12/2018.  She presents for follow-up evaluation.  [name] has a history of known CAD by cardiac catheterizations in 2010, 2013 and 2014 all of which showed 60% RCA stenoses..  She has had issues of hypertension, renal insufficiency, and renal duplex imaging did not reveal any renal artery stenosis but she has bilateral renal cysts.  She has a history of mixed hyperlipidemia.  In 2018, she was referred for a home sleep study which was done on July 21, 2016.  Mild sleep apnea was demonstrated with an AHI of 8.7; however, since this was a home study the severity of sleep apnea during REM sleep could not be assessed.  There was severe oxygen desaturation to a nadir of 74% and she had significant snoring 42.8% of the night.  She never pursued CPAP titration or further evaluation.  Presently, she admits to poor sleep.  She typically goes to bed at midnight and wakes up between 7 and 8 AM.  She snores.  She has frequent awakenings.  Her sleep is nonrestorative.  She admits to fatigue.  She recently saw Dr. Gwenlyn Found in July 2020 and she presented for new sleep evaluation with me on 10/12/2018.  During that evaluation I had a long discussion with her and reviewed her sleep study, the adverse consequences of untreated sleep apnea with reference to her cardiovascular health and particularly with reference to control of her blood pressure, nocturnal arrhythmias blood sugar and GERD.  Since she was over 2 years since her sleep study ice recommended scheduling her for a split-night protocol.  I discussed the importance of weight loss and exercise.    Apparently, her split-night sleep study was denied by her insurance and as result has not had this done.  Presently she believes her symptoms are worse.  She continues to have significant snoring, frequent awakenings, frequent nocturia, and she has had significant blood pressure issues.  On her initial home study she had severe oxygen desaturation to a nadir of 74%.  She is followed by Dr. Noel Journey for renal issues and was recently placed on clonidine.  She had previously been was prescribed Repatha but is not taking this.  She is intolerant to statins.  Most recently she has been on amlodipine 10 mg, carvedilol 25 mg twice a day, hydralazine 100 mg 3 times a day, isosorbide 60 mg at bedtime.  She presents for evaluation.  Past Medical History:  Diagnosis Date  . Angina   . CAD (coronary artery disease), non obstructive CAD in 2010    a. 2013 Cath: LAD 60, RCA 48m 70d; b. 2014 Cath: stable anatomy.  . Diabetes mellitus without complication ([place]    a. 12/2014 HbA1c 6.5; b. Rx Glipizide - not taking.  . Family history of premature CAD   . Hx of adenomatous colonic polyps 06/11/2016  . Hyperlipidemia   . Hypertension   . Hypertensive heart disease   . Palpitations   . Reflux   . Tobacco abuse     Past Surgical History:  Procedure Laterality Date  . APPENDECTOMY  1974  .  CARDIAC CATHETERIZATION  02/07/2008   Recommendation-F/U stress test  . CARDIAC CATHETERIZATION  04/16/2011   Recommendation-Increase medical therapy trial  . CARDIOVASCULAR STRESS TEST  03/25/2011   Suspect subtle anterior septal ischemia  . FRACTURE SURGERY Left 1972   forearm fracture  . LEFT HEART CATHETERIZATION WITH CORONARY ANGIOGRAM N/A 04/16/2011   Procedure: LEFT HEART CATHETERIZATION WITH CORONARY ANGIOGRAM;  Surgeon: Troy Sine, MD;  Location[facility] C[facility] LAB;  Service: Cardiovascular;  Laterality: N/A;  . LEFT HEART CATHETERIZATION WITH CORONARY ANGIOGRAM N/A 09/01/2012   Procedure: LEFT HEART CATHETERIZATION  WITH CORONARY ANGIOGRAM;  Surgeon: Pixie Casino, MD;  Location: [facility] CATH LAB;  Service: Cardiovascular;  Laterality: N/A;  . RENAL DOPPLER  06/05/2009   No evidence of significant diameter reduction  . TRANSTHORACIC ECHOCARDIOGRAM  01/25/2008   EF 60-65%, Moderate concentric LVH  . WRIST FRACTURE SURGERY Right ~ 1996    Current Medications: Outpatient Medications Prior to Visit  Medication Sig Dispense Refill  . albuterol (PROVENTIL HFA;VENTOLIN HFA) 108 (90 Base) MCG/ACT inhaler Inhale 2 puffs into the lungs every 4 (four) hours as needed. 1 Inhaler 1  . amLODipine (NORVASC) 10 MG tablet Take 1 tablet (10 mg total) by mouth daily. 90 tablet 3  . aspirin 81 MG tablet Take 81 mg by mouth at bedtime.    . Calcium Carb-Cholecalciferol (CALCIUM PLUS VITAMIN D3) 600-500 MG-UNIT CAPS Take 2,000 Units by mouth once.    . calcium-vitamin D (OSCAL WITH D) 500-200 MG-UNIT tablet Take 1 tablet by mouth.    . carvedilol (COREG) 25 MG tablet TAKE 1 TABLET (25 MG TOTAL) BY MOUTH 2 (TWO) TIMES DAILY WITH A MEAL. TO REPLACE METOPROLOL 180 tablet 1  . Cholecalciferol (VITAMIN D3 PO) Take 4,000 Units by mouth 2 (two) times daily.     . cloNIDine (CATAPRES) 0.1 MG tablet Take 0.1 mg by mouth 2 (two) times daily.    . hydrALAZINE (APRESOLINE) 100 MG tablet TAKE 1 TABLET BY MOUTH THREE TIMES A DAY 270 tablet 1  . isosorbide mononitrate (IMDUR) 60 MG 24 hr tablet Take 60 mg by mouth daily.    Marland Kitchen omega-3 acid ethyl esters (LOVAZA) 1 g capsule Take 2 capsules (2 g total) by mouth 2 (two) times daily. 360 capsule 0  . Evolocumab (REPATHA SURECLICK) 258 MG/ML SOAJ Inject 140 mg into the skin every 14 (fourteen) days. (Patient not taking: Reported on 02/23/2019) 2 pen 11  . lisinopril (PRINIVIL,ZESTRIL) 40 MG tablet Take 1 tablet (40 mg total) by mouth daily. (Patient not taking: Reported on 02/23/2019) 30 tablet 11   No facility-administered medications prior to visit.     Allergies:   Bupropion, Doxazosin mesylate,  Rosuvastatin, Simvastatin, Wellbutrin [bupropion hcl], and Zetia [ezetimibe]   Social History   Socioeconomic History  . Marital status: Divorced    Spouse name: Not on file  . Number of children: 1  . Years of education: Not on file  . Highest education level: Not on file  Occupational History  . Occupation: Lobbyist: [employer]  Tobacco Use  . Smoking status: Current Every Day Smoker    Packs/day: 0.25    Years: 30.00    Pack years: 7.50    Types: Cigarettes  . Smokeless tobacco: Never Used  . Tobacco comment: 2-  weeks  Substance and Sexual Activity  . Alcohol use: No    Alcohol/week: 0.0 standard drinks  . Drug use: No  . Sexual activity: Not Currently  Partners: Male  Other Topics Concern  . Not on file  Social History Narrative  . Not on file   Social Determinants of Health   Financial Resource Strain:   . Difficulty of Paying Living Expenses: Not on file  Food Insecurity:   . Worried About Charity fundraiser in the Last Year: Not on file  . Ran Out of Food in the Last Year: Not on file  Transportation Needs:   . Lack of Transportation (Medical): Not on file  . Lack of Transportation (Non-Medical): Not on file  Physical Activity:   . Days of Exercise per Week: Not on file  . Minutes of Exercise per Session: Not on file  Stress:   . Feeling of Stress : Not on file  Social Connections:   . Frequency of Communication with Friends and Family: Not on file  . Frequency of Social Gatherings with Friends and Family: Not on file  . Attends Religious Services: Not on file  . Active Member of Clubs or Organizations: Not on file  . Attends Archivist Meetings: Not on file  . Marital Status: Not on file    Additional social history is notable in that she is divorced since 63.  She has a 44 year old son.  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   Family History:  The patient's family history includes Cancer in her maternal  grandmother; Coronary artery disease in her brother; Diabetes in her mother; Heart attack in her maternal grandfather; Heart attack (age of onset: 70) in her maternal aunt; Hypertension in her father and maternal grandfather; Stroke in her father and maternal grandmother.   ROS General: Negative; No fevers, chills, or night sweats;  HEENT: Negative; No changes in vision or hearing, sinus congestion, difficulty swallowing Pulmonary: Negative; No cough, wheezing, shortness of breath, hemoptysis Cardiovascular: Positive for hypertension GI: Negative; No nausea, vomiting, diarrhea, or abdominal pain GU: Negative; No dysuria, hematuria, or difficulty voiding Musculoskeletal: Negative; no myalgias, joint pain, or weakness Hematologic/Oncology: Negative; no easy bruising, bleeding Endocrine: Negative; no heat/cold intolerance; no diabetes Neuro: Negative; no changes in balance, headaches Skin: Negative; No rashes or skin lesions Psychiatric: Negative; No behavioral problems, depression Sleep: Positive for untreated sleep apnea.  Significant snoring, daytime sleepiness, nonrestorative sleep.  No bruxism, restless legs, hypnogognic hallucinations, no cataplexy Other comprehensive 14 point system review is negative.   PHYSICAL EXAM:   VS:  BP (!) 104/58   Temp (!) 97 F (36.1 C)   Ht '5\' 6"'$  (1.676 m)   Wt 217 lb 9.6 oz (98.7 kg)   LMP 12/14/2013   SpO2 94%   BMI 35.12 kg/m     Repeat blood pressure by me was 102/60  Wt Readings from Last 3 Encounters:  02/23/19 217 lb 9.6 oz (98.7 kg)  10/12/18 219 lb (99.3 kg)  07/28/18 219 lb (99.3 kg)    General: Alert, oriented, no distress.  Moderate obesity Skin: normal turgor, no rashes, warm and dry HEENT: Normocephalic, atraumatic. Pupils equal round and reactive to light; sclera anicteric; extraocular muscles intact;  Nose without nasal septal hypertrophy Mouth/Parynx benign; Mallinpatti scale 3/4 Neck: No JVD, no carotid bruits; normal  carotid upstroke Lungs: clear to ausculatation and percussion; no wheezing or rales Chest wall: without tenderness to palpitation Heart: PMI not displaced, RRR, s1 s2 normal, 1/6 systolic murmur, no diastolic murmur, no rubs, gallops, thrills, or heaves Abdomen: soft, nontender; no hepatosplenomehaly, BS+; abdominal aorta nontender and not dilated by palpation. Back:  no CVA tenderness Pulses 2+ Musculoskeletal: full range of motion, normal strength, no joint deformities Extremities: no clubbing cyanosis or edema, Homan's sign negative  Neurologic: grossly nonfocal; Cranial nerves grossly wnl Psychologic: Normal mood and affect   Studies/Labs Reviewed:   ECG (independently read by me): Sinus bradycardia 59 bpm.  Lateral ST T changes.  Prolonged QTc interval at 485 msec.   I personally reviewed the ECG from July 28, 2018 ECG (independently read by me): Normal sinus rhythm at 66 bpm.  T wave inversion in lead I, aVL and V6.  QTc interval 471  Recent Labs: BMP Latest Ref Rng & Units 10/24/2017 09/03/2017 09/01/2017  Glucose 65 - 99 mg/dL 99 159(H) 143(H)  BUN 6 - 24 mg/dL 17 32(H) 38(H)  Creatinine 0.57 - 1.00 mg/dL 1.59(H) 1.98(H) 2.00(H)  BUN/Creat Ratio 9 - 23 11 16 19   Sodium 134 - 144 mmol/L 140 136 136  Potassium 3.5 - 5.2 mmol/L 4.0 3.8 3.5  Chloride 96 - 106 mmol/L 102 98 102  CO2 20 - 29 mmol/L 22 22 25   Calcium 8.7 - 10.2 mg/dL 8.9 8.8 8.6(L)     Hepatic Function Latest Ref Rng & Units 10/24/2017 08/31/2017 08/23/2017  Total Protein 6.0 - 8.5 g/dL 6.6 6.4 6.9  Albumin 3.5 - 5.5 g/dL 3.9 3.6 4.0  AST 0 - 40 IU/L 15 40 29  ALT 0 - 32 IU/L 18 43(H) 37(H)  Alk Phosphatase 39 - 117 IU/L 53 41 45  Total Bilirubin 0.0 - 1.2 mg/dL <0.2 0.2 0.3  Bilirubin, Direct 0.1 - 0.5 mg/dL - - -    CBC Latest Ref Rng & Units 10/24/2017 08/31/2017 08/23/2017  WBC 3.4 - 10.8 x10E3/uL 8.7 14.7(A) 8.6  Hemoglobin 11.1 - 15.9 g/dL 13.0 14.1 13.9  Hematocrit 34.0 - 46.6 % 39.2 43.1 43.1    Platelets 150 - 450 x10E3/uL 251 - 264   Lab Results  Component Value Date   MCV 84 10/24/2017   MCV 86.2 08/31/2017   MCV 86 08/23/2017   Lab Results  Component Value Date   TSH 2.890 10/24/2017   Lab Results  Component Value Date   HGBA1C 6.3 (H) 08/23/2017     BNP No results found for: BNP  ProBNP    Component Value Date/Time   PROBNP 32.0 01/24/2008 1636     Lipid Panel     Component Value Date/Time   CHOL 183 10/05/2018 0923   TRIG 251 (H) 10/05/2018 0923   HDL 29 (L) 10/05/2018 0923   CHOLHDL 6.3 (H) 10/05/2018 0923   CHOLHDL 7.0 (H) 04/01/2016 0847   VLDL 52 (H) 04/01/2016 0847   LDLCALC 110 (H) 10/05/2018 0923   LABVLDL 44 (H) 10/05/2018 2355     RADIOLOGY: No results found.   Additional studies/ records that were reviewed today include:  I reviewed the patient's evaluations with Dr. Gwenlyn Found.  Her home sleep study was again  reviewed with her in detail.  ASSESSMENT:    1. OSA (obstructive sleep apnea)   2. Coronary artery disease involving native coronary artery of native heart without angina pectoris   3. Essential hypertension   4. Moderate obesity   5. Hyperlipidemia with target LDL less than 70     PLAN:  Ms. Kath is a 59 year old female who has a history of moderate obesity,  known CAD dating back to 2010, as well as a history of mild renal insufficiency and mixed hyperlipidemia.  She admits to loud snoring and in July 2018  a home sleep study  overall mild sleep apnea.  However I suspect she probably had very significant sleep apnea during REM sleep which is unable to be assessed on the home study.  During that evaluation she had severe oxygen desaturation to a nadir of 74% which I suspect most likely occurred in the setting of severe limb related sleep apnea.  On that evaluation she snored for almost 50% of the night.  She has had significant cardiovascular health issues with difficulty with blood pressure control and as is well-documented  this can be correlated to the severity of sleep apnea during REM sleep.  I had scheduled her for a reassessment of her sleep apnea with a split-night protocol and in lab CPAP titration.  Unfortunately this was initially denied by her insurance company.  Over the last several months, her symptoms have progressively become worse.  I do believe an in lab CPAP titration is warranted rather than just AutoPap therapy.  I will request a resubmission particularly with her worsening symptomatology and the importance of REM sleep evaluation.  She was recently started on clonidine by her nephrologist which may be contributing to her blood pressure being low today.  She continues to be on amlodipine 10 mg, carvedilol 25 mg twice a day, hydralazine 100 mg 3 times a day in addition to nocturnal isosorbide.  Also, her insurance apparently stopped paying for her Repatha which will need to be reapplied.  I will see her in 3 to 4 months for reevaluation   Medication Adjustments/Labs and Tests Ordered: Current medicines are reviewed at length with the patient today.  Concerns regarding medicines are outlined above.  Medication changes, Labs and Tests ordered today are listed in the Patient Instructions below. Patient Instructions  Medication Instructions:  NO CHANGES *If you need a refill on your cardiac medications before your next appointment, please call your pharmacy*   Testing/Procedures: Your physician has recommended that you have a sleep study. This test records several body functions during sleep, including: brain activity, eye movement, oxygen and carbon dioxide blood levels, heart rate and rhythm, breathing rate and rhythm, the flow of air through your mouth and nose, snoring, body muscle movements, and chest and belly movement. TO BE SCHEDULED BY SLEEP COORDINATOR  Follow-Up: At [facility], you and your health needs are our priority.  As part of our continuing mission to provide you with exceptional  heart care, we have created designated Provider Care Teams.  These Care Teams include your primary Cardiologist (physician) and Advanced Practice Providers (APPs -  Physician Assistants and Nurse Practitioners) who all work together to provide you with the care you need, when you need it.  Your next appointment:   3-4 month(s)  The format for your next appointment:   In Person  Provider:   Shelva Majestic, MD         Signed, Shelva Majestic, MD  02/25/2019 11:43 AM    [facility] [address], [place], [place],   [address] Phone: [phone]

## 2019-02-23 NOTE — Patient Instructions (Signed)
Medication Instructions:  NO CHANGES *If you need a refill on your cardiac medications before your next appointment, please call your pharmacy*   Testing/Procedures: Your physician has recommended that you have a sleep study. This test records several body functions during sleep, including: brain activity, eye movement, oxygen and carbon dioxide blood levels, heart rate and rhythm, breathing rate and rhythm, the flow of air through your mouth and nose, snoring, body muscle movements, and chest and belly movement. TO BE SCHEDULED BY SLEEP COORDINATOR  Follow-Up: At [facility], you and your health needs are our priority.  As part of our continuing mission to provide you with exceptional heart care, we have created designated Provider Care Teams.  These Care Teams include your primary Cardiologist (physician) and Advanced Practice Providers (APPs -  Physician Assistants and Nurse Practitioners) who all work together to provide you with the care you need, when you need it.  Your next appointment:   3-4 month(s)  The format for your next appointment:   In Person  Provider:   Shelva Majestic, MD

## 2019-03-05 ENCOUNTER — Telehealth: Payer: Self-pay | Admitting: *Deleted

## 2019-03-05 ENCOUNTER — Other Ambulatory Visit: Payer: Self-pay | Admitting: Cardiovascular Disease

## 2019-03-05 DIAGNOSIS — G4733 Obstructive sleep apnea (adult) (pediatric): Secondary | ICD-10-CM

## 2019-03-05 DIAGNOSIS — I2510 Atherosclerotic heart disease of native coronary artery without angina pectoris: Secondary | ICD-10-CM

## 2019-03-05 DIAGNOSIS — I10 Essential (primary) hypertension: Secondary | ICD-10-CM

## 2019-03-05 NOTE — Telephone Encounter (Signed)
Patient notified  Christella Scheuermann denied in lab sleep study. Approved HST. Appointment scheduled 04/27/19@ 11:00. Auth # [id number]. Valid dates 03/05/19 to 06/03/19.

## 2019-04-19 ENCOUNTER — Other Ambulatory Visit: Payer: Self-pay | Admitting: Cardiovascular Disease

## 2019-04-27 ENCOUNTER — Other Ambulatory Visit: Payer: Self-pay

## 2019-04-27 ENCOUNTER — Ambulatory Visit (HOSPITAL_BASED_OUTPATIENT_CLINIC_OR_DEPARTMENT_OTHER): Payer: Managed Care, Other (non HMO) | Attending: Cardiovascular Disease | Admitting: Cardiovascular Disease

## 2019-04-27 DIAGNOSIS — G4733 Obstructive sleep apnea (adult) (pediatric): Secondary | ICD-10-CM | POA: Diagnosis present

## 2019-04-27 DIAGNOSIS — G4734 Idiopathic sleep related nonobstructive alveolar hypoventilation: Secondary | ICD-10-CM | POA: Diagnosis not present

## 2019-04-27 DIAGNOSIS — I2510 Atherosclerotic heart disease of native coronary artery without angina pectoris: Secondary | ICD-10-CM | POA: Diagnosis not present

## 2019-04-27 DIAGNOSIS — I10 Essential (primary) hypertension: Secondary | ICD-10-CM | POA: Insufficient documentation

## 2019-05-10 NOTE — Procedures (Signed)
    Patient Name: [name], [name] Date: 04/29/2019 Gender: Female D.O.B: 03/16/1960 Age (years): 80 Referring Provider: Shelva Majestic MD, ABSM Height (inches): 41 Interpreting Physician: Shelva Majestic MD, ABSM Weight (lbs): 220 RPSGT: Jacolyn Reedy BMI: 38 MRN: [id number] Neck Size: 15.50  CLINICAL INFORMATION Sleep Study Type: HST  Indication for sleep study: N/A  Epworth Sleepiness Score: 11  SLEEP STUDY TECHNIQUE A multi-channel overnight portable sleep study was performed. The channels recorded were: nasal airflow, thoracic respiratory movement, and oxygen saturation with a pulse oximetry. Snoring was also monitored.  MEDICATIONS albuterol (PROVENTIL HFA;VENTOLIN HFA) 108 (90 Base) MCG/ACT inhaler  amLODipine (NORVASC) 10 MG tablet  aspirin 81 MG tablet  Calcium Carb-Cholecalciferol (CALCIUM PLUS VITAMIN D3) 600-500 MG-UNIT CAPS  calcium-vitamin D (OSCAL WITH D) 500-200 MG-UNIT tablet  carvedilol (COREG) 25 MG tablet  Cholecalciferol (VITAMIN D3 PO)  cloNIDine (CATAPRES) 0.1 MG tablet  Evolocumab (REPATHA SURECLICK) 237 MG/ML SOAJ  hydrALAZINE (APRESOLINE) 100 MG tablet  isosorbide mononitrate (IMDUR) 60 MG 24 hr tablet  lisinopril (PRINIVIL,ZESTRIL) 40 MG tablet  omega-3 acid ethyl esters (LOVAZA) 1 g capsule  Patient self administered medications include: N/A.  SLEEP ARCHITECTURE Patient was studied for 377.5 minutes. The sleep efficiency was 95.3 % and the patient was supine for 0%. The arousal index was 0.0 per hour.  RESPIRATORY PARAMETERS The overall AHI was 12.4 per hour, with a central apnea index of 0.0 per hour. The severity of sleep apnea during REM sleep cannot be assessed on this home study.  The oxygen nadir was 73% during sleep. Time spent < 89% was 317.4 minutes.  CARDIAC DATA Mean heart rate during sleep was 68.3 bpm.  IMPRESSIONS - Mild obstructive sleep apnea overall occurred during this study (AHI 12.4/h); however the severity  of sleep apnea during REM sleep sleep cannot be assessed. - No significant central sleep apnea occurred during this study (CAI = 0.0/h). - Severe oxygen desaturation to a nadir of 73% suggesting  more severe sleep apnea during REM sleep. - Patient snored 49.6% during the sleep.  DIAGNOSIS - Obstructive Sleep Apnea (327.23 [G47.33 ICD-10]) - Nocturnal Hypoxemia (327.26 [G47.36 ICD-10])  RECOMMENDATIONS - In this patient with cardiovascular comorbidities and severe nocturnal hypoxia recommend an in-lab therapeutic CPAP titration to determine optimal pressure required to alleviate sleep disordered breathing. - Effort should be made to optimize nasal and oropharyngeal patency. - Avoid alcohol, sedatives and other CNS depressants that may worsen sleep apnea and disrupt normal sleep architecture. - Sleep hygiene should be reviewed to assess factors that may improve sleep quality. - Weight management (BMI 38) and regular exercise should be initiated or continued. - Recommend a dowload after CPAP instittution and sleep clinic evaluation.   [Electronically signed] 05/10/2019 07:15 PM  Shelva Majestic MD, [facility], [place], American Board of Sleep Medicine   NPI: [id number] [place] PH: [phone]   FX: [phone] [place]

## 2019-06-08 ENCOUNTER — Ambulatory Visit: Payer: Managed Care, Other (non HMO) | Admitting: Cardiovascular Disease

## 2019-06-19 ENCOUNTER — Telehealth: Payer: Self-pay | Admitting: *Deleted

## 2019-06-19 NOTE — Telephone Encounter (Signed)
Patient notified of sleep study results and recommendations. She has agreed to proceed with CPAP titration. PA will be submitted to [facility].

## 2019-06-21 ENCOUNTER — Ambulatory Visit: Payer: Managed Care, Other (non HMO) | Admitting: *Deleted

## 2019-06-21 VITALS — Ht 64.0 in | Wt 215.0 lb

## 2019-06-21 DIAGNOSIS — Z86010 Personal history of colonic polyps: Secondary | ICD-10-CM

## 2019-06-21 DIAGNOSIS — Z01818 Encounter for other preprocedural examination: Secondary | ICD-10-CM

## 2019-06-21 NOTE — Progress Notes (Signed)

## 2019-06-28 ENCOUNTER — Encounter: Payer: Self-pay | Admitting: Internal Medicine

## 2019-06-29 ENCOUNTER — Telehealth: Payer: Self-pay | Admitting: *Deleted

## 2019-06-29 NOTE — Telephone Encounter (Signed)
-----   Message from Lauralee Evener, [place] sent at 06/19/2019  3:33 PM EDT ----- Cpap titration

## 2019-07-04 ENCOUNTER — Ambulatory Visit (INDEPENDENT_AMBULATORY_CARE_PROVIDER_SITE_OTHER): Payer: Managed Care, Other (non HMO)

## 2019-07-04 ENCOUNTER — Other Ambulatory Visit: Payer: Self-pay | Admitting: Internal Medicine

## 2019-07-04 DIAGNOSIS — Z1159 Encounter for screening for other viral diseases: Secondary | ICD-10-CM

## 2019-07-05 LAB — SARS CORONAVIRUS 2 (TAT 6-24 HRS): SARS Coronavirus 2: NEGATIVE

## 2019-07-06 ENCOUNTER — Other Ambulatory Visit: Payer: Self-pay

## 2019-07-06 ENCOUNTER — Encounter: Payer: Self-pay | Admitting: Internal Medicine

## 2019-07-06 ENCOUNTER — Ambulatory Visit (AMBULATORY_SURGERY_CENTER): Payer: Managed Care, Other (non HMO) | Admitting: Internal Medicine

## 2019-07-06 VITALS — BP 160/82 | HR 64 | Temp 97.3°F | Resp 17 | Ht 64.0 in | Wt 215.0 lb

## 2019-07-06 DIAGNOSIS — Z86010 Personal history of colonic polyps: Secondary | ICD-10-CM

## 2019-07-06 MED ORDER — SODIUM CHLORIDE 0.9 % IV SOLN: 500.0000 mL | INTRAVENOUS | Status: DC

## 2019-07-06 NOTE — Op Note (Signed)
[facility] Patient Name: [name] Procedure Date: 07/06/2019 11:33 AM MRN: [id number] Endoscopist: Gatha Mayer , MD Age: 59 Referring MD:  Date of Birth: 1960/11/02 Gender: Female Account #: [id number] Procedure:                Colonoscopy Indications:              High risk colon cancer surveillance: Personal                            history of colonic polyps, Last colonoscopy: 2017 Medicines:                Propofol per Anesthesia, Monitored Anesthesia Care Procedure:                Pre-Anesthesia Assessment:                           - Prior to the procedure, a History and Physical                            was performed, and patient medications and                            allergies were reviewed. The patient's tolerance of                            previous anesthesia was also reviewed. The risks                            and benefits of the procedure and the sedation                            options and risks were discussed with the patient.                            All questions were answered, and informed consent                            was obtained. Prior Anticoagulants: The patient has                            taken no previous anticoagulant or antiplatelet                            agents. ASA Grade Assessment: III - A patient with                            severe systemic disease. After reviewing the risks                            and benefits, the patient was deemed in                            satisfactory condition to undergo the procedure.  After obtaining informed consent, the colonoscope                            was passed under direct vision. Throughout the                            procedure, the patient's blood pressure, pulse, and                            oxygen saturations were monitored continuously. The                            Colonoscope was introduced through the anus and                             advanced to the the cecum, identified by                            appendiceal orifice and ileocecal valve. The                            quality of the bowel preparation was good. The                            colonoscopy was performed without difficulty. The                            patient tolerated the procedure well. The bowel                            preparation used was Miralax via split dose                            instruction. Scope In: 11:41:50 AM Scope Out: 11:53:46 AM Scope Withdrawal Time: 0 hours 8 minutes 36 seconds  Total Procedure Duration: 0 hours 11 minutes 56 seconds  Findings:                 The perianal and digital rectal examinations were                            normal.                           Multiple diverticula were found in the sigmoid                            colon.                           The exam was otherwise without abnormality on                            direct and retroflexion views. Complications:            No immediate complications. Estimated blood loss:  None. Estimated Blood Loss:     Estimated blood loss: none. Impression:               - Diverticulosis in the sigmoid colon.                           - The examination was otherwise normal on direct                            and retroflexion views.                           - No specimens collected.                           - Personal history of colonic polyps. 2 adenomas                            max 15 mnm. Recommendation:           - Repeat colonoscopy in 5 years for surveillance.                           - Patient has a contact number available for                            emergencies. The signs and symptoms of potential                            delayed complications were discussed with the                            patient. Return to normal activities tomorrow.                            Written discharge instructions were  provided to the                            patient.                           - Resume previous diet.                           - Continue present medications. Gatha Mayer, MD 07/06/2019 12:02:50 PM This report has been signed electronically.

## 2019-07-06 NOTE — Progress Notes (Signed)
PT taken to PACU. Monitors in place. VSS. Report given to RN. 

## 2019-07-06 NOTE — Progress Notes (Signed)
Pt's states no medical or surgical changes since previsit or office visit. 

## 2019-07-06 NOTE — Patient Instructions (Addendum)
No polyps this time so will look again in 5 years.  I appreciate the opportunity to care for you. Gatha Mayer, MD, FACG  YOU HAD AN ENDOSCOPIC PROCEDURE TODAY AT [facility]:   Refer to the procedure report that was given to you for any specific questions about what was found during the examination.  If the procedure report does not answer your questions, please call your gastroenterologist to clarify.  If you requested that your care partner not be given the details of your procedure findings, then the procedure report has been included in a sealed envelope for you to review at your convenience later.  YOU SHOULD EXPECT: Some feelings of bloating in the abdomen. Passage of more gas than usual.  Walking can help get rid of the air that was put into your GI tract during the procedure and reduce the bloating. If you had a lower endoscopy (such as a colonoscopy or flexible sigmoidoscopy) you may notice spotting of blood in your stool or on the toilet paper. If you underwent a bowel prep for your procedure, you may not have a normal bowel movement for a few days.  Please Note:  You might notice some irritation and congestion in your nose or some drainage.  This is from the oxygen used during your procedure.  There is no need for concern and it should clear up in a day or so.  SYMPTOMS TO REPORT IMMEDIATELY:   Following lower endoscopy (colonoscopy or flexible sigmoidoscopy):  Excessive amounts of blood in the stool  Significant tenderness or worsening of abdominal pains  Swelling of the abdomen that is new, acute  Fever of 100F or higher  For urgent or emergent issues, a gastroenterologist can be reached at any hour by calling [phone]. Do not use MyChart messaging for urgent concerns.    DIET:  We do recommend a small meal at first, but then you may proceed to your regular diet.  Drink plenty of fluids but you should avoid alcoholic beverages for 24  hours.  ACTIVITY:  You should plan to take it easy for the rest of today and you should NOT DRIVE or use heavy machinery until tomorrow (because of the sedation medicines used during the test).    FOLLOW UP: Our staff will call the number listed on your records 48-72 hours following your procedure to check on you and address any questions or concerns that you may have regarding the information given to you following your procedure. If we do not reach you, we will leave a message.  We will attempt to reach you two times.  During this call, we will ask if you have developed any symptoms of COVID 19. If you develop any symptoms (ie: fever, flu-like symptoms, shortness of breath, cough etc.) before then, please call [phone].  If you test positive for Covid 19 in the 2 weeks post procedure, please call and report this information to [place].    SIGNATURES/CONFIDENTIALITY: You and/or your care partner have signed paperwork which will be entered into your electronic medical record.  These signatures attest to the fact that that the information above on your After Visit Summary has been reviewed and is understood.  Full responsibility of the confidentiality of this discharge information lies with you and/or your care-partner.  Please read over handouts about diverticulosis and high fiber diets  Continue your normal medications  Next colonoscopy- 5 years

## 2019-07-10 ENCOUNTER — Telehealth: Payer: Self-pay

## 2019-07-10 NOTE — Telephone Encounter (Signed)
  Follow up Call-  Call back number 07/06/2019  Post procedure Call Back phone  # [phone]  Permission to leave phone message Yes  Some recent data might be hidden     Patient questions:  Do you have a fever, pain , or abdominal swelling? No. Pain Score  0 *  Have you tolerated food without any problems? Yes.    Have you been able to return to your normal activities? Yes.    Do you have any questions about your discharge instructions: Diet   No. Medications  No. Follow up visit  No.  Do you have questions or concerns about your Care? No.  Actions: * If pain score is 4 or above: No action needed, pain <4.  1. Have you developed a fever since your procedure? no  2.   Have you had an respiratory symptoms (SOB or cough) since your procedure? no  3.   Have you tested positive for COVID 19 since your procedure no  4.   Have you had any family members/close contacts diagnosed with the COVID 19 since your procedure?  no   If yes to any of these questions please route to Joylene John, RN and Erenest Rasher, RN

## 2019-09-12 ENCOUNTER — Other Ambulatory Visit: Payer: Self-pay

## 2019-09-12 ENCOUNTER — Ambulatory Visit (INDEPENDENT_AMBULATORY_CARE_PROVIDER_SITE_OTHER): Payer: Managed Care, Other (non HMO) | Admitting: Cardiovascular Disease

## 2019-09-12 ENCOUNTER — Encounter: Payer: Self-pay | Admitting: Cardiovascular Disease

## 2019-09-12 VITALS — BP 216/92 | HR 68 | Ht 64.0 in | Wt 219.4 lb

## 2019-09-12 DIAGNOSIS — E668 Other obesity: Secondary | ICD-10-CM

## 2019-09-12 DIAGNOSIS — N184 Chronic kidney disease, stage 4 (severe): Secondary | ICD-10-CM | POA: Diagnosis not present

## 2019-09-12 DIAGNOSIS — I2510 Atherosclerotic heart disease of native coronary artery without angina pectoris: Secondary | ICD-10-CM | POA: Diagnosis not present

## 2019-09-12 DIAGNOSIS — G4733 Obstructive sleep apnea (adult) (pediatric): Secondary | ICD-10-CM

## 2019-09-12 DIAGNOSIS — G4734 Idiopathic sleep related nonobstructive alveolar hypoventilation: Secondary | ICD-10-CM | POA: Diagnosis not present

## 2019-09-12 DIAGNOSIS — I10 Essential (primary) hypertension: Secondary | ICD-10-CM

## 2019-09-12 DIAGNOSIS — E785 Hyperlipidemia, unspecified: Secondary | ICD-10-CM

## 2019-09-12 MED ORDER — CLONIDINE HCL 0.2 MG PO TABS: 0.2000 mg | ORAL_TABLET | Freq: Two times a day (BID) | ORAL | 3 refills | Status: DC

## 2019-09-12 NOTE — Progress Notes (Signed)
Cardiology Office Note    Date:  09/13/2019   ID:  Jayna, Mulnix Sep 06, 1960, MRN [id number]  PCP:  Patient, No Pcp Per  Cardiologist:  Shelva Majestic, MD (sleep); Dr. Gwenlyn Found  F/U sleep evaluation  History of Present Illness:  [name] is a 59 y.o. female who is followed by Dr. Gwenlyn Found for cardiology care.  I saw her for initial sleep evaluation on 10/12/2018 and last saw her on 02/23/2019.  She presents for follow-up evaluation.  Ms. Ace has a history of known CAD by cardiac catheterizations in 2010, 2013 and 2014 all of which showed 60% RCA stenoses..  She has had issues of hypertension, renal insufficiency, and renal duplex imaging did not reveal any renal artery stenosis but she has bilateral renal cysts.  She has a history of mixed hyperlipidemia.  In 2018, she was referred for a home sleep study which was done on July 21, 2016.  Mild sleep apnea was demonstrated with an AHI of 8.7; however, since this was a home study the severity of sleep apnea during REM sleep could not be assessed.  There was severe oxygen desaturation to a nadir of 74% and she had significant snoring 42.8% of the night.  She never pursued CPAP titration or further evaluation.  When I initially saw her, she admitted to very poor sleep.  She typically goes to bed at midnight and wakes up between 7 and 8 AM.  She snores.  She has frequent awakenings.  Her sleep is nonrestorative.  She admits to fatigue.  She saw Dr. Gwenlyn Found in July 2020 and she presented for new sleep evaluation with me on 10/12/2018.  During that evaluation I had a long discussion with her and reviewed her sleep study, the adverse consequences of untreated sleep apnea with reference to her cardiovascular health and particularly with reference to control of her blood pressure, nocturnal arrhythmias blood sugar and GERD.  Since she was over 2 years since her sleep study ice recommended scheduling her for a split-night protocol.  I discussed the  importance of weight loss and exercise.   I last saw her on February 23, 2019.  Apparently, her split-night sleep study was denied by her insurance and as result has not had this done.  At that time she felt her symptoms were worse   She continues to have significant snoring, frequent awakenings, frequent nocturia, and she has had significant blood pressure issues.  On her initial home study she had severe oxygen desaturation to a nadir of 74%.  She is followed by Dr. Noel Journey for renal issues and was recently placed on clonidine.  She had previously been was prescribed Repatha but is not taking this.  She is intolerant to statins.  Most recently she has been on amlodipine 10 mg, carvedilol 25 mg twice a day, hydralazine 100 mg 3 times a day, isosorbide 60 mg at bedtime.  Apparently we retry to schedule her for [place] split-night study with CPAP titration but apparently this was denied.  A home study was apparently done on April 29, 2019 which showed mild overall sleep apnea with an AHI of 12.4 and there was severe oxygen desaturation to a nadir of 73% suggesting more severe sleep apnea during REM sleep which could not be identified on this home study.  The recommended CPAP study was denied by insurance.  She has not yet been set up for AutoPap.  She has had issues with difficult to control blood pressure which may  very well be contributed by her significant probable rem related sleep apnea.  Most recently she is on amlodipine 10 mg, carvedilol 25 mg twice a day, hydralazine 100 mg 3 times a day isosorbide 60 mg daily and was recently started on clonidine 0.1 mg twice a day.  She sees Dr. Carolin Sicks for nephrology.  She presents for evaluation.  Past Medical History:  Diagnosis Date   Angina    CAD (coronary artery disease), non obstructive CAD in 2010    a. 2013 Cath: LAD 60, RCA 21m 70d; b. 2014 Cath: stable anatomy.   Chronic kidney disease    polycystic kidney disease , elevated creatine   COPD (chronic  obstructive pulmonary disease) ([place]    Diabetes mellitus without complication ([place]    a. 12/2014 HbA1c 6.5; b. Rx Glipizide - not taking.   Family history of premature CAD    Hx of adenomatous colonic polyps 06/11/2016   Hyperlipidemia    Hypertension    Hypertensive heart disease    Palpitations    Reflux    Sleep apnea    mild, no c-pap at this point   Tobacco abuse     Past Surgical History:  Procedure Laterality Date   [place] 02/07/2008   Recommendation-F/U stress test   CARDIAC CATHETERIZATION  04/16/2011   Recommendation-Increase medical therapy trial   CARDIOVASCULAR STRESS TEST  03/25/2011   Suspect subtle anterior septal ischemia   COLONOSCOPY     FRACTURE SURGERY Left 1972   forearm fracture   LEFT HEART CATHETERIZATION WITH CORONARY ANGIOGRAM N/A 04/16/2011   Procedure: LEFT HEART CATHETERIZATION WITH CORONARY ANGIOGRAM;  Surgeon: TTroy Sine MD;  [facility];  Service: Cardiovascular;  Laterality: N/A;   LEFT HEART CATHETERIZATION WITH CORONARY ANGIOGRAM N/A 09/01/2012   Procedure: LEFT HEART CATHETERIZATION WITH CORONARY ANGIOGRAM;  Surgeon: KPixie Casino MD;  [facility];  Service: Cardiovascular;  Laterality: N/A;   POLYPECTOMY     RENAL DOPPLER  06/05/2009   No evidence of significant diameter reduction   TRANSTHORACIC ECHOCARDIOGRAM  01/25/2008   EF 60-65%, Moderate concentric LVH   WRIST FRACTURE SURGERY Right ~ 1996    Current Medications: Outpatient Medications Prior to Visit  Medication Sig Dispense Refill   albuterol (PROVENTIL HFA;VENTOLIN HFA) 108 (90 Base) MCG/ACT inhaler Inhale 2 puffs into the lungs every 4 (four) hours as needed. 1 Inhaler 1   amLODipine (NORVASC) 10 MG tablet Take 1 tablet (10 mg total) by mouth daily. 90 tablet 3   aspirin 81 MG tablet Take 81 mg by mouth at bedtime.     carvedilol (COREG) 25 MG tablet TAKE 1 TABLET (25 MG TOTAL) BY MOUTH 2  (TWO) TIMES DAILY WITH A MEAL. TO REPLACE METOPROLOL 180 tablet 1   hydrALAZINE (APRESOLINE) 100 MG tablet TAKE 1 TABLET BY MOUTH THREE TIMES A DAY 270 tablet 1   isosorbide mononitrate (IMDUR) 60 MG 24 hr tablet Take 60 mg by mouth daily.     Oyster Shell Calcium 500 MG TABS Take 1 tablet by mouth 2 (two) times daily.     Calcium Carb-Cholecalciferol (CALCIUM PLUS VITAMIN D3) 600-500 MG-UNIT CAPS Take 2,000 Units by mouth once.      calcium-vitamin D (OSCAL WITH D) 500-200 MG-UNIT tablet Take 1 tablet by mouth.      Cholecalciferol (VITAMIN D3 PO) Take 4,000 Units by mouth 2 (two) times daily.      cloNIDine (CATAPRES) 0.1 MG  tablet Take 0.1 mg by mouth 2 (two) times daily.     Evolocumab (REPATHA SURECLICK) 062 MG/ML SOAJ Inject 140 mg into the skin every 14 (fourteen) days. 2 pen 11   omega-3 acid ethyl esters (LOVAZA) 1 g capsule Take 2 capsules (2 g total) by mouth 2 (two) times daily. 360 capsule 0   No facility-administered medications prior to visit.     Allergies:   Bupropion, Doxazosin mesylate, Rosuvastatin, Simvastatin, Wellbutrin [bupropion hcl], and Zetia [ezetimibe]   Social History   Socioeconomic History   Marital status: Divorced    Spouse name: Not on file   Number of children: 1   Years of education: Not on file   Highest education level: Not on file  Occupational History   Occupation: Lobbyist: [employer]  Tobacco Use   Smoking status: Current Every Day Smoker    Packs/day: 0.25    Years: 30.00    Pack years: 7.50    Types: Cigarettes   Smokeless tobacco: Never Used   Tobacco comment: 2-  weeks  Vaping Use   Vaping Use: Never used  Substance and Sexual Activity   Alcohol use: No    Alcohol/week: 0.0 standard drinks   Drug use: No   Sexual activity: Not Currently    Partners: Male  Other Topics Concern   Not on file  Social History Narrative   Not on file   Social Determinants of Health   Financial Resource  Strain:    Difficulty of Paying Living Expenses: Not on file  Food Insecurity:    Worried About Charity fundraiser in the Last Year: Not on file   [employer] of Food in the Last Year: Not on file  Transportation Needs:    Lack of Transportation (Medical): Not on file   Lack of Transportation (Non-Medical): Not on file  Physical Activity:    Days of Exercise per Week: Not on file   Minutes of Exercise per Session: Not on file  Stress:    Feeling of Stress : Not on file  Social Connections:    Frequency of Communication with Friends and Family: Not on file   Frequency of Social Gatherings with Friends and Family: Not on file   Attends Religious Services: Not on file   Active Member of Clubs or Organizations: Not on file   Attends Archivist Meetings: Not on file   Marital Status: Not on file    Additional social history is notable in that she is divorced since 63.  She has a 16 year old son.  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   Family History:  The patient's family history includes Cancer in her maternal grandmother; Coronary artery disease in her brother; Diabetes in her mother; Heart attack in her maternal grandfather; Heart attack (age of onset: 45) in her maternal aunt; Hypertension in her father and maternal grandfather; Stroke in her father and maternal grandmother.   ROS General: Negative; No fevers, chills, or night sweats;  HEENT: Negative; No changes in vision or hearing, sinus congestion, difficulty swallowing Pulmonary: Negative; No cough, wheezing, shortness of breath, hemoptysis Cardiovascular: Positive for hypertension GI: Negative; No nausea, vomiting, diarrhea, or abdominal pain GU: Negative; No dysuria, hematuria, or difficulty voiding Musculoskeletal: Negative; no myalgias, joint pain, or weakness Hematologic/Oncology: Negative; no easy bruising, bleeding Endocrine: Negative; no heat/cold intolerance; no diabetes Neuro:  Negative; no changes in balance, headaches Skin: Negative; No rashes or skin lesions Psychiatric:  Negative; No behavioral problems, depression Sleep: Positive for untreated sleep apnea.  Significant snoring, daytime sleepiness, nonrestorative sleep.  No bruxism, restless legs, hypnogognic hallucinations, no cataplexy Other comprehensive 14 point system review is negative.   PHYSICAL EXAM:   VS:  BP (!) 216/92    Pulse 68    Ht 5' 4"  (1.626 m)    Wt 219 lb 6.4 oz (99.5 kg)    LMP 12/14/2013    SpO2 100%    BMI 37.66 kg/m     Blood pressure today by me was 182/92  Wt Readings from Last 3 Encounters:  09/12/19 219 lb 6.4 oz (99.5 kg)  07/06/19 215 lb (97.5 kg)  06/21/19 215 lb (97.5 kg)    General: Alert, oriented, no distress.  Skin: normal turgor, no rashes, warm and dry HEENT: Normocephalic, atraumatic. Pupils equal round and reactive to light; sclera anicteric; extraocular muscles intact;  Nose without nasal septal hypertrophy Mouth/Parynx benign; Mallinpatti scale 3/4 Neck: No JVD, no carotid bruits; normal carotid upstroke Lungs: clear to ausculatation and percussion; no wheezing or rales Chest wall: without tenderness to palpitation Heart: PMI not displaced, RRR, s1 s2 normal, 1/6 systolic murmur, no diastolic murmur, no rubs, gallops, thrills, or heaves Abdomen: soft, nontender; no hepatosplenomehaly, BS+; abdominal aorta nontender and not dilated by palpation. Back: no CVA tenderness Pulses 2+ Musculoskeletal: full range of motion, normal strength, no joint deformities Extremities: no clubbing cyanosis or edema, Homan's sign negative  Neurologic: grossly nonfocal; Cranial nerves grossly wnl Psychologic: Normal mood and affect   Studies/Labs Reviewed:   ECG (independently read by me): NSR at 68; Lateral T wave abnormality; QTc 474  February 23, 2019 ECG (independently read by me): Sinus bradycardia 59 bpm.  Lateral ST T changes.  Prolonged QTc interval at 485 msec.   I  personally reviewed the ECG from July 28, 2018 ECG (independently read by me): Normal sinus rhythm at 66 bpm.  T wave inversion in lead I, aVL and V6.  QTc interval 471  Recent Labs: BMP Latest Ref Rng & Units 10/24/2017 09/03/2017 09/01/2017  Glucose 65 - 99 mg/dL 99 159(H) 143(H)  BUN 6 - 24 mg/dL 17 32(H) 38(H)  Creatinine 0.57 - 1.00 mg/dL 1.59(H) 1.98(H) 2.00(H)  BUN/Creat Ratio 9 - 23 11 16 19   Sodium 134 - 144 mmol/L 140 136 136  Potassium 3.5 - 5.2 mmol/L 4.0 3.8 3.5  Chloride 96 - 106 mmol/L 102 98 102  CO2 20 - 29 mmol/L 22 22 25   Calcium 8.7 - 10.2 mg/dL 8.9 8.8 8.6(L)     Hepatic Function Latest Ref Rng & Units 10/24/2017 08/31/2017 08/23/2017  Total Protein 6.0 - 8.5 g/dL 6.6 6.4 6.9  Albumin 3.5 - 5.5 g/dL 3.9 3.6 4.0  AST 0 - 40 IU/L 15 40 29  ALT 0 - 32 IU/L 18 43(H) 37(H)  Alk Phosphatase 39 - 117 IU/L 53 41 45  Total Bilirubin 0.0 - 1.2 mg/dL <0.2 0.2 0.3  Bilirubin, Direct 0.1 - 0.5 mg/dL - - -    CBC Latest Ref Rng & Units 10/24/2017 08/31/2017 08/23/2017  WBC 3.4 - 10.8 x10E3/uL 8.7 14.7(A) 8.6  Hemoglobin 11.1 - 15.9 g/dL 13.0 14.1 13.9  Hematocrit 34.0 - 46.6 % 39.2 43.1 43.1  Platelets 150 - 450 x10E3/uL 251 - 264   Lab Results  Component Value Date   MCV 84 10/24/2017   MCV 86.2 08/31/2017   MCV 86 08/23/2017   Lab Results  Component Value Date  TSH 2.890 10/24/2017   Lab Results  Component Value Date   HGBA1C 6.3 (H) 08/23/2017     BNP No results found for: BNP  ProBNP    Component Value Date/Time   PROBNP 32.0 01/24/2008 1636     Lipid Panel     Component Value Date/Time   CHOL 183 10/05/2018 0923   TRIG 251 (H) 10/05/2018 0923   HDL 29 (L) 10/05/2018 0923   CHOLHDL 6.3 (H) 10/05/2018 0923   CHOLHDL 7.0 (H) 04/01/2016 0847   VLDL 52 (H) 04/01/2016 0847   LDLCALC 110 (H) 10/05/2018 0923   LABVLDL 44 (H) 10/05/2018 4081     RADIOLOGY: No results found.   Additional studies/ records that were reviewed today include:  I  reviewed the patient's evaluations with Dr. Gwenlyn Found.  Her home sleep study was again  reviewed with her in detail.  ASSESSMENT:    1. OSA (obstructive sleep apnea)   2. Nocturnal hypoxemia   3. Accelerated hypertension   4. Coronary artery disease involving native coronary artery of native heart without angina pectoris   5. CKD (chronic kidney disease) stage 4, GFR 15-29 ml/min (HCC)   6. Moderate obesity   7. Hyperlipidemia with target LDL less than 70     PLAN:  [name] is a 60 year old female who has a history of moderate obesity, known CAD dating back to 2010, as well as progressive renal insufficiency and mixed hyperlipidemia.  She admits to loud snoring and in July 2018 a home sleep study demonstrated overall mild sleep apnea.  However I suspect she probably had very significant sleep apnea during REM sleep which is unable to be assessed on the home study.  During that evaluation she had severe oxygen desaturation to a nadir of 74% which I suspect most likely occurred in the setting of severe REM related sleep apnea.  On that evaluation she snored for almost 50% of the night.  She has had significant cardiovascular health issues with difficulty with blood pressure control and as is well-documented this can be correlated to the severity of sleep apnea during REM sleep.  I had scheduled her for a reassessment of her sleep apnea with a split-night protocol and in lab CPAP titration.  Unfortunately this was initially denied by her insurance company.  When I saw her in February her symptoms had gotten worse.  We again tried to reschedule her for split-night study which was denied and apparently she had a second home study which again confirmed severe oxygen desaturation to a nadir of 73%.  A CPAP titration was also denied.  He has had difficult to control hypertension which has also been contributed by her poorly controlled sleep apnea which I suspect is severe during REM sleep.  She has developed  progressive renal insufficiency with most recent serum creatinine in January increasing to 2.9.  She is followed by Dr. Carolin Sicks.  Her blood pressure today is significantly elevated.  She is on amlodipine 10 mg daily, carvedilol 25 mg twice a day, hydralazine 100 mg 3 times a day in addition to isosorbide mononitrate 60 mg.  She has an appointment to see Dr. Alvester Chou later this week.  I have recommended she increase her clonidine to at least 0.2 mg twice a day until her evaluation with him.  Further medication adjustment most likely will be necessary.  We will try to expedite AutoPap initiation and I will start her on a pressure range of initially at 7 to 20 cm of  water with EPR of 3 an auto ramp.  I will see her back in 6 to 8 weeks for follow-up evaluation.  We also discussed the importance of weight loss and exercise.  She will also follow-up with her nephrologist Dr. Noel Journey   Medication Adjustments/Labs and Tests Ordered: Current medicines are reviewed at length with the patient today.  Concerns regarding medicines are outlined above.  Medication changes, Labs and Tests ordered today are listed in the Patient Instructions below. Patient Instructions  Medication Instructions:  INCREASE YOUR CLONIDINE TO 0.2MG (1 TABLET) TWICE A DAY  *If you need a refill on your cardiac medications before your next appointment, please call your pharmacy*    Follow-Up: At [facility], you and your health needs are our priority.  As part of our continuing mission to provide you with exceptional heart care, we have created designated Provider Care Teams.  These Care Teams include your primary Cardiologist (physician) and Advanced Practice Providers (APPs -  Physician Assistants and Nurse Practitioners) who all work together to provide you with the care you need, when you need it.  We recommend signing up for the patient portal called "MyChart".  Sign up information is provided on this After Visit Summary.  MyChart  is used to connect with patients for Virtual Visits (Telemedicine).  Patients are able to view lab/test results, encounter notes, upcoming appointments, etc.  Non-urgent messages can be sent to your provider as well.   To learn more about what you can do with MyChart, go to [email or web address].    Your next appointment:   6-8 week(s)  The format for your next appointment:   In Person  Provider:   Shelva Majestic, MD       Signed, Shelva Majestic, MD  09/13/2019 5:15 PM    [facility] [address], [place], [place], [place]  [address] Phone: [phone]

## 2019-09-12 NOTE — Patient Instructions (Signed)
Medication Instructions:  INCREASE YOUR CLONIDINE TO 0.2MG  (1 TABLET) TWICE A DAY  *If you need a refill on your cardiac medications before your next appointment, please call your pharmacy*    Follow-Up: At [facility], you and your health needs are our priority.  As part of our continuing mission to provide you with exceptional heart care, we have created designated Provider Care Teams.  These Care Teams include your primary Cardiologist (physician) and Advanced Practice Providers (APPs -  Physician Assistants and Nurse Practitioners) who all work together to provide you with the care you need, when you need it.  We recommend signing up for the patient portal called "MyChart".  Sign up information is provided on this After Visit Summary.  MyChart is used to connect with patients for Virtual Visits (Telemedicine).  Patients are able to view lab/test results, encounter notes, upcoming appointments, etc.  Non-urgent messages can be sent to your provider as well.   To learn more about what you can do with MyChart, go to [email or web address].    Your next appointment:   6-8 week(s)  The format for your next appointment:   In Person  Provider:   Shelva Majestic, MD

## 2019-09-13 ENCOUNTER — Encounter: Payer: Self-pay | Admitting: Cardiovascular Disease

## 2019-09-14 ENCOUNTER — Ambulatory Visit (INDEPENDENT_AMBULATORY_CARE_PROVIDER_SITE_OTHER): Payer: Managed Care, Other (non HMO) | Admitting: Cardiovascular Disease

## 2019-09-14 ENCOUNTER — Telehealth: Payer: Self-pay | Admitting: *Deleted

## 2019-09-14 ENCOUNTER — Other Ambulatory Visit: Payer: Self-pay

## 2019-09-14 ENCOUNTER — Encounter: Payer: Self-pay | Admitting: Cardiovascular Disease

## 2019-09-14 VITALS — BP 138/62 | HR 62 | Ht 64.0 in | Wt 221.0 lb

## 2019-09-14 DIAGNOSIS — I10 Essential (primary) hypertension: Secondary | ICD-10-CM | POA: Diagnosis not present

## 2019-09-14 DIAGNOSIS — Z720 Tobacco use: Secondary | ICD-10-CM | POA: Diagnosis not present

## 2019-09-14 DIAGNOSIS — G4733 Obstructive sleep apnea (adult) (pediatric): Secondary | ICD-10-CM

## 2019-09-14 DIAGNOSIS — E785 Hyperlipidemia, unspecified: Secondary | ICD-10-CM

## 2019-09-14 DIAGNOSIS — I2510 Atherosclerotic heart disease of native coronary artery without angina pectoris: Secondary | ICD-10-CM | POA: Diagnosis not present

## 2019-09-14 MED ORDER — AMLODIPINE BESYLATE 10 MG PO TABS: 10.0000 mg | ORAL_TABLET | Freq: Every day | ORAL | 3 refills | Status: DC

## 2019-09-14 MED ORDER — HYDRALAZINE HCL 100 MG PO TABS: 100.0000 mg | ORAL_TABLET | Freq: Three times a day (TID) | ORAL | 3 refills | Status: DC

## 2019-09-14 NOTE — Patient Instructions (Signed)

## 2019-09-14 NOTE — Progress Notes (Signed)
09/14/2019 [name]   [birth year]  [id number]  Primary Physician Patient, No Pcp Per Primary Cardiologist: Lorretta Harp MD FACP, [place], [place], [place]  HPI:  [name] is a 59 y.o.  moderately overweight female who I last saw in the office 07/28/2018. She presented with a past medical history significant for CAD by cath in 2010 and in April 2013,and 2014.Marland Kitchen She had an abnormal Myoview suggesting anterior ischemia prior to her repeat cath in 2013. Cath has shown 60% LAD stenosis, 50% mid RCA stenosis, 70%, distal RCA stenosis with normal LV function. She has not seen [place] as an OP. 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 She denies vomiting, SOB, or diaphoresis. She denies any abdominal pain or bloating. She went to Urgent Care and was told they could not rule out a heart attack based on her EKG. She was transferred to [facility] ER by ambulance. NTG helped her symptoms en route and she has not had recurrence of chest pain. Her EKG here is normal with NSST changes and her Troponin is negative X 3. She underwent cardiac catheterization by Dr. Debara Pickett on 09/01/12 revealing unchanged anatomy from her prior catheter because of 13 with a 60% RCA lesion and otherwise scattered noncritical disease with normal LV function. Medical therapy was recommended. She has had no recurrent symptoms.  Since I saw her a year ago her major issues have been progressive renal insufficiency and difficult to control hypertension on multiple antihypertensive medications. Her creatinine has risen from 1.3 up to 2 for unclear reasons. She was recently started on lisinopril by her nephrologist. She stopped smoking last year and is aware of salt avoidance.  Since I saw her a year ago she is remained stable.  She denies chest pain or shortness of breath.  She has seen Dr. Claiborne Billings for her obstructive sleep apnea who is in the process of fitting her for  CPAP.  Current Meds  Medication Sig  . albuterol (PROVENTIL HFA;VENTOLIN HFA) 108 (90 Base) MCG/ACT inhaler Inhale 2 puffs into the lungs every 4 (four) hours as needed.  Marland Kitchen amLODipine (NORVASC) 10 MG tablet Take 1 tablet (10 mg total) by mouth daily.  Marland Kitchen aspirin 81 MG tablet Take 81 mg by mouth at bedtime.  . carvedilol (COREG) 25 MG tablet TAKE 1 TABLET (25 MG TOTAL) BY MOUTH 2 (TWO) TIMES DAILY WITH A MEAL. TO REPLACE METOPROLOL  . cloNIDine (CATAPRES) 0.2 MG tablet Take 1 tablet (0.2 mg total) by mouth 2 (two) times daily.  . hydrALAZINE (APRESOLINE) 100 MG tablet Take 1 tablet (100 mg total) by mouth 3 (three) times daily.  . isosorbide mononitrate (IMDUR) 60 MG 24 hr tablet Take 60 mg by mouth daily.  Loma Boston Calcium 500 MG TABS Take 1 tablet by mouth 2 (two) times daily.  . [DISCONTINUED] amLODipine (NORVASC) 10 MG tablet Take 1 tablet (10 mg total) by mouth daily.  . [DISCONTINUED] hydrALAZINE (APRESOLINE) 100 MG tablet TAKE 1 TABLET BY MOUTH THREE TIMES A DAY     Allergies  Allergen Reactions  . Bupropion     Other reaction(s): Other Suicidal thoughts and increased depression  . Doxazosin Mesylate     Increased heart rate  . Rosuvastatin Other (See Comments)    myalgia  . Simvastatin     myalgias  . Wellbutrin [Bupropion Hcl]     unknown  . Zetia [Ezetimibe]  myalgias    Social History   Socioeconomic History  . Marital status: Divorced    Spouse name: Not on file  . Number of children: 1  . Years of education: Not on file  . Highest education level: Not on file  Occupational History  . Occupation: Lobbyist: [employer]  Tobacco Use  . Smoking status: Current Every Day Smoker    Packs/day: 0.25    Years: 30.00    Pack years: 7.50    Types: Cigarettes  . Smokeless tobacco: Never Used  . Tobacco comment: 2-  weeks  Vaping Use  . Vaping Use: Never used  Substance and Sexual Activity  . Alcohol use: No    Alcohol/week: 0.0 standard  drinks  . Drug use: No  . Sexual activity: Not Currently    Partners: Male  Other Topics Concern  . Not on file  Social History Narrative  . Not on file   Social Determinants of Health   Financial Resource Strain:   . Difficulty of Paying Living Expenses: Not on file  Food Insecurity:   . Worried About Charity fundraiser in the Last Year: Not on file  . Ran Out of Food in the Last Year: Not on file  Transportation Needs:   . Lack of Transportation (Medical): Not on file  . Lack of Transportation (Non-Medical): Not on file  Physical Activity:   . Days of Exercise per Week: Not on file  . Minutes of Exercise per Session: Not on file  Stress:   . Feeling of Stress : Not on file  Social Connections:   . Frequency of Communication with Friends and Family: Not on file  . Frequency of Social Gatherings with Friends and Family: Not on file  . Attends Religious Services: Not on file  . Active Member of Clubs or Organizations: Not on file  . Attends Archivist Meetings: Not on file  . Marital Status: Not on file  Intimate Partner Violence:   . Fear of Current or Ex-Partner: Not on file  . Emotionally Abused: Not on file  . Physically Abused: Not on file  . Sexually Abused: Not on file     Review of Systems: General: negative for chills, fever, night sweats or weight changes.  Cardiovascular: negative for chest pain, dyspnea on exertion, edema, orthopnea, palpitations, paroxysmal nocturnal dyspnea or shortness of breath Dermatological: negative for rash Respiratory: negative for cough or wheezing Urologic: negative for hematuria Abdominal: negative for nausea, vomiting, diarrhea, bright red blood per rectum, melena, or hematemesis Neurologic: negative for visual changes, syncope, or dizziness All other systems reviewed and are otherwise negative except as noted above.    Blood pressure 138/62, pulse 62, height 5\' 4"  (1.626 m), weight 221 lb (100.2 kg), last menstrual  period 12/14/2013.  General appearance: alert and no distress Neck: no adenopathy, no carotid bruit, no JVD, supple, symmetrical, trachea midline and thyroid not enlarged, symmetric, no tenderness/mass/nodules Lungs: clear to auscultation bilaterally Heart: regular rate and rhythm, S1, S2 normal, no murmur, click, rub or gallop Extremities: extremities normal, atraumatic, no cyanosis or edema Pulses: 2+ and symmetric Skin: Skin color, texture, turgor normal. No rashes or lesions Neurologic: Alert and oriented X 3, normal strength and tone. Normal symmetric reflexes. Normal coordination and gait  EKG sinus rhythm at 82 with lateral T wave inversion.  This is unchanged from prior EKGs.  I personally reviewed this EKG.  ASSESSMENT AND PLAN:   CAD (  coronary artery disease) History of noncritical CAD CAD by cath back in 2010 as well as April 2013 and 2014.  She currently denies chest pain or shortness of breath.  Essential hypertension History of essential hypertension blood pressure measured today 138/62.  She is on amlodipine, carvedilol, clonidine and hydralazine.  Dyslipidemia- LDL goal < 70 History of hyperlipidemia intolerant to statin therapy with lipid profile recently performed 10/05/2018 revealing total cluster 183, LDL of 110 and HDL of 29.  Tobacco abuse Discontinued  Sleep apnea History of obstructive sleep apnea.  She saw Dr. Claiborne Billings yesterday and apparently is going to be fitted for CPAP.      Lorretta Harp MD FACP,FACC,FAHA, [facility] 09/14/2019 12:01 PM

## 2019-09-14 NOTE — Assessment & Plan Note (Signed)
Discontinued

## 2019-09-14 NOTE — Assessment & Plan Note (Signed)
History of noncritical CAD CAD by cath back in 2010 as well as April 2013 and 2014.  She currently denies chest pain or shortness of breath.

## 2019-09-14 NOTE — Assessment & Plan Note (Signed)
History of hyperlipidemia intolerant to statin therapy with lipid profile recently performed 10/05/2018 revealing total cluster 183, LDL of 110 and HDL of 29.

## 2019-09-14 NOTE — Assessment & Plan Note (Signed)
History of essential hypertension blood pressure measured today 138/62.  She is on amlodipine, carvedilol, clonidine and hydralazine.

## 2019-09-14 NOTE — Assessment & Plan Note (Signed)
History of obstructive sleep apnea.  She saw Dr. Claiborne Billings yesterday and apparently is going to be fitted for CPAP.

## 2019-09-14 NOTE — Telephone Encounter (Signed)
Auto CPAP order faxed to [facility].

## 2019-10-02 ENCOUNTER — Telehealth: Payer: Self-pay | Admitting: *Deleted

## 2019-10-02 NOTE — Telephone Encounter (Signed)
Received a message from Draper @ [facility] stating she forwarded the CPAP referral to [facility]. [facility] is not in network with her insurance.

## 2019-10-04 ENCOUNTER — Other Ambulatory Visit: Payer: Self-pay

## 2019-10-04 DIAGNOSIS — E785 Hyperlipidemia, unspecified: Secondary | ICD-10-CM

## 2019-10-04 MED ORDER — REPATHA SURECLICK 140 MG/ML ~~LOC~~ SOAJ
140.0000 mg | SUBCUTANEOUS | 11 refills | Status: DC
Start: 2019-10-04 — End: 2020-09-29

## 2019-10-04 NOTE — Telephone Encounter (Signed)
Called pt to see why they haven't started the repatha and she stated that it was the cost so I enrolled her in a copay card program and instructed the pt to come back after their fourth injection fasting so that we can get lipids and lft completed and the orders were placed

## 2019-10-13 ENCOUNTER — Ambulatory Visit
Admission: EM | Admit: 2019-10-13 | Discharge: 2019-10-13 | Disposition: A | Discharge status: Home / Self Care | Payer: Managed Care, Other (non HMO) | Attending: Family Medicine | Admitting: Family Medicine

## 2019-10-13 ENCOUNTER — Other Ambulatory Visit: Payer: Self-pay

## 2019-10-13 ENCOUNTER — Encounter: Payer: Self-pay | Admitting: Emergency Medicine

## 2019-10-13 ENCOUNTER — Other Ambulatory Visit (HOSPITAL_COMMUNITY): Payer: Self-pay | Admitting: Family Medicine

## 2019-10-13 DIAGNOSIS — M79661 Pain in right lower leg: Secondary | ICD-10-CM | POA: Diagnosis not present

## 2019-10-13 DIAGNOSIS — M7989 Other specified soft tissue disorders: Secondary | ICD-10-CM

## 2019-10-13 DIAGNOSIS — R52 Pain, unspecified: Secondary | ICD-10-CM | POA: Insufficient documentation

## 2019-10-13 MED ORDER — ENOXAPARIN SODIUM 30 MG/0.3ML ~~LOC~~ SOLN: 30.0000 mg | Freq: Two times a day (BID) | SUBCUTANEOUS | 0 refills | Status: DC

## 2019-10-13 NOTE — Discharge Instructions (Signed)
You are being prescribed Lovenox one-time dose inject directly into the fat pads of your abdomen.  Be careful not to cut or bump up against anything after skin will easily bleed.

## 2019-10-13 NOTE — ED Triage Notes (Signed)
Pt here with swelling to right leg with some tingling x 1 week; pt sts feels warm; denies injury

## 2019-10-13 NOTE — ED Provider Notes (Signed)
[name]    CSN: [id number] Arrival date & time: 10/13/19  1335      History   Chief Complaint Chief Complaint  Patient presents with  . Leg Swelling    HPI [name] is a 59 y.o. female.   HPI  Patient presents today with 1 week of right leg pain and swelling gradually worsening of swelling.  Patient has a history of PEs or DVTs.  She has a significant history of CAD, hyperlipidemia, and is a current smoker and is a diabetic.  Patient reports 2 weeks ago traveling to [facility] however endorses stopping to ambulate.  She also works from home and endorses prolonged immobility while working.  She is also having some neuropathic symptoms pain in the tip of her toes.  She is currently not taking any anticoagulants or ASA.  Past Medical History:  Diagnosis Date  . Angina   . CAD (coronary artery disease), non obstructive CAD in 2010    a. 2013 Cath: LAD 60, RCA 50m, 70d; b. 2014 Cath: stable anatomy.  . Chronic kidney disease    polycystic kidney disease , elevated creatine  . COPD (chronic obstructive pulmonary disease) ([place])   . Diabetes mellitus without complication ([place])    a. 12/2014 HbA1c 6.5; b. Rx Glipizide - not taking.  . Family history of premature CAD   . Hx of adenomatous colonic polyps 06/11/2016  . Hyperlipidemia   . Hypertension   . Hypertensive heart disease   . Palpitations   . Reflux   . Sleep apnea    mild, no c-pap at this point  . Tobacco abuse     Patient Active Problem List   Diagnosis Date Noted  . 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 Hx of adenomatous colonic polyps 06/11/2016  . Diverticulosis 06/07/2016  . Sleep apnea 03/18/2016  . CRI (chronic renal insufficiency), stage 3 (moderate) ([place]) 03/18/2016  . Increased risk of breast cancer 12/17/2015  . Hypertensive heart disease   . Hematoma of groin - right, s/p cath; stable on discharge 09/02/2012  . Pleuritic chest pain  08/31/2012  . Obesity (BMI 30-39.9) 08/31/2012  . CAD (coronary artery disease) 04/15/2011  . Abnormal stress test, Lexiscan myoview 03/25/2011-sm. anterior ischemia 04/15/2011  . Essential hypertension 04/15/2011  . Dyslipidemia- LDL goal < 70 04/15/2011  . Metabolic syndrome 85/27/7824  . Tobacco abuse 04/15/2011  . Family history of premature CAD 04/15/2011    Past Surgical History:  Procedure Laterality Date  . APPENDECTOMY  1974  . CARDIAC CATHETERIZATION  02/07/2008   Recommendation-F/U stress test  . CARDIAC CATHETERIZATION  04/16/2011   Recommendation-Increase medical therapy trial  . CARDIOVASCULAR STRESS TEST  03/25/2011   Suspect subtle anterior septal ischemia  . COLONOSCOPY    . FRACTURE SURGERY Left 1972   forearm fracture  . LEFT HEART CATHETERIZATION WITH CORONARY ANGIOGRAM N/A 04/16/2011   Procedure: LEFT HEART CATHETERIZATION WITH CORONARY ANGIOGRAM;  Surgeon: Troy Sine, MD;  Location: [facility] CATH LAB;  Service: Cardiovascular;  Laterality: N/A;  . LEFT HEART CATHETERIZATION WITH CORONARY ANGIOGRAM N/A 09/01/2012   Procedure: LEFT HEART CATHETERIZATION WITH CORONARY ANGIOGRAM;  Surgeon: Pixie Casino, MD;  Location: [facility] CATH LAB;  Service: Cardiovascular;  Laterality: N/A;  . POLYPECTOMY    . RENAL DOPPLER  06/05/2009   No evidence of significant diameter reduction  . TRANSTHORACIC ECHOCARDIOGRAM  01/25/2008   EF 60-65%, Moderate concentric LVH  . WRIST FRACTURE SURGERY Right ~  1996    OB History   No obstetric history on file.      Home Medications    Prior to Admission medications   Medication Sig Start Date End Date Taking? Authorizing Provider  albuterol (PROVENTIL HFA;VENTOLIN HFA) 108 (90 Base) MCG/ACT inhaler Inhale 2 puffs into the lungs every 4 (four) hours as needed. 08/23/17   Jaynee Eagles, PA-C  amLODipine (NORVASC) 10 MG tablet Take 1 tablet (10 mg total) by mouth daily. 09/14/19   Lorretta Harp, MD  aspirin 81 MG tablet Take 81 mg by mouth at  bedtime.    [provider]  carvedilol (COREG) 25 MG tablet TAKE 1 TABLET (25 MG TOTAL) BY MOUTH 2 (TWO) TIMES DAILY WITH A MEAL. TO REPLACE METOPROLOL 04/20/19   Lorretta Harp, MD  cloNIDine (CATAPRES) 0.2 MG tablet Take 1 tablet (0.2 mg total) by mouth 2 (two) times daily. 09/12/19   Troy Sine, MD  Evolocumab (REPATHA SURECLICK) 323 MG/ML SOAJ Inject 140 mg into the skin every 14 (fourteen) days. 10/04/19   Lorretta Harp, MD  hydrALAZINE (APRESOLINE) 100 MG tablet Take 1 tablet (100 mg total) by mouth 3 (three) times daily. 09/14/19   Lorretta Harp, MD  isosorbide mononitrate (IMDUR) 60 MG 24 hr tablet Take 60 mg by mouth daily.    [provider]  Loma Boston Calcium 500 MG TABS Take 1 tablet by mouth 2 (two) times daily. 04/18/19   [provider]    Family History Family History  Problem Relation Age of Onset  . Stroke Father   . Hypertension Father   . Coronary artery disease Brother   . Diabetes Mother   . Stroke Maternal Grandmother   . Cancer Maternal Grandmother        Breast cancer  . Hypertension Maternal Grandfather   . Heart attack Maternal Grandfather   . Heart attack Maternal Aunt 41  . Colon cancer Neg Hx   . Esophageal cancer Neg Hx   . Stomach cancer Neg Hx   . Rectal cancer Neg Hx     Social History Social History   Tobacco Use  . Smoking status: Current Every Day Smoker    Packs/day: 0.25    Years: 30.00    Pack years: 7.50    Types: Cigarettes  . Smokeless tobacco: Never Used  . Tobacco comment: 2-  weeks  Vaping Use  . Vaping Use: Never used  Substance Use Topics  . Alcohol use: No    Alcohol/week: 0.0 standard drinks  . Drug use: No     Allergies   Bupropion, Doxazosin mesylate, Rosuvastatin, Simvastatin, Wellbutrin [bupropion hcl], and Zetia [ezetimibe]   Review of Systems Review of Systems Pertinent negatives listed in HPI  Physical Exam Triage Vital Signs ED Triage Vitals  Enc Vitals Group      BP 10/13/19 1452 (!) 186/78     Pulse Rate 10/13/19 1452 64     Resp 10/13/19 1452 20     Temp 10/13/19 1452 97.6 F (36.4 C)     Temp Source 10/13/19 1452 Oral     SpO2 10/13/19 1452 91 %     Weight --      Height --      Head Circumference --      Peak Flow --      Pain Score 10/13/19 1501 4     Pain Loc --      Pain Edu? --  Excl. in GC? --    No data found.  Updated Vital Signs BP (!) 186/78 (BP Location: Left Arm)   Pulse 64   Temp 97.6 F (36.4 C) (Oral)   Resp 20   LMP 12/14/2013   SpO2 91%   Visual Acuity Right Eye Distance:   Left Eye Distance:   Bilateral Distance:    Right Eye Near:   Left Eye Near:    Bilateral Near:     Physical Exam Vitals reviewed.  Constitutional:      Appearance: She is obese.  Cardiovascular:     Rate and Rhythm: Normal rate and regular rhythm.     Pulses: Normal pulses.  Pulmonary:     Effort: Pulmonary effort is normal.     Breath sounds: Decreased breath sounds present.  Musculoskeletal:     Cervical back: Normal range of motion.  Psychiatric:        Attention and Perception: Attention normal.        Mood and Affect: Mood and affect normal.    UC Treatments / Results  Labs (all labs ordered are listed, but only abnormal results are displayed) Labs Reviewed - No data to display  EKG   Radiology No results found.  Procedures Procedures (including critical care time)  Medications Ordered in UC Medications - No data to display  Initial Impression / Assessment and Plan / UC Course  I have reviewed the triage vital signs and the nursing notes.  Pertinent labs & imaging results that were available during my care of the patient were reviewed by me and considered in my medical decision making (see chart for details).      Wells Score is calculated as of 5 which is consistent with a highly likelihood of DVT.  Given associated factors obesity, current daily smoker, and  recent changes in mobility immobility,   patient warrants a DVT study work-up to rule out an acute thrombosis.  Given to patient associated cardiac history will place on a prophylactic dose of Lovenox 30 mg which is a renal corrected dose.  She will inject medication when she obtains the medication tonight at home.  If study is positive patient is to be further managed in the setting of the ER.  Ultrasound tech made aware that patient should be seen at the ER if abnormal.  Patient given a work note as I explained do not want her doing any heavy lifting or vigorous work until study is performed.  Red flags given if any symptoms worsen or she starts experiencing any daily dizziness chest pain or headache or weakness go immediately to the ER.  Patient's blood pressure was grossly elevated x2 readings here in clinic today.  Patient admitted to not taking her clonidine which she is to take twice a day for blood pressure.  Advised to take as soon as she gets home and monitor blood pressure this evening if readings are not below 160/100, I did advise her not to wait until the morning but to go immediately to the ER.  Final Clinical Impressions(s) / UC Diagnoses   Final diagnoses:  Pain and swelling of right lower leg     Discharge Instructions     You are being prescribed Lovenox one-time dose inject directly into the fat pads of your abdomen.  Be careful not to cut or bump up against anything after skin will easily bleed.    ED Prescriptions    Medication Sig Dispense Auth. Provider   enoxaparin (LOVENOX)  30 MG/0.3ML injection Inject 0.3 mLs (30 mg total) into the skin every 12 (twelve) hours. One time dose prior tonight prior to DVT study 0.3 mL Scot Jun, FNP     PDMP not reviewed this encounter.    This office note has been dictated through [place].     Scot Jun, FNP 10/13/19 [phone]

## 2019-10-14 ENCOUNTER — Ambulatory Visit (HOSPITAL_BASED_OUTPATIENT_CLINIC_OR_DEPARTMENT_OTHER)
Admission: RE | Admit: 2019-10-14 | Discharge: 2019-10-14 | Disposition: A | Discharge status: Home / Self Care | Payer: Managed Care, Other (non HMO) | Source: Ambulatory Visit | Attending: Family Medicine | Admitting: Family Medicine

## 2019-10-14 ENCOUNTER — Telehealth: Payer: Self-pay | Admitting: Family Medicine

## 2019-10-14 DIAGNOSIS — M79661 Pain in right lower leg: Secondary | ICD-10-CM | POA: Diagnosis present

## 2019-10-14 DIAGNOSIS — M7989 Other specified soft tissue disorders: Secondary | ICD-10-CM | POA: Diagnosis present

## 2019-10-14 DIAGNOSIS — R52 Pain, unspecified: Secondary | ICD-10-CM | POA: Diagnosis present

## 2019-10-14 NOTE — Progress Notes (Signed)
VASCULAR LAB    Right lower extremity venous duplex has been performed.  See CV proc for preliminary results.   Vannary Greening, RVT 10/14/2019, 8:04 AM

## 2019-10-14 NOTE — Telephone Encounter (Signed)
Contact patient to advise DVT study was negative. The swelling could be a result of her heart disease or kidney disease therefore, I would like to avoid prescribing any diuretic therapy as I am unaware of her most recent kidney functioning.  I would recommend initially that she follows up with her primary or she can contact her nephrologist directly to let them know the problem that she is having she may need to be seen sooner than her next scheduled appointment.  If unable to be seen by nephrology I would contact cardiology because she is having some significant unilateral swelling which needs to be followed up on.

## 2019-10-15 NOTE — Telephone Encounter (Signed)
Attempted to call patient x 1, LVM for return call

## 2019-10-16 ENCOUNTER — Telehealth (HOSPITAL_COMMUNITY): Payer: Self-pay | Admitting: Emergency Medicine

## 2019-10-16 NOTE — Telephone Encounter (Signed)
Reached patient and reviewed [name], APPs notes with patient.  States she has a nephrology appointment for  [facility] October.  Encouraged her to follow up with PCP.  States she does not currently have one.  This RN provider her info for Primary Care at [facility] and then encouraged her to reach out to cardiology to see if they have anything sooner.  Patient verbalized understanding, all questions answered.

## 2019-10-18 ENCOUNTER — Encounter: Payer: Self-pay | Admitting: Cardiology

## 2019-10-18 ENCOUNTER — Other Ambulatory Visit: Payer: Self-pay

## 2019-10-18 ENCOUNTER — Ambulatory Visit (INDEPENDENT_AMBULATORY_CARE_PROVIDER_SITE_OTHER): Payer: Managed Care, Other (non HMO) | Admitting: Cardiology

## 2019-10-18 VITALS — BP 162/82 | HR 65 | Ht 64.0 in | Wt 226.6 lb

## 2019-10-18 DIAGNOSIS — I119 Hypertensive heart disease without heart failure: Secondary | ICD-10-CM

## 2019-10-18 DIAGNOSIS — Z720 Tobacco use: Secondary | ICD-10-CM

## 2019-10-18 DIAGNOSIS — I2510 Atherosclerotic heart disease of native coronary artery without angina pectoris: Secondary | ICD-10-CM | POA: Diagnosis not present

## 2019-10-18 DIAGNOSIS — N2889 Other specified disorders of kidney and ureter: Secondary | ICD-10-CM

## 2019-10-18 DIAGNOSIS — E785 Hyperlipidemia, unspecified: Secondary | ICD-10-CM

## 2019-10-18 DIAGNOSIS — G4733 Obstructive sleep apnea (adult) (pediatric): Secondary | ICD-10-CM

## 2019-10-18 DIAGNOSIS — I5031 Acute diastolic (congestive) heart failure: Secondary | ICD-10-CM

## 2019-10-18 DIAGNOSIS — R609 Edema, unspecified: Secondary | ICD-10-CM | POA: Insufficient documentation

## 2019-10-18 DIAGNOSIS — E65 Localized adiposity: Secondary | ICD-10-CM

## 2019-10-18 DIAGNOSIS — I10 Essential (primary) hypertension: Secondary | ICD-10-CM

## 2019-10-18 DIAGNOSIS — N183 Chronic kidney disease, stage 3 unspecified: Secondary | ICD-10-CM | POA: Diagnosis not present

## 2019-10-18 NOTE — Assessment & Plan Note (Signed)
I encouraged her to follow through with c-pap

## 2019-10-18 NOTE — Progress Notes (Signed)
Cardiology Office Note:    Date:  10/18/2019   ID:  [name], DOB Mar 24, 1960, MRN [id number]  PCP:  Patient, No Pcp Per  Cardiologist:  Quay Burow, MD  Electrophysiologist:  None   Referring MD: No ref. provider found   CC: Rt LE edema  History of Present Illness:    [name] is a 59 y.o. female with a hx of moderate coronary disease by catheterization in 2010, 2013, in 2014. She has a 60% LAD and 50% RCA and 70% distal RCA. Myoview done 2013 showed no significant ischemia with an normal ejection fraction.   She recently saw Dr. Gwenlyn Found in September 2021. She is noted to have chronic renal insufficiency, followed by a nephrologist in [facility]. She also has sleep apnea, CPAP titration is pending and she is awaiting her CPAP.  Patient presented to an urgent care 10/13/2019 with right lower extremity edema. They were concerned she had a DVT and she was sent to the emergency room. LE venous Doppler were negative for DVT. Her blood pressure was elevated. She is seen in the office today for follow-up. She denies any orthopnea or increased dyspnea on exertion. She is noted right lower extremity edema more than left. On exam she has trace right lower extremity edema. Her blood pressure is 172/70.  Past Medical History:  Diagnosis Date  . Angina   . CAD (coronary artery disease), non obstructive CAD in 2010    a. 2013 Cath: LAD 60, RCA 45m, 70d; b. 2014 Cath: stable anatomy.  . Chronic kidney disease    polycystic kidney disease , elevated creatine  . COPD (chronic obstructive pulmonary disease) ([place])   . Diabetes mellitus without complication ([place])    a. 12/2014 HbA1c 6.5; b. Rx Glipizide - not taking.  . Family history of premature CAD   . Hx of adenomatous colonic polyps 06/11/2016  . Hyperlipidemia   . Hypertension   . Hypertensive heart disease   . Palpitations   . Reflux   . Sleep apnea    mild, no c-pap at this point  . Tobacco abuse     Past Surgical  History:  Procedure Laterality Date  . APPENDECTOMY  1974  . CARDIAC CATHETERIZATION  02/07/2008   Recommendation-F/U stress test  . CARDIAC CATHETERIZATION  04/16/2011   Recommendation-Increase medical therapy trial  . CARDIOVASCULAR STRESS TEST  03/25/2011   Suspect subtle anterior septal ischemia  . COLONOSCOPY    . FRACTURE SURGERY Left 1972   forearm fracture  . LEFT HEART CATHETERIZATION WITH CORONARY ANGIOGRAM N/A 04/16/2011   Procedure: LEFT HEART CATHETERIZATION WITH CORONARY ANGIOGRAM;  Surgeon: Troy Sine, MD;  [facility] CATH LAB;  Service: Cardiovascular;  Laterality: N/A;  . LEFT HEART CATHETERIZATION WITH CORONARY ANGIOGRAM N/A 09/01/2012   Procedure: LEFT HEART CATHETERIZATION WITH CORONARY ANGIOGRAM;  Surgeon: Pixie Casino, MD;  Location: [facility] CATH LAB;  Service: Cardiovascular;  Laterality: N/A;  . POLYPECTOMY    . RENAL DOPPLER  06/05/2009   No evidence of significant diameter reduction  . TRANSTHORACIC ECHOCARDIOGRAM  01/25/2008   EF 60-65%, Moderate concentric LVH  . WRIST FRACTURE SURGERY Right ~ 1996    Current Medications: Current Meds  Medication Sig  . albuterol (PROVENTIL HFA;VENTOLIN HFA) 108 (90 Base) MCG/ACT inhaler Inhale 2 puffs into the lungs every 4 (four) hours as needed.  Marland Kitchen amLODipine (NORVASC) 10 MG tablet Take 1 tablet (10 mg total) by mouth daily.  Marland Kitchen aspirin 81 MG tablet Take  81 mg by mouth at bedtime.  . carvedilol (COREG) 25 MG tablet TAKE 1 TABLET (25 MG TOTAL) BY MOUTH 2 (TWO) TIMES DAILY WITH A MEAL. TO REPLACE METOPROLOL  . cloNIDine (CATAPRES) 0.2 MG tablet Take 1 tablet (0.2 mg total) by mouth 2 (two) times daily.  . Evolocumab (REPATHA SURECLICK) 850 MG/ML SOAJ Inject 140 mg into the skin every 14 (fourteen) days.  . hydrALAZINE (APRESOLINE) 100 MG tablet Take 1 tablet (100 mg total) by mouth 3 (three) times daily.  . isosorbide mononitrate (IMDUR) 60 MG 24 hr tablet Take 60 mg by mouth daily.  [place] Calcium 500 MG TABS Take 1  tablet by mouth 2 (two) times daily.     Allergies:   Bupropion, Doxazosin mesylate, Rosuvastatin, Simvastatin, Wellbutrin [bupropion hcl], and Zetia [ezetimibe]   Social History   Socioeconomic History  . Marital status: Divorced    Spouse name: Not on file  . Number of children: 1  . Years of education: Not on file  . Highest education level: Not on file  Occupational History  . Occupation: Lobbyist: [employer]  Tobacco Use  . Smoking status: Former Smoker    Packs/day: 0.25    Years: 30.00    Pack years: 7.50    Types: Cigarettes  . Smokeless tobacco: Never Used  . Tobacco comment: 2-  weeks  Vaping Use  . Vaping Use: Never used  Substance and Sexual Activity  . Alcohol use: No    Alcohol/week: 0.0 standard drinks  . Drug use: No  . Sexual activity: Not Currently    Partners: Male  Other Topics Concern  . Not on file  Social History Narrative  . Not on file   Social Determinants of Health   Financial Resource Strain:   . Difficulty of Paying Living Expenses: Not on file  Food Insecurity:   . Worried About Charity fundraiser in the Last Year: Not on file  . Ran Out of Food in the Last Year: Not on file  Transportation Needs:   . Lack of Transportation (Medical): Not on file  . Lack of Transportation (Non-Medical): Not on file  Physical Activity:   . Days of Exercise per Week: Not on file  . Minutes of Exercise per Session: Not on file  Stress:   . Feeling of Stress : Not on file  Social Connections:   . Frequency of Communication with Friends and Family: Not on file  . Frequency of Social Gatherings with Friends and Family: Not on file  . Attends Religious Services: Not on file  . Active Member of Clubs or Organizations: Not on file  . Attends Archivist Meetings: Not on file  . Marital Status: Not on file     Family History: The patient's family history includes Cancer in her maternal grandmother; Coronary artery disease in  her brother; Diabetes in her mother; Heart attack in her maternal grandfather; Heart attack (age of onset: 53) in her maternal aunt; Hypertension in her father and maternal grandfather; Stroke in her father and maternal grandmother. There is no history of Colon cancer, Esophageal cancer, Stomach cancer, or Rectal cancer.  ROS:   Please see the history of present illness.     All other systems reviewed and are negative.  EKGs/Labs/Other Studies Reviewed:    The following studies were reviewed today:   EKG:  EKG is not ordered today.  The ekg ordered 09/14/2019 demonstrates NSR, lateral TWI  Recent Labs: No results found for requested labs within last 8760 hours.  Recent Lipid Panel    Component Value Date/Time   CHOL 183 10/05/2018 0923   TRIG 251 (H) 10/05/2018 0923   HDL 29 (L) 10/05/2018 0923   CHOLHDL 6.3 (H) 10/05/2018 0923   CHOLHDL 7.0 (H) 04/01/2016 0847   VLDL 52 (H) 04/01/2016 0847   LDLCALC 110 (H) 10/05/2018 2707    Physical Exam:    VS:  BP (!) 162/82   Pulse 65   Ht 5\' 4"  (1.626 m)   Wt 226 lb 9.6 oz (102.8 kg)   LMP 12/14/2013   SpO2 95%   BMI 38.90 kg/m     Wt Readings from Last 3 Encounters:  10/18/19 226 lb 9.6 oz (102.8 kg)  09/14/19 221 lb (100.2 kg)  09/12/19 219 lb 6.4 oz (99.5 kg)     GEN: Obese caucasian female, well developed in no acute distress HEENT: Normal NECK: No JVD; No carotid bruitsy CARDIAC: RRR, no murmurs, rubs, gallops RESPIRATORY:  Clear to auscultation without rales, wheezing or rhonchi  ABDOMEN: Obese, soft, non-tender, non-distended MUSCULOSKELETAL:  Trace RLE pre tibial edema edema; No deformity  SKIN: Warm and dry NEUROLOGIC:  Alert and oriented x 3 PSYCHIATRIC:  Normal affect   ASSESSMENT:    Edema Rt LE edema- dopplers negative for DVT.  I suspect this is secondary to mild volume overload.  Check BMP- consider diuretic  Hypertensive heart disease Moderate LVH with normal LVF by echo 2010 Repeat echo  CRI  (chronic renal insufficiency), stage 3 (moderate) (HCC) Followed now in [facility]- she has an upcoming appointment  CAD (coronary artery disease) Cath 2010, 2013, and Aug 2014 after abnormal Myoview- moderate disease- 60% mRCA, no significant LAD or CFX disease  Essential hypertension Uncontrolled  Sleep apnea I encouraged her to follow through with c-pap  Truncal obesity BMI 38  PLAN:    Check BNP and BMP, consider addition of a diuretic.  Check echo.  Salty Six diet.  I suggested she start taking her Norvasc in the am and her Imdur in the am- she currently takes them Q HS.  F/U with me 3 weeks.    Medication Adjustments/Labs and Tests Ordered: Current medicines are reviewed at length with the patient today.  Concerns regarding medicines are outlined above.  Orders Placed This Encounter  Procedures  . B Nat Peptide  . Basic Metabolic Panel (BMET)  . ECHOCARDIOGRAM COMPLETE   No orders of the defined types were placed in this encounter.   Patient Instructions  Medication Instructions:  TAKE- Amlodipine and Isosorbide in the morning  *If you need a refill on your cardiac medications before your next appointment, please call your pharmacy*   Lab Work: BNP and BMP Today  If you have labs (blood work) drawn today and your tests are completely normal, you will receive your results only by: Marland Kitchen MyChart Message (if you have MyChart) OR . A paper copy in the mail If you have any lab test that is abnormal or we need to change your treatment, we will call you to review the results.   Testing/Procedures: Your physician has requested that you have an echocardiogram. Echocardiography is a painless test that uses sound waves to create images of your heart. It provides your doctor with information about the size and shape of your heart and how well your heart's chambers and valves are working. This procedure takes approximately one hour. There are no restrictions for this  procedure.   Follow-Up: At [facility], you and your health needs are our priority.  As part of our continuing mission to provide you with exceptional heart care, we have created designated Provider Care Teams.  These Care Teams include your primary Cardiologist (physician) and Advanced Practice Providers (APPs -  Physician Assistants and Nurse Practitioners) who all work together to provide you with the care you need, when you need it.  We recommend signing up for the patient portal called "MyChart".  Sign up information is provided on this After Visit Summary.  MyChart is used to connect with patients for Virtual Visits (Telemedicine).  Patients are able to view lab/test results, encounter notes, upcoming appointments, etc.  Non-urgent messages can be sent to your provider as well.   To learn more about what you can do with MyChart, go to [email or web address].    Your next appointment:   3 week(s)  The format for your next appointment:   In Person  Provider:   You will see one of the following Advanced Practice Providers on your designated Care Team:    Kerin Ransom, PA-C       Signed, Kerin Ransom, [place]  10/18/2019 10:58 AM    [place]

## 2019-10-18 NOTE — Assessment & Plan Note (Signed)
Cath 2010, 2013, and Aug 2014 after abnormal Myoview- moderate disease- 60% mRCA, no significant LAD or CFX disease 

## 2019-10-18 NOTE — Assessment & Plan Note (Signed)
Uncontrolled 

## 2019-10-18 NOTE — Assessment & Plan Note (Signed)
Moderate LVH with normal LVF by echo 2010 Repeat echo

## 2019-10-18 NOTE — Assessment & Plan Note (Signed)
BMI 38 

## 2019-10-18 NOTE — Assessment & Plan Note (Signed)
Followed now in [facility]- she has an upcoming appointment

## 2019-10-18 NOTE — Patient Instructions (Signed)
Medication Instructions:  TAKE- Amlodipine and Isosorbide in the morning  *If you need a refill on your cardiac medications before your next appointment, please call your pharmacy*   Lab Work: BNP and BMP Today  If you have labs (blood work) drawn today and your tests are completely normal, you will receive your results only by: Marland Kitchen MyChart Message (if you have MyChart) OR . A paper copy in the mail If you have any lab test that is abnormal or we need to change your treatment, we will call you to review the results.   Testing/Procedures: Your physician has requested that you have an echocardiogram. Echocardiography is a painless test that uses sound waves to create images of your heart. It provides your doctor with information about the size and shape of your heart and how well your heart's chambers and valves are working. This procedure takes approximately one hour. There are no restrictions for this procedure.   Follow-Up: At [facility], you and your health needs are our priority.  As part of our continuing mission to provide you with exceptional heart care, we have created designated Provider Care Teams.  These Care Teams include your primary Cardiologist (physician) and Advanced Practice Providers (APPs -  Physician Assistants and Nurse Practitioners) who all work together to provide you with the care you need, when you need it.  We recommend signing up for the patient portal called "MyChart".  Sign up information is provided on this After Visit Summary.  MyChart is used to connect with patients for Virtual Visits (Telemedicine).  Patients are able to view lab/test results, encounter notes, upcoming appointments, etc.  Non-urgent messages can be sent to your provider as well.   To learn more about what you can do with MyChart, go to [email or web address].    Your next appointment:   3 week(s)  The format for your next appointment:   In Person  Provider:   You will see one of  the following Advanced Practice Providers on your designated Care Team:    Kerin Ransom, [place]

## 2019-10-18 NOTE — Assessment & Plan Note (Signed)
Rt LE edema- dopplers negative for DVT.  I suspect this is secondary to mild volume overload.  Check BMP- consider diuretic

## 2019-10-20 LAB — BRAIN NATRIURETIC PEPTIDE: BNP: 162.6 pg/mL — ABNORMAL HIGH (ref 0.0–100.0)

## 2019-10-20 LAB — BASIC METABOLIC PANEL
BUN/Creatinine Ratio: 11 (ref 9–23)
BUN: 48 mg/dL — ABNORMAL HIGH (ref 6–24)
CO2: 20 mmol/L (ref 20–29)
Calcium: 8.4 mg/dL — ABNORMAL LOW (ref 8.7–10.2)
Chloride: 105 mmol/L (ref 96–106)
Creatinine, Ser: 4.35 mg/dL — ABNORMAL HIGH (ref 0.57–1.00)
GFR calc Af Amer: 12 mL/min/{1.73_m2} — ABNORMAL LOW (ref >59)
GFR calc non Af Amer: 11 mL/min/{1.73_m2} — ABNORMAL LOW (ref >59)
Glucose: 182 mg/dL — ABNORMAL HIGH (ref 65–99)
Potassium: 4.1 mmol/L (ref 3.5–5.2)
Sodium: 143 mmol/L (ref 134–144)

## 2019-10-23 ENCOUNTER — Telehealth: Payer: Self-pay | Admitting: Cardiology

## 2019-10-23 NOTE — Telephone Encounter (Signed)
I called and spoke with the patient about her labs -(please see my 10/07 office note for details).  Her renal function has deteriorated significantly complared with one year ago.  Today she says she is doing well, her edema which prompted the 10/07 office visit has actually improved without addition of a diuretic. She does have an upcoming appointment with Dr Carolin Sicks in a week and I encouraged her to keep that.  I will forward my 10/07 office note and lab results to Dr Carolin Sicks.  Kerin Ransom PA-C 10/23/2019 8:47 AM

## 2019-10-31 ENCOUNTER — Other Ambulatory Visit: Payer: Self-pay | Admitting: Nephrology

## 2019-10-31 DIAGNOSIS — N184 Chronic kidney disease, stage 4 (severe): Secondary | ICD-10-CM

## 2019-11-02 ENCOUNTER — Other Ambulatory Visit: Payer: Self-pay

## 2019-11-02 ENCOUNTER — Ambulatory Visit (HOSPITAL_COMMUNITY): Payer: Managed Care, Other (non HMO) | Attending: Cardiology

## 2019-11-02 DIAGNOSIS — I5031 Acute diastolic (congestive) heart failure: Secondary | ICD-10-CM

## 2019-11-02 LAB — ECHOCARDIOGRAM COMPLETE
AR max vel: 2.15 cm2
AV Area VTI: 2.22 cm2
AV Area mean vel: 1.86 cm2
AV Mean grad: 7 mmHg
AV Peak grad: 15.7 mmHg
Ao pk vel: 1.98 m/s
Area-P 1/2: 3.91 cm2
S' Lateral: 2.7 cm

## 2019-11-06 ENCOUNTER — Ambulatory Visit
Admission: RE | Admit: 2019-11-06 | Discharge: 2019-11-06 | Disposition: A | Discharge status: Home / Self Care | Payer: Managed Care, Other (non HMO) | Source: Ambulatory Visit | Attending: Nephrology | Admitting: Nephrology

## 2019-11-06 DIAGNOSIS — N184 Chronic kidney disease, stage 4 (severe): Secondary | ICD-10-CM

## 2019-11-13 ENCOUNTER — Ambulatory Visit: Payer: Managed Care, Other (non HMO) | Admitting: Cardiovascular Disease

## 2019-11-13 ENCOUNTER — Encounter: Payer: Self-pay | Admitting: Cardiology

## 2019-11-13 ENCOUNTER — Ambulatory Visit (INDEPENDENT_AMBULATORY_CARE_PROVIDER_SITE_OTHER): Payer: Managed Care, Other (non HMO) | Admitting: Cardiology

## 2019-11-13 ENCOUNTER — Other Ambulatory Visit: Payer: Self-pay

## 2019-11-13 VITALS — BP 139/63 | HR 58 | Ht 64.0 in | Wt 207.4 lb

## 2019-11-13 DIAGNOSIS — G4733 Obstructive sleep apnea (adult) (pediatric): Secondary | ICD-10-CM

## 2019-11-13 DIAGNOSIS — I5033 Acute on chronic diastolic (congestive) heart failure: Secondary | ICD-10-CM | POA: Diagnosis not present

## 2019-11-13 DIAGNOSIS — I2510 Atherosclerotic heart disease of native coronary artery without angina pectoris: Secondary | ICD-10-CM

## 2019-11-13 DIAGNOSIS — N184 Chronic kidney disease, stage 4 (severe): Secondary | ICD-10-CM

## 2019-11-13 DIAGNOSIS — I119 Hypertensive heart disease without heart failure: Secondary | ICD-10-CM | POA: Diagnosis not present

## 2019-11-13 DIAGNOSIS — E785 Hyperlipidemia, unspecified: Secondary | ICD-10-CM

## 2019-11-13 DIAGNOSIS — I10 Essential (primary) hypertension: Secondary | ICD-10-CM

## 2019-11-13 NOTE — Progress Notes (Signed)
Cardiology Office Note:    Date:  11/13/2019   ID:  [name], DOB 26-Oct-1960, MRN [id number]  PCP:  Patient, No Pcp Per  Cardiologist:  Quay Burow, MD  Electrophysiologist:  None   Referring MD: No ref. provider found   No chief complaint on file.   History of Present Illness:    [name] is a pleasant 59 y.o. female with a hx of HTN, sleep apnea, PKD and CRI, COPD on inhalers PRN, and moderate coronary disease by catheterization in 2010, 2013, in 2014. Her last angiogram showed a 60% LAD and 50% RCA and 70% distal RCA. Myoview done 2013 showed no significant ischemia with an normal ejection fraction.   I saw her in the office 10/18/2019.  She complained of right lower extremity edema.  She had actually had a venous Doppler of her left lower extremity previously by her PCP which was negative for DVT.  At that time she denied any orthopnea or increased dyspnea on exertion.  Her blood pressure was elevated.  I ordered a Doppler of her right lower extremity which was negative for DVT.  I suggested she change her medications around, I asked her to take her amlodipine and Imdur in the morning, she was previously taking them in the evening.  Labs were drawn that day, her BNP was 162, her BUN 48 and her creatinine had risen to 4.35.  She had been followed by a nephrologist.  I called her and encouraged her to get in touch with her nephrologist soon as possible for follow-up. Echo done 10/22/021 showed her EF to be 60-65% with moderate LVH.   Patient presents today for further follow-up.  She is seeing Dr. Carolin Sicks.  Dialysis has been discussed.  She will follow up with him in December.  He increased her Imdur to 60 mg twice daily.  The patient's lower extremity edema has resolved and she is actually lost 9 pounds since I saw her in the office.  After our discussion at our last office visit she has cut way back on her sodium intake and has stopped drinking soft drinks.  Past Medical  History:  Diagnosis Date  . Angina   . CAD (coronary artery disease), non obstructive CAD in 2010    a. 2013 Cath: LAD 60, RCA 64m, 70d; b. 2014 Cath: stable anatomy.  . Chronic kidney disease    polycystic kidney disease , elevated creatine  . COPD (chronic obstructive pulmonary disease) ([place])   . Diabetes mellitus without complication ([place])    a. 12/2014 HbA1c 6.5; b. Rx Glipizide - not taking.  . Family history of premature CAD   . Hx of adenomatous colonic polyps 06/11/2016  . Hyperlipidemia   . Hypertension   . Hypertensive heart disease   . Palpitations   . Reflux   . Sleep apnea    mild, no c-pap at this point  . Tobacco abuse     Past Surgical History:  Procedure Laterality Date  . APPENDECTOMY  1974  . CARDIAC CATHETERIZATION  02/07/2008   Recommendation-F/U stress test  . CARDIAC CATHETERIZATION  04/16/2011   Recommendation-Increase medical therapy trial  . CARDIOVASCULAR STRESS TEST  03/25/2011   Suspect subtle anterior septal ischemia  . COLONOSCOPY    . FRACTURE SURGERY Left 1972   forearm fracture  . LEFT HEART CATHETERIZATION WITH CORONARY ANGIOGRAM N/A 04/16/2011   Procedure: LEFT HEART CATHETERIZATION WITH CORONARY ANGIOGRAM;  Surgeon: Troy Sine, MD;  Location: [facility] CATH LAB;  Service: Cardiovascular;  Laterality: N/A;  . LEFT HEART CATHETERIZATION WITH CORONARY ANGIOGRAM N/A 09/01/2012   Procedure: LEFT HEART CATHETERIZATION WITH CORONARY ANGIOGRAM;  Surgeon: Pixie Casino, MD;  Location: [facility] CATH LAB;  Service: Cardiovascular;  Laterality: N/A;  . POLYPECTOMY    . RENAL DOPPLER  06/05/2009   No evidence of significant diameter reduction  . TRANSTHORACIC ECHOCARDIOGRAM  01/25/2008   EF 60-65%, Moderate concentric LVH  . WRIST FRACTURE SURGERY Right ~ 1996    Current Medications: Current Meds  Medication Sig  . albuterol (PROVENTIL HFA;VENTOLIN HFA) 108 (90 Base) MCG/ACT inhaler Inhale 2 puffs into the lungs every 4 (four) hours as needed.  Marland Kitchen amLODipine  (NORVASC) 10 MG tablet Take 1 tablet (10 mg total) by mouth daily.  Marland Kitchen aspirin 81 MG tablet Take 81 mg by mouth at bedtime.  . carvedilol (COREG) 25 MG tablet TAKE 1 TABLET (25 MG TOTAL) BY MOUTH 2 (TWO) TIMES DAILY WITH A MEAL. TO REPLACE METOPROLOL  . cloNIDine (CATAPRES) 0.2 MG tablet Take 1 tablet (0.2 mg total) by mouth 2 (two) times daily.  . Evolocumab (REPATHA SURECLICK) 295 MG/ML SOAJ Inject 140 mg into the skin every 14 (fourteen) days.  . hydrALAZINE (APRESOLINE) 100 MG tablet Take 1 tablet (100 mg total) by mouth 3 (three) times daily.  . isosorbide mononitrate (IMDUR) 60 MG 24 hr tablet Take 60 mg by mouth daily.  Loma Boston Calcium 500 MG TABS Take 1 tablet by mouth 2 (two) times daily.     Allergies:   Bupropion, Doxazosin mesylate, Rosuvastatin, Simvastatin, Wellbutrin [bupropion hcl], and Zetia [ezetimibe]   Social History   Socioeconomic History  . Marital status: Divorced    Spouse name: Not on file  . Number of children: 1  . Years of education: Not on file  . Highest education level: Not on file  Occupational History  . Occupation: Lobbyist: [employer]  Tobacco Use  . Smoking status: Former Smoker    Packs/day: 0.25    Years: 30.00    Pack years: 7.50    Types: Cigarettes  . Smokeless tobacco: Never Used  . Tobacco comment: 2-  weeks  Vaping Use  . Vaping Use: Never used  Substance and Sexual Activity  . Alcohol use: No    Alcohol/week: 0.0 standard drinks  . Drug use: No  . Sexual activity: Not Currently    Partners: Male  Other Topics Concern  . Not on file  Social History Narrative  . Not on file   Social Determinants of Health   Financial Resource Strain:   . Difficulty of Paying Living Expenses: Not on file  Food Insecurity:   . Worried About Charity fundraiser in the Last Year: Not on file  . Ran Out of Food in the Last Year: Not on file  Transportation Needs:   . Lack of Transportation (Medical): Not on file  . Lack  of Transportation (Non-Medical): Not on file  Physical Activity:   . Days of Exercise per Week: Not on file  . Minutes of Exercise per Session: Not on file  Stress:   . Feeling of Stress : Not on file  Social Connections:   . Frequency of Communication with Friends and Family: Not on file  . Frequency of Social Gatherings with Friends and Family: Not on file  . Attends Religious Services: Not on file  . Active Member of Clubs or Organizations: Not on file  . Attends  Club or Organization Meetings: Not on file  . Marital Status: Not on file     Family History: The patient's family history includes Cancer in her maternal grandmother; Coronary artery disease in her brother; Diabetes in her mother; Heart attack in her maternal grandfather; Heart attack (age of onset: 43) in her maternal aunt; Hypertension in her father and maternal grandfather; Stroke in her father and maternal grandmother. There is no history of Colon cancer, Esophageal cancer, Stomach cancer, or Rectal cancer.  ROS:   Please see the history of present illness.     All other systems reviewed and are negative.  EKGs/Labs/Other Studies Reviewed:    The following studies were reviewed today:  Echo 11/02/2019- MPRESSIONS    1. Left ventricular ejection fraction, by estimation, is 60 to 65%. The  left ventricle has normal function. The left ventricle has no regional  wall motion abnormalities. There is moderate left ventricular hypertrophy.  Left ventricular diastolic  parameters are consistent with Grade I diastolic dysfunction (impaired  relaxation).  2. Right ventricular systolic function is normal. The right ventricular  size is normal.  3. The mitral valve is normal in structure. Trivial mitral valve  regurgitation. No evidence of mitral stenosis.  4. The aortic valve is normal in structure. Aortic valve regurgitation is  not visualized. No aortic stenosis is present.  5. The inferior vena cava is normal in  size with greater than 50%  respiratory variability, suggesting right atrial pressure of 3 mmHg.   EKG:  EKG is not ordered today.  The ekg ordered 09/14/2019 demonstrates NSR, HR 65, lateral TWI  Recent Labs: 10/18/2019: BNP 162.6; BUN 48; Creatinine, Ser 4.35; Potassium 4.1; Sodium 143  Recent Lipid Panel    Component Value Date/Time   CHOL 183 10/05/2018 0923   TRIG 251 (H) 10/05/2018 0923   HDL 29 (L) 10/05/2018 0923   CHOLHDL 6.3 (H) 10/05/2018 0923   CHOLHDL 7.0 (H) 04/01/2016 0847   VLDL 52 (H) 04/01/2016 0847   LDLCALC 110 (H) 10/05/2018 0923    Physical Exam:    VS:  BP 139/63   Pulse (!) 58   Ht 5\' 4"  (1.626 m)   Wt 207 lb 6.4 oz (94.1 kg)   LMP 12/14/2013   SpO2 94%   BMI 35.60 kg/m     Wt Readings from Last 3 Encounters:  11/13/19 207 lb 6.4 oz (94.1 kg)  10/18/19 226 lb 9.6 oz (102.8 kg)  09/14/19 221 lb (100.2 kg)     GEN: Overweight caucasian female, well developed in no acute distress HEENT: Normal NECK: No JVD; No carotid bruits CARDIAC: RRR, no murmurs, rubs, gallops RESPIRATORY:  Clear to auscultation without rales, wheezing or rhonchi  ABDOMEN: Soft, obese, non-distended MUSCULOSKELETAL:  No edema; No deformity  SKIN: Warm and dry NEUROLOGIC:  Alert and oriented x 3 PSYCHIATRIC:  Normal affect   ASSESSMENT:    Acute on chronic diastolic CHF It appears her LE edema was from diastolic CHF secondary to dietary indiscretion and uncontrolled HTN.  She is down 9 lbs just with adjustment in her diet.   Hypertensive heart disease Moderate LVH with normal LVF by echo 11/02/2019 -no change from 20210.  B/P controlled  CRI (chronic renal insufficiency), stage 4 (moderate) (HCC) Followed by Dr Carolin Sicks- GFR 11.   CAD (coronary artery disease) Cath 2010, 2013, and Aug 2014 after abnormal Myoview- moderate disease- 60% mRCA, no significant LAD or CFX disease  Sleep apnea She tells me she can't afford  C-pap. I encouraged her to keep her f/u with Dr  Claiborne Billings as scheduled.   Truncal obesity BMI 38 PLAN:    Same cardiac Rx- f/u Dr Claiborne Billings for sleep apnea, Dr Gwenlyn Found in 6 months.   Medication Adjustments/Labs and Tests Ordered: Current medicines are reviewed at length with the patient today.  Concerns regarding medicines are outlined above.  No orders of the defined types were placed in this encounter.  No orders of the defined types were placed in this encounter.   Patient Instructions  Medication Instructions:  Continue current medications  *If you need a refill on your cardiac medications before your next appointment, please call your pharmacy*   Lab Work: None Ordered   Testing/Procedures: None Ordered   Follow-Up: At [facility], you and your health needs are our priority.  As part of our continuing mission to provide you with exceptional heart care, we have created designated Provider Care Teams.  These Care Teams include your primary Cardiologist (physician) and Advanced Practice Providers (APPs -  Physician Assistants and Nurse Practitioners) who all work together to provide you with the care you need, when you need it.  We recommend signing up for the patient portal called "MyChart".  Sign up information is provided on this After Visit Summary.  MyChart is used to connect with patients for Virtual Visits (Telemedicine).  Patients are able to view lab/test results, encounter notes, upcoming appointments, etc.  Non-urgent messages can be sent to your provider as well.   To learn more about what you can do with MyChart, go to [email or web address].    Your next appointment:   6 month(s)  The format for your next appointment:   In Person  Provider:   You may see Quay Burow, MD or one of the following Advanced Practice Providers on your designated Care Team:    Kerin Ransom, PA-C  [place], [place]  Coletta Memos, FNP        Signed, Kerin Ransom, [place]  11/13/2019 11:01 AM    [place]

## 2019-11-13 NOTE — Patient Instructions (Signed)

## 2019-11-28 ENCOUNTER — Ambulatory Visit: Payer: Managed Care, Other (non HMO) | Admitting: Cardiovascular Disease

## 2019-11-29 DIAGNOSIS — N281 Cyst of kidney, acquired: Secondary | ICD-10-CM | POA: Insufficient documentation

## 2019-12-03 ENCOUNTER — Telehealth: Payer: Self-pay

## 2019-12-03 NOTE — Telephone Encounter (Signed)
Received a fax from [employer] stating they have made several attempts to contact pt to schedule order for PAP setup and supplies.   Chart noted for record.

## 2019-12-05 ENCOUNTER — Encounter: Payer: Self-pay | Admitting: Family Medicine

## 2019-12-05 ENCOUNTER — Ambulatory Visit
Admission: EM | Admit: 2019-12-05 | Discharge: 2019-12-05 | Disposition: A | Discharge status: Home / Self Care | Payer: Managed Care, Other (non HMO) | Attending: Family Medicine | Admitting: Family Medicine

## 2019-12-05 ENCOUNTER — Other Ambulatory Visit: Payer: Self-pay

## 2019-12-05 DIAGNOSIS — R1310 Dysphagia, unspecified: Secondary | ICD-10-CM | POA: Diagnosis not present

## 2019-12-05 DIAGNOSIS — K030 Excessive attrition of teeth: Secondary | ICD-10-CM | POA: Diagnosis not present

## 2019-12-05 DIAGNOSIS — R590 Localized enlarged lymph nodes: Secondary | ICD-10-CM | POA: Diagnosis not present

## 2019-12-05 MED ORDER — AMOXICILLIN 875 MG PO TABS: 875.0000 mg | ORAL_TABLET | Freq: Two times a day (BID) | ORAL | 0 refills | Status: DC

## 2019-12-05 NOTE — ED Triage Notes (Signed)
Pt c/o sore throat for over a week, stopped for a few days and now back. States feels like a lump in her throat. No distress noted.

## 2019-12-05 NOTE — ED Provider Notes (Signed)
[name]    CSN: [id number] Arrival date & time: 12/05/19  1521      History   Chief Complaint Chief Complaint  Patient presents with  . Sore Throat    HPI [name] is a 59 y.o. female.   59 yo established [facility] patient with sore throat which feels like a lump at the base of her tongue for the last week.  She has poor dental hygiene and notes a left submandibular swollen gland.  No fever, reflux symptoms.     Past Medical History:  Diagnosis Date  . Angina   . CAD (coronary artery disease), non obstructive CAD in 2010    a. 2013 Cath: LAD 60, RCA 80m, 70d; b. 2014 Cath: stable anatomy.  . Chronic kidney disease    polycystic kidney disease , elevated creatine  . COPD (chronic obstructive pulmonary disease) ([place])   . Diabetes mellitus without complication ([place])    a. 12/2014 HbA1c 6.5; b. Rx Glipizide - not taking.  . Family history of premature CAD   . Hx of adenomatous colonic polyps 06/11/2016  . Hyperlipidemia   . Hypertension   . Hypertensive heart disease   . Palpitations   . Reflux   . Sleep apnea    mild, no c-pap at this point  . Tobacco abuse     Patient Active Problem List   Diagnosis Date Noted  . Acute on chronic diastolic heart failure ([place]) 11/13/2019  . Edema 10/18/2019  . Truncal obesity 10/18/2019  . 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 Hx of adenomatous colonic polyps 06/11/2016  . Diverticulosis 06/07/2016  . Sleep apnea 03/18/2016  . CRI (chronic renal insufficiency), stage 4 (severe) ([place]) 03/18/2016  . Increased risk of breast cancer 12/17/2015  . Hypertensive heart disease   . Hematoma of groin - right, s/p cath; stable on discharge 09/02/2012  . Pleuritic chest pain 08/31/2012  . Obesity (BMI 30-39.9) 08/31/2012  . CAD (coronary artery disease) 04/15/2011  . Abnormal stress test, Lexiscan myoview 03/25/2011-sm. anterior ischemia 04/15/2011  . Essential hypertension 04/15/2011    . Dyslipidemia- LDL goal < 70 04/15/2011  . Metabolic syndrome 90/24/0973  . Tobacco abuse 04/15/2011  . Family history of premature CAD 04/15/2011    Past Surgical History:  Procedure Laterality Date  . APPENDECTOMY  1974  . CARDIAC CATHETERIZATION  02/07/2008   Recommendation-F/U stress test  . CARDIAC CATHETERIZATION  04/16/2011   Recommendation-Increase medical therapy trial  . CARDIOVASCULAR STRESS TEST  03/25/2011   Suspect subtle anterior septal ischemia  . COLONOSCOPY    . FRACTURE SURGERY Left 1972   forearm fracture  . LEFT HEART CATHETERIZATION WITH CORONARY ANGIOGRAM N/A 04/16/2011   Procedure: LEFT HEART CATHETERIZATION WITH CORONARY ANGIOGRAM;  Surgeon: Troy Sine, MD;  Location: [facility] CATH LAB;  Service: Cardiovascular;  Laterality: N/A;  . LEFT HEART CATHETERIZATION WITH CORONARY ANGIOGRAM N/A 09/01/2012   Procedure: LEFT HEART CATHETERIZATION WITH CORONARY ANGIOGRAM;  Surgeon: Pixie Casino, MD;  Location: [facility] CATH LAB;  Service: Cardiovascular;  Laterality: N/A;  . POLYPECTOMY    . RENAL DOPPLER  06/05/2009   No evidence of significant diameter reduction  . TRANSTHORACIC ECHOCARDIOGRAM  01/25/2008   EF 60-65%, Moderate concentric LVH  . WRIST FRACTURE SURGERY Right ~ 1996    OB History   No obstetric history on file.      Home Medications    Prior to Admission medications   Medication Sig Start Date End  Date Taking? Authorizing Provider  albuterol (PROVENTIL HFA;VENTOLIN HFA) 108 (90 Base) MCG/ACT inhaler Inhale 2 puffs into the lungs every 4 (four) hours as needed. 08/23/17   Jaynee Eagles, PA-C  amLODipine (NORVASC) 10 MG tablet Take 1 tablet (10 mg total) by mouth daily. 09/14/19   Lorretta Harp, MD  amoxicillin (AMOXIL) 875 MG tablet Take 1 tablet (875 mg total) by mouth 2 (two) times daily. Take with food 12/05/19   Robyn Haber, MD  aspirin 81 MG tablet Take 81 mg by mouth at bedtime.    [provider]  carvedilol (COREG) 25 MG tablet TAKE  1 TABLET (25 MG TOTAL) BY MOUTH 2 (TWO) TIMES DAILY WITH A MEAL. TO REPLACE METOPROLOL 04/20/19   Lorretta Harp, MD  cloNIDine (CATAPRES) 0.2 MG tablet Take 1 tablet (0.2 mg total) by mouth 2 (two) times daily. 09/12/19   Troy Sine, MD  Evolocumab (REPATHA SURECLICK) 226 MG/ML SOAJ Inject 140 mg into the skin every 14 (fourteen) days. 10/04/19   Lorretta Harp, MD  hydrALAZINE (APRESOLINE) 100 MG tablet Take 1 tablet (100 mg total) by mouth 3 (three) times daily. 09/14/19   Lorretta Harp, MD  isosorbide mononitrate (IMDUR) 60 MG 24 hr tablet Take 60 mg by mouth daily.    [provider]  Loma Boston Calcium 500 MG TABS Take 1 tablet by mouth 2 (two) times daily. 04/18/19   [provider]    Family History Family History  Problem Relation Age of Onset  . Stroke Father   . Hypertension Father   . Coronary artery disease Brother   . Diabetes Mother   . Stroke Maternal Grandmother   . Cancer Maternal Grandmother        Breast cancer  . Hypertension Maternal Grandfather   . Heart attack Maternal Grandfather   . Heart attack Maternal Aunt 41  . Colon cancer Neg Hx   . Esophageal cancer Neg Hx   . Stomach cancer Neg Hx   . Rectal cancer Neg Hx     Social History Social History   Tobacco Use  . Smoking status: Former Smoker    Packs/day: 0.25    Years: 30.00    Pack years: 7.50    Types: Cigarettes  . Smokeless tobacco: Never Used  . Tobacco comment: 2-  weeks  Vaping Use  . Vaping Use: Never used  Substance Use Topics  . Alcohol use: No    Alcohol/week: 0.0 standard drinks  . Drug use: No     Allergies   Bupropion, Doxazosin mesylate, Rosuvastatin, Simvastatin, Wellbutrin [bupropion hcl], and Zetia [ezetimibe]   Review of Systems Review of Systems  Constitutional: Negative.   HENT: Positive for dental problem.      Physical Exam Triage Vital Signs ED Triage Vitals  Enc Vitals Group     BP      Pulse      Resp      Temp      Temp  src      SpO2      Weight      Height      Head Circumference      Peak Flow      Pain Score      Pain Loc      Pain Edu?      Excl. in [place]?    No data found.  Updated Vital Signs BP 130/71 (BP Location: Left Arm)   Pulse 60   Temp Marland Kitchen)  97.4 F (36.3 C) (Oral)   Resp 20   LMP 12/14/2013   SpO2 93%    Physical Exam Vitals and nursing note reviewed.  Constitutional:      General: She is not in acute distress.    Appearance: She is well-developed. She is obese. She is not ill-appearing.  HENT:     Head: Normocephalic.     Mouth/Throat:     Mouth: Mucous membranes are moist.     Pharynx: Oropharynx is clear.     Comments: Loss of molars with just roots showing  Throat clear  1 cm left submandibular node  No thyromegaly or other cervical lymphadenopathy Neurological:     Mental Status: She is alert.      UC Treatments / Results  Labs (all labs ordered are listed, but only abnormal results are displayed) Labs Reviewed - No data to display  EKG   Radiology No results found.  Procedures Procedures (including critical care time)  Medications Ordered in UC Medications - No data to display  Initial Impression / Assessment and Plan / UC Course  I have reviewed the triage vital signs and the nursing notes.  Pertinent labs & imaging results that were available during my care of the patient were reviewed by me and considered in my medical decision making (see chart for details).    Final Clinical Impressions(s) / UC Diagnoses   Final diagnoses:  Cervical lymphadenopathy  Dysphagia, unspecified type  Dental attrition, excessive   Discharge Instructions   None    ED Prescriptions    Medication Sig Dispense Auth. Provider   amoxicillin (AMOXIL) 875 MG tablet Take 1 tablet (875 mg total) by mouth 2 (two) times daily. Take with food 20 tablet Robyn Haber, MD     I have reviewed the PDMP during this encounter.   Robyn Haber, MD 12/05/19  [phone]

## 2019-12-08 IMAGING — US US RENAL
1 series · 14 of 25 positions shown · non-contrast
Comparison: 12/18/2014

CLINICAL DATA: Chronic renal disease, follow-up renal cysts

EXAM:
RENAL / URINARY TRACT ULTRASOUND COMPLETE

[Series 1: us renal · 0.25mm/px · 14 of 60 slices shown]
[im 1/60]
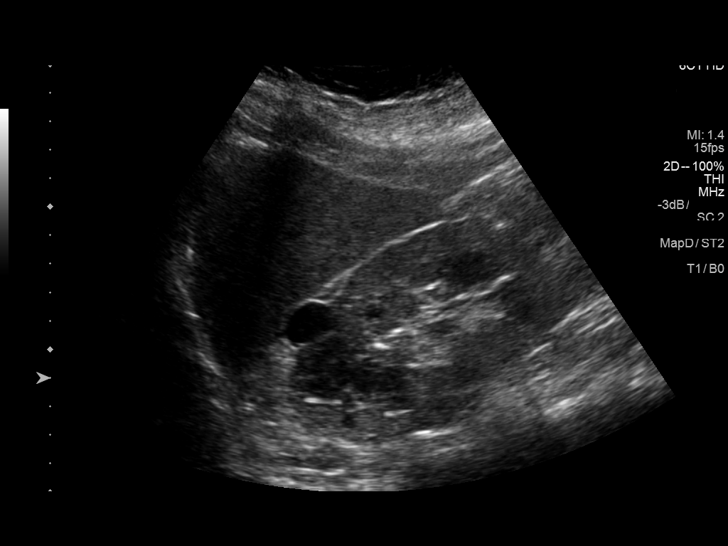
[im 5/60]
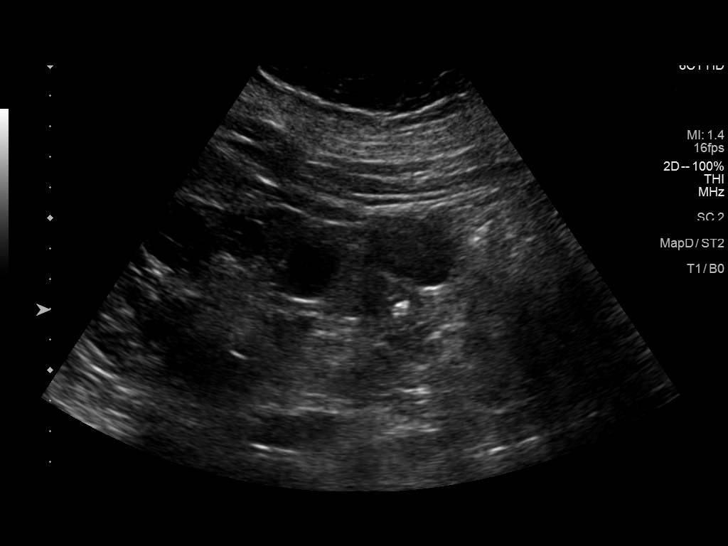
[im 10/60]
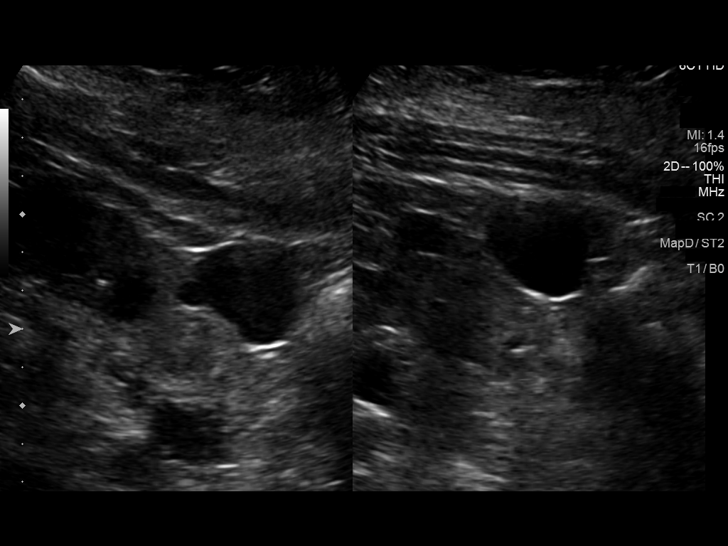
[im 15/60]
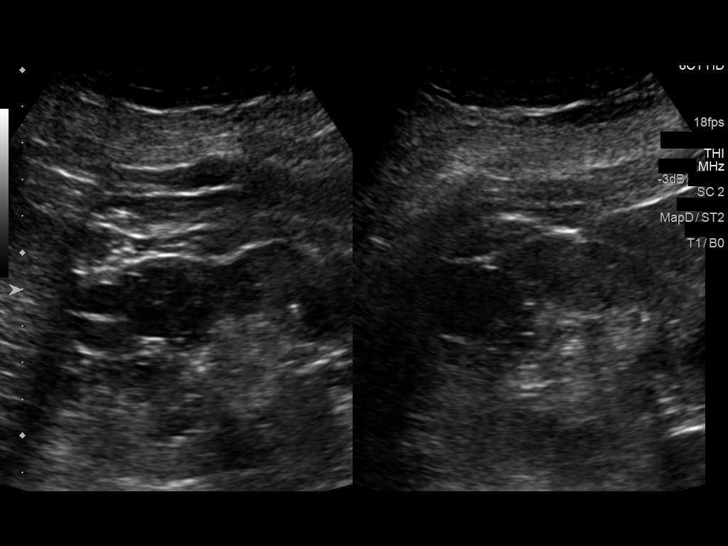
[im 20/60]
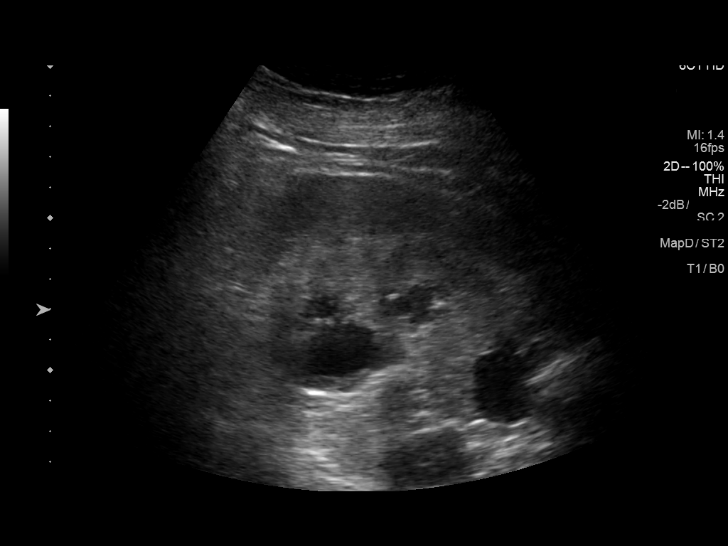
[im 23/60]
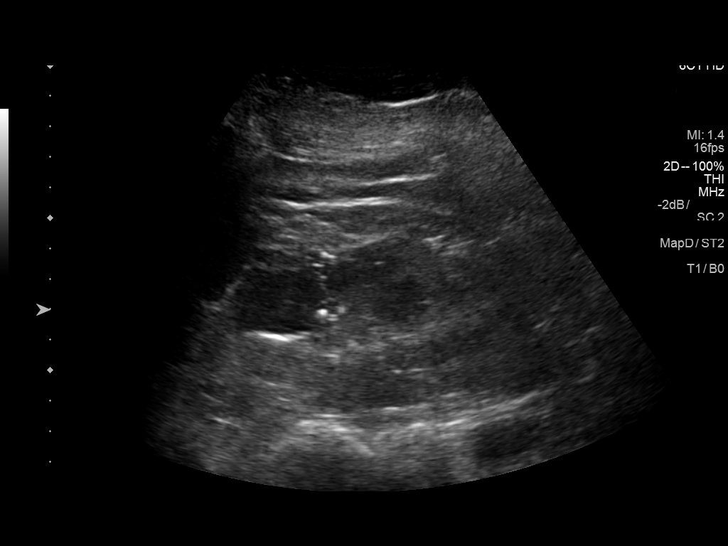
[im 28/60]
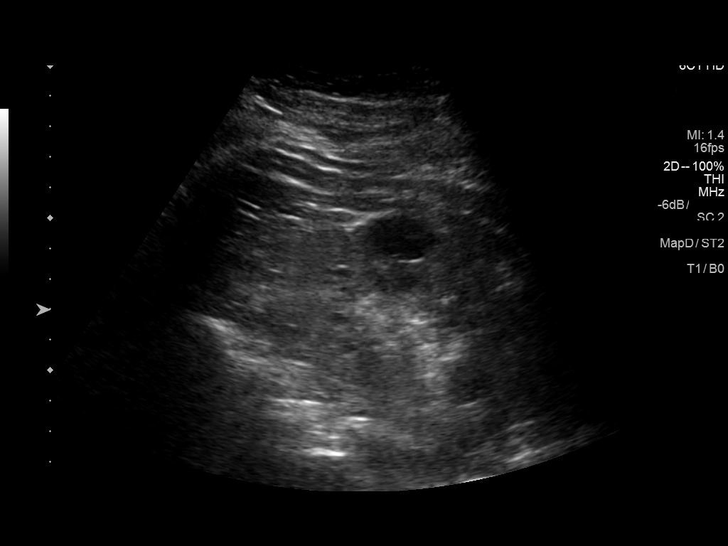
[im 32/60]
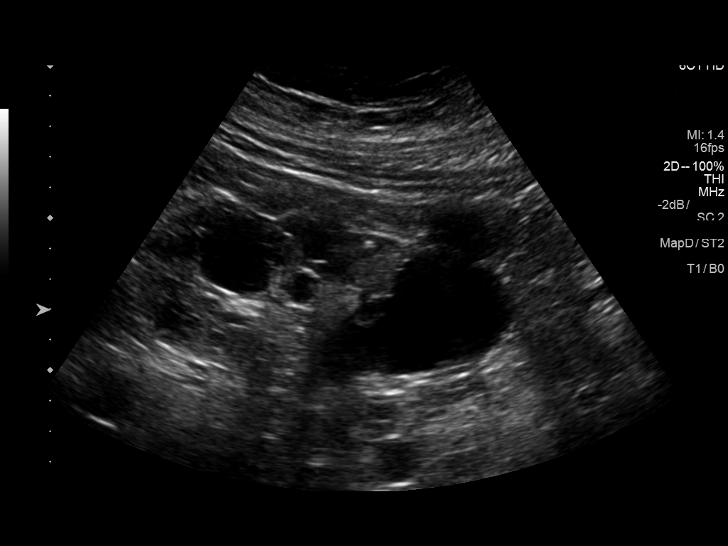
[im 37/60]
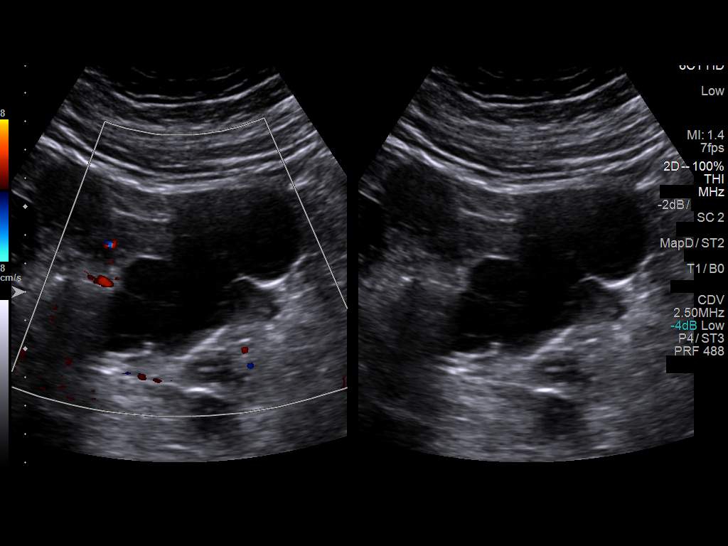
[im 40/60]
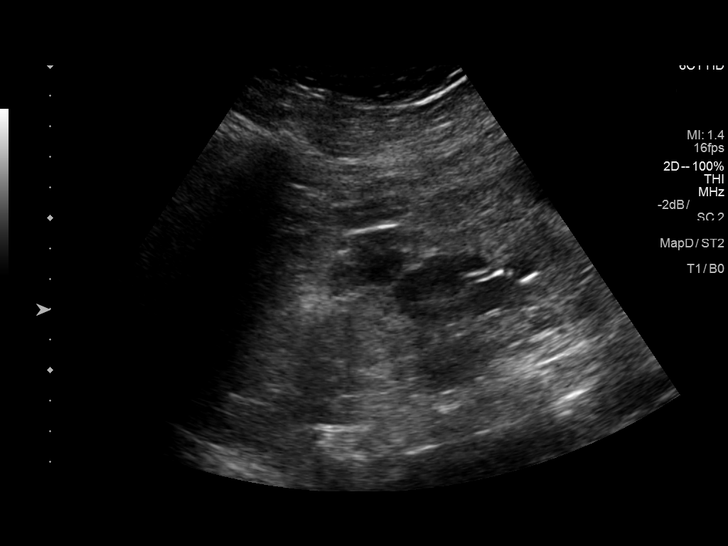
[im 45/60]
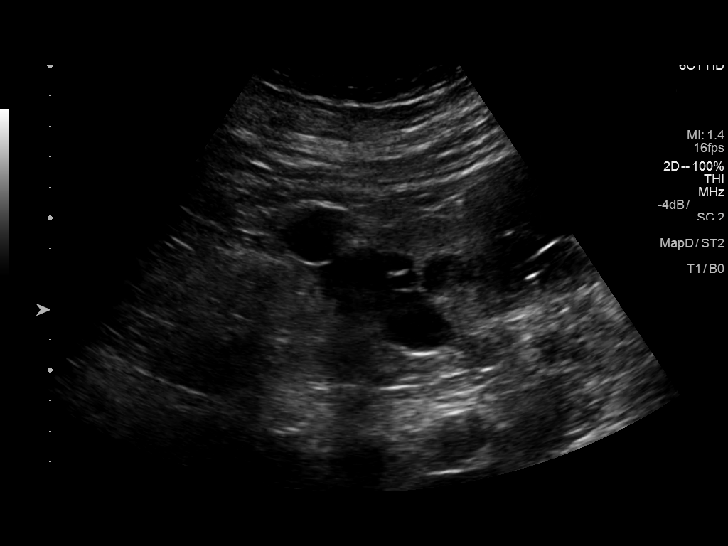
[im 50/60]
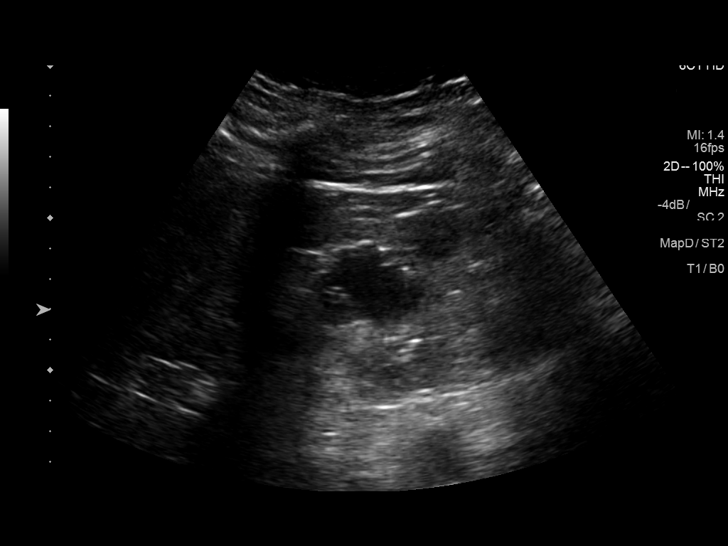
[im 55/60]
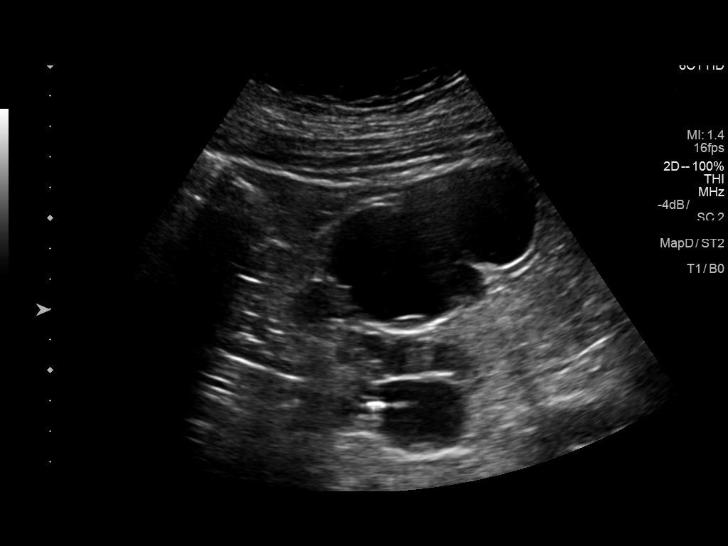
[im 60/60]
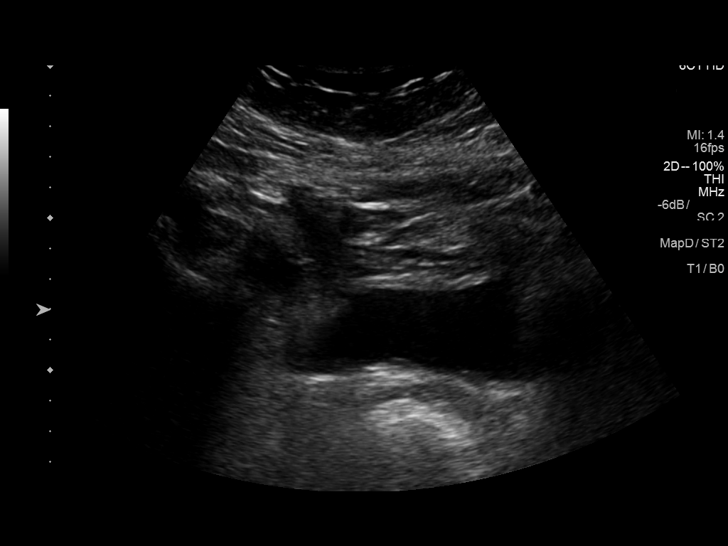

[14 of 25 positions shown; findings below may reference images not displayed]

FINDINGS: Right Kidney:

Length: 12.5 cm. No obstructive changes are noted. Multiple cysts
are seen. The largest of these measures 4.1 cm in the upper pole. 6
mm echogenic focus in the lower pole is noted likely representing a
nonobstructing stone.

Left Kidney:

Length: 13.2 cm. Multiple cysts are noted in the left kidney. The
largest of these lies in the lower pole measuring 8.9 cm. This
likely represents a conglomeration of 2 adjacent cysts or a bilobed
cyst. No definitive calculi or obstructive changes are noted.

Bladder:

Decompressed
IMPRESSION: Bilateral renal cysts.

Nonobstructing right renal stone.

## 2019-12-19 ENCOUNTER — Other Ambulatory Visit: Payer: Self-pay

## 2019-12-19 DIAGNOSIS — N184 Chronic kidney disease, stage 4 (severe): Secondary | ICD-10-CM

## 2019-12-27 ENCOUNTER — Other Ambulatory Visit: Payer: Self-pay | Admitting: Cardiovascular Disease

## 2019-12-28 ENCOUNTER — Other Ambulatory Visit: Payer: Self-pay

## 2019-12-28 ENCOUNTER — Ambulatory Visit (INDEPENDENT_AMBULATORY_CARE_PROVIDER_SITE_OTHER)
Admission: RE | Admit: 2019-12-28 | Discharge: 2019-12-28 | Disposition: A | Discharge status: Home / Self Care | Payer: Managed Care, Other (non HMO) | Source: Ambulatory Visit | Attending: Vascular Surgery | Admitting: Vascular Surgery

## 2019-12-28 ENCOUNTER — Ambulatory Visit (INDEPENDENT_AMBULATORY_CARE_PROVIDER_SITE_OTHER): Payer: Managed Care, Other (non HMO) | Admitting: Vascular Surgery

## 2019-12-28 ENCOUNTER — Encounter: Payer: Self-pay | Admitting: Vascular Surgery

## 2019-12-28 ENCOUNTER — Ambulatory Visit (HOSPITAL_COMMUNITY)
Admission: RE | Admit: 2019-12-28 | Discharge: 2019-12-28 | Disposition: A | Discharge status: Home / Self Care | Payer: Managed Care, Other (non HMO) | Source: Ambulatory Visit | Attending: Vascular Surgery | Admitting: Vascular Surgery

## 2019-12-28 VITALS — BP 162/80 | HR 58 | Temp 98.3°F | Resp 20 | Ht 64.0 in | Wt 200.0 lb

## 2019-12-28 DIAGNOSIS — N184 Chronic kidney disease, stage 4 (severe): Secondary | ICD-10-CM

## 2019-12-28 NOTE — Progress Notes (Signed)
Patient ID: [name], female   DOB: [birth year], 59 y.o.   MRN: [id number]  Reason for Consult: New Patient (Initial Visit) (ESRD)   Referred by Rosita Fire, *  Subjective:     HPI:  [name] is a 59 y.o. female with chronic kidney disease.  She has a history of diabetes, hypertension, hyperlipidemia.  She denies any previous vascular disease does have known coronary artery disease.  She is right-hand dominant.  She denies any history of left upper extremity or chest or breast surgery.  She has no previous pacemakers.  She has never been on dialysis nor has she had family on dialysis  Past Medical History:  Diagnosis Date  . Angina   . CAD (coronary artery disease), non obstructive CAD in 2010    a. 2013 Cath: LAD 60, RCA 70m, 70d; b. 2014 Cath: stable anatomy.  . Chronic kidney disease    polycystic kidney disease , elevated creatine  . COPD (chronic obstructive pulmonary disease) ([place])   . Diabetes mellitus without complication ([place])    a. 12/2014 HbA1c 6.5; b. Rx Glipizide - not taking.  . Family history of premature CAD   . Hx of adenomatous colonic polyps 06/11/2016  . Hyperlipidemia   . Hypertension   . Hypertensive heart disease   . Palpitations   . Reflux   . Sleep apnea    mild, no c-pap at this point  . Tobacco abuse    Family History  Problem Relation Age of Onset  . Stroke Father   . Hypertension Father   . Coronary artery disease Brother   . Diabetes Mother   . Stroke Maternal Grandmother   . Cancer Maternal Grandmother        Breast cancer  . Hypertension Maternal Grandfather   . Heart attack Maternal Grandfather   . Heart attack Maternal Aunt 41  . Colon cancer Neg Hx   . Esophageal cancer Neg Hx   . Stomach cancer Neg Hx   . Rectal cancer Neg Hx    Past Surgical History:  Procedure Laterality Date  . APPENDECTOMY  1974  . CARDIAC CATHETERIZATION  02/07/2008   Recommendation-F/U stress test  . CARDIAC CATHETERIZATION   04/16/2011   Recommendation-Increase medical therapy trial  . CARDIOVASCULAR STRESS TEST  03/25/2011   Suspect subtle anterior septal ischemia  . COLONOSCOPY    . FRACTURE SURGERY Left 1972   forearm fracture  . LEFT HEART CATHETERIZATION WITH CORONARY ANGIOGRAM N/A 04/16/2011   Procedure: LEFT HEART CATHETERIZATION WITH CORONARY ANGIOGRAM;  Surgeon: Troy Sine, MD;  Location: [facility] CATH LAB;  Service: Cardiovascular;  Laterality: N/A;  . LEFT HEART CATHETERIZATION WITH CORONARY ANGIOGRAM N/A 09/01/2012   Procedure: LEFT HEART CATHETERIZATION WITH CORONARY ANGIOGRAM;  Surgeon: Pixie Casino, MD;  Location: [facility] CATH LAB;  Service: Cardiovascular;  Laterality: N/A;  . POLYPECTOMY    . RENAL DOPPLER  06/05/2009   No evidence of significant diameter reduction  . TRANSTHORACIC ECHOCARDIOGRAM  01/25/2008   EF 60-65%, Moderate concentric LVH  . WRIST FRACTURE SURGERY Right ~ 68    Short Social History:  Social History   Tobacco Use  . Smoking status: Former Smoker    Packs/day: 0.25    Years: 30.00    Pack years: 7.50    Types: Cigarettes  . Smokeless tobacco: Never Used  . Tobacco comment: 2-  weeks  Substance Use Topics  . Alcohol use: No    Alcohol/week: 0.0 standard drinks  Allergies  Allergen Reactions  . Bupropion     Other reaction(s): Other Suicidal thoughts and increased depression  . Doxazosin Mesylate     Increased heart rate  . Rosuvastatin Other (See Comments)    myalgia  . Simvastatin     myalgias  . Wellbutrin [Bupropion Hcl]     unknown  . Zetia [Ezetimibe]     myalgias    Current Outpatient Medications  Medication Sig Dispense Refill  . albuterol (PROVENTIL HFA;VENTOLIN HFA) 108 (90 Base) MCG/ACT inhaler Inhale 2 puffs into the lungs every 4 (four) hours as needed. 1 Inhaler 1  . amLODipine (NORVASC) 10 MG tablet Take 1 tablet (10 mg total) by mouth daily. 90 tablet 3  . aspirin 81 MG tablet Take 81 mg by mouth at bedtime.    . carvedilol (COREG) 25  MG tablet TAKE 1 TABLET (25 MG TOTAL) BY MOUTH 2 (TWO) TIMES DAILY WITH A MEAL. TO REPLACE METOPROLOL 180 tablet 1  . cloNIDine (CATAPRES) 0.2 MG tablet TAKE 1 TABLET (0.2 MG TOTAL) BY MOUTH 2 (TWO) TIMES DAILY. 180 tablet 1  . Evolocumab (REPATHA SURECLICK) 595 MG/ML SOAJ Inject 140 mg into the skin every 14 (fourteen) days. 2 mL 11  . hydrALAZINE (APRESOLINE) 100 MG tablet Take 1 tablet (100 mg total) by mouth 3 (three) times daily. 270 tablet 3  . isosorbide mononitrate (IMDUR) 60 MG 24 hr tablet Take 60 mg by mouth daily.    L[place] Calcium 500 MG TABS Take 1 tablet by mouth 2 (two) times daily.     No current facility-administered medications for this visit.    Review of Systems  Constitutional:  Constitutional negative. HENT: HENT negative.  Eyes: Eyes negative.  Respiratory: Respiratory negative.  Cardiovascular: Cardiovascular negative.  GI: Gastrointestinal negative.  Musculoskeletal: Musculoskeletal negative.  Skin: Skin negative.  Neurological: Neurological negative. Hematologic: Hematologic/lymphatic negative.  Psychiatric: Psychiatric negative.        Objective:  Objective   Vitals:   12/28/19 0841  BP: (!) 162/80  Pulse: (!) 58  Resp: 20  Temp: 98.3 F (36.8 C)  SpO2: 95%  Weight: 200 lb (90.7 kg)  Height: 5\' 4"  (1.626 m)   Body mass index is 34.33 kg/m.  Physical Exam HENT:     Head: Normocephalic and atraumatic.     Nose:     Comments: Wearing a mask with a peacock Eyes:     Pupils: Pupils are equal, round, and reactive to light.  Cardiovascular:     Pulses: Normal pulses.  Pulmonary:     Effort: Pulmonary effort is normal.  Abdominal:     General: Abdomen is flat.     Palpations: Abdomen is soft.  Musculoskeletal:        General: Normal range of motion.  Skin:    General: Skin is warm and dry.     Capillary Refill: Capillary refill takes less than 2 seconds.  Neurological:     General: No focal deficit present.     Mental Status:  She is alert.  Psychiatric:        Mood and Affect: Mood normal.        Behavior: Behavior normal.        Thought Content: Thought content normal.        Judgment: Judgment normal.     Data: I have independently interpreted her upper extremity arterial duplex.  Right side brachial artery 2.54 cm and triphasic and less than 0.52 cm and triphasic.  I have independent interpreted her vein mapping appears to have suitable basilic and cephalic veins throughout her bilateral upper extremities.     Assessment/Plan:     59 year old female with chronic kidney disease.  She is right-hand dominant.  She appears to have good cephalic vein all the way to the wrist.  We will plan for left upper extremity likely radiocephalic versus brachiocephalic AV fistula in the near future.  We discussed the risk and benefits versus alternatives and she demonstrates good understanding     Waynetta Sandy MD [facility]

## 2020-01-18 ENCOUNTER — Encounter: Payer: Managed Care, Other (non HMO) | Admitting: Vascular Surgery

## 2020-01-26 DIAGNOSIS — Q613 Polycystic kidney, unspecified: Secondary | ICD-10-CM | POA: Insufficient documentation

## 2020-02-13 ENCOUNTER — Other Ambulatory Visit (HOSPITAL_COMMUNITY): Payer: Managed Care, Other (non HMO)

## 2020-02-15 ENCOUNTER — Encounter (HOSPITAL_COMMUNITY): Payer: Self-pay | Admitting: Vascular Surgery

## 2020-02-15 ENCOUNTER — Other Ambulatory Visit: Payer: Self-pay

## 2020-02-15 NOTE — Progress Notes (Signed)
Ms [name] denies chest pain or shortness of breath. Patient denies having any s/s of Covid in her household.  Patient denies any known exposure to Covid. Ms [name] will be tested for Covid on Monday.  Diabetes is listed on Ms [name]'s medical history, patient said that no one has told her that.  Patient does not check CBG. I read further in history, patient had an A1C in 2016, Glipizide was ordered, but Ms [name] did not start it, the last A1C I found was in 2019,, it was 6.3

## 2020-02-18 ENCOUNTER — Other Ambulatory Visit (HOSPITAL_COMMUNITY)
Admission: RE | Admit: 2020-02-18 | Discharge: 2020-02-18 | Disposition: A | Discharge status: Home / Self Care | Payer: Managed Care, Other (non HMO) | Source: Ambulatory Visit | Attending: Vascular Surgery | Admitting: Vascular Surgery

## 2020-02-18 DIAGNOSIS — Z01812 Encounter for preprocedural laboratory examination: Secondary | ICD-10-CM | POA: Insufficient documentation

## 2020-02-18 DIAGNOSIS — Z20822 Contact with and (suspected) exposure to covid-19: Secondary | ICD-10-CM | POA: Diagnosis not present

## 2020-02-18 LAB — SARS CORONAVIRUS 2 (TAT 6-24 HRS): SARS Coronavirus 2: NEGATIVE

## 2020-02-19 ENCOUNTER — Encounter (HOSPITAL_COMMUNITY): Payer: Self-pay | Admitting: Vascular Surgery

## 2020-02-19 ENCOUNTER — Other Ambulatory Visit: Payer: Self-pay

## 2020-02-19 ENCOUNTER — Encounter (HOSPITAL_COMMUNITY)
Admission: RE | Disposition: A | Discharge status: Home / Self Care | Payer: Self-pay | Source: Home / Self Care | Attending: Vascular Surgery

## 2020-02-19 ENCOUNTER — Ambulatory Visit (HOSPITAL_COMMUNITY): Payer: Managed Care, Other (non HMO) | Admitting: Anesthesiology

## 2020-02-19 ENCOUNTER — Ambulatory Visit (HOSPITAL_COMMUNITY)
Admission: RE | Admit: 2020-02-19 | Discharge: 2020-02-19 | Disposition: A | Discharge status: Home / Self Care | Payer: Managed Care, Other (non HMO) | Attending: Vascular Surgery | Admitting: Vascular Surgery

## 2020-02-19 DIAGNOSIS — Z7982 Long term (current) use of aspirin: Secondary | ICD-10-CM | POA: Diagnosis not present

## 2020-02-19 DIAGNOSIS — Z8249 Family history of ischemic heart disease and other diseases of the circulatory system: Secondary | ICD-10-CM | POA: Diagnosis not present

## 2020-02-19 DIAGNOSIS — Z87891 Personal history of nicotine dependence: Secondary | ICD-10-CM | POA: Diagnosis not present

## 2020-02-19 DIAGNOSIS — Z888 Allergy status to other drugs, medicaments and biological substances status: Secondary | ICD-10-CM | POA: Insufficient documentation

## 2020-02-19 DIAGNOSIS — Z833 Family history of diabetes mellitus: Secondary | ICD-10-CM | POA: Diagnosis not present

## 2020-02-19 DIAGNOSIS — I2510 Atherosclerotic heart disease of native coronary artery without angina pectoris: Secondary | ICD-10-CM | POA: Diagnosis not present

## 2020-02-19 DIAGNOSIS — E1122 Type 2 diabetes mellitus with diabetic chronic kidney disease: Secondary | ICD-10-CM | POA: Insufficient documentation

## 2020-02-19 DIAGNOSIS — N184 Chronic kidney disease, stage 4 (severe): Secondary | ICD-10-CM

## 2020-02-19 DIAGNOSIS — E785 Hyperlipidemia, unspecified: Secondary | ICD-10-CM | POA: Insufficient documentation

## 2020-02-19 DIAGNOSIS — N189 Chronic kidney disease, unspecified: Secondary | ICD-10-CM | POA: Insufficient documentation

## 2020-02-19 DIAGNOSIS — I129 Hypertensive chronic kidney disease with stage 1 through stage 4 chronic kidney disease, or unspecified chronic kidney disease: Secondary | ICD-10-CM | POA: Insufficient documentation

## 2020-02-19 DIAGNOSIS — Z79899 Other long term (current) drug therapy: Secondary | ICD-10-CM | POA: Insufficient documentation

## 2020-02-19 DIAGNOSIS — N185 Chronic kidney disease, stage 5: Secondary | ICD-10-CM

## 2020-02-19 HISTORY — PX: AV FISTULA PLACEMENT: SHX1204

## 2020-02-19 HISTORY — DX: Gastro-esophageal reflux disease without esophagitis: K21.9

## 2020-02-19 LAB — GLUCOSE, CAPILLARY
Glucose-Capillary: 124 mg/dL — ABNORMAL HIGH (ref 70–99)
Glucose-Capillary: 106 mg/dL — ABNORMAL HIGH (ref 70–99)

## 2020-02-19 LAB — POCT I-STAT, CHEM 8
BUN: 48 mg/dL — ABNORMAL HIGH (ref 6–20)
Calcium, Ion: 1.17 mmol/L (ref 1.15–1.40)
Chloride: 104 mmol/L (ref 98–111)
Creatinine, Ser: 4.6 mg/dL — ABNORMAL HIGH (ref 0.44–1.00)
Glucose, Bld: 129 mg/dL — ABNORMAL HIGH (ref 70–99)
HCT: 35 % — ABNORMAL LOW (ref 36.0–46.0)
Hemoglobin: 11.9 g/dL — ABNORMAL LOW (ref 12.0–15.0)
Potassium: 3.8 mmol/L (ref 3.5–5.1)
Sodium: 141 mmol/L (ref 135–145)
TCO2: 23 mmol/L (ref 22–32)

## 2020-02-19 SURGERY — ARTERIOVENOUS (AV) FISTULA CREATION
Anesthesia: Monitor Anesthesia Care | Laterality: Left

## 2020-02-19 MED ORDER — PROPOFOL 1000 MG/100ML IV EMUL
INTRAVENOUS | Status: AC
  Filled 2020-02-19: qty 100

## 2020-02-19 MED ORDER — FENTANYL CITRATE (PF) 250 MCG/5ML IJ SOLN
INTRAMUSCULAR | Status: AC
  Filled 2020-02-19: qty 5

## 2020-02-19 MED ORDER — PROPOFOL 500 MG/50ML IV EMUL
INTRAVENOUS | Status: DC | PRN
  Administered 2020-02-19: 100 ug/kg/min via INTRAVENOUS

## 2020-02-19 MED ORDER — SODIUM CHLORIDE 0.9 % IV SOLN: INTRAVENOUS | Status: DC

## 2020-02-19 MED ORDER — FENTANYL CITRATE (PF) 250 MCG/5ML IJ SOLN
INTRAMUSCULAR | Status: DC | PRN
  Administered 2020-02-19: 100 ug via INTRAVENOUS

## 2020-02-19 MED ORDER — 0.9 % SODIUM CHLORIDE (POUR BTL) OPTIME
TOPICAL | Status: DC | PRN
  Administered 2020-02-19: 1000 mL

## 2020-02-19 MED ORDER — OXYCODONE HCL 5 MG/5ML PO SOLN: 5.0000 mg | Freq: Once | ORAL | Status: DC | PRN

## 2020-02-19 MED ORDER — SODIUM CHLORIDE 0.9 % IV SOLN
INTRAVENOUS | Status: DC | PRN
  Administered 2020-02-19: 08:00:00 500 mL

## 2020-02-19 MED ORDER — FENTANYL CITRATE (PF) 100 MCG/2ML IJ SOLN: 25.0000 ug | INTRAMUSCULAR | Status: DC | PRN

## 2020-02-19 MED ORDER — ONDANSETRON HCL 4 MG/2ML IJ SOLN: 4.0000 mg | Freq: Once | INTRAMUSCULAR | Status: DC | PRN

## 2020-02-19 MED ORDER — MIDAZOLAM HCL 2 MG/2ML IJ SOLN
INTRAMUSCULAR | Status: AC
  Filled 2020-02-19: qty 2

## 2020-02-19 MED ORDER — LIDOCAINE-EPINEPHRINE (PF) 1 %-1:200000 IJ SOLN
INTRAMUSCULAR | Status: AC
  Filled 2020-02-19: qty 30

## 2020-02-19 MED ORDER — MIDAZOLAM HCL 5 MG/5ML IJ SOLN
INTRAMUSCULAR | Status: DC | PRN
  Administered 2020-02-19: 2 mg via INTRAVENOUS

## 2020-02-19 MED ORDER — ONDANSETRON HCL 4 MG/2ML IJ SOLN
INTRAMUSCULAR | Status: DC | PRN
  Administered 2020-02-19: 4 mg via INTRAVENOUS

## 2020-02-19 MED ORDER — CHLORHEXIDINE GLUCONATE 0.12 % MT SOLN
OROMUCOSAL | Status: AC
  Administered 2020-02-19: 15 mL via OROMUCOSAL
  Filled 2020-02-19: qty 15

## 2020-02-19 MED ORDER — HYDROCODONE-ACETAMINOPHEN 5-325 MG PO TABS: 1.0000 | ORAL_TABLET | ORAL | 0 refills | Status: DC | PRN

## 2020-02-19 MED ORDER — CHLORHEXIDINE GLUCONATE 0.12 % MT SOLN: 15.0000 mL | Freq: Once | OROMUCOSAL | Status: AC

## 2020-02-19 MED ORDER — LIDOCAINE 2% (20 MG/ML) 5 ML SYRINGE
INTRAMUSCULAR | Status: AC
  Filled 2020-02-19: qty 5

## 2020-02-19 MED ORDER — CHLORHEXIDINE GLUCONATE 4 % EX LIQD: 60.0000 mL | Freq: Once | CUTANEOUS | Status: DC

## 2020-02-19 MED ORDER — CEFAZOLIN SODIUM-DEXTROSE 2-4 GM/100ML-% IV SOLN
2.0000 g | INTRAVENOUS | Status: AC
  Administered 2020-02-19: 2 g via INTRAVENOUS
  Filled 2020-02-19: qty 100

## 2020-02-19 MED ORDER — ORAL CARE MOUTH RINSE: 15.0000 mL | Freq: Once | OROMUCOSAL | Status: AC

## 2020-02-19 MED ORDER — LIDOCAINE-EPINEPHRINE (PF) 1 %-1:200000 IJ SOLN
INTRAMUSCULAR | Status: DC | PRN
  Administered 2020-02-19: 12 mL

## 2020-02-19 MED ORDER — PROPOFOL 10 MG/ML IV BOLUS
INTRAVENOUS | Status: AC
  Filled 2020-02-19: qty 20

## 2020-02-19 MED ORDER — LIDOCAINE 2% (20 MG/ML) 5 ML SYRINGE
INTRAMUSCULAR | Status: DC | PRN
  Administered 2020-02-19: 20 mg via INTRAVENOUS

## 2020-02-19 MED ORDER — SODIUM CHLORIDE 0.9 % IV SOLN
INTRAVENOUS | Status: AC
  Filled 2020-02-19: qty 1.2

## 2020-02-19 MED ORDER — OXYCODONE HCL 5 MG PO TABS: 5.0000 mg | ORAL_TABLET | Freq: Once | ORAL | Status: DC | PRN

## 2020-02-19 SURGICAL SUPPLY — 29 items
ADH SKN CLS APL DERMABOND .7 (GAUZE/BANDAGES/DRESSINGS) ×1
ARMBAND PINK RESTRICT EXTREMIT (MISCELLANEOUS) ×2 IMPLANT
CANISTER SUCT 3000ML PPV (MISCELLANEOUS) ×2 IMPLANT
CLIP VESOCCLUDE MED 6/CT (CLIP) ×2 IMPLANT
CLIP VESOCCLUDE SM WIDE 6/CT (CLIP) ×3 IMPLANT
COVER PROBE W GEL 5X96 (DRAPES) IMPLANT
COVER WAND RF STERILE (DRAPES) ×2 IMPLANT
DERMABOND ADVANCED (GAUZE/BANDAGES/DRESSINGS) ×1
DERMABOND ADVANCED .7 DNX12 (GAUZE/BANDAGES/DRESSINGS) ×1 IMPLANT
ELECT REM PT RETURN 9FT ADLT (ELECTROSURGICAL) ×2
ELECTRODE REM PT RTRN 9FT ADLT (ELECTROSURGICAL) ×1 IMPLANT
GLOVE BIO SURGEON STRL SZ7.5 (GLOVE) ×2 IMPLANT
GOWN STRL REUS W/ TWL LRG LVL3 (GOWN DISPOSABLE) ×2 IMPLANT
GOWN STRL REUS W/ TWL XL LVL3 (GOWN DISPOSABLE) ×1 IMPLANT
GOWN STRL REUS W/TWL LRG LVL3 (GOWN DISPOSABLE) ×4
GOWN STRL REUS W/TWL XL LVL3 (GOWN DISPOSABLE) ×2
INSERT FOGARTY SM (MISCELLANEOUS) IMPLANT
KIT BASIN OR (CUSTOM PROCEDURE TRAY) ×2 IMPLANT
KIT TURNOVER KIT B (KITS) ×2 IMPLANT
NS IRRIG 1000ML POUR BTL (IV SOLUTION) ×2 IMPLANT
PACK CV ACCESS (CUSTOM PROCEDURE TRAY) ×2 IMPLANT
PAD ARMBOARD 7.5X6 YLW CONV (MISCELLANEOUS) ×4 IMPLANT
SUT MNCRL AB 4-0 PS2 18 (SUTURE) ×2 IMPLANT
SUT PROLENE 6 0 BV (SUTURE) ×2 IMPLANT
SUT VIC AB 3-0 SH 27 (SUTURE) ×2
SUT VIC AB 3-0 SH 27X BRD (SUTURE) ×1 IMPLANT
TOWEL GREEN STERILE (TOWEL DISPOSABLE) ×2 IMPLANT
UNDERPAD 30X36 HEAVY ABSORB (UNDERPADS AND DIAPERS) ×2 IMPLANT
WATER STERILE IRR 1000ML POUR (IV SOLUTION) ×1 IMPLANT

## 2020-02-19 NOTE — Anesthesia Postprocedure Evaluation (Signed)
Anesthesia Post Note  Patient: [name]  Procedure(s) Performed: LEFT ARM BRACHIOCEPHALIC  ARTERIOVENOUS (AV) FISTULA CREATION (Left )     Patient location during evaluation: PACU Anesthesia Type: MAC Level of consciousness: awake and alert Pain management: pain level controlled Vital Signs Assessment: post-procedure vital signs reviewed and stable Respiratory status: spontaneous breathing, nonlabored ventilation, respiratory function stable and patient connected to nasal cannula oxygen Cardiovascular status: stable and blood pressure returned to baseline Postop Assessment: no apparent nausea or vomiting Anesthetic complications: no   No complications documented.  Last Vitals:  Vitals:   02/19/20 0855 02/19/20 0910  BP: (!) 153/78 (!) 159/72  Pulse: (!) 57 (!) 56  Resp: 17 17  Temp: (!) 36 C (!) 36.3 C  SpO2: 95% 94%    Last Pain:  Vitals:   02/19/20 0910  TempSrc:   PainSc: 0-No pain                 Carrol Hougland COKER

## 2020-02-19 NOTE — Transfer of Care (Signed)
Immediate Anesthesia Transfer of Care Note  Patient: KASHMIR LEEDY  Procedure(s) Performed: LEFT ARM BRACHIOCEPHALIC  ARTERIOVENOUS (AV) FISTULA CREATION (Left )  Patient Location: PACU  Anesthesia Type:MAC  Level of Consciousness: awake and alert   Airway & Oxygen Therapy: Patient Spontanous Breathing and Patient connected to face mask oxygen  Post-op Assessment: Report given to RN and Post -op Vital signs reviewed and stable  Post vital signs: Reviewed and stable  Last Vitals:  Vitals Value Taken Time  BP 123/92   Temp    Pulse 58 02/19/20 0837  Resp 10   SpO2 100 % 02/19/20 0837  Vitals shown include unvalidated device data.  Last Pain:  Vitals:   02/19/20 0658  TempSrc:   PainSc: 0-No pain      Patients Stated Pain Goal: 3 (43/73/57 8978)  Complications: No complications documented.

## 2020-02-19 NOTE — Op Note (Signed)
    Patient name: [name] MRN: [id number] DOB: 18-Jun-1960 Sex: female  02/19/2020 Pre-operative Diagnosis: ckd Post-operative diagnosis:  Same Surgeon:  Erlene Quan C. Donzetta Matters, MD Assistant: Risa Grill, PA Procedure Performed: Left radial artery to cephalic vein arteriovenous fistula creation   Indications: 58-year female with chronic kidney disease. She is right-hand dominant. She is indicated for left arm AV fistula creation.  Findings: Cephalic vein measured approximately 2-1/2 mm at the wrist. The antecubital measured approximately 4 mm and was easily dilated with a 4 dilator. There was an early takeoff of the radial artery we anastomose the vein to the radial artery. At completion there was light thrill in the fistula however very good flow with Doppler and a palpable radial artery pulse confirmed with Doppler.   Procedure:  The patient was identified in the holding area and taken to the operating room where she was placed supine on upper table and MAC anesthesia was induced. She was sterilely prepped and draped in the left upper extremity usual fashion, antibiotics were ministered and a timeout was called. Ultrasound was used to identify what appeared to be a suitable cephalic vein at the antecubitum. The brachial artery was identified there there was an early takeoff of the radial artery as well. The areas anesthetized with 1% lidocaine a transverse incision was made. We first dissected out the vein marked if orientation. We dissected through the deep fascia identified the brachial artery bifurcation which was just at the wrist. We placed a vessel loop around what appeared to be the radial artery. We then clamped the vein distally transected and tied it off with 2-0 silk suture. We serially dilated up to 4 mm flushed with heparinized saline and clamped. Radial artery was clamped distally proximally opened longitudinally and flushed with heparinized saline distally. We then sewed the vein  end-to-side with 6-0 Prolene suture. Prior completion low flushing directions. Upon completion there was very good signal in the vein there was a radial artery pulse at the wrist confirmed with Doppler there was augmentation of the Doppler signal with compression of the fistula. Satisfied we irrigated the wound obtaining the stasis closed in layers with Vicryl and Monocryl. She was awakened from anesthesia having tolerated procedure without any complication. All counts were correct at completion.  EBL: 20 cc   Mckenzie Bove C. Donzetta Matters, MD [facility] Office: [phone] Pager: [phone]

## 2020-02-19 NOTE — Anesthesia Preprocedure Evaluation (Signed)
Anesthesia Evaluation  Patient identified by MRN, date of birth, ID band Patient awake    Reviewed: Allergy & Precautions, NPO status , Patient's Chart, lab work & pertinent test results  Airway Mallampati: II  TM Distance: >3 FB Neck ROM: Full    Dental  (+) Teeth Intact, Loose,    Pulmonary former smoker,    breath sounds clear to auscultation       Cardiovascular hypertension,  Rhythm:Regular Rate:Normal     Neuro/Psych    GI/Hepatic   Endo/Other  diabetes  Renal/GU      Musculoskeletal   Abdominal   Peds  Hematology   Anesthesia Other Findings   Reproductive/Obstetrics                             Anesthesia Physical Anesthesia Plan  ASA: III  Anesthesia Plan: MAC   Post-op Pain Management:    Induction: Intravenous  PONV Risk Score and Plan: Ondansetron and Propofol infusion  Airway Management Planned: Natural Airway and Simple Face Mask  Additional Equipment:   Intra-op Plan:   Post-operative Plan:   Informed Consent: I have reviewed the patients History and Physical, chart, labs and discussed the procedure including the risks, benefits and alternatives for the proposed anesthesia with the patient or authorized representative who has indicated his/her understanding and acceptance.     Dental advisory given  Plan Discussed with: CRNA and Anesthesiologist  Anesthesia Plan Comments:         Anesthesia Quick Evaluation

## 2020-02-19 NOTE — Discharge Instructions (Signed)
° °  [facility] ° °Discharge Instructions ° °AV Fistula or Graft Surgery for Dialysis Access ° °Please refer to the following instructions for your post-procedure care. Your surgeon or physician assistant will discuss any changes with you. ° °Activity ° °You may drive the day following your surgery, if you are comfortable and no longer taking prescription pain medication. Resume full activity as the soreness in your incision resolves. ° °Bathing/Showering ° °You may shower after you go home. Keep your incision dry for 48 hours. Do not soak in a bathtub, hot tub, or swim until the incision heals completely. You may not shower if you have a hemodialysis catheter. ° °Incision Care ° °Clean your incision with mild soap and water after 48 hours. Pat the area dry with a clean towel. You do not need a bandage unless otherwise instructed. Do not apply any ointments or creams to your incision. You may have skin glue on your incision. Do not peel it off. It will come off on its own in about one week. Your arm may swell a bit after surgery. To reduce swelling use pillows to elevate your arm so it is above your heart. Your doctor will tell you if you need to lightly wrap your arm with an ACE bandage. ° °Diet ° °Resume your normal diet. There are not special food restrictions following this procedure. In order to heal from your surgery, it is CRITICAL to get adequate nutrition. Your body requires vitamins, minerals, and protein. Vegetables are the best source of vitamins and minerals. Vegetables also provide the perfect balance of protein. Processed food has little nutritional value, so try to avoid this. ° °Medications ° °Resume taking all of your medications. If your incision is causing pain, you may take over-the counter pain relievers such as acetaminophen (Tylenol). If you were prescribed a stronger pain medication, please be aware these medications can cause nausea and constipation. Prevent  nausea by taking the medication with a snack or meal. Avoid constipation by drinking plenty of fluids and eating foods with high amount of fiber, such as fruits, vegetables, and grains. Do not take Tylenol if you are taking prescription pain medications. ° ° ° ° °Follow up °Your surgeon may want to see you in the office following your access surgery. If so, this will be arranged at the time of your surgery. ° °Please call us immediately for any of the following conditions: ° °Increased pain, redness, drainage (pus) from your incision site °Fever of 101 degrees or higher °Severe or worsening pain at your incision site °Hand pain or numbness. ° °Reduce your risk of vascular disease: ° °Stop smoking. If you would like help, call [facility] at 1-800-QUIT-NOW (1-800-784-8669) or [place] at 336-586-4000 ° °Manage your cholesterol °Maintain a desired weight °Control your diabetes °Keep your blood pressure down ° °Dialysis ° °It will take several weeks to several months for your new dialysis access to be ready for use. Your surgeon will determine when it is OK to use it. Your nephrologist will continue to direct your dialysis. You can continue to use your Permcath until your new access is ready for use. ° °If you have any questions, please call the office at [phone]. ° °

## 2020-02-19 NOTE — H&P (Signed)
HPI:  [name] is a 60 y.o. female with chronic kidney disease.  She has a history of diabetes, hypertension, hyperlipidemia.  She denies any previous vascular disease does have known coronary artery disease.  She is right-hand dominant.  She denies any history of left upper extremity or chest or breast surgery.  She has no previous pacemakers.  She has never been on dialysis nor has she had family on dialysis      Past Medical History:  Diagnosis Date  . Angina   . CAD (coronary artery disease), non obstructive CAD in 2010    a. 2013 Cath: LAD 60, RCA 60m, 70d; b. 2014 Cath: stable anatomy.  . Chronic kidney disease    polycystic kidney disease , elevated creatine  . COPD (chronic obstructive pulmonary disease) ([place])   . Diabetes mellitus without complication ([place])    a. 12/2014 HbA1c 6.5; b. Rx Glipizide - not taking.  . Family history of premature CAD   . Hx of adenomatous colonic polyps 06/11/2016  . Hyperlipidemia   . Hypertension   . Hypertensive heart disease   . Palpitations   . Reflux   . Sleep apnea    mild, no c-pap at this point  . Tobacco abuse         Family History  Problem Relation Age of Onset  . Stroke Father   . Hypertension Father   . Coronary artery disease Brother   . Diabetes Mother   . Stroke Maternal Grandmother   . Cancer Maternal Grandmother        Breast cancer  . Hypertension Maternal Grandfather   . Heart attack Maternal Grandfather   . Heart attack Maternal Aunt 41  . Colon cancer Neg Hx   . Esophageal cancer Neg Hx   . Stomach cancer Neg Hx   . Rectal cancer Neg Hx         Past Surgical History:  Procedure Laterality Date  . APPENDECTOMY  1974  . CARDIAC CATHETERIZATION  02/07/2008   Recommendation-F/U stress test  . CARDIAC CATHETERIZATION  04/16/2011   Recommendation-Increase medical therapy trial  . CARDIOVASCULAR STRESS TEST  03/25/2011   Suspect subtle anterior septal ischemia   . COLONOSCOPY    . FRACTURE SURGERY Left 1972   forearm fracture  . LEFT HEART CATHETERIZATION WITH CORONARY ANGIOGRAM N/A 04/16/2011   Procedure: LEFT HEART CATHETERIZATION WITH CORONARY ANGIOGRAM;  Surgeon: Troy Sine, MD;  Location: [facility] CATH LAB;  Service: Cardiovascular;  Laterality: N/A;  . LEFT HEART CATHETERIZATION WITH CORONARY ANGIOGRAM N/A 09/01/2012   Procedure: LEFT HEART CATHETERIZATION WITH CORONARY ANGIOGRAM;  Surgeon: Pixie Casino, MD;  Location: [facility] CATH LAB;  Service: Cardiovascular;  Laterality: N/A;  . POLYPECTOMY    . RENAL DOPPLER  06/05/2009   No evidence of significant diameter reduction  . TRANSTHORACIC ECHOCARDIOGRAM  01/25/2008   EF 60-65%, Moderate concentric LVH  . WRIST FRACTURE SURGERY Right ~ 5    Short Social History:  Social History        Tobacco Use  . Smoking status: Former Smoker    Packs/day: 0.25    Years: 30.00    Pack years: 7.50    Types: Cigarettes  . Smokeless tobacco: Never Used  . Tobacco comment: 2-  weeks  Substance Use Topics  . Alcohol use: No    Alcohol/week: 0.0 standard drinks         Allergies  Allergen Reactions  . Bupropion  Other reaction(s): Other Suicidal thoughts and increased depression  . Doxazosin Mesylate     Increased heart rate  . Rosuvastatin Other (See Comments)    myalgia  . Simvastatin     myalgias  . Wellbutrin [Bupropion Hcl]     unknown  . Zetia [Ezetimibe]     myalgias          Current Outpatient Medications  Medication Sig Dispense Refill  . albuterol (PROVENTIL HFA;VENTOLIN HFA) 108 (90 Base) MCG/ACT inhaler Inhale 2 puffs into the lungs every 4 (four) hours as needed. 1 Inhaler 1  . amLODipine (NORVASC) 10 MG tablet Take 1 tablet (10 mg total) by mouth daily. 90 tablet 3  . aspirin 81 MG tablet Take 81 mg by mouth at bedtime.    . carvedilol (COREG) 25 MG tablet TAKE 1 TABLET (25 MG TOTAL) BY MOUTH 2 (TWO) TIMES DAILY WITH A  MEAL. TO REPLACE METOPROLOL 180 tablet 1  . cloNIDine (CATAPRES) 0.2 MG tablet TAKE 1 TABLET (0.2 MG TOTAL) BY MOUTH 2 (TWO) TIMES DAILY. 180 tablet 1  . Evolocumab (REPATHA SURECLICK) 993 MG/ML SOAJ Inject 140 mg into the skin every 14 (fourteen) days. 2 mL 11  . hydrALAZINE (APRESOLINE) 100 MG tablet Take 1 tablet (100 mg total) by mouth 3 (three) times daily. 270 tablet 3  . isosorbide mononitrate (IMDUR) 60 MG 24 hr tablet Take 60 mg by mouth daily.    [place] Calcium 500 MG TABS Take 1 tablet by mouth 2 (two) times daily.     No current facility-administered medications for this visit.    Review of Systems  Constitutional:  Constitutional negative. HENT: HENT negative.  Eyes: Eyes negative.  Respiratory: Respiratory negative.  Cardiovascular: Cardiovascular negative.  GI: Gastrointestinal negative.  Musculoskeletal: Musculoskeletal negative.  Skin: Skin negative.  Neurological: Neurological negative. Hematologic: Hematologic/lymphatic negative.  Psychiatric: Psychiatric negative.        Objective:   Vitals:   02/19/20 0613  BP: (!) 163/59  Pulse: (!) 57  Resp: 18  Temp: 97.7 F (36.5 C)  SpO2: 95%     Physical Exam HENT:     Head: Normocephalic and atraumatic.     Nose:     Comments: Wearing a mask with a peacock Eyes:     Pupils: Pupils are equal, round, and reactive to light.  Cardiovascular:     Pulses: Normal pulses.  Pulmonary:     Effort: Pulmonary effort is normal.  Abdominal:     General: Abdomen is flat.     Palpations: Abdomen is soft.  Musculoskeletal:        General: Normal range of motion.  Skin:    General: Skin is warm and dry.     Capillary Refill: Capillary refill takes less than 2 seconds.  Neurological:     General: No focal deficit present.     Mental Status: She is alert.  Psychiatric:        Mood and Affect: Mood normal.        Behavior: Behavior normal.        Thought Content: Thought content normal.         Judgment: Judgment normal.     Data: I have independently interpreted her upper extremity arterial duplex.  Right side brachial artery 2.54 cm and triphasic and less than 0.52 cm and triphasic.  I have independent interpreted her vein mapping appears to have suitable basilic and cephalic veins throughout her bilateral upper extremities.  Assessment/Plan:     60 year old female with chronic kidney disease.  She is right-hand dominant.  She appears to have good cephalic vein all the way to the wrist.  We will plan for left upper extremity likely radiocephalic versus brachiocephalic AV fistula in the near future.  We discussed the risk and benefits versus alternatives and she demonstrates good understanding  Lakota Schweppe C. Donzetta Matters, MD [facility] [place] Office: [phone] Pager: [phone]

## 2020-02-20 ENCOUNTER — Encounter (HOSPITAL_COMMUNITY): Payer: Self-pay | Admitting: Vascular Surgery

## 2020-03-12 ENCOUNTER — Other Ambulatory Visit: Payer: Self-pay

## 2020-03-12 DIAGNOSIS — Z713 Dietary counseling and surveillance: Secondary | ICD-10-CM | POA: Insufficient documentation

## 2020-03-12 DIAGNOSIS — N184 Chronic kidney disease, stage 4 (severe): Secondary | ICD-10-CM

## 2020-03-21 ENCOUNTER — Other Ambulatory Visit: Payer: Self-pay

## 2020-03-21 ENCOUNTER — Encounter: Payer: Self-pay | Admitting: *Deleted

## 2020-03-21 ENCOUNTER — Ambulatory Visit (INDEPENDENT_AMBULATORY_CARE_PROVIDER_SITE_OTHER): Payer: Managed Care, Other (non HMO) | Admitting: Physician Assistant

## 2020-03-21 ENCOUNTER — Other Ambulatory Visit: Payer: Self-pay | Admitting: *Deleted

## 2020-03-21 ENCOUNTER — Ambulatory Visit (HOSPITAL_COMMUNITY)
Admission: RE | Admit: 2020-03-21 | Discharge: 2020-03-21 | Disposition: A | Discharge status: Home / Self Care | Payer: Managed Care, Other (non HMO) | Source: Ambulatory Visit | Attending: Vascular Surgery | Admitting: Vascular Surgery

## 2020-03-21 VITALS — BP 183/80 | HR 58 | Temp 98.6°F | Resp 20 | Ht 64.0 in | Wt 190.0 lb

## 2020-03-21 DIAGNOSIS — N184 Chronic kidney disease, stage 4 (severe): Secondary | ICD-10-CM | POA: Diagnosis not present

## 2020-03-21 NOTE — Progress Notes (Signed)
POST OPERATIVE OFFICE NOTE    CC:  F/u for surgery  HPI:  This is a 60 y.o. female who is s/p left RC AVF on 2.8.2022 by Dr. Donzetta Matters for CKD.     She has a history of diabetes, hypertension, hyperlipidemia. She denies any previous vascular disease does have known coronary artery disease. She is right-hand dominant. She denies any history of left upper extremity or chest or breast surgery. She has no previous pacemakers. She has never been on dialysis nor has she had family on dialysis.  Pt states she does not have pain/numbness in left hand.   She is not yet on HD and Dr. Carolin Sicks is her nephrologist.  She states she is seeing a nutritionist and has lost ~ 30lbs.   Her BP is elevated today, however, she states that at home, it normally runs 425'Z systolic.   Allergies  Allergen Reactions  . Bupropion     Other reaction(s): Other Suicidal thoughts and increased depression  . Doxazosin Mesylate     Increased heart rate  . Rosuvastatin Other (See Comments)    myalgia  . Simvastatin     myalgias  . Wellbutrin [Bupropion Hcl]     unknown  . Zetia [Ezetimibe]     myalgias    Current Outpatient Medications  Medication Sig Dispense Refill  . albuterol (PROVENTIL HFA;VENTOLIN HFA) 108 (90 Base) MCG/ACT inhaler Inhale 2 puffs into the lungs every 4 (four) hours as needed. (Patient taking differently: Inhale 2 puffs into the lungs every 4 (four) hours as needed for wheezing or shortness of breath.) 1 Inhaler 1  . amLODipine (NORVASC) 10 MG tablet Take 1 tablet (10 mg total) by mouth daily. 90 tablet 3  . aspirin 81 MG tablet Take 81 mg by mouth at bedtime.    . carvedilol (COREG) 25 MG tablet TAKE 1 TABLET (25 MG TOTAL) BY MOUTH 2 (TWO) TIMES DAILY WITH A MEAL. TO REPLACE METOPROLOL 180 tablet 1  . cloNIDine (CATAPRES) 0.2 MG tablet TAKE 1 TABLET (0.2 MG TOTAL) BY MOUTH 2 (TWO) TIMES DAILY. 180 tablet 1  . Evolocumab (REPATHA SURECLICK) 563 MG/ML SOAJ Inject 140 mg into the skin every  14 (fourteen) days. 2 mL 11  . hydrALAZINE (APRESOLINE) 100 MG tablet Take 1 tablet (100 mg total) by mouth 3 (three) times daily. 270 tablet 3  . HYDROcodone-acetaminophen (NORCO/VICODIN) 5-325 MG tablet Take 1 tablet by mouth every 4 (four) hours as needed for moderate pain. 10 tablet 0  . isosorbide mononitrate (IMDUR) 60 MG 24 hr tablet Take 60 mg by mouth daily.    Loma [place] Calcium 500 MG TABS Take 1 tablet by mouth 2 (two) times daily.     No current facility-administered medications for this visit.     ROS:  See HPI  Physical Exam:  Today's Vitals   03/21/20 1100  BP: (!) 183/80  Pulse: (!) 58  Resp: 20  Temp: 98.6 F (37 C)  TempSrc: Temporal  SpO2: 97%  Weight: 190 lb (86.2 kg)  Height: 5\' 4"  (1.626 m)   Body mass index is 32.61 kg/m.   Incision:  Healed nicely with small spitting stitch. Extremities:   There is a palpable left radial pulse.   Motor and sensory are in tact.   There is a thrill/bruit present.  The fistula is easily palpable distally, but more difficult to palpate proximally   Dialysis Duplex on 03/21/2020: Diameter:  0.50-0.67cm Depth:  0.36-1.36cm   Assessment/Plan:  This  is a 60 y.o. female who is s/p: left RC AVF on 2.8.2022 by Dr. Donzetta Matters for CKD.      -the pt does not have evidence of steal. -the fistula is deep in the proximal arm and will need superficialization.  I discussed this with the pt that there will most likely be two incisions to bring the fistula closer to the skin.  This will be scheduled at a time that is convenient for her and when Dr. Donzetta Matters has OR time available.  -she is on a baby asa daily -elevated BP today most likely related to her visit as she states her systolic is usually 373'G at home.  She states that she has low blood pressure in the mornings and is working with her MD to adjust her medications accordingly.    Leontine Locket, [facility] [phone]  Clinic MD:  Donzetta Matters

## 2020-05-07 ENCOUNTER — Telehealth: Payer: Self-pay

## 2020-05-07 NOTE — Telephone Encounter (Signed)
Patient called to ask how long she would be out of surgery s/p fistula superficialization. Advised her it is usually 2-3 days and no heavy lifting for 4-6 weeks. Patient verbalizes understanding.

## 2020-05-12 ENCOUNTER — Encounter (HOSPITAL_COMMUNITY): Payer: Self-pay | Admitting: Vascular Surgery

## 2020-05-12 ENCOUNTER — Other Ambulatory Visit (HOSPITAL_COMMUNITY)
Admission: RE | Admit: 2020-05-12 | Discharge: 2020-05-12 | Disposition: A | Discharge status: Home / Self Care | Payer: Managed Care, Other (non HMO) | Source: Ambulatory Visit | Attending: Vascular Surgery | Admitting: Vascular Surgery

## 2020-05-12 DIAGNOSIS — Z20822 Contact with and (suspected) exposure to covid-19: Secondary | ICD-10-CM | POA: Insufficient documentation

## 2020-05-12 DIAGNOSIS — Z01812 Encounter for preprocedural laboratory examination: Secondary | ICD-10-CM | POA: Diagnosis present

## 2020-05-12 NOTE — Progress Notes (Signed)
Anesthesia Chart Review: [name]  Case: [id number] Date/Time: 05/13/20 0715   Procedure: FISTULA SUPERFICIALIZATION LEFT (Left )   Anesthesia type: Choice   Pre-op diagnosis: ESRD   Location: [facility] OR ROOM 09 / [facility]   Surgeons: Waynetta Sandy, MD      DISCUSSION: Patient is a 60 year old female scheduled for the above procedure. She is s/p left brachiocephalic AVF creation on 02/19/39.   History includes former smoker (quit 09/12/18), CAD ([place] 2013 for abnormal stress test; [place] 2014 for chest pain, 60% RCA, medical therapy with better HTN control recommended), HTN, HLD, palpitations, reflux, OSA, COPD, CKD, GERD, DM2.   Last cardiology follow-up 11/13/19 with Kerin Ransom, PA-C. Six mont follow-up recommended.   Preoperative COVID-19 test is scheduled for 05/12/20. Anesthesia team to evaluate on the day of surgery.    VS: LMP 12/14/2013  BP Readings from Last 3 Encounters:  03/21/20 (!) 183/80  02/19/20 (!) 159/72  12/28/19 (!) 162/80   Pulse Readings from Last 3 Encounters:  03/21/20 (!) 58  02/19/20 (!) 56  12/28/19 (!) 58    PROVIDERS: Patient, No Pcp Per (Inactive)  Lawson Radar, MD is nephrologist Quay Burow, MD is cardiologist. Last visit 11/13/19 with Kerin Ransom, PA-C. Volume status and HTN improved with dietary changes. She could not afford CPAP. Advised six month follow-up and keep OSA visit with Shelva Majestic, MD (last 09/12/19).    LABS: ISTAT day of surgery.    EKG: 09/14/19 ([facility]): NSR. ST/T wave abnormality, consider lateral ischemia. Prolonged QT (QT 468 ms, QTc 475 ms).   CV: Echo 11/02/19: IMPRESSIONS  1. Left ventricular ejection fraction, by estimation, is 60 to 65%. The  left ventricle has normal function. The left ventricle has no regional  wall motion abnormalities. There is moderate left ventricular hypertrophy.  Left ventricular diastolic  parameters are consistent with Grade I diastolic dysfunction (impaired   relaxation).  2. Right ventricular systolic function is normal. The right ventricular  size is normal.  3. The mitral valve is normal in structure. Trivial mitral valve  regurgitation. No evidence of mitral stenosis.  4. The aortic valve is normal in structure. Aortic valve regurgitation is  not visualized. No aortic stenosis is present.  5. The inferior vena cava is normal in size with greater than 50%  respiratory variability, suggesting right atrial pressure of 3 mmHg.    LHC 09/01/12: EF 60-65%. Hyperdynamic wall motion. No MR. Normal LM. Mild luminal irregularities distal LAD, extends to apex. Large D1 free of stenosis. Large CX with no significant stenosis. Tiny OM1 branch. Moderate OM2 with no stenosis. Right dominant. 60% mid-distal RCA, stable when compared to 04/16/11 cath. No PDA/PLB stenoses. Impression: 1.  60% mid-distal RCA stenosis -stable. No other obstructive CAD. 2.  LVEDP = 17 mmHg 3.  Blood pressure was significantly elevated. Plan: 1.  Improved blood pressure control may help her symptoms. 2.  Monitor overnight due to difficult access for possible bleeding complications. 3.  Anticipate d/c in the am on increased BP meds.   24-48 hour Holter Monitor 08/19/11: Predominant rhythm was sinus. Ventricular ectopics were noted in single and Quadra many forms. Supraventricular ectopics were noted in single beat form.   Past Medical History:  Diagnosis Date  . Angina   . CAD (coronary artery disease), non obstructive CAD in 2010    a. 2013 Cath: LAD 60, RCA 46m, 70d; b. 2014 Cath: stable anatomy.  . Chronic kidney disease    polycystic  kidney disease , elevated creatine  . COPD (chronic obstructive pulmonary disease) ([place])   . Diabetes mellitus without complication ([place])    a. 12/2014 HbA1c 6.5; b. Rx Glipizide - not taking.  . Family history of premature CAD   . GERD (gastroesophageal reflux disease)   . Hx of adenomatous colonic polyps 06/11/2016  .  Hyperlipidemia   . Hypertension   . Hypertensive heart disease   . Palpitations   . Reflux   . Sleep apnea    mild, no c-pap at this point  . Tobacco abuse     Past Surgical History:  Procedure Laterality Date  . APPENDECTOMY  1974  . AV FISTULA PLACEMENT Left 02/19/2020   Procedure: LEFT ARM BRACHIOCEPHALIC  ARTERIOVENOUS (AV) FISTULA CREATION;  Surgeon: Waynetta Sandy, MD;  Location: [facility];  Service: Vascular;  Laterality: Left;  . CARDIAC CATHETERIZATION  02/07/2008   Recommendation-F/U stress test  . CARDIAC CATHETERIZATION  04/16/2011   Recommendation-Increase medical therapy trial  . CARDIOVASCULAR STRESS TEST  03/25/2011   Suspect subtle anterior septal ischemia  . COLONOSCOPY    . FRACTURE SURGERY Left 1972   forearm fracture  . LEFT HEART CATHETERIZATION WITH CORONARY ANGIOGRAM N/A 04/16/2011   Procedure: LEFT HEART CATHETERIZATION WITH CORONARY ANGIOGRAM;  Surgeon: Troy Sine, MD;  Location: [facility] CATH LAB;  Service: Cardiovascular;  Laterality: N/A;  . LEFT HEART CATHETERIZATION WITH CORONARY ANGIOGRAM N/A 09/01/2012   Procedure: LEFT HEART CATHETERIZATION WITH CORONARY ANGIOGRAM;  Surgeon: Pixie Casino, MD;  Location: [facility] CATH LAB;  Service: Cardiovascular;  Laterality: N/A;  . POLYPECTOMY    . RENAL DOPPLER  06/05/2009   No evidence of significant diameter reduction  . TRANSTHORACIC ECHOCARDIOGRAM  01/25/2008   EF 60-65%, Moderate concentric LVH  . WRIST FRACTURE SURGERY Right ~ 1996    MEDICATIONS: No current facility-administered medications for this encounter.   Marland Kitchen albuterol (PROVENTIL HFA;VENTOLIN HFA) 108 (90 Base) MCG/ACT inhaler  . amLODipine (NORVASC) 10 MG tablet  . aspirin 81 MG tablet  . carvedilol (COREG) 25 MG tablet  . Cholecalciferol (VITAMIN D3) 50 MCG (2000 UT) TABS  . cloNIDine (CATAPRES) 0.2 MG tablet  . [employer] IRON 325 (65 Fe) MG tablet  . Evolocumab (REPATHA SURECLICK) 646 MG/ML SOAJ  . hydrALAZINE (APRESOLINE) 100 MG tablet  . Oyster  Shell Calcium 500 MG TABS  . sodium bicarbonate 650 MG tablet    Myra Gianotti, PA-C Surgical Short Stay/Anesthesiology [facility] Phone [phone] [facility] Phone [phone] 05/12/2020 1:57 PM

## 2020-05-12 NOTE — Anesthesia Preprocedure Evaluation (Addendum)
Anesthesia Evaluation  Patient identified by MRN, date of birth, ID band Patient awake    Reviewed: Allergy & Precautions, NPO status , Patient's Chart, lab work & pertinent test results  Airway Mallampati: II  TM Distance: >3 FB Neck ROM: Full    Dental  (+) Missing,    Pulmonary sleep apnea (no CPAP) , COPD, Patient abstained from smoking., former smoker,    Pulmonary exam normal  + decreased breath sounds      Cardiovascular hypertension, Pt. on medications + CAD (non obstructive)  Normal cardiovascular exam Rhythm:Regular Rate:Normal   1. Left ventricular ejection fraction, by estimation, is 60 to 65%. The left ventricle has normal function. The left ventricle has no regional wall motion abnormalities. There is moderate left ventricular hypertrophy. Left ventricular diastolic  parameters are consistent with Grade I diastolic dysfunction (impaired relaxation). 2. Right ventricular systolic function is normal. The right ventricular size is normal. 3. The mitral valve is normal in structure. Trivial mitral valve regurgitation. No evidence of mitral stenosis. 4. The aortic valve is normal in structure. Aortic valve regurgitation is not visualized. No aortic stenosis is present. 5. The inferior vena cava is normal in size with greater than 50% respiratory variability, suggesting right atrial pressure of 3 mmHg.   Neuro/Psych negative neurological ROS  negative psych ROS   GI/Hepatic GERD  ,  Endo/Other  diabetes, Poorly Controlled, Oral Hypoglycemic Agents  Renal/GU CRF and Renal InsufficiencyRenal disease     Musculoskeletal   Abdominal (+) + obese,   Peds  Hematology   Anesthesia Other Findings   Reproductive/Obstetrics                            Anesthesia Physical Anesthesia Plan  ASA: III  Anesthesia Plan: MAC   Post-op Pain Management:    Induction: Intravenous  PONV Risk  Score and Plan: Propofol infusion and Treatment may vary due to age or medical condition  Airway Management Planned:   Additional Equipment: None  Intra-op Plan:   Post-operative Plan:   Informed Consent: I have reviewed the patients History and Physical, chart, labs and discussed the procedure including the risks, benefits and alternatives for the proposed anesthesia with the patient or authorized representative who has indicated his/her understanding and acceptance.       Plan Discussed with: CRNA and Anesthesiologist  Anesthesia Plan Comments: (MAC propofol gtt. Local by surgeon. GA/LMA as backup plan. Patient tolerated MAC/Local for creation of her fistula Norton Blizzard, MD   )      Anesthesia Quick Evaluation

## 2020-05-12 NOTE — Progress Notes (Signed)
EKG:  09/14/19 CXR: 08/14/17 ECHO:  11/02/19 Stress Test: Denies Cardiac Cath: Denies  Fasting Blood Sugar- na Checks Blood Sugar__na_ times a day  OSA/CPAP:  Patient states has been witnessed stopping breathing during sleep, but insurance would not cover a sleep study.  ASA: Continue Blood Thinners: No  Covid test 05/12/20 pending   Anesthesia Review;  CAD history per patient.  Patient denies shortness of breath, fever, cough, and chest pain at PAT appointment.  Patient verbalized understanding of instructions provided today at the PAT appointment.  Patient asked to review instructions at home and day of surgery.

## 2020-05-13 ENCOUNTER — Ambulatory Visit (HOSPITAL_COMMUNITY)
Admission: RE | Admit: 2020-05-13 | Discharge: 2020-05-13 | Disposition: A | Discharge status: Home / Self Care | Payer: Managed Care, Other (non HMO) | Attending: Vascular Surgery | Admitting: Vascular Surgery

## 2020-05-13 ENCOUNTER — Encounter (HOSPITAL_COMMUNITY)
Admission: RE | Disposition: A | Discharge status: Home / Self Care | Payer: Self-pay | Source: Home / Self Care | Attending: Vascular Surgery

## 2020-05-13 ENCOUNTER — Other Ambulatory Visit: Payer: Self-pay

## 2020-05-13 ENCOUNTER — Ambulatory Visit (HOSPITAL_COMMUNITY): Payer: Managed Care, Other (non HMO) | Admitting: Vascular Surgery

## 2020-05-13 ENCOUNTER — Encounter (HOSPITAL_COMMUNITY): Payer: Self-pay | Admitting: Vascular Surgery

## 2020-05-13 DIAGNOSIS — Z79899 Other long term (current) drug therapy: Secondary | ICD-10-CM | POA: Insufficient documentation

## 2020-05-13 DIAGNOSIS — I2510 Atherosclerotic heart disease of native coronary artery without angina pectoris: Secondary | ICD-10-CM | POA: Diagnosis not present

## 2020-05-13 DIAGNOSIS — N184 Chronic kidney disease, stage 4 (severe): Secondary | ICD-10-CM

## 2020-05-13 DIAGNOSIS — Z7951 Long term (current) use of inhaled steroids: Secondary | ICD-10-CM | POA: Insufficient documentation

## 2020-05-13 DIAGNOSIS — Z888 Allergy status to other drugs, medicaments and biological substances status: Secondary | ICD-10-CM | POA: Diagnosis not present

## 2020-05-13 DIAGNOSIS — E1122 Type 2 diabetes mellitus with diabetic chronic kidney disease: Secondary | ICD-10-CM | POA: Diagnosis not present

## 2020-05-13 DIAGNOSIS — N186 End stage renal disease: Secondary | ICD-10-CM | POA: Insufficient documentation

## 2020-05-13 DIAGNOSIS — Z7982 Long term (current) use of aspirin: Secondary | ICD-10-CM | POA: Diagnosis not present

## 2020-05-13 DIAGNOSIS — N185 Chronic kidney disease, stage 5: Secondary | ICD-10-CM

## 2020-05-13 DIAGNOSIS — E785 Hyperlipidemia, unspecified: Secondary | ICD-10-CM | POA: Diagnosis not present

## 2020-05-13 DIAGNOSIS — I120 Hypertensive chronic kidney disease with stage 5 chronic kidney disease or end stage renal disease: Secondary | ICD-10-CM | POA: Insufficient documentation

## 2020-05-13 HISTORY — PX: FISTULA SUPERFICIALIZATION: SHX6341

## 2020-05-13 LAB — POCT I-STAT, CHEM 8
BUN: 56 mg/dL — ABNORMAL HIGH (ref 6–20)
Calcium, Ion: 1.13 mmol/L — ABNORMAL LOW (ref 1.15–1.40)
Chloride: 105 mmol/L (ref 98–111)
Creatinine, Ser: 5.1 mg/dL — ABNORMAL HIGH (ref 0.44–1.00)
Glucose, Bld: 130 mg/dL — ABNORMAL HIGH (ref 70–99)
HCT: 31 % — ABNORMAL LOW (ref 36.0–46.0)
Hemoglobin: 10.5 g/dL — ABNORMAL LOW (ref 12.0–15.0)
Potassium: 3.9 mmol/L (ref 3.5–5.1)
Sodium: 141 mmol/L (ref 135–145)
TCO2: 23 mmol/L (ref 22–32)

## 2020-05-13 LAB — SARS CORONAVIRUS 2 (TAT 6-24 HRS): SARS Coronavirus 2: NEGATIVE

## 2020-05-13 LAB — GLUCOSE, CAPILLARY
Glucose-Capillary: 131 mg/dL — ABNORMAL HIGH (ref 70–99)
Glucose-Capillary: 126 mg/dL — ABNORMAL HIGH (ref 70–99)

## 2020-05-13 SURGERY — FISTULA SUPERFICIALIZATION
Anesthesia: Monitor Anesthesia Care | Laterality: Left

## 2020-05-13 MED ORDER — OXYCODONE HCL 5 MG/5ML PO SOLN: 5.0000 mg | Freq: Once | ORAL | Status: DC | PRN

## 2020-05-13 MED ORDER — ONDANSETRON HCL 4 MG/2ML IJ SOLN
INTRAMUSCULAR | Status: DC | PRN
  Administered 2020-05-13: 4 mg via INTRAVENOUS

## 2020-05-13 MED ORDER — PROPOFOL 10 MG/ML IV BOLUS
INTRAVENOUS | Status: DC | PRN
  Administered 2020-05-13: 10 mg via INTRAVENOUS

## 2020-05-13 MED ORDER — SODIUM CHLORIDE 0.9 % IV SOLN: INTRAVENOUS | Status: DC

## 2020-05-13 MED ORDER — FENTANYL CITRATE (PF) 100 MCG/2ML IJ SOLN
INTRAMUSCULAR | Status: DC | PRN
  Administered 2020-05-13: 25 ug via INTRAVENOUS

## 2020-05-13 MED ORDER — SODIUM CHLORIDE 0.9 % IV SOLN
INTRAVENOUS | Status: AC
  Filled 2020-05-13: qty 1.2

## 2020-05-13 MED ORDER — CHLORHEXIDINE GLUCONATE 4 % EX LIQD: 60.0000 mL | Freq: Once | CUTANEOUS | Status: DC

## 2020-05-13 MED ORDER — PROMETHAZINE HCL 25 MG/ML IJ SOLN: 6.2500 mg | INTRAMUSCULAR | Status: DC | PRN

## 2020-05-13 MED ORDER — LIDOCAINE 2% (20 MG/ML) 5 ML SYRINGE
INTRAMUSCULAR | Status: AC
  Filled 2020-05-13: qty 5

## 2020-05-13 MED ORDER — LIDOCAINE-EPINEPHRINE 1 %-1:100000 IJ SOLN
INTRAMUSCULAR | Status: DC | PRN
  Administered 2020-05-13: 17 mL

## 2020-05-13 MED ORDER — CEFAZOLIN SODIUM-DEXTROSE 2-4 GM/100ML-% IV SOLN
2.0000 g | INTRAVENOUS | Status: AC
  Administered 2020-05-13: 2 g via INTRAVENOUS
  Filled 2020-05-13: qty 100

## 2020-05-13 MED ORDER — MIDAZOLAM HCL 5 MG/5ML IJ SOLN
INTRAMUSCULAR | Status: DC | PRN
  Administered 2020-05-13: 1 mg via INTRAVENOUS

## 2020-05-13 MED ORDER — FENTANYL CITRATE (PF) 100 MCG/2ML IJ SOLN: 25.0000 ug | INTRAMUSCULAR | Status: DC | PRN

## 2020-05-13 MED ORDER — PROPOFOL 500 MG/50ML IV EMUL
INTRAVENOUS | Status: DC | PRN
  Administered 2020-05-13: 100 ug/kg/min via INTRAVENOUS

## 2020-05-13 MED ORDER — OXYCODONE HCL 5 MG PO TABS: 5.0000 mg | ORAL_TABLET | Freq: Once | ORAL | Status: DC | PRN

## 2020-05-13 MED ORDER — LIDOCAINE-EPINEPHRINE 1 %-1:100000 IJ SOLN
INTRAMUSCULAR | Status: AC
  Filled 2020-05-13: qty 1

## 2020-05-13 MED ORDER — MIDAZOLAM HCL 2 MG/2ML IJ SOLN
INTRAMUSCULAR | Status: AC
  Filled 2020-05-13: qty 2

## 2020-05-13 MED ORDER — SODIUM CHLORIDE 0.9 % IV SOLN
INTRAVENOUS | Status: DC | PRN
  Administered 2020-05-13: 500 mL

## 2020-05-13 MED ORDER — LACTATED RINGERS IV SOLN: INTRAVENOUS | Status: DC

## 2020-05-13 MED ORDER — LIDOCAINE 2% (20 MG/ML) 5 ML SYRINGE
INTRAMUSCULAR | Status: DC | PRN
  Administered 2020-05-13: 40 mg via INTRAVENOUS

## 2020-05-13 MED ORDER — HYDROCODONE-ACETAMINOPHEN 5-325 MG PO TABS: 1.0000 | ORAL_TABLET | Freq: Four times a day (QID) | ORAL | 0 refills | Status: DC | PRN

## 2020-05-13 MED ORDER — FENTANYL CITRATE (PF) 250 MCG/5ML IJ SOLN
INTRAMUSCULAR | Status: AC
  Filled 2020-05-13: qty 5

## 2020-05-13 MED ORDER — ORAL CARE MOUTH RINSE: 15.0000 mL | Freq: Once | OROMUCOSAL | Status: AC

## 2020-05-13 MED ORDER — ONDANSETRON HCL 4 MG/2ML IJ SOLN
INTRAMUSCULAR | Status: AC
  Filled 2020-05-13: qty 2

## 2020-05-13 MED ORDER — PROPOFOL 10 MG/ML IV BOLUS
INTRAVENOUS | Status: AC
  Filled 2020-05-13: qty 20

## 2020-05-13 MED ORDER — 0.9 % SODIUM CHLORIDE (POUR BTL) OPTIME
TOPICAL | Status: DC | PRN
  Administered 2020-05-13: 1000 mL

## 2020-05-13 MED ORDER — SODIUM CHLORIDE 0.9 % IV SOLN: INTRAVENOUS | Status: DC | PRN

## 2020-05-13 MED ORDER — CHLORHEXIDINE GLUCONATE 0.12 % MT SOLN
15.0000 mL | Freq: Once | OROMUCOSAL | Status: AC
  Administered 2020-05-13: 15 mL via OROMUCOSAL
  Filled 2020-05-13: qty 15

## 2020-05-13 SURGICAL SUPPLY — 29 items
ADH SKN CLS APL DERMABOND .7 (GAUZE/BANDAGES/DRESSINGS) ×1
ARMBAND PINK RESTRICT EXTREMIT (MISCELLANEOUS) ×2 IMPLANT
CANISTER SUCT 3000ML PPV (MISCELLANEOUS) ×2 IMPLANT
CLIP LIGATING EXTRA SM BLUE (MISCELLANEOUS) ×1 IMPLANT
CLIP VESOCCLUDE MED 6/CT (CLIP) ×2 IMPLANT
CLIP VESOCCLUDE SM WIDE 6/CT (CLIP) ×2 IMPLANT
COVER PROBE W GEL 5X96 (DRAPES) ×2 IMPLANT
COVER WAND RF STERILE (DRAPES) ×1 IMPLANT
DERMABOND ADVANCED (GAUZE/BANDAGES/DRESSINGS) ×1
DERMABOND ADVANCED .7 DNX12 (GAUZE/BANDAGES/DRESSINGS) ×1 IMPLANT
ELECT REM PT RETURN 9FT ADLT (ELECTROSURGICAL) ×2
ELECTRODE REM PT RTRN 9FT ADLT (ELECTROSURGICAL) ×1 IMPLANT
GLOVE BIO SURGEON STRL SZ7.5 (GLOVE) ×2 IMPLANT
GOWN STRL REUS W/ TWL LRG LVL3 (GOWN DISPOSABLE) ×2 IMPLANT
GOWN STRL REUS W/ TWL XL LVL3 (GOWN DISPOSABLE) ×1 IMPLANT
GOWN STRL REUS W/TWL LRG LVL3 (GOWN DISPOSABLE) ×4
GOWN STRL REUS W/TWL XL LVL3 (GOWN DISPOSABLE) ×2
KIT BASIN OR (CUSTOM PROCEDURE TRAY) ×2 IMPLANT
KIT TURNOVER KIT B (KITS) ×2 IMPLANT
NS IRRIG 1000ML POUR BTL (IV SOLUTION) ×2 IMPLANT
PACK CV ACCESS (CUSTOM PROCEDURE TRAY) ×2 IMPLANT
PAD ARMBOARD 7.5X6 YLW CONV (MISCELLANEOUS) ×4 IMPLANT
SUT MNCRL AB 4-0 PS2 18 (SUTURE) ×3 IMPLANT
SUT PROLENE 6 0 BV (SUTURE) ×2 IMPLANT
SUT VIC AB 3-0 SH 27 (SUTURE) ×2
SUT VIC AB 3-0 SH 27X BRD (SUTURE) ×1 IMPLANT
TOWEL GREEN STERILE (TOWEL DISPOSABLE) ×2 IMPLANT
UNDERPAD 30X36 HEAVY ABSORB (UNDERPADS AND DIAPERS) ×2 IMPLANT
WATER STERILE IRR 1000ML POUR (IV SOLUTION) ×2 IMPLANT

## 2020-05-13 NOTE — Discharge Instructions (Signed)
   [facility]  Discharge Instructions  AV Fistula or Graft Surgery for Dialysis Access  Please refer to the following instructions for your post-procedure care. Your surgeon or physician assistant will discuss any changes with you.  Activity  You may drive the day following your surgery, if you are comfortable and no longer taking prescription pain medication. Resume full activity as the soreness in your incision resolves.  Bathing/Showering  You may shower after you go home. Keep your incision dry for 48 hours. Do not soak in a bathtub, hot tub, or swim until the incision heals completely. You may not shower if you have a hemodialysis catheter.  Incision Care  Clean your incision with mild soap and water after 48 hours. Pat the area dry with a clean towel. You do not need a bandage unless otherwise instructed. Do not apply any ointments or creams to your incision. You may have skin glue on your incision. Do not peel it off. It will come off on its own in about one week. Your arm may swell a bit after surgery. To reduce swelling use pillows to elevate your arm so it is above your heart. Your doctor will tell you if you need to lightly wrap your arm with an ACE bandage.  Diet  Resume your normal diet. There are not special food restrictions following this procedure. In order to heal from your surgery, it is CRITICAL to get adequate nutrition. Your body requires vitamins, minerals, and protein. Vegetables are the best source of vitamins and minerals. Vegetables also provide the perfect balance of protein. Processed food has little nutritional value, so try to avoid this.  Medications  Resume taking all of your medications. If your incision is causing pain, you may take over-the counter pain relievers such as acetaminophen (Tylenol). If you were prescribed a stronger pain medication, please be aware these medications can cause nausea and constipation. Prevent  nausea by taking the medication with a snack or meal. Avoid constipation by drinking plenty of fluids and eating foods with high amount of fiber, such as fruits, vegetables, and grains.  Do not take Tylenol if you are taking prescription pain medications.  Follow up Your surgeon may want to see you in the office following your access surgery. If so, this will be arranged at the time of your surgery.  Please call us immediately for any of the following conditions:  . Increased pain, redness, drainage (pus) from your incision site . Fever of 101 degrees or higher . Severe or worsening pain at your incision site . Hand pain or numbness. .  Reduce your risk of vascular disease:  . Stop smoking. If you would like help, call [facility] at 1-800-QUIT-NOW 563-259-4608) or [facility] at 551-577-9762  . Manage your cholesterol . Maintain a desired weight . Control your diabetes . Keep your blood pressure down  Dialysis  It will take several weeks to several months for your new dialysis access to be ready for use. Your surgeon will determine when it is okay to use it. Your nephrologist will continue to direct your dialysis. You can continue to use your Permcath until your new access is ready for use.   05/13/2020 [name] [id number] May 18, 1960  Surgeon(s): Waynetta Sandy, MD  Procedure(s): FISTULA SUPERFICIALIZATION LEFT  x Do not stick fistula for 6 weeks    If you have any questions, please call the office at [phone].

## 2020-05-13 NOTE — Transfer of Care (Signed)
Immediate Anesthesia Transfer of Care Note  Patient: [name]  Procedure(s) Performed: FISTULA SUPERFICIALIZATION LEFT (Left )  Patient Location: PACU  Anesthesia Type:MAC  Level of Consciousness: drowsy and patient cooperative  Airway & Oxygen Therapy: Patient Spontanous Breathing and Patient connected to face mask oxygen  Post-op Assessment: Report given to RN and Post -op Vital signs reviewed and stable  Post vital signs: Reviewed  Last Vitals:  Vitals Value Taken Time  BP 100/70 05/13/20 0901  Temp    Pulse 56 05/13/20 0901  Resp 12 05/13/20 0901  SpO2 96 % 05/13/20 0901  Vitals shown include unvalidated device data.  Last Pain:  Vitals:   05/13/20 0643  TempSrc:   PainSc: 0-No pain      Patients Stated Pain Goal: 5 (65/99/35 7017)  Complications: No complications documented.

## 2020-05-13 NOTE — H&P (Signed)
HPI:  This is a 59 y.o. female who is s/p left RC AVF on 2.8.2022 by Dr. Donzetta Matters for CKD.     She has a history of diabetes, hypertension, hyperlipidemia. She denies any previous vascular disease does have known coronary artery disease. She is right-hand dominant. She denies any history of left upper extremity or chest or breast surgery. She has no previous pacemakers. She has never been on dialysis nor has she had family on dialysis.  Pt states she does not have pain/numbness in left hand.   She is not yet on HD and Dr. Carolin Sicks is her nephrologist.  She states she is seeing a nutritionist and has lost ~ 30lbs.   Her BP is elevated today, however, she states that at home, it normally runs 355'D systolic.        Allergies  Allergen Reactions  . Bupropion     Other reaction(s): Other Suicidal thoughts and increased depression  . Doxazosin Mesylate     Increased heart rate  . Rosuvastatin Other (See Comments)    myalgia  . Simvastatin     myalgias  . Wellbutrin [Bupropion Hcl]     unknown  . Zetia [Ezetimibe]     myalgias          Current Outpatient Medications  Medication Sig Dispense Refill  . albuterol (PROVENTIL HFA;VENTOLIN HFA) 108 (90 Base) MCG/ACT inhaler Inhale 2 puffs into the lungs every 4 (four) hours as needed. (Patient taking differently: Inhale 2 puffs into the lungs every 4 (four) hours as needed for wheezing or shortness of breath.) 1 Inhaler 1  . amLODipine (NORVASC) 10 MG tablet Take 1 tablet (10 mg total) by mouth daily. 90 tablet 3  . aspirin 81 MG tablet Take 81 mg by mouth at bedtime.    . carvedilol (COREG) 25 MG tablet TAKE 1 TABLET (25 MG TOTAL) BY MOUTH 2 (TWO) TIMES DAILY WITH A MEAL. TO REPLACE METOPROLOL 180 tablet 1  . cloNIDine (CATAPRES) 0.2 MG tablet TAKE 1 TABLET (0.2 MG TOTAL) BY MOUTH 2 (TWO) TIMES DAILY. 180 tablet 1  . Evolocumab (REPATHA SURECLICK) 322 MG/ML SOAJ Inject 140 mg into the skin every 14 (fourteen)  days. 2 mL 11  . hydrALAZINE (APRESOLINE) 100 MG tablet Take 1 tablet (100 mg total) by mouth 3 (three) times daily. 270 tablet 3  . HYDROcodone-acetaminophen (NORCO/VICODIN) 5-325 MG tablet Take 1 tablet by mouth every 4 (four) hours as needed for moderate pain. 10 tablet 0  . isosorbide mononitrate (IMDUR) 60 MG 24 hr tablet Take 60 mg by mouth daily.    Loma [place] Calcium 500 MG TABS Take 1 tablet by mouth 2 (two) times daily.     No current facility-administered medications for this visit.     ROS:  See HPI  Physical Exam:  Vitals:   05/13/20 0617  BP: (!) 130/58  Pulse: 63  Resp: 17  Temp: 97.9 F (36.6 C)  SpO2: 93%      Incision:  Healed nicely with small spitting stitch. Extremities:   There is a palpable left radial pulse.   Motor and sensory are in tact.   There is a thrill/bruit present.  The fistula is easily palpable distally, but more difficult to palpate proximally   Dialysis Duplex on 03/21/2020: Diameter:  0.50-0.67cm Depth:  0.36-1.36cm   Assessment/Plan:  This is a 60 y.o. female who is s/p: left RC AVF on 2.8.2022 by Dr. Donzetta Matters for CKD.      -  the pt does not have evidence of steal. -the fistula is deep in the proximal arm and will need superficialization.  I discussed this with the pt that there will most likely be two incisions to bring the fistula closer to the skin.  This will be scheduled at a time that is convenient for her and when Dr. Donzetta Matters has OR time available.  -she is on a baby asa daily -elevated BP today most likely related to her visit as she states her systolic is usually 211'Z at home.  She states that she has low blood pressure in the mornings and is working with her MD to adjust her medications accordingly.   Isaak Delmundo C. Donzetta Matters, MD [facility] [place] Office: [phone] Pager: [phone]

## 2020-05-13 NOTE — Anesthesia Procedure Notes (Signed)
Procedure Name: MAC Date/Time: 05/13/2020 7:45 AM Performed by: Janene Harvey, CRNA Pre-anesthesia Checklist: Patient identified, Emergency Drugs available, Suction available and Patient being monitored Patient Re-evaluated:Patient Re-evaluated prior to induction Oxygen Delivery Method: Simple face mask Induction Type: IV induction Placement Confirmation: positive ETCO2 Dental Injury: Teeth and Oropharynx as per pre-operative assessment

## 2020-05-13 NOTE — Op Note (Signed)
    Patient name: [name] MRN: [id number] DOB: 1960/02/11 Sex: female  05/13/2020 Pre-operative Diagnosis: ckd Post-operative diagnosis:  Same Surgeon:  Erlene Quan C. Donzetta Matters, MD Procedure Performed:  Left arm cephalic vein AV fistula revision with transposition  Indications: 60 year old female with history of chronic kidney disease.  She has a previous radial artery at the antecubital to cephalic vein AV fistula creation.  This fistula has significant flow however is somewhat deep and there are multiple branches.  Is now indicated for the above-noted procedure.  Findings: There were multiple small branches that were divided.  The vein was actually quite tortuous this was straightened out and tunneled laterally.  At completion there was a very strong thrill in the fistula and a palpable radial artery pulse at the wrist both confirmed with Doppler.   Procedure:  The patient was identified in the holding area and taken to the operating where she is placed supine on upper table and MAC anesthesia induced.  She was gently prepped draped in left upper extremity usual fashion, antibiotics were minister timeout was called.  We anesthetized the expected incision site and tunnel site with 1% lidocaine with epinephrine.  An incision was then made above the antecubitum.  We dissected out the fistula dividing branches between clips and ties.  We made a second incision higher in the arm again dissected out the entire to the fistula dividing the branches.  When the entire fistula was mobilized we marked it for orientation.  We clamped near the antecubitum and transected.  We then flushed with heparinized saline tunneled laterally spatulated both ends and sewed end-to-end with 6-0 Prolene suture.  Prior completion of flushing all directions.  Upon completion there is very strong thrill in the fistula.  There was a palpable radial artery pulse at the wrist.  Both of these were confirmed with Doppler.  We irrigated the  wounds and obtain hemostasis.  We closed with 4-0 Monocryl only overlying the fistula sites.  Dermabond was placed at the level of skin.  She was awakened from anesthesia having tolerated procedure well without any complication.  Counts were correct at completion.   EBL: 50cc  Buford Bremer C. Donzetta Matters, MD [facility] Office: [phone] Pager: [phone]

## 2020-05-13 NOTE — Anesthesia Postprocedure Evaluation (Signed)
Anesthesia Post Note  Patient: [name]  Procedure(s) Performed: FISTULA SUPERFICIALIZATION LEFT (Left )     Patient location during evaluation: PACU Anesthesia Type: MAC Level of consciousness: awake and alert Pain management: pain level controlled Vital Signs Assessment: post-procedure vital signs reviewed and stable Respiratory status: spontaneous breathing and respiratory function stable Cardiovascular status: stable Postop Assessment: no apparent nausea or vomiting Anesthetic complications: no   No complications documented.  Last Vitals:  Vitals:   05/13/20 0919 05/13/20 0934  BP: (!) 123/58 131/63  Pulse: (!) 56 (!) 57  Resp: 14 17  Temp:  (!) 36.2 C  SpO2: 91% 92%    Last Pain:  Vitals:   05/13/20 0934  TempSrc:   PainSc: 4                  Candra R [place]ath[place] Every

## 2020-05-14 ENCOUNTER — Encounter (HOSPITAL_COMMUNITY): Payer: Self-pay | Admitting: Vascular Surgery

## 2020-05-31 ENCOUNTER — Other Ambulatory Visit: Payer: Self-pay | Admitting: Cardiovascular Disease

## 2020-06-02 ENCOUNTER — Other Ambulatory Visit: Payer: Self-pay | Admitting: Cardiovascular Disease

## 2020-06-02 NOTE — Telephone Encounter (Signed)
Rx(s) sent to pharmacy electronically.  

## 2020-06-13 ENCOUNTER — Ambulatory Visit (INDEPENDENT_AMBULATORY_CARE_PROVIDER_SITE_OTHER): Payer: Managed Care, Other (non HMO) | Admitting: Physician Assistant

## 2020-06-13 ENCOUNTER — Encounter: Payer: Self-pay | Admitting: Physician Assistant

## 2020-06-13 ENCOUNTER — Other Ambulatory Visit: Payer: Self-pay

## 2020-06-13 VITALS — BP 161/71 | HR 63 | Temp 98.0°F | Resp 20 | Ht 64.0 in | Wt 186.3 lb

## 2020-06-13 DIAGNOSIS — N184 Chronic kidney disease, stage 4 (severe): Secondary | ICD-10-CM

## 2020-06-13 NOTE — Progress Notes (Signed)
  POST OPERATIVE OFFICE NOTE    CC:  F/u for surgery  HPI:  This is a 60 y.o. female who is s/p Left arm cephalic vein AV fistula revision with transposition  on 05/13/20 by Dr. Donzetta Matters.    Pt returns today for follow up.  Pt denise pain, loss of motor or sensation.  She is not on HD at this time.  Allergies  Allergen Reactions  . Bupropion     Other reaction(s): Other Suicidal thoughts and increased depression  . Doxazosin Mesylate     Increased heart rate  . Rosuvastatin Other (See Comments)    myalgia  . Simvastatin     myalgias  . Wellbutrin [Bupropion Hcl]     unknown  . Zetia [Ezetimibe]     myalgias    Current Outpatient Medications  Medication Sig Dispense Refill  . albuterol (PROVENTIL HFA;VENTOLIN HFA) 108 (90 Base) MCG/ACT inhaler Inhale 2 puffs into the lungs every 4 (four) hours as needed. (Patient taking differently: Inhale 2 puffs into the lungs every 4 (four) hours as needed for wheezing or shortness of breath.) 1 Inhaler 1  . amLODipine (NORVASC) 10 MG tablet Take 1 tablet (10 mg total) by mouth daily. 90 tablet 3  . aspirin 81 MG tablet Take 81 mg by mouth at bedtime.    . carvedilol (COREG) 25 MG tablet TAKE 1 TABLET (25 MG TOTAL) BY MOUTH 2 (TWO) TIMES DAILY WITH A MEAL. TO REPLACE METOPROLOL 180 tablet 1  . Cholecalciferol (VITAMIN D3) 50 MCG (2000 UT) TABS Take 2,000 Units by mouth daily.    . cloNIDine (CATAPRES) 0.2 MG tablet TAKE 1 TABLET BY MOUTH 2 TIMES DAILY. 180 tablet 1  . [facility] IRON 325 (65 Fe) MG tablet Take 325 mg by mouth daily.    . Evolocumab (REPATHA SURECLICK) 010 MG/ML SOAJ Inject 140 mg into the skin every 14 (fourteen) days. 2 mL 11  . hydrALAZINE (APRESOLINE) 100 MG tablet Take 1 tablet (100 mg total) by mouth 3 (three) times daily. 270 tablet 3  . HYDROcodone-acetaminophen (NORCO) 5-325 MG tablet Take 1 tablet by mouth every 6 (six) hours as needed for moderate pain. 12 tablet 0  . Oyster Shell Calcium 500 MG TABS Take 500 mg by mouth 2  (two) times daily.    . sodium bicarbonate 650 MG tablet Take 1,300 mg by mouth 2 (two) times daily.     No current facility-administered medications for this visit.     ROS:  See HPI  Physical Exam:    Incision:  Healing incisions without erythema or edema. Extremities:  Grip 5/5 B UE, sensation and motor intact.  Lungs: non labored breathing     Assessment/Plan:  This is a 60 y.o. female who is s/p:Left arm cephalic vein AV fistula revision with transposition  The original fistula creation was 02/19/20.  The fistula may be accessed at this time.   She will f/u PRN     Roxy Horseman PA-C [facility] [phone]   Clinic MD:  Donzetta Matters

## 2020-08-10 ENCOUNTER — Other Ambulatory Visit: Payer: Self-pay

## 2020-08-10 ENCOUNTER — Ambulatory Visit
Admission: EM | Admit: 2020-08-10 | Discharge: 2020-08-10 | Disposition: A | Discharge status: Home / Self Care | Payer: Managed Care, Other (non HMO) | Attending: Physician Assistant | Admitting: Physician Assistant

## 2020-08-10 DIAGNOSIS — R350 Frequency of micturition: Secondary | ICD-10-CM | POA: Diagnosis not present

## 2020-08-10 DIAGNOSIS — R030 Elevated blood-pressure reading, without diagnosis of hypertension: Secondary | ICD-10-CM | POA: Diagnosis not present

## 2020-08-10 DIAGNOSIS — R310 Gross hematuria: Secondary | ICD-10-CM | POA: Diagnosis not present

## 2020-08-10 LAB — POCT URINALYSIS DIP (MANUAL ENTRY)
Bilirubin, UA: NEGATIVE
Glucose, UA: NEGATIVE mg/dL
Ketones, POC UA: NEGATIVE mg/dL
Nitrite, UA: NEGATIVE
Protein Ur, POC: >=300 mg/dL — AB
Spec Grav, UA: 1.02 (ref 1.010–1.025)
Urobilinogen, UA: 0.2 E.U./dL
pH, UA: 6 (ref 5.0–8.0)

## 2020-08-10 MED ORDER — CEPHALEXIN 500 MG PO CAPS: 500.0000 mg | ORAL_CAPSULE | Freq: Three times a day (TID) | ORAL | 0 refills | Status: DC

## 2020-08-10 NOTE — ED Triage Notes (Signed)
Pt c/o frank blood in urine starting last night that continued into today though not as much as last night. Denies pain or discomfort but states voiding more frequently and has hx of CKD.

## 2020-08-10 NOTE — Discharge Instructions (Addendum)
We are going to treat you for urinary tract infection.  Please take Keflex 500 mg 3 times a day.  We will contact you if your lab work is abnormal.  We will contact you if we need to change antibiotics based on your urine culture.  Please follow-up with urologist as we discussed.  Your blood pressure is very elevated.  Please go home and take your hydralazine.  If you develop any chest pain, shortness of breath, leg swelling, headache, dizziness you need to go to the emergency room.  Monitor your blood pressure and if this remains elevated he needs to be reevaluated.

## 2020-08-10 NOTE — ED Provider Notes (Signed)
[name]    CSN: [id number] Arrival date & time: 08/10/20  1432      History   Chief Complaint Chief Complaint  Patient presents with   Hematuria    HPI [name] is a 60 y.o. female.   Patient presents today with a several hour history of frank hematuria.  Reports that last night she noticed significant hematuria which is gradually been improving but is not yet resolved.  She does report some urinary frequency but states this is not uncommon and unchanged from baseline.  She denies any history of nephrolithiasis or bleeding disorder.  She does take aspirin daily but does not take any additional blood thinning medications.  She does have a history of polycystic kidney disease and is followed by urology but has not seen them recently.  She is a smoker but denies heavy metal exposure or other risk factors for bladder cancer.  She denies any unintentional weight loss or additional symptoms.   Past Medical History:  Diagnosis Date   Angina    CAD (coronary artery disease), non obstructive CAD in 2010    a. 2013 Cath: LAD 60, RCA 44m, 70d; b. 2014 Cath: stable anatomy.   Chronic kidney disease    polycystic kidney disease , elevated creatine   COPD (chronic obstructive pulmonary disease) ([place])    Diabetes mellitus without complication ([place])    a. 12/2014 HbA1c 6.5; b. Rx Glipizide - not taking.   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 anterior ischemia 04/15/2011   Essential hypertension 04/15/2011   Dyslipidemia- LDL goal < 70 97/41/6384   Metabolic syndrome 53/64/6803   Tobacco abuse 04/15/2011   Family history of premature CAD 04/15/2011    Past Surgical History:  Procedure Laterality Date   APPENDECTOMY  1974   AV FISTULA PLACEMENT Left 02/19/2020   Procedure: LEFT ARM BRACHIOCEPHALIC  ARTERIOVENOUS (AV) FISTULA CREATION;  Surgeon: Waynetta Sandy, MD;  Location: [facility];  Service: Vascular;  Laterality: Left;   CARDIAC CATHETERIZATION  02/07/2008   Recommendation-F/U stress test   CARDIAC CATHETERIZATION  04/16/2011   Recommendation-Increase medical therapy trial   CARDIOVASCULAR STRESS TEST  03/25/2011   Suspect subtle anterior septal ischemia   COLONOSCOPY     FISTULA SUPERFICIALIZATION Left 05/13/2020   Procedure: FISTULA SUPERFICIALIZATION LEFT;  Surgeon: Waynetta Sandy, MD;  Location:  [facility] OR;  Service: Vascular;  Laterality: Left;   FRACTURE SURGERY Left 1972   forearm fracture   LEFT HEART CATHETERIZATION WITH CORONARY ANGIOGRAM N/A 04/16/2011   Procedure: LEFT HEART CATHETERIZATION WITH CORONARY ANGIOGRAM;  Surgeon: Troy Sine, MD;  Location: [facility] CATH LAB;  Service: Cardiovascular;  Laterality: N/A;   LEFT HEART CATHETERIZATION WITH CORONARY ANGIOGRAM N/A 09/01/2012   Procedure: LEFT HEART CATHETERIZATION WITH CORONARY ANGIOGRAM;  Surgeon: Pixie Casino, MD;  Location: [facility] CATH LAB;  Service: Cardiovascular;  Laterality: N/A;   POLYPECTOMY     RENAL DOPPLER  06/05/2009   No evidence of significant diameter reduction   TRANSTHORACIC ECHOCARDIOGRAM  01/25/2008   EF 60-65%, Moderate concentric LVH   WRIST FRACTURE SURGERY Right ~ 1996    OB History   No obstetric history on file.      Home Medications    Prior to Admission medications   Medication Sig Start Date End Date Taking? Authorizing Provider  cephALEXin (KEFLEX) 500 MG capsule Take 1 capsule (500 mg total) by mouth 3 (three) times daily. 08/10/20  Yes [place] Valli K, PA-C  albuterol (PROVENTIL HFA;VENTOLIN HFA) 108 (90 Base) MCG/ACT inhaler Inhale 2 puffs into the lungs every 4 (four) hours as needed. Patient taking differently: Inhale 2 puffs into the lungs every 4 (four) hours as needed for wheezing or shortness of breath. 08/23/17   Jaynee Eagles, PA-C  amLODipine (NORVASC) 10 MG tablet Take 1 tablet (10 mg total) by mouth daily. 09/14/19   Lorretta Harp, MD  aspirin 81 MG tablet Take 81 mg by mouth at bedtime.    [provider]  carvedilol (COREG) 25 MG tablet TAKE 1 TABLET (25 MG TOTAL) BY MOUTH 2 (TWO) TIMES DAILY WITH A MEAL. TO REPLACE METOPROLOL 06/03/20   Lorretta Harp, MD  Cholecalciferol (VITAMIN D3) 50 MCG (2000 UT) TABS Take 2,000 Units by mouth daily.    [provider]  cloNIDine (CATAPRES) 0.2 MG tablet TAKE 1 TABLET BY MOUTH 2 TIMES DAILY. 06/02/20   Troy Sine, MD  [facility]S IRON 325 (65 Fe) MG tablet Take 325 mg by mouth daily. 04/04/20   [provider]  Evolocumab (REPATHA SURECLICK) 570 MG/ML SOAJ Inject 140 mg into the skin every 14 (fourteen) days. 10/04/19   Lorretta Harp, MD  hydrALAZINE (APRESOLINE) 100 MG tablet Take 1 tablet (100 mg total) by mouth 3 (three) times daily. 09/14/19   Lorretta Harp, MD  HYDROcodone-acetaminophen (NORCO) 5-325 MG tablet Take 1 tablet by mouth every 6 (six) hours as needed for moderate  pain. 05/13/20   Rhyne, Hulen Shouts, PA-C  Oyster Shell Calcium 500 MG TABS Take 500 mg by mouth 2 (two) times daily. 04/18/19   [provider]  sodium bicarbonate 650 MG tablet Take 1,300 mg by mouth 2 (two) times daily. 04/04/20   [provider]    Family History Family History  Problem Relation Age of Onset   Stroke Father    Hypertension Father    Coronary artery disease Brother    Diabetes Mother    Stroke Maternal Grandmother    Cancer Maternal Grandmother        Breast cancer   Hypertension Maternal Grandfather    Heart attack Maternal Grandfather    Heart attack Maternal Aunt 41   Colon cancer Neg Hx    Esophageal cancer Neg Hx    Stomach cancer Neg Hx    Rectal cancer Neg Hx  Social History Social History   Tobacco Use   Smoking status: Some Days    Packs/day: 0.25    Years: 30.00    Pack years: 7.50    Types: Cigarettes    Last attempt to quit: 09/2018    Years since quitting: 1.9   Smokeless tobacco: Never   Tobacco comments:    2-  weeks  Vaping Use   Vaping Use: Never used  Substance Use Topics   Alcohol use: No    Alcohol/week: 0.0 standard drinks   Drug use: No     Allergies   Bupropion, Doxazosin mesylate, Rosuvastatin, Simvastatin, Wellbutrin [bupropion hcl], and Zetia [ezetimibe]   Review of Systems Review of Systems  Constitutional:  Negative for activity change, appetite change, fatigue and fever.  Respiratory:  Negative for cough and shortness of breath.   Cardiovascular:  Negative for chest pain.  Gastrointestinal:  Negative for abdominal pain, diarrhea, nausea and vomiting.  Genitourinary:  Positive for frequency and hematuria. Negative for dysuria, flank pain, urgency, vaginal bleeding, vaginal discharge and vaginal pain.  Musculoskeletal:  Negative for arthralgias, back pain and myalgias.  Neurological:  Negative for dizziness, light-headedness and headaches.    Physical Exam Triage Vital Signs ED Triage Vitals  [08/10/20 1445]  Enc Vitals Group     BP (!) 171/73     Pulse Rate 67     Resp 18     Temp 97.6 F (36.4 C)     Temp Source Oral     SpO2 92 %     Weight      Height      Head Circumference      Peak Flow      Pain Score 0     Pain Loc      Pain Edu?      Excl. in [place]?    No data found.  Updated Vital Signs BP (!) 189/83 (BP Location: Left Arm)   Pulse 67   Temp 97.6 F (36.4 C) (Oral)   Resp 16   LMP 12/14/2013   SpO2 93%   Visual Acuity Right Eye Distance:   Left Eye Distance:   Bilateral Distance:    Right Eye Near:   Left Eye Near:    Bilateral Near:     Physical Exam Vitals reviewed.  Constitutional:      General: She is awake. She is not in acute distress.    Appearance: Normal appearance. She is normal weight. She is not ill-appearing.     Comments: Very pleasant female appears stated age in no acute distress  HENT:     Head: Normocephalic and atraumatic.  Cardiovascular:     Rate and Rhythm: Normal rate and regular rhythm.     Heart sounds: Normal heart sounds, S1 normal and S2 normal. No murmur heard. Pulmonary:     Effort: Pulmonary effort is normal.     Breath sounds: Normal breath sounds. No wheezing, rhonchi or rales.     Comments: Clear to auscultation bilaterally Abdominal:     General: Bowel sounds are normal.     Palpations: Abdomen is soft.     Tenderness: There is no abdominal tenderness. There is no right CVA tenderness, left CVA tenderness, guarding or rebound.     Comments: Benign abdominal exam  Psychiatric:        Behavior: Behavior is cooperative.     UC Treatments / Results  Labs (all labs ordered are listed, but only abnormal results are displayed)  Labs Reviewed  POCT URINALYSIS DIP (MANUAL ENTRY) - Abnormal; Notable for the following components:      Result Value   Color, UA orange (*)    Clarity, UA cloudy (*)    Blood, UA large (*)    Protein Ur, POC >=300 (*)    Leukocytes, UA Trace (*)    All other components  within normal limits  URINE CULTURE  CBC WITH DIFFERENTIAL/PLATELET  COMPREHENSIVE METABOLIC PANEL  PROTIME-INR  APTT    EKG   Radiology No results found.  Procedures Procedures (including critical care time)  Medications Ordered in UC Medications - No data to display  Initial Impression / Assessment and Plan / UC Course  I have reviewed the triage vital signs and the nursing notes.  Pertinent labs & imaging results that were available during my care of the patient were reviewed by me and considered in my medical decision making (see chart for details).      UA showed gross hematuria.  We will treat empirically with Keflex 500 mg 3 times a day.  Urine culture was obtained-results pending.  Discussed potential need to change or alter medication course based on culture results.  Discussed the importance of following up with urologist to ensure there is not a different underlying condition causing symptoms.  Basic labs including CBC, CMP, bleeding studies obtained-results pending.  Discussed alarm symptoms that warrant emergent evaluation.  Strict return precautions given to which patient expressed understanding.  Patient's blood pressure was significantly elevated.  Patient reports that she is due for her hydralazine dose and has not yet taken this today.  She denies any chest pain, shortness of breath, leg swelling, heart racing, dizziness, vision changes.  She does have a blood pressure monitoring device at home and will monitor her blood pressure when she returns home and if this persistently is elevated she will return for reevaluation.  Discussed alarm symptoms that warrant going to the emergency room.  Strict return precautions given to which patient expressed understanding.  Final Clinical Impressions(s) / UC Diagnoses   Final diagnoses:  Gross hematuria  Urinary frequency  Elevated blood pressure reading     Discharge Instructions      We are going to treat you for  urinary tract infection.  Please take Keflex 500 mg 3 times a day.  We will contact you if your lab work is abnormal.  We will contact you if we need to change antibiotics based on your urine culture.  Please follow-up with urologist as we discussed.  Your blood pressure is very elevated.  Please go home and take your hydralazine.  If you develop any chest pain, shortness of breath, leg swelling, headache, dizziness you need to go to the emergency room.  Monitor your blood pressure and if this remains elevated he needs to be reevaluated.     ED Prescriptions     Medication Sig Dispense Auth. Provider   cephALEXin (KEFLEX) 500 MG capsule Take 1 capsule (500 mg total) by mouth 3 (three) times daily. 21 capsule Detravion Tester K, PA-C      PDMP not reviewed this encounter.   Terrilee Croak, PA-C 08/10/20 1514

## 2020-08-11 LAB — APTT: aPTT: 31 s (ref 24–33)

## 2020-08-11 LAB — PROTIME-INR
INR: 1 (ref 0.9–1.2)
Prothrombin Time: 10.9 s (ref 9.1–12.0)

## 2020-08-11 LAB — CBC WITH DIFFERENTIAL/PLATELET
Basophils Absolute: 0.1 10*3/uL (ref 0.0–0.2)
Basos: 1 %
EOS (ABSOLUTE): 0.5 10*3/uL — ABNORMAL HIGH (ref 0.0–0.4)
Eos: 6 %
Hematocrit: 33.8 % — ABNORMAL LOW (ref 34.0–46.6)
Hemoglobin: 11.1 g/dL (ref 11.1–15.9)
Immature Grans (Abs): 0 10*3/uL (ref 0.0–0.1)
Immature Granulocytes: 0 %
Lymphocytes Absolute: 2 10*3/uL (ref 0.7–3.1)
Lymphs: 24 %
MCH: 28.5 pg (ref 26.6–33.0)
MCHC: 32.8 g/dL (ref 31.5–35.7)
MCV: 87 fL (ref 79–97)
Monocytes Absolute: 0.5 10*3/uL (ref 0.1–0.9)
Monocytes: 6 %
Neutrophils Absolute: 5.3 10*3/uL (ref 1.4–7.0)
Neutrophils: 63 %
Platelets: 226 10*3/uL (ref 150–450)
RBC: 3.89 x10E6/uL (ref 3.77–5.28)
RDW: 13.5 % (ref 11.7–15.4)
WBC: 8.4 10*3/uL (ref 3.4–10.8)

## 2020-08-11 LAB — COMPREHENSIVE METABOLIC PANEL
ALT: 9 IU/L (ref 0–32)
AST: 10 IU/L (ref 0–40)
Albumin/Globulin Ratio: 1.6 (ref 1.2–2.2)
Albumin: 4.1 g/dL (ref 3.8–4.9)
Alkaline Phosphatase: 43 IU/L — ABNORMAL LOW (ref 44–121)
BUN/Creatinine Ratio: 10 (ref 9–23)
BUN: 62 mg/dL — ABNORMAL HIGH (ref 6–24)
Bilirubin Total: 0.2 mg/dL (ref 0.0–1.2)
CO2: 19 mmol/L — ABNORMAL LOW (ref 20–29)
Calcium: 8.8 mg/dL (ref 8.7–10.2)
Chloride: 102 mmol/L (ref 96–106)
Creatinine, Ser: 6.07 mg/dL — ABNORMAL HIGH (ref 0.57–1.00)
Globulin, Total: 2.5 g/dL (ref 1.5–4.5)
Glucose: 141 mg/dL — ABNORMAL HIGH (ref 65–99)
Potassium: 3.4 mmol/L — ABNORMAL LOW (ref 3.5–5.2)
Sodium: 140 mmol/L (ref 134–144)
Total Protein: 6.6 g/dL (ref 6.0–8.5)
eGFR: 8 mL/min/{1.73_m2} — ABNORMAL LOW (ref >59)

## 2020-08-12 LAB — URINE CULTURE

## 2020-08-13 DIAGNOSIS — I119 Hypertensive heart disease without heart failure: Secondary | ICD-10-CM | POA: Insufficient documentation

## 2020-08-13 DIAGNOSIS — E119 Type 2 diabetes mellitus without complications: Secondary | ICD-10-CM | POA: Insufficient documentation

## 2020-09-29 ENCOUNTER — Other Ambulatory Visit: Payer: Self-pay | Admitting: Cardiovascular Disease

## 2021-01-09 ENCOUNTER — Other Ambulatory Visit: Payer: Self-pay

## 2021-01-09 DIAGNOSIS — I119 Hypertensive heart disease without heart failure: Secondary | ICD-10-CM

## 2021-01-09 DIAGNOSIS — E785 Hyperlipidemia, unspecified: Secondary | ICD-10-CM

## 2021-01-26 ENCOUNTER — Other Ambulatory Visit: Payer: Self-pay | Admitting: Cardiovascular Disease

## 2021-03-02 ENCOUNTER — Other Ambulatory Visit: Payer: Self-pay | Admitting: Cardiovascular Disease

## 2021-03-14 ENCOUNTER — Other Ambulatory Visit: Payer: Self-pay | Admitting: Cardiovascular Disease

## 2021-03-30 ENCOUNTER — Other Ambulatory Visit: Payer: Self-pay | Admitting: Cardiovascular Disease

## 2021-04-24 DIAGNOSIS — N2581 Secondary hyperparathyroidism of renal origin: Secondary | ICD-10-CM | POA: Diagnosis not present

## 2021-04-24 DIAGNOSIS — N185 Chronic kidney disease, stage 5: Secondary | ICD-10-CM | POA: Diagnosis not present

## 2021-04-24 DIAGNOSIS — D631 Anemia in chronic kidney disease: Secondary | ICD-10-CM | POA: Diagnosis not present

## 2021-04-24 DIAGNOSIS — I120 Hypertensive chronic kidney disease with stage 5 chronic kidney disease or end stage renal disease: Secondary | ICD-10-CM | POA: Diagnosis not present

## 2021-04-27 ENCOUNTER — Other Ambulatory Visit: Payer: Self-pay | Admitting: Cardiovascular Disease

## 2021-04-28 ENCOUNTER — Telehealth: Payer: Self-pay

## 2021-04-28 MED ORDER — AMLODIPINE BESYLATE 10 MG PO TABS: 10.0000 mg | ORAL_TABLET | Freq: Every day | ORAL | 0 refills | Status: DC

## 2021-04-28 NOTE — Telephone Encounter (Signed)
Received a refill request for Amlodipine 10 mg daily from patient's pharmacy. Called and left a voice message for the patient informing her that our office received a refill request for her Amlodipine and that I was also calling to get her scheduled for a follow up appointment. Asked her to give office a call back to schedule an appointment and that I will be sending  15 tablets to her pharmacy.  ?

## 2021-04-28 NOTE — Telephone Encounter (Addendum)
Left a voice message for the patient informing her that our office received a refill request for her Amlodipine and that I was also calling to get her scheduled for a follow up appointment. Asked her to give office a call back to schedule an appointment and that I will be sending  15 tablets to her pharmacy.  ?

## 2021-05-07 ENCOUNTER — Encounter: Payer: Self-pay | Admitting: Cardiovascular Disease

## 2021-05-07 ENCOUNTER — Ambulatory Visit (INDEPENDENT_AMBULATORY_CARE_PROVIDER_SITE_OTHER): Payer: BC Managed Care – PPO | Admitting: Cardiovascular Disease

## 2021-05-07 DIAGNOSIS — E785 Hyperlipidemia, unspecified: Secondary | ICD-10-CM | POA: Diagnosis not present

## 2021-05-07 DIAGNOSIS — I2510 Atherosclerotic heart disease of native coronary artery without angina pectoris: Secondary | ICD-10-CM

## 2021-05-07 DIAGNOSIS — G4733 Obstructive sleep apnea (adult) (pediatric): Secondary | ICD-10-CM | POA: Diagnosis not present

## 2021-05-07 DIAGNOSIS — I10 Essential (primary) hypertension: Secondary | ICD-10-CM | POA: Diagnosis not present

## 2021-05-07 NOTE — Assessment & Plan Note (Signed)
History of nonobstructive CAD by cath in 2010, 2013, 2014.  She denies chest pain or shortness of breath. ?

## 2021-05-07 NOTE — Assessment & Plan Note (Signed)
History of ongoing tobacco abuse smoking 1 pack every 2 weeks. ?

## 2021-05-07 NOTE — Assessment & Plan Note (Signed)
History of dyslipidemia intolerant to statin therapy with lipid profile performed 10/01/2018 revealing total cholesterol 183, LDL 110 and HDL 29. ?

## 2021-05-07 NOTE — Progress Notes (Signed)
? ? ? ?05/07/2021 ?[name]   ?1960-01-22  ?[id number] ? ?Primary Physician Patient, No Pcp Per (Inactive) ?Primary Cardiologist: Lorretta Harp MD [facility], [place], [place] ? ?HPI:  [name] is a 61 y.o.  moderately overweight female who I last saw in the office 09/14/2019.  She presented with a past medical history significant for CAD by cath in 2010 and in April 2013, and 2014.Marland Kitchen She had an abnormal Myoview suggesting anterior ischemia prior to her repeat cath in 2013. Cath has shown 60% LAD stenosis, 50% mid RCA stenosis, 70%, distal RCA stenosis with normal LV function. She has not seen [place] as an OP. 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 She denies vomiting, SOB, or diaphoresis. She denies any abdominal pain or bloating. She went to Urgent Care and was told they could not rule out a heart attack based on her EKG. She was transferred to [facility] ER by ambulance. NTG helped her symptoms en route and she has not had recurrence of chest pain. Her EKG here is normal with NSST changes and her Troponin is negative X 3. She underwent cardiac catheterization by Dr. Debara Pickett on 09/01/12 revealing unchanged anatomy from her prior catheter because of 13 with a 60% RCA lesion and otherwise scattered noncritical disease with normal LV function. Medical therapy was recommended. She has had no recurrent symptoms.  ?  ?Since I saw her in the office a year and a half ago she continues to do well.  Her creatinines are up in the 7 range.  She did have an AV fistula placed in anticipation of dialysis by Dr. Donzetta Matters.  She denies chest pain or shortness of breath.  Her blood pressure at home runs in the 130/80 range. ?  ? ? ?Current Meds  ?Medication Sig  ? amLODipine (NORVASC) 10 MG tablet Take 1 tablet (10 mg total) by mouth daily. Please schedule an appointment for further refills. 1ST ATTEMPT.  ? aspirin 81 MG tablet Take 81 mg by mouth at bedtime.  ?  carvedilol (COREG) 25 MG tablet TAKE 1 TABLET (25 MG TOTAL) BY MOUTH 2 (TWO) TIMES DAILY WITH A MEAL. TO REPLACE METOPROLOL  ? Cholecalciferol (VITAMIN D3) 50 MCG (2000 UT) TABS Take 2,000 Units by mouth daily.  ? cloNIDine (CATAPRES) 0.2 MG tablet Take 1 tablet (0.2 mg total) by mouth 2 (two) times daily. Appointment needed  ? [employer] IRON 325 (65 Fe) MG tablet Take 325 mg by mouth daily.  ? hydrALAZINE (APRESOLINE) 50 MG tablet Take 50 mg by mouth 2 (two) times daily.  ? Oyster Shell Calcium 500 MG TABS Take 500 mg by mouth 2 (two) times daily.  ? sevelamer carbonate (RENVELA) 800 MG tablet Take 800 mg by mouth 3 (three) times daily.  ? sodium bicarbonate 650 MG tablet Take 1,300 mg by mouth 2 (two) times daily.  ?  ? ?Allergies  ?Allergen Reactions  ? Bupropion   ?  Other reaction(s): Other ?Suicidal thoughts and increased depression  ? Doxazosin Mesylate   ?  Increased heart rate  ? Rosuvastatin Other (See Comments)  ?  myalgia  ? Simvastatin   ?  myalgias  ? Wellbutrin [Bupropion Hcl]   ?  unknown  ? Zetia [Ezetimibe]   ?  myalgias  ? ? ?Social History  ? ?Socioeconomic History  ? Marital status: Divorced  ?  Spouse name: Not on file  ? Number of children: 1  ?  Years of education: Not on file  ? Highest education level: Not on file  ?Occupational History  ? Occupation: Payroll  ?  Employer: [employer]  ?Tobacco Use  ? Smoking status: Some Days  ?  Packs/day: 0.25  ?  Years: 30.00  ?  Pack years: 7.50  ?  Types: Cigarettes  ?  Last attempt to quit: 09/2018  ?  Years since quitting: 2.6  ? Smokeless tobacco: Never  ? Tobacco comments:  ?  2-  weeks  ?Vaping Use  ? Vaping Use: Never used  ?Substance and Sexual Activity  ? Alcohol use: No  ?  Alcohol/week: 0.0 standard drinks  ? Drug use: No  ? Sexual activity: Not Currently  ?  Partners: Male  ?Other Topics Concern  ? Not on file  ?Social History Narrative  ? Not on file  ? ?Social Determinants of Health  ? ?Financial Resource Strain: Not on file  ?Food  Insecurity: Not on file  ?Transportation Needs: Not on file  ?Physical Activity: Not on file  ?Stress: Not on file  ?Social Connections: Not on file  ?Intimate Partner Violence: Not on file  ?  ? ?Review of Systems: ?General: negative for chills, fever, night sweats or weight changes.  ?Cardiovascular: negative for chest pain, dyspnea on exertion, edema, orthopnea, palpitations, paroxysmal nocturnal dyspnea or shortness of breath ?Dermatological: negative for rash ?Respiratory: negative for cough or wheezing ?Urologic: negative for hematuria ?Abdominal: negative for nausea, vomiting, diarrhea, bright red blood per rectum, melena, or hematemesis ?Neurologic: negative for visual changes, syncope, or dizziness ?All other systems reviewed and are otherwise negative except as noted above. ? ? ? ?Blood pressure (!) 250/100, pulse 79, height '5\' 4"'$  (1.626 m), weight 197 lb 6.4 oz (89.5 kg), last menstrual period 12/14/2013, SpO2 98 %.  ?General appearance: alert and no distress ?Neck: no adenopathy, no JVD, supple, symmetrical, trachea midline, thyroid not enlarged, symmetric, no tenderness/mass/nodules, and left carotid bruit most likely related to her AV fistula ?Lungs: clear to auscultation bilaterally ?Heart: regular rate and rhythm, S1, S2 normal, no murmur, click, rub or gallop ?Extremities: extremities normal, atraumatic, no cyanosis or edema ?Pulses: 2+ and symmetric ?Skin: Skin color, texture, turgor normal. No rashes or lesions ?Neurologic: Grossly normal ? ?EKG sinus rhythm at 79 with lateral T wave inversion in leads I and L.  I personally reviewed this EKG. ? ?ASSESSMENT AND PLAN:  ? ?CAD (coronary artery disease) ?History of nonobstructive CAD by cath in 2010, 2013, 2014.  She denies chest pain or shortness of breath. ? ?Essential hypertension ?History of essential hypertension a blood pressure measured today at 250/102.  She says normally her blood pressure at home is in the 130/80 range.  She is on  amlodipine, carvedilol and hydralazine. ? ?Dyslipidemia- LDL goal < 70 ?History of dyslipidemia intolerant to statin therapy with lipid profile performed 10/01/2018 revealing total cholesterol 183, LDL 110 and HDL 29. ? ?Tobacco abuse ?History of ongoing tobacco abuse smoking 1 pack every 2 weeks. ? ?Sleep apnea ?History of sleep apnea although she never pursued a sleep study. ? ? ? ? ?Lorretta Harp MD FACP,FACC,FAHA, FSCAI ?05/07/2021 ?3:01 PM ?

## 2021-05-07 NOTE — Patient Instructions (Signed)
Medication Instructions:  ?Your physician recommends that you continue on your current medications as directed. Please refer to the Current Medication list given to you today.  ?*If you need a refill on your cardiac medications before your next appointment, please call your pharmacy* ? ? ?Lab Work: ?None ?If you have labs (blood work) drawn today and your tests are completely normal, you will receive your results only by: ?MyChart Message (if you have MyChart) OR ?A paper copy in the mail ?If you have any lab test that is abnormal or we need to change your treatment, we will call you to review the results. ? ? ?Testing/Procedures: ?None ? ? ?Follow-Up: ?At [facility], you and your health needs are our priority.  As part of our continuing mission to provide you with exceptional heart care, we have created designated Provider Care Teams.  These Care Teams include your primary Cardiologist (physician) and Advanced Practice Providers (APPs -  Physician Assistants and Nurse Practitioners) who all work together to provide you with the care you need, when you need it. ? ?We recommend signing up for the patient portal called "MyChart".  Sign up information is provided on this After Visit Summary.  MyChart is used to connect with patients for Virtual Visits (Telemedicine).  Patients are able to view lab/test results, encounter notes, upcoming appointments, etc.  Non-urgent messages can be sent to your provider as well.   ?To learn more about what you can do with MyChart, go to [email or web address].   ? ?Your next appointment:   ?6 month(s) ? ?The format for your next appointment:   ?In Person ? ?Provider:   ?APP then, Quay Burow, MD will plan to see you again in 12 months ? ? ?Other Instructions ? ? ?Important Information About Sugar ? ? ? ? ?  ?

## 2021-05-07 NOTE — Assessment & Plan Note (Signed)
History of sleep apnea although she never pursued a sleep study. ?

## 2021-05-07 NOTE — Assessment & Plan Note (Signed)
History of essential hypertension a blood pressure measured today at 250/102.  She says normally her blood pressure at home is in the 130/80 range.  She is on amlodipine, carvedilol and hydralazine. ?

## 2021-05-20 ENCOUNTER — Other Ambulatory Visit: Payer: Self-pay | Admitting: Cardiovascular Disease

## 2021-05-28 DIAGNOSIS — N185 Chronic kidney disease, stage 5: Secondary | ICD-10-CM | POA: Diagnosis not present

## 2021-06-01 DIAGNOSIS — I120 Hypertensive chronic kidney disease with stage 5 chronic kidney disease or end stage renal disease: Secondary | ICD-10-CM | POA: Diagnosis not present

## 2021-06-01 DIAGNOSIS — N185 Chronic kidney disease, stage 5: Secondary | ICD-10-CM | POA: Diagnosis not present

## 2021-06-01 DIAGNOSIS — D631 Anemia in chronic kidney disease: Secondary | ICD-10-CM | POA: Diagnosis not present

## 2021-06-01 DIAGNOSIS — N2581 Secondary hyperparathyroidism of renal origin: Secondary | ICD-10-CM | POA: Diagnosis not present

## 2021-06-22 ENCOUNTER — Other Ambulatory Visit: Payer: Self-pay | Admitting: Cardiovascular Disease

## 2021-06-26 ENCOUNTER — Other Ambulatory Visit: Payer: Self-pay | Admitting: Cardiovascular Disease

## 2021-07-06 ENCOUNTER — Other Ambulatory Visit: Payer: Self-pay

## 2021-07-08 ENCOUNTER — Other Ambulatory Visit: Payer: Self-pay | Admitting: Cardiovascular Disease

## 2021-08-04 DIAGNOSIS — N185 Chronic kidney disease, stage 5: Secondary | ICD-10-CM | POA: Diagnosis not present

## 2021-08-06 DIAGNOSIS — N185 Chronic kidney disease, stage 5: Secondary | ICD-10-CM | POA: Diagnosis not present

## 2021-08-06 DIAGNOSIS — N2581 Secondary hyperparathyroidism of renal origin: Secondary | ICD-10-CM | POA: Diagnosis not present

## 2021-08-06 DIAGNOSIS — I120 Hypertensive chronic kidney disease with stage 5 chronic kidney disease or end stage renal disease: Secondary | ICD-10-CM | POA: Diagnosis not present

## 2021-08-06 DIAGNOSIS — D631 Anemia in chronic kidney disease: Secondary | ICD-10-CM | POA: Diagnosis not present

## 2021-08-27 DIAGNOSIS — N185 Chronic kidney disease, stage 5: Secondary | ICD-10-CM | POA: Diagnosis not present

## 2021-09-04 DIAGNOSIS — N185 Chronic kidney disease, stage 5: Secondary | ICD-10-CM | POA: Diagnosis not present

## 2021-09-04 DIAGNOSIS — I120 Hypertensive chronic kidney disease with stage 5 chronic kidney disease or end stage renal disease: Secondary | ICD-10-CM | POA: Diagnosis not present

## 2021-09-04 DIAGNOSIS — D631 Anemia in chronic kidney disease: Secondary | ICD-10-CM | POA: Diagnosis not present

## 2021-09-04 DIAGNOSIS — N2581 Secondary hyperparathyroidism of renal origin: Secondary | ICD-10-CM | POA: Diagnosis not present

## 2021-10-05 DIAGNOSIS — N185 Chronic kidney disease, stage 5: Secondary | ICD-10-CM | POA: Diagnosis not present

## 2021-10-09 DIAGNOSIS — I120 Hypertensive chronic kidney disease with stage 5 chronic kidney disease or end stage renal disease: Secondary | ICD-10-CM | POA: Diagnosis not present

## 2021-10-09 DIAGNOSIS — N2581 Secondary hyperparathyroidism of renal origin: Secondary | ICD-10-CM | POA: Diagnosis not present

## 2021-10-09 DIAGNOSIS — D631 Anemia in chronic kidney disease: Secondary | ICD-10-CM | POA: Diagnosis not present

## 2021-10-09 DIAGNOSIS — N185 Chronic kidney disease, stage 5: Secondary | ICD-10-CM | POA: Diagnosis not present

## 2021-10-14 ENCOUNTER — Ambulatory Visit (INDEPENDENT_AMBULATORY_CARE_PROVIDER_SITE_OTHER): Payer: BC Managed Care – PPO | Admitting: Vascular Surgery

## 2021-10-14 ENCOUNTER — Other Ambulatory Visit: Payer: Self-pay

## 2021-10-14 ENCOUNTER — Encounter: Payer: Self-pay | Admitting: Vascular Surgery

## 2021-10-14 VITALS — BP 232/100 | HR 68 | Temp 98.0°F | Resp 20 | Ht 64.0 in | Wt 196.0 lb

## 2021-10-14 DIAGNOSIS — N185 Chronic kidney disease, stage 5: Secondary | ICD-10-CM

## 2021-10-14 NOTE — Progress Notes (Signed)
Patient ID: [name], female   DOB: 1960-11-27, 62 y.o.   MRN: [id number]  Reason for Consult: New Patient (Initial Visit)   Referred by Rosita Fire, *  Subjective:     HPI:  [name] is a 61 y.o. female now with CKD 5 has a history of a left arm AV fistula with revision and transposition.  She does have some increased bruising in the left arm but nothing other than that and no pain.  She has not been on dialysis ever.  She now states that she will need to begin dialysis soon is hopeful to do this at home and is here to discuss PD catheter placement.  She does have a history of an appendectomy when she was much younger no other abdominal surgeries.  She takes aspirin no other blood thinners.  Past Medical History:  Diagnosis Date   Angina    CAD (coronary artery disease), non obstructive CAD in 2010    a. 2013 Cath: LAD 60, RCA 37m 70d; b. 2014 Cath: stable anatomy.   Chronic kidney disease    polycystic kidney disease , elevated creatine   COPD (chronic obstructive pulmonary disease) ([facility]    Diabetes mellitus without complication ([facility]    a. 12/2014 HbA1c 6.5; b. Rx Glipizide - not taking.   Family history of premature CAD    GERD (gastroesophageal reflux disease)    Hx of adenomatous colonic polyps 06/11/2016   Hyperlipidemia    Hypertension    Hypertensive heart disease    Palpitations    Reflux    Sleep apnea    mild, no c-pap at this point   Tobacco abuse    Family History  Problem Relation Age of Onset   Stroke Father    Hypertension Father    Coronary artery disease Brother    Diabetes Mother    Stroke Maternal Grandmother    Cancer Maternal Grandmother        Breast cancer   Hypertension Maternal Grandfather    Heart attack Maternal Grandfather    Heart attack Maternal Aunt 41   Colon cancer Neg Hx    Esophageal cancer Neg Hx    Stomach cancer Neg Hx    Rectal cancer Neg Hx    Past Surgical History:  Procedure Laterality Date    APPENDECTOMY  1974   AV FISTULA PLACEMENT Left 02/19/2020   Procedure: LEFT ARM BRACHIOCEPHALIC  ARTERIOVENOUS (AV) FISTULA CREATION;  Surgeon: CWaynetta Sandy MD;  [facility]  Service: Vascular;  Laterality: Left;   CARDIAC CATHETERIZATION  02/07/2008   Recommendation-F/U stress test   CARDIAC CATHETERIZATION  04/16/2011   Recommendation-Increase medical therapy trial   CARDIOVASCULAR STRESS TEST  03/25/2011   Suspect subtle anterior septal ischemia   COLONOSCOPY     FISTULA SUPERFICIALIZATION Left 05/13/2020   Procedure: FISTULA SUPERFICIALIZATION LEFT;  Surgeon: CWaynetta Sandy MD;  [facility]  Service: Vascular;  Laterality: Left;   FRACTURE SURGERY Left 1972   forearm fracture   LEFT HEART CATHETERIZATION WITH CORONARY ANGIOGRAM N/A 04/16/2011   Procedure: LEFT HEART CATHETERIZATION WITH CORONARY ANGIOGRAM;  Surgeon: TTroy Sine MD;  [facility];  Service: Cardiovascular;  Laterality: N/A;   LEFT HEART CATHETERIZATION WITH CORONARY ANGIOGRAM N/A 09/01/2012   Procedure: LEFT HEART CATHETERIZATION WITH CORONARY ANGIOGRAM;  Surgeon: KPixie Casino MD;  [facility];  Service: Cardiovascular;  Laterality: N/A;   POLYPECTOMY     RENAL DOPPLER  06/05/2009   No evidence of significant diameter reduction   TRANSTHORACIC ECHOCARDIOGRAM  01/25/2008   EF 60-65%, Moderate concentric LVH   WRIST FRACTURE SURGERY Right ~ 1996    Short Social History:  Social History   Tobacco Use   Smoking status: Former    Packs/day: 0.25    Years: 30.00    Total pack years: 7.50    Types: Cigarettes    Quit date: 05/19/2021    Years since quitting: 0.4   Smokeless tobacco: Never   Tobacco comments:    2-  weeks  Substance Use Topics   Alcohol use: No    Alcohol/week: 0.0 standard drinks of alcohol    Allergies  Allergen Reactions   Bupropion     Other reaction(s): Other Suicidal thoughts and increased depression   Doxazosin Mesylate      Increased heart rate   Rosuvastatin Other (See Comments)    myalgia   Simvastatin     myalgias   Wellbutrin [Bupropion Hcl]     unknown   Zetia [Ezetimibe]     myalgias    Current Outpatient Medications  Medication Sig Dispense Refill   amLODipine (NORVASC) 10 MG tablet TAKE 1 TABLET BY MOUTH DAILY. PLEASE SCHEDULE AN APPOINTMENT FOR FURTHER REFILLS. 1ST ATTEMPT. 90 tablet 3   aspirin 81 MG tablet Take 81 mg by mouth at bedtime.     carvedilol (COREG) 25 MG tablet TAKE 1 TABLET BY MOUTH TWICE A DAY WITH A MEAL TO REPLACE METOPROLOL 180 tablet 2   Cholecalciferol (VITAMIN D3) 50 MCG (2000 UT) TABS Take 2,000 Units by mouth daily.     cloNIDine (CATAPRES) 0.2 MG tablet Take 1 tablet (0.2 mg total) by mouth 2 (two) times daily. Appointment needed 30 tablet 0   CVS IRON 325 (65 Fe) MG tablet Take 325 mg by mouth daily.     hydrALAZINE (APRESOLINE) 50 MG tablet Take 50 mg by mouth 2 (two) times daily.     Oyster Shell Calcium 500 MG TABS Take 500 mg by mouth 2 (two) times daily.     sevelamer carbonate (RENVELA) 800 MG tablet Take 800 mg by mouth 3 (three) times daily.     sodium bicarbonate 650 MG tablet Take 1,300 mg by mouth 2 (two) times daily.     No current facility-administered medications for this visit.    Review of Systems  Constitutional:  Constitutional negative. HENT: HENT negative.  Eyes: Eyes negative.  Respiratory: Respiratory negative.  Cardiovascular: Cardiovascular negative.  GI: Gastrointestinal negative.  Musculoskeletal: Musculoskeletal negative.  Skin: Skin negative.  Neurological: Neurological negative. Hematologic: Positive for bruises/bleeds easily.        Objective:  Objective   Vitals:   10/14/21 1359  BP: (!) 232/100  Pulse: 68  Resp: 20  Temp: 98 F (36.7 C)  SpO2: 92%  Weight: 196 lb (88.9 kg)  Height: '5\' 4"'$  (1.626 m)   Body mass index is 33.64 kg/m.  Physical Exam HENT:     Head: Normocephalic.     Nose: Nose normal.  Eyes:      Pupils: Pupils are equal, round, and reactive to light.  Cardiovascular:     Rate and Rhythm: Normal rate.     Pulses:          Radial pulses are 2+ on the left side.  Pulmonary:     Effort: Pulmonary effort is normal.  Abdominal:     General: Abdomen is flat.     Palpations: Abdomen  is soft.  Musculoskeletal:     Cervical back: Normal range of motion and neck supple.     Comments: Strong left upper arm AV fistula thrill  Skin:    General: Skin is warm.     Capillary Refill: Capillary refill takes less than 2 seconds.  Neurological:     General: No focal deficit present.     Mental Status: She is alert.     Data: No studies today     Assessment/Plan:    61 year old female now CKD 5 plans for peritoneal dialysis catheter placement.  History of appendectomy as her only abdominal surgery.  I discussed the risk and benefits primarily the risk of intra-abdominal organs and the risk of primary nonfunction of the catheter possibly requiring revision versus removal.  She demonstrates good understanding we will get this set up in the near future so it is ready when she requires dialysis.     Waynetta Sandy MD [facility]

## 2021-10-21 ENCOUNTER — Other Ambulatory Visit (HOSPITAL_COMMUNITY): Payer: Self-pay | Admitting: *Deleted

## 2021-10-21 DIAGNOSIS — N181 Chronic kidney disease, stage 1: Secondary | ICD-10-CM

## 2021-10-22 ENCOUNTER — Emergency Department (HOSPITAL_COMMUNITY)
Admission: EM | Admit: 2021-10-22 | Discharge: 2021-10-22 | Disposition: A | Discharge status: Home / Self Care | Payer: BC Managed Care – PPO | Source: Home / Self Care | Attending: Emergency Medicine | Admitting: Emergency Medicine

## 2021-10-22 ENCOUNTER — Other Ambulatory Visit: Payer: Self-pay

## 2021-10-22 ENCOUNTER — Encounter (HOSPITAL_COMMUNITY): Payer: Self-pay

## 2021-10-22 ENCOUNTER — Emergency Department (HOSPITAL_COMMUNITY): Payer: BC Managed Care – PPO

## 2021-10-22 ENCOUNTER — Encounter (HOSPITAL_COMMUNITY): Payer: Self-pay | Admitting: Emergency Medicine

## 2021-10-22 ENCOUNTER — Ambulatory Visit (HOSPITAL_COMMUNITY)
Admission: RE | Admit: 2021-10-22 | Discharge: 2021-10-22 | Disposition: A | Discharge status: Home / Self Care | Payer: BC Managed Care – PPO | Source: Ambulatory Visit | Attending: Nephrology | Admitting: Nephrology

## 2021-10-22 DIAGNOSIS — Z992 Dependence on renal dialysis: Secondary | ICD-10-CM | POA: Diagnosis not present

## 2021-10-22 DIAGNOSIS — G4733 Obstructive sleep apnea (adult) (pediatric): Secondary | ICD-10-CM | POA: Diagnosis not present

## 2021-10-22 DIAGNOSIS — Z6833 Body mass index (BMI) 33.0-33.9, adult: Secondary | ICD-10-CM | POA: Diagnosis not present

## 2021-10-22 DIAGNOSIS — N185 Chronic kidney disease, stage 5: Secondary | ICD-10-CM | POA: Diagnosis not present

## 2021-10-22 DIAGNOSIS — I2510 Atherosclerotic heart disease of native coronary artery without angina pectoris: Secondary | ICD-10-CM | POA: Diagnosis not present

## 2021-10-22 DIAGNOSIS — N186 End stage renal disease: Secondary | ICD-10-CM | POA: Insufficient documentation

## 2021-10-22 DIAGNOSIS — N181 Chronic kidney disease, stage 1: Secondary | ICD-10-CM | POA: Insufficient documentation

## 2021-10-22 DIAGNOSIS — D649 Anemia, unspecified: Secondary | ICD-10-CM | POA: Diagnosis not present

## 2021-10-22 DIAGNOSIS — N179 Acute kidney failure, unspecified: Secondary | ICD-10-CM | POA: Diagnosis not present

## 2021-10-22 DIAGNOSIS — E1122 Type 2 diabetes mellitus with diabetic chronic kidney disease: Secondary | ICD-10-CM | POA: Insufficient documentation

## 2021-10-22 DIAGNOSIS — Q613 Polycystic kidney, unspecified: Secondary | ICD-10-CM | POA: Diagnosis not present

## 2021-10-22 DIAGNOSIS — J449 Chronic obstructive pulmonary disease, unspecified: Secondary | ICD-10-CM | POA: Diagnosis not present

## 2021-10-22 DIAGNOSIS — Z888 Allergy status to other drugs, medicaments and biological substances status: Secondary | ICD-10-CM | POA: Diagnosis not present

## 2021-10-22 DIAGNOSIS — E785 Hyperlipidemia, unspecified: Secondary | ICD-10-CM | POA: Diagnosis not present

## 2021-10-22 DIAGNOSIS — Z79899 Other long term (current) drug therapy: Secondary | ICD-10-CM | POA: Diagnosis not present

## 2021-10-22 DIAGNOSIS — N184 Chronic kidney disease, stage 4 (severe): Secondary | ICD-10-CM | POA: Diagnosis not present

## 2021-10-22 DIAGNOSIS — I10 Essential (primary) hypertension: Secondary | ICD-10-CM | POA: Diagnosis not present

## 2021-10-22 DIAGNOSIS — R519 Headache, unspecified: Secondary | ICD-10-CM | POA: Diagnosis not present

## 2021-10-22 DIAGNOSIS — Z803 Family history of malignant neoplasm of breast: Secondary | ICD-10-CM | POA: Diagnosis not present

## 2021-10-22 DIAGNOSIS — I120 Hypertensive chronic kidney disease with stage 5 chronic kidney disease or end stage renal disease: Secondary | ICD-10-CM | POA: Insufficient documentation

## 2021-10-22 DIAGNOSIS — F17210 Nicotine dependence, cigarettes, uncomplicated: Secondary | ICD-10-CM | POA: Diagnosis not present

## 2021-10-22 DIAGNOSIS — D631 Anemia in chronic kidney disease: Secondary | ICD-10-CM | POA: Diagnosis not present

## 2021-10-22 DIAGNOSIS — R531 Weakness: Secondary | ICD-10-CM | POA: Diagnosis not present

## 2021-10-22 DIAGNOSIS — Z7982 Long term (current) use of aspirin: Secondary | ICD-10-CM | POA: Diagnosis not present

## 2021-10-22 DIAGNOSIS — Z8249 Family history of ischemic heart disease and other diseases of the circulatory system: Secondary | ICD-10-CM | POA: Diagnosis not present

## 2021-10-22 DIAGNOSIS — I160 Hypertensive urgency: Secondary | ICD-10-CM | POA: Diagnosis not present

## 2021-10-22 DIAGNOSIS — E669 Obesity, unspecified: Secondary | ICD-10-CM | POA: Diagnosis not present

## 2021-10-22 DIAGNOSIS — Z86010 Personal history of colonic polyps: Secondary | ICD-10-CM | POA: Diagnosis not present

## 2021-10-22 DIAGNOSIS — Z833 Family history of diabetes mellitus: Secondary | ICD-10-CM | POA: Diagnosis not present

## 2021-10-22 DIAGNOSIS — I5032 Chronic diastolic (congestive) heart failure: Secondary | ICD-10-CM | POA: Diagnosis not present

## 2021-10-22 DIAGNOSIS — N250 Renal osteodystrophy: Secondary | ICD-10-CM | POA: Diagnosis not present

## 2021-10-22 DIAGNOSIS — D508 Other iron deficiency anemias: Secondary | ICD-10-CM | POA: Diagnosis not present

## 2021-10-22 DIAGNOSIS — Z823 Family history of stroke: Secondary | ICD-10-CM | POA: Diagnosis not present

## 2021-10-22 DIAGNOSIS — E8889 Other specified metabolic disorders: Secondary | ICD-10-CM | POA: Diagnosis not present

## 2021-10-22 DIAGNOSIS — I132 Hypertensive heart and chronic kidney disease with heart failure and with stage 5 chronic kidney disease, or end stage renal disease: Secondary | ICD-10-CM | POA: Diagnosis not present

## 2021-10-22 LAB — CBC WITH DIFFERENTIAL/PLATELET
Abs Immature Granulocytes: 0.03 10*3/uL (ref 0.00–0.07)
Basophils Absolute: 0 10*3/uL (ref 0.0–0.1)
Basophils Relative: 1 %
Eosinophils Absolute: 0.4 10*3/uL (ref 0.0–0.5)
Eosinophils Relative: 5 %
HCT: 23.2 % — ABNORMAL LOW (ref 36.0–46.0)
Hemoglobin: 7.4 g/dL — ABNORMAL LOW (ref 12.0–15.0)
Immature Granulocytes: 0 %
Lymphocytes Relative: 17 %
Lymphs Abs: 1.5 10*3/uL (ref 0.7–4.0)
MCH: 29.2 pg (ref 26.0–34.0)
MCHC: 31.9 g/dL (ref 30.0–36.0)
MCV: 91.7 fL (ref 80.0–100.0)
Monocytes Absolute: 0.7 10*3/uL (ref 0.1–1.0)
Monocytes Relative: 7 %
Neutro Abs: 6.1 10*3/uL (ref 1.7–7.7)
Neutrophils Relative %: 70 %
Platelets: 186 10*3/uL (ref 150–400)
RBC: 2.53 MIL/uL — ABNORMAL LOW (ref 3.87–5.11)
RDW: 13.8 % (ref 11.5–15.5)
WBC: 8.8 10*3/uL (ref 4.0–10.5)
nRBC: 0 % (ref 0.0–0.2)

## 2021-10-22 LAB — URINALYSIS, ROUTINE W REFLEX MICROSCOPIC
Bilirubin Urine: NEGATIVE
Glucose, UA: 50 mg/dL — AB
Hgb urine dipstick: NEGATIVE
Ketones, ur: NEGATIVE mg/dL
Leukocytes,Ua: NEGATIVE
Nitrite: NEGATIVE
Protein, ur: >=300 mg/dL — AB
Specific Gravity, Urine: 1.009 (ref 1.005–1.030)
pH: 8 (ref 5.0–8.0)

## 2021-10-22 LAB — COMPREHENSIVE METABOLIC PANEL
ALT: 18 U/L (ref 0–44)
AST: 14 U/L — ABNORMAL LOW (ref 15–41)
Albumin: 3.1 g/dL — ABNORMAL LOW (ref 3.5–5.0)
Alkaline Phosphatase: 37 U/L — ABNORMAL LOW (ref 38–126)
Anion gap: 11 (ref 5–15)
BUN: 107 mg/dL — ABNORMAL HIGH (ref 6–20)
CO2: 25 mmol/L (ref 22–32)
Calcium: 8.3 mg/dL — ABNORMAL LOW (ref 8.9–10.3)
Chloride: 102 mmol/L (ref 98–111)
Creatinine, Ser: 13.71 mg/dL — ABNORMAL HIGH (ref 0.44–1.00)
GFR, Estimated: 3 mL/min — ABNORMAL LOW (ref >60)
Glucose, Bld: 102 mg/dL — ABNORMAL HIGH (ref 70–99)
Potassium: 3.9 mmol/L (ref 3.5–5.1)
Sodium: 138 mmol/L (ref 135–145)
Total Bilirubin: 0.7 mg/dL (ref 0.3–1.2)
Total Protein: 6 g/dL — ABNORMAL LOW (ref 6.5–8.1)

## 2021-10-22 LAB — POCT HEMOGLOBIN-HEMACUE: Hemoglobin: 8.3 g/dL — ABNORMAL LOW (ref 12.0–15.0)

## 2021-10-22 MED ORDER — CLONIDINE HCL 0.2 MG PO TABS: 0.2000 mg | ORAL_TABLET | Freq: Once | ORAL | Status: DC

## 2021-10-22 MED ORDER — CLONIDINE HCL 0.2 MG PO TABS
0.3000 mg | ORAL_TABLET | Freq: Once | ORAL | Status: AC
  Administered 2021-10-22: 0.3 mg via ORAL
  Filled 2021-10-22: qty 1

## 2021-10-22 MED ORDER — AMLODIPINE BESYLATE 5 MG PO TABS: 10.0000 mg | ORAL_TABLET | Freq: Once | ORAL | Status: DC

## 2021-10-22 MED ORDER — HYDRALAZINE HCL 20 MG/ML IJ SOLN
20.0000 mg | Freq: Once | INTRAMUSCULAR | Status: AC
  Administered 2021-10-22: 20 mg via INTRAVENOUS
  Filled 2021-10-22: qty 1

## 2021-10-22 MED ORDER — SODIUM CHLORIDE 0.9 % IV SOLN
510.0000 mg | Freq: Once | INTRAVENOUS | Status: AC
  Administered 2021-10-22: 510 mg via INTRAVENOUS
  Filled 2021-10-22: qty 510

## 2021-10-22 MED ORDER — AMLODIPINE BESYLATE 10 MG PO TABS: 10.0000 mg | ORAL_TABLET | Freq: Every day | ORAL | 0 refills | Status: DC

## 2021-10-22 MED ORDER — HYDRALAZINE HCL 20 MG/ML IJ SOLN
10.0000 mg | Freq: Once | INTRAMUSCULAR | Status: AC
  Administered 2021-10-22: 10 mg via INTRAVENOUS
  Filled 2021-10-22: qty 1

## 2021-10-22 MED ORDER — EPOETIN ALFA-EPBX 10000 UNIT/ML IJ SOLN: 15000.0000 [IU] | INTRAMUSCULAR | Status: DC

## 2021-10-22 MED ORDER — CARVEDILOL 12.5 MG PO TABS
25.0000 mg | ORAL_TABLET | Freq: Once | ORAL | Status: AC
  Administered 2021-10-22: 25 mg via ORAL
  Filled 2021-10-22: qty 2

## 2021-10-22 MED ORDER — CLONIDINE HCL 0.1 MG PO TABS
0.1000 mg | ORAL_TABLET | Freq: Once | ORAL | Status: AC
  Administered 2021-10-22: 0.1 mg via ORAL

## 2021-10-22 MED ORDER — CLONIDINE HCL 0.1 MG PO TABS
ORAL_TABLET | ORAL | Status: AC
  Filled 2021-10-22: qty 1

## 2021-10-22 MED ORDER — EPOETIN ALFA-EPBX 10000 UNIT/ML IJ SOLN
INTRAMUSCULAR | Status: AC
  Filled 2021-10-22: qty 2

## 2021-10-22 MED ORDER — HYDRALAZINE HCL 50 MG PO TABS: 50.0000 mg | ORAL_TABLET | Freq: Three times a day (TID) | ORAL | 0 refills | Status: DC

## 2021-10-22 MED ORDER — AMLODIPINE BESYLATE 5 MG PO TABS
10.0000 mg | ORAL_TABLET | Freq: Once | ORAL | Status: AC
  Administered 2021-10-22: 10 mg via ORAL
  Filled 2021-10-22: qty 2

## 2021-10-22 NOTE — ED Triage Notes (Signed)
Pt states she went to have an iron infusion today but was sent to ED due to HTN. BP 628'M systolic. Pt took BP meds this morning. Denies CP, SOB, dizziness. No symptoms at this time.

## 2021-10-22 NOTE — Discharge Instructions (Signed)
Increase hydralazine 50 mg three times daily   Take Norvasc 10 mg daily.  Please continue taking your clonidine and Coreg as prescribed.  Please see your nephrologist for follow-up this week  Return to ER if you have chest pain or shortness of breath or headache.

## 2021-10-22 NOTE — ED Provider Notes (Signed)
[facility] EMERGENCY DEPARTMENT Provider Note   CSN: [id number] Arrival date & time: 10/22/21  1243     History  Chief Complaint  Patient presents with   Hypertension    MARLICIA SROKA is a 61 y.o. female history of ESRD not on dialysis, hypertension, diabetes here presenting with hypertension.  Patient has anemia of chronic disease from her kidney failure.  Patient is not yet on dialysis but has a fistula placed.  Patient went to get her iron infusion today and was noted to be hypertensive with blood pressure over 200.  Patient states that she is compliant with her medicine.  She is supposed to be on Coreg and clonidine and hydralazine which she is taking.  She states that she ran out of Norvasc 10 mg and it was never refilled.  Denies any chest pain or abdominal pain or dizziness.  Patient is supposed to start dialysis soon but is not on dialysis yet.  The history is provided by the patient.       Home Medications Prior to Admission medications   Medication Sig Start Date End Date Taking? Authorizing Provider  amLODipine (NORVASC) 10 MG tablet TAKE 1 TABLET BY MOUTH DAILY. PLEASE SCHEDULE AN APPOINTMENT FOR FURTHER REFILLS. 1ST ATTEMPT. 07/09/21   Lorretta Harp, MD  aspirin 81 MG tablet Take 81 mg by mouth at bedtime.    [provider]  carvedilol (COREG) 25 MG tablet TAKE 1 TABLET BY MOUTH TWICE A DAY WITH A MEAL TO REPLACE METOPROLOL 06/26/21   Lorretta Harp, MD  Cholecalciferol (VITAMIN D3) 50 MCG (2000 UT) TABS Take 2,000 Units by mouth daily.    [provider]  cloNIDine (CATAPRES) 0.2 MG tablet Take 1 tablet (0.2 mg total) by mouth 2 (two) times daily. Appointment needed 03/03/21   Lorretta Harp, MD  CVS IRON 325 (65 Fe) MG tablet Take 325 mg by mouth daily. 04/04/20   [provider]  hydrALAZINE (APRESOLINE) 50 MG tablet Take 50 mg by mouth 2 (two) times daily. 04/23/21   [provider]  Loma Boston  Calcium 500 MG TABS Take 500 mg by mouth 2 (two) times daily. 04/18/19   [provider]  sevelamer carbonate (RENVELA) 800 MG tablet Take 800 mg by mouth 3 (three) times daily. 04/24/21   [provider]  sodium bicarbonate 650 MG tablet Take 1,300 mg by mouth 2 (two) times daily. 04/04/20   [provider]      Allergies    Bupropion, Doxazosin mesylate, Rosuvastatin, Simvastatin, Wellbutrin [bupropion hcl], and Zetia [ezetimibe]    Review of Systems   Review of Systems  Cardiovascular:  Negative for chest pain.  Gastrointestinal:  Negative for abdominal pain.  All other systems reviewed and are negative.   Physical Exam Updated Vital Signs BP (!) 229/83   Pulse 66   Temp 98.3 F (36.8 C)   Resp 18   LMP 12/14/2013   SpO2 (!) 87%  Physical Exam Vitals and nursing note reviewed.  Constitutional:      Comments: Chronically ill  HENT:     Head: Normocephalic.     Nose: Nose normal.     Mouth/Throat:     Mouth: Mucous membranes are moist.  Eyes:     Extraocular Movements: Extraocular movements intact.     Pupils: Pupils are equal, round, and reactive to light.  Cardiovascular:     Rate and Rhythm: Normal rate and regular rhythm.  Pulses: Normal pulses.     Heart sounds: Normal heart sounds.  Pulmonary:     Effort: Pulmonary effort is normal.     Breath sounds: Normal breath sounds.  Abdominal:     General: Abdomen is flat.     Palpations: Abdomen is soft.  Musculoskeletal:     Cervical back: Normal range of motion and neck supple.     Comments: Left arm fistula with good thrill  Skin:    General: Skin is warm.     Capillary Refill: Capillary refill takes less than 2 seconds.  Neurological:     General: No focal deficit present.     Mental Status: She is oriented to person, place, and time.  Psychiatric:        Mood and Affect: Mood normal.        Behavior: Behavior normal.     ED Results / Procedures / Treatments   Labs (all labs  ordered are listed, but only abnormal results are displayed) Labs Reviewed  COMPREHENSIVE METABOLIC PANEL - Abnormal; Notable for the following components:      Result Value   Glucose, Bld 102 (*)    BUN 107 (*)    Creatinine, Ser 13.71 (*)    Calcium 8.3 (*)    Total Protein 6.0 (*)    Albumin 3.1 (*)    AST 14 (*)    Alkaline Phosphatase 37 (*)    GFR, Estimated 3 (*)    All other components within normal limits  CBC WITH DIFFERENTIAL/PLATELET - Abnormal; Notable for the following components:   RBC 2.53 (*)    Hemoglobin 7.4 (*)    HCT 23.2 (*)    All other components within normal limits  URINALYSIS, ROUTINE W REFLEX MICROSCOPIC - Abnormal; Notable for the following components:   Color, Urine STRAW (*)    Glucose, UA 50 (*)    Protein, ur >=300 (*)    Bacteria, UA RARE (*)    All other components within normal limits    EKG EKG Interpretation  Date/Time:  Thursday October 22 2021 14:36:23 EDT Ventricular Rate:  62 PR Interval:  184 QRS Duration: 88 QT Interval:  452 QTC Calculation: 458 R Axis:   57 Text Interpretation: Normal sinus rhythm Cannot rule out Anterior infarct , age undetermined Abnormal ECG When compared with ECG of 18-Dec-2014 11:09, PREVIOUS ECG IS PRESENT Confirmed by Wandra Arthurs [phone]) on 10/22/2021 6:04:25 PM  Radiology No results found.  Procedures Procedures    Medications Ordered in ED Medications  hydrALAZINE (APRESOLINE) injection 10 mg (10 mg Intravenous Given 10/22/21 1841)  carvedilol (COREG) tablet 25 mg (25 mg Oral Given 10/22/21 1842)  cloNIDine (CATAPRES) tablet 0.3 mg (0.3 mg Oral Given 10/22/21 1842)  amLODipine (NORVASC) tablet 10 mg (10 mg Oral Given 10/22/21 1842)    ED Course/ Medical Decision Making/ A&P                           Medical Decision Making Graycen Degan Bromwell is a 61 y.o. female here presenting with hypertension.  Hypertension likely from renal failure.  Patient supposed to start dialysis.  Patient has no  headache or chest pain to suggest head bleed or dissection or ACS.  This is likely chronic uncontrolled hypertension.  We will get basic labs.  Patient also ran out of Norvasc so we will put her back on Norvasc and increase the dose of hydralazine and clonidine.  We  will also give her evening dose of Coreg as well.  10:21 PM BP down to 170s.  Creatinine is baseline at 13.  BUN is 107.  Patient was given hydralazine and clonidine and Norvasc and Coreg.  Blood pressure is down to 170s now.  At this point, I will increase her to hydralazine to 3 times a day and put her back on Norvasc.  Problems Addressed: ESRD (end stage renal disease) ([facility] [place]): acute illness or injury Hypertension, unspecified type: acute illness or injury  Amount and/or Complexity of Data Reviewed Labs: ordered. Decision-making details documented in ED Course. Radiology: ordered and independent interpretation performed. Decision-making details documented in ED Course.  Risk Prescription drug management.   Final Clinical Impression(s) / ED Diagnoses Final diagnoses:  None    Rx / DC Orders ED Discharge Orders     None         Drenda Freeze, MD 10/22/21 2224

## 2021-10-22 NOTE — ED Notes (Signed)
Py unable to be locate in lobby

## 2021-10-22 NOTE — Progress Notes (Signed)
Pt in infusion clinic for first visit of Reticrit and a Feraheme infusion.  States took her BP med this morning already. Upon arrival to our unit her BP was 237/90.  We went ahead and infused the Feraheme and then repeated her BP.  Pt was comfortable and sleeping prior to this check and it was 236/84. I gave her the clonidine as ordered on the Reticrit protocol and waited 30 minutes before checking again, at which time it was 246/89.  Pt denies any symptoms of headache or visual changes and states feels normal. Dr Louie Boston office called and I spoke with Mechele Claude who instructed [place] to have her go to the emergency room.  We called the charge nurse and transported her to the ED via wheel chair. We left her NS lock in place.

## 2021-10-22 NOTE — ED Notes (Signed)
Placed on 3L [facility] at this time

## 2021-10-22 NOTE — ED Provider Triage Note (Signed)
Emergency Medicine Provider Triage Evaluation Note  [name] , a 61 y.o. female  was evaluated in triage.  Pt complains of worsening hypertension over the last few weeks.  Went to get her iron infusion therapy, was recommended to come to the ED due to her blood pressure reading of about 240/90.  Upon arrival to the ED today blood pressure 240/90.  Patient states this is how high its been over the last 3 to 4 weeks.  Hx of essential HTN, CAD, dyslipidemia, metabolic syndrome, chronic tobacco use, OSA, DMT2.  Reports taking her carvedilol, amlodipine, hydralazine, and clonidine as per bribed.  Denies any symptoms, chest pain, shortness of breath, leg swelling, headache, vision changes, or dizziness.  Review of Systems  Positive:  Negative: See above  Physical Exam  BP (!) 240/90 (BP Location: Right Arm)   Pulse 61   Temp 98.6 F (37 C)   Resp 18   LMP 12/14/2013   SpO2 90%  Gen:   Awake, no distress   Resp:  Normal effort  MSK:   Moves extremities without difficulty  Other:  Chest non-TTP.  Abdomen soft, non-TTP.  Medical Decision Making  Medically screening exam initiated at 2:24 PM.  Appropriate orders placed.  [name] was informed that the remainder of the evaluation will be completed by another provider, this initial triage assessment does not replace that evaluation, and the importance of remaining in the ED until their evaluation is complete.     Madison Rome, PA-C 28/63/81 1427

## 2021-10-24 ENCOUNTER — Emergency Department (HOSPITAL_BASED_OUTPATIENT_CLINIC_OR_DEPARTMENT_OTHER): Payer: BC Managed Care – PPO

## 2021-10-24 ENCOUNTER — Encounter (HOSPITAL_BASED_OUTPATIENT_CLINIC_OR_DEPARTMENT_OTHER): Payer: Self-pay | Admitting: Emergency Medicine

## 2021-10-24 ENCOUNTER — Encounter (HOSPITAL_COMMUNITY): Payer: Self-pay

## 2021-10-24 ENCOUNTER — Other Ambulatory Visit: Payer: Self-pay

## 2021-10-24 ENCOUNTER — Inpatient Hospital Stay (HOSPITAL_BASED_OUTPATIENT_CLINIC_OR_DEPARTMENT_OTHER)
Admission: EM | Admit: 2021-10-24 | Discharge: 2021-10-30 | DRG: 304 | Disposition: A | Discharge status: Home / Self Care | Payer: BC Managed Care – PPO | Attending: Internal Medicine | Admitting: Internal Medicine

## 2021-10-24 ENCOUNTER — Ambulatory Visit
Admission: EM | Admit: 2021-10-24 | Discharge: 2021-10-24 | Disposition: A | Discharge status: Home / Self Care | Payer: BC Managed Care – PPO | Attending: Family Medicine | Admitting: Family Medicine

## 2021-10-24 DIAGNOSIS — G4733 Obstructive sleep apnea (adult) (pediatric): Secondary | ICD-10-CM | POA: Diagnosis present

## 2021-10-24 DIAGNOSIS — I10 Essential (primary) hypertension: Secondary | ICD-10-CM | POA: Diagnosis not present

## 2021-10-24 DIAGNOSIS — D631 Anemia in chronic kidney disease: Secondary | ICD-10-CM | POA: Diagnosis not present

## 2021-10-24 DIAGNOSIS — D649 Anemia, unspecified: Secondary | ICD-10-CM | POA: Diagnosis not present

## 2021-10-24 DIAGNOSIS — I2510 Atherosclerotic heart disease of native coronary artery without angina pectoris: Secondary | ICD-10-CM | POA: Diagnosis present

## 2021-10-24 DIAGNOSIS — F17210 Nicotine dependence, cigarettes, uncomplicated: Secondary | ICD-10-CM | POA: Diagnosis present

## 2021-10-24 DIAGNOSIS — E1122 Type 2 diabetes mellitus with diabetic chronic kidney disease: Secondary | ICD-10-CM | POA: Diagnosis not present

## 2021-10-24 DIAGNOSIS — N185 Chronic kidney disease, stage 5: Secondary | ICD-10-CM | POA: Diagnosis present

## 2021-10-24 DIAGNOSIS — E785 Hyperlipidemia, unspecified: Secondary | ICD-10-CM | POA: Diagnosis not present

## 2021-10-24 DIAGNOSIS — E8889 Other specified metabolic disorders: Secondary | ICD-10-CM | POA: Diagnosis present

## 2021-10-24 DIAGNOSIS — Z8249 Family history of ischemic heart disease and other diseases of the circulatory system: Secondary | ICD-10-CM | POA: Diagnosis not present

## 2021-10-24 DIAGNOSIS — Z7982 Long term (current) use of aspirin: Secondary | ICD-10-CM | POA: Diagnosis not present

## 2021-10-24 DIAGNOSIS — N250 Renal osteodystrophy: Secondary | ICD-10-CM | POA: Diagnosis present

## 2021-10-24 DIAGNOSIS — Z823 Family history of stroke: Secondary | ICD-10-CM | POA: Diagnosis not present

## 2021-10-24 DIAGNOSIS — Z833 Family history of diabetes mellitus: Secondary | ICD-10-CM

## 2021-10-24 DIAGNOSIS — I132 Hypertensive heart and chronic kidney disease with heart failure and with stage 5 chronic kidney disease, or end stage renal disease: Secondary | ICD-10-CM | POA: Diagnosis present

## 2021-10-24 DIAGNOSIS — E669 Obesity, unspecified: Secondary | ICD-10-CM | POA: Diagnosis present

## 2021-10-24 DIAGNOSIS — I5032 Chronic diastolic (congestive) heart failure: Secondary | ICD-10-CM | POA: Diagnosis not present

## 2021-10-24 DIAGNOSIS — Z86010 Personal history of colonic polyps: Secondary | ICD-10-CM | POA: Diagnosis not present

## 2021-10-24 DIAGNOSIS — N186 End stage renal disease: Secondary | ICD-10-CM | POA: Diagnosis present

## 2021-10-24 DIAGNOSIS — Z888 Allergy status to other drugs, medicaments and biological substances status: Secondary | ICD-10-CM

## 2021-10-24 DIAGNOSIS — Q613 Polycystic kidney, unspecified: Secondary | ICD-10-CM | POA: Diagnosis not present

## 2021-10-24 DIAGNOSIS — Z79899 Other long term (current) drug therapy: Secondary | ICD-10-CM | POA: Diagnosis not present

## 2021-10-24 DIAGNOSIS — I160 Hypertensive urgency: Secondary | ICD-10-CM | POA: Diagnosis not present

## 2021-10-24 DIAGNOSIS — J449 Chronic obstructive pulmonary disease, unspecified: Secondary | ICD-10-CM | POA: Diagnosis present

## 2021-10-24 DIAGNOSIS — N184 Chronic kidney disease, stage 4 (severe): Secondary | ICD-10-CM | POA: Diagnosis not present

## 2021-10-24 DIAGNOSIS — Z803 Family history of malignant neoplasm of breast: Secondary | ICD-10-CM | POA: Diagnosis not present

## 2021-10-24 DIAGNOSIS — R252 Cramp and spasm: Secondary | ICD-10-CM | POA: Diagnosis not present

## 2021-10-24 DIAGNOSIS — Z6833 Body mass index (BMI) 33.0-33.9, adult: Secondary | ICD-10-CM

## 2021-10-24 DIAGNOSIS — D508 Other iron deficiency anemias: Secondary | ICD-10-CM | POA: Diagnosis not present

## 2021-10-24 DIAGNOSIS — E1129 Type 2 diabetes mellitus with other diabetic kidney complication: Secondary | ICD-10-CM | POA: Diagnosis present

## 2021-10-24 LAB — URINALYSIS, MICROSCOPIC (REFLEX)

## 2021-10-24 LAB — COMPREHENSIVE METABOLIC PANEL
ALT: 16 U/L (ref 0–44)
AST: 15 U/L (ref 15–41)
Albumin: 3.6 g/dL (ref 3.5–5.0)
Alkaline Phosphatase: 35 U/L — ABNORMAL LOW (ref 38–126)
Anion gap: 14 (ref 5–15)
BUN: 99 mg/dL — ABNORMAL HIGH (ref 6–20)
CO2: 22 mmol/L (ref 22–32)
Calcium: 7.9 mg/dL — ABNORMAL LOW (ref 8.9–10.3)
Chloride: 101 mmol/L (ref 98–111)
Creatinine, Ser: 13.45 mg/dL — ABNORMAL HIGH (ref 0.44–1.00)
GFR, Estimated: 3 mL/min — ABNORMAL LOW (ref >60)
Glucose, Bld: 114 mg/dL — ABNORMAL HIGH (ref 70–99)
Potassium: 3.6 mmol/L (ref 3.5–5.1)
Sodium: 137 mmol/L (ref 135–145)
Total Bilirubin: 0.4 mg/dL (ref 0.3–1.2)
Total Protein: 7.2 g/dL (ref 6.5–8.1)

## 2021-10-24 LAB — CBC WITH DIFFERENTIAL/PLATELET
Abs Immature Granulocytes: 0.05 10*3/uL (ref 0.00–0.07)
Basophils Absolute: 0.1 10*3/uL (ref 0.0–0.1)
Basophils Relative: 1 %
Eosinophils Absolute: 0.5 10*3/uL (ref 0.0–0.5)
Eosinophils Relative: 4 %
HCT: 25.4 % — ABNORMAL LOW (ref 36.0–46.0)
Hemoglobin: 8 g/dL — ABNORMAL LOW (ref 12.0–15.0)
Immature Granulocytes: 1 %
Lymphocytes Relative: 13 %
Lymphs Abs: 1.4 10*3/uL (ref 0.7–4.0)
MCH: 28.3 pg (ref 26.0–34.0)
MCHC: 31.5 g/dL (ref 30.0–36.0)
MCV: 89.8 fL (ref 80.0–100.0)
Monocytes Absolute: 0.7 10*3/uL (ref 0.1–1.0)
Monocytes Relative: 6 %
Neutro Abs: 8.3 10*3/uL — ABNORMAL HIGH (ref 1.7–7.7)
Neutrophils Relative %: 75 %
Platelets: 214 10*3/uL (ref 150–400)
RBC: 2.83 MIL/uL — ABNORMAL LOW (ref 3.87–5.11)
RDW: 14.2 % (ref 11.5–15.5)
WBC: 11 10*3/uL — ABNORMAL HIGH (ref 4.0–10.5)
nRBC: 0 % (ref 0.0–0.2)

## 2021-10-24 LAB — URINALYSIS, ROUTINE W REFLEX MICROSCOPIC
Bilirubin Urine: NEGATIVE
Glucose, UA: 100 mg/dL — AB
Ketones, ur: NEGATIVE mg/dL
Leukocytes,Ua: NEGATIVE
Nitrite: NEGATIVE
Protein, ur: >=300 mg/dL — AB
Specific Gravity, Urine: 1.02 (ref 1.005–1.030)
pH: 7.5 (ref 5.0–8.0)

## 2021-10-24 LAB — TROPONIN I (HIGH SENSITIVITY): Troponin I (High Sensitivity): 18 ng/L — ABNORMAL HIGH (ref <18)

## 2021-10-24 LAB — HEMOGLOBIN A1C
Hgb A1c MFr Bld: 5.5 % (ref 4.8–5.6)
Mean Plasma Glucose: 111.15 mg/dL

## 2021-10-24 LAB — CBG MONITORING, ED: Glucose-Capillary: 108 mg/dL — ABNORMAL HIGH (ref 70–99)

## 2021-10-24 LAB — TSH: TSH: 1.844 u[IU]/mL (ref 0.350–4.500)

## 2021-10-24 LAB — I-STAT VENOUS BLOOD GAS, ED
Acid-base deficit: 3 mmol/L — ABNORMAL HIGH (ref 0.0–2.0)
Bicarbonate: 20.3 mmol/L (ref 20.0–28.0)
Calcium, Ion: 0.97 mmol/L — ABNORMAL LOW (ref 1.15–1.40)
HCT: 23 % — ABNORMAL LOW (ref 36.0–46.0)
Hemoglobin: 7.8 g/dL — ABNORMAL LOW (ref 12.0–15.0)
O2 Saturation: 90 %
Potassium: 3.4 mmol/L — ABNORMAL LOW (ref 3.5–5.1)
Sodium: 140 mmol/L (ref 135–145)
TCO2: 21 mmol/L — ABNORMAL LOW (ref 22–32)
pCO2, Ven: 29.2 mmHg — ABNORMAL LOW (ref 44–60)
pH, Ven: 7.451 — ABNORMAL HIGH (ref 7.25–7.43)
pO2, Ven: 56 mmHg — ABNORMAL HIGH (ref 32–45)

## 2021-10-24 MED ORDER — ACETAMINOPHEN 650 MG RE SUPP: 650.0000 mg | Freq: Four times a day (QID) | RECTAL | Status: DC | PRN

## 2021-10-24 MED ORDER — ASPIRIN 81 MG PO TBEC
81.0000 mg | DELAYED_RELEASE_TABLET | Freq: Every day | ORAL | Status: DC
  Administered 2021-10-24 – 2021-10-29 (×6): 81 mg via ORAL
  Filled 2021-10-24 (×6): qty 1

## 2021-10-24 MED ORDER — HYDROCODONE-ACETAMINOPHEN 5-325 MG PO TABS
1.0000 | ORAL_TABLET | ORAL | Status: DC | PRN
  Administered 2021-10-24 – 2021-10-25 (×3): 1 via ORAL
  Filled 2021-10-24 (×3): qty 1

## 2021-10-24 MED ORDER — SODIUM BICARBONATE 650 MG PO TABS
1950.0000 mg | ORAL_TABLET | Freq: Two times a day (BID) | ORAL | Status: DC
  Administered 2021-10-24 – 2021-10-30 (×12): 1950 mg via ORAL
  Filled 2021-10-24 (×12): qty 3

## 2021-10-24 MED ORDER — SODIUM CHLORIDE 0.9% FLUSH: 3.0000 mL | INTRAVENOUS | Status: DC | PRN

## 2021-10-24 MED ORDER — CLONIDINE HCL 0.1 MG PO TABS
0.2000 mg | ORAL_TABLET | Freq: Once | ORAL | Status: AC
  Administered 2021-10-24: 0.2 mg via ORAL
  Filled 2021-10-24: qty 2

## 2021-10-24 MED ORDER — TRAMADOL HCL 50 MG PO TABS: 50.0000 mg | ORAL_TABLET | Freq: Four times a day (QID) | ORAL | Status: DC | PRN

## 2021-10-24 MED ORDER — HYDRALAZINE HCL 25 MG PO TABS
50.0000 mg | ORAL_TABLET | Freq: Three times a day (TID) | ORAL | Status: DC
  Administered 2021-10-24: 50 mg via ORAL
  Filled 2021-10-24: qty 2

## 2021-10-24 MED ORDER — SODIUM CHLORIDE 0.9% FLUSH
3.0000 mL | Freq: Two times a day (BID) | INTRAVENOUS | Status: DC
  Administered 2021-10-24 – 2021-10-30 (×10): 3 mL via INTRAVENOUS

## 2021-10-24 MED ORDER — HYDRALAZINE HCL 20 MG/ML IJ SOLN
20.0000 mg | Freq: Once | INTRAMUSCULAR | Status: AC
  Administered 2021-10-24: 20 mg via INTRAVENOUS
  Filled 2021-10-24: qty 1

## 2021-10-24 MED ORDER — SEVELAMER CARBONATE 800 MG PO TABS
1600.0000 mg | ORAL_TABLET | Freq: Three times a day (TID) | ORAL | Status: DC
  Administered 2021-10-25 – 2021-10-30 (×14): 1600 mg via ORAL
  Filled 2021-10-24 (×14): qty 2

## 2021-10-24 MED ORDER — SODIUM CHLORIDE 0.9 % IV SOLN: 250.0000 mL | INTRAVENOUS | Status: DC | PRN

## 2021-10-24 MED ORDER — CARVEDILOL 25 MG PO TABS
25.0000 mg | ORAL_TABLET | Freq: Two times a day (BID) | ORAL | Status: DC
  Administered 2021-10-25 – 2021-10-27 (×6): 25 mg via ORAL
  Filled 2021-10-24: qty 1
  Filled 2021-10-24: qty 2
  Filled 2021-10-24 (×5): qty 1

## 2021-10-24 MED ORDER — CLONIDINE HCL 0.2 MG PO TABS
0.2000 mg | ORAL_TABLET | Freq: Two times a day (BID) | ORAL | Status: DC
  Administered 2021-10-24: 0.2 mg via ORAL
  Filled 2021-10-24: qty 1

## 2021-10-24 MED ORDER — ACETAMINOPHEN 325 MG PO TABS
650.0000 mg | ORAL_TABLET | Freq: Four times a day (QID) | ORAL | Status: DC | PRN
  Administered 2021-10-26 – 2021-10-30 (×2): 650 mg via ORAL
  Filled 2021-10-24 (×2): qty 2

## 2021-10-24 MED ORDER — LABETALOL HCL 5 MG/ML IV SOLN
10.0000 mg | Freq: Once | INTRAVENOUS | Status: AC
  Administered 2021-10-24: 10 mg via INTRAVENOUS
  Filled 2021-10-24: qty 4

## 2021-10-24 MED ORDER — AMLODIPINE BESYLATE 10 MG PO TABS
10.0000 mg | ORAL_TABLET | Freq: Every day | ORAL | Status: DC
  Administered 2021-10-25 – 2021-10-30 (×6): 10 mg via ORAL
  Filled 2021-10-24 (×2): qty 1
  Filled 2021-10-24: qty 2
  Filled 2021-10-24 (×3): qty 1

## 2021-10-24 MED ORDER — INSULIN ASPART 100 UNIT/ML IJ SOLN: 0.0000 [IU] | INTRAMUSCULAR | Status: DC

## 2021-10-24 NOTE — ED Triage Notes (Signed)
Pt has a bad headache in the back of her head. Pt is here in the urgent care with a pressure of 241/101. Pt says it hurts to even move her head.Pt is referred to hospital for further examination and further monitoring.

## 2021-10-24 NOTE — Assessment & Plan Note (Signed)
curretnly stable  continue aspirin 81 mgp o q day

## 2021-10-24 NOTE — ED Provider Notes (Signed)
60 year old female transferred from [place] for admission for hypertensive urgency. Physical Exam  BP (!) 199/75   Pulse 61   Temp 97.6 F (36.4 C)   Resp 15   Ht '5\' 4"'$  (1.626 m)   Wt 88.9 kg   LMP 12/14/2013   SpO2 91%   BMI 33.64 kg/m   Physical Exam  Procedures  Procedures  ED Course / MDM    Medical Decision Making Amount and/or Complexity of Data Reviewed Labs: ordered. Radiology: ordered.  Risk Prescription drug management. Decision regarding hospitalization.  Discussed with Dr. Bridgett Larsson with [facility] hospitalist service who is aware of patient transfer to [facility], request consult paged to hospitalist team the department. Case discussed with Dr. Soyla Imad Shostak on-call with nephrology who request hospitalist to admit, nephrology to see patient in the morning, recommends medical management overnight. Case discussed with Dr. Roel Cluck with [facility] hospitalist service will consult for admission, add on troponin.       Tacy Learn, PA-C 10/24/21 2123    Noemi Chapel, MD 10/27/21 [phone]

## 2021-10-24 NOTE — Assessment & Plan Note (Signed)
restart home meds

## 2021-10-24 NOTE — ED Triage Notes (Signed)
Pt arrives pov, referral from UC with c/o HA, HTN starting last night

## 2021-10-24 NOTE — ED Notes (Signed)
ED TO INPATIENT HANDOFF REPORT  ED Nurse Name and Phone #: Orpah Cobb Name/Age/Gender [name] 61 y.o. female Room/Bed: MH01/MH01  Code Status   Code Status: Prior  Home/SNF/Other Home Patient oriented to: self, place, time, and situation Is this baseline? Yes   Triage Complete: Triage complete  Chief Complaint high blood pressure  Triage Note Pt arrives pov, referral from UC with c/o HA, HTN starting last night   Allergies Allergies  Allergen Reactions   Doxazosin Mesylate     Increased heart rate   Rosuvastatin Other (See Comments)    myalgia   Simvastatin     myalgias   Wellbutrin [Bupropion Hcl] Other (See Comments)    Suicidal thoughts and increased depression   Zetia [Ezetimibe]     myalgias    Level of Care/Admitting Diagnosis ED Disposition     ED Disposition  Transfer via Transport   Condition  --   Comment  --         B Medical/Surgery History Past Medical History:  Diagnosis Date   Angina    CAD (coronary artery disease), non obstructive CAD in 2010    a. 2013 Cath: LAD 60, RCA 27m 70d; b. 2014 Cath: stable anatomy.   Chronic kidney disease    polycystic kidney disease , elevated creatine   [place]    [place]    a. 12/2014 HbA1c 6.5; b. Rx Glipizide - not taking.   Family history of premature CAD    GERD (gastroesophageal reflux disease)    Hx of adenomatous colonic polyps 06/11/2016   Hyperlipidemia    Hypertension    Hypertensive heart disease    Palpitations    Reflux    Sleep apnea    mild, no c-pap at this point   Tobacco abuse    Past Surgical History:  Procedure Laterality Date   APPENDECTOMY  1974   AV FISTULA PLACEMENT Left 02/19/2020   Procedure: LEFT ARM BRACHIOCEPHALIC  ARTERIOVENOUS (AV) FISTULA CREATION;  Surgeon: CWaynetta Sandy MD;  [facility]  Service: Vascular;  Laterality: Left;   CARDIAC CATHETERIZATION   02/07/2008   Recommendation-F/U stress test   CARDIAC CATHETERIZATION  04/16/2011   Recommendation-Increase medical therapy trial   CARDIOVASCULAR STRESS TEST  03/25/2011   Suspect subtle anterior septal ischemia   COLONOSCOPY     FISTULA SUPERFICIALIZATION Left 05/13/2020   Procedure: FISTULA SUPERFICIALIZATION LEFT;  Surgeon: CWaynetta Sandy MD;  [facility]  Service: Vascular;  Laterality: Left;   FRACTURE SURGERY Left 1972   forearm fracture   LEFT HEART CATHETERIZATION WITH CORONARY ANGIOGRAM N/A 04/16/2011   Procedure: LEFT HEART CATHETERIZATION WITH CORONARY ANGIOGRAM;  Surgeon: TTroy Sine MD;  [facility];  Service: Cardiovascular;  Laterality: N/A;   LEFT HEART CATHETERIZATION WITH CORONARY ANGIOGRAM N/A 09/01/2012   Procedure: LEFT HEART CATHETERIZATION WITH CORONARY ANGIOGRAM;  Surgeon: KPixie Casino MD;  [facility];  Service: Cardiovascular;  Laterality: N/A;   POLYPECTOMY     RENAL DOPPLER  06/05/2009   No evidence of significant diameter reduction   TRANSTHORACIC ECHOCARDIOGRAM  01/25/2008   EF 60-65%, Moderate concentric LVH   WRIST FRACTURE SURGERY Right ~ 1996     A IV Location/Drains/Wounds Patient Lines/Drains/Airways Status     Active Line/Drains/Airways     Name Placement date Placement time Site Days   Peripheral IV 10/24/21 Posterior;Right Hand 10/24/21  1739  Hand  less than 1   Fistula / Graft Left Upper arm Arteriovenous fistula 02/19/20  0815  Upper arm  613   Incision (Closed) 02/19/20 Arm Left 02/19/20  0816  -- 613   Incision (Closed) 05/13/20 Arm Left 05/13/20  0842  -- 529            Intake/Output Last 24 hours No intake or output data in the 24 hours ending 10/24/21 1943  Labs/Imaging Results for orders placed or performed during the hospital encounter of 10/24/21 (from the past 48 hour(s))  Urinalysis, Routine w reflex microscopic     Status: Abnormal   Collection Time: 10/24/21  5:00 PM  Result Value  Ref Range   Color, Urine STRAW (A) YELLOW   APPearance CLEAR CLEAR   Specific Gravity, Urine 1.020 1.005 - 1.030   pH 7.5 5.0 - 8.0   Glucose, UA 100 (A) NEGATIVE mg/dL   Hgb urine dipstick TRACE (A) NEGATIVE   Bilirubin Urine NEGATIVE NEGATIVE   Ketones, ur NEGATIVE NEGATIVE mg/dL   Protein, ur >=300 (A) NEGATIVE mg/dL   Nitrite NEGATIVE NEGATIVE   Leukocytes,Ua NEGATIVE NEGATIVE    Comment: Performed at [facility], [address] [place], [place] [address]  Urinalysis, Microscopic (reflex)     Status: Abnormal   Collection Time: 10/24/21  5:00 PM  Result Value Ref Range   RBC / HPF 6-10 0 - 5 RBC/hpf   WBC, UA 0-5 0 - 5 WBC/hpf   Bacteria, UA FEW (A) NONE SEEN   Squamous Epithelial / LPF 6-10 0 - 5    Comment: Performed at [facility], [address] [place], [place] [address]  CBC with Differential     Status: Abnormal   Collection Time: 10/24/21  5:38 PM  Result Value Ref Range   WBC 11.0 (H) 4.0 - 10.5 K/uL   RBC 2.83 (L) 3.87 - 5.11 MIL/uL   Hemoglobin 8.0 (L) 12.0 - 15.0 g/dL   HCT 25.4 (L) 36.0 - 46.0 %   MCV 89.8 80.0 - 100.0 fL   MCH 28.3 26.0 - 34.0 pg   MCHC 31.5 30.0 - 36.0 g/dL   RDW 14.2 11.5 - 15.5 %   Platelets 214 150 - 400 K/uL   nRBC 0.0 0.0 - 0.2 %   Neutrophils Relative % 75 %   Neutro Abs 8.3 (H) 1.7 - 7.7 K/uL   Lymphocytes Relative 13 %   Lymphs Abs 1.4 0.7 - 4.0 K/uL   Monocytes Relative 6 %   Monocytes Absolute 0.7 0.1 - 1.0 K/uL   Eosinophils Relative 4 %   Eosinophils Absolute 0.5 0.0 - 0.5 K/uL   Basophils Relative 1 %   Basophils Absolute 0.1 0.0 - 0.1 K/uL   Immature Granulocytes 1 %   Abs Immature Granulocytes 0.05 0.00 - 0.07 K/uL    Comment: Performed at [facility], [address] [place], [place] [address]  Comprehensive metabolic panel     Status: Abnormal   Collection Time: 10/24/21  5:38 PM  Result Value Ref Range   Sodium 137 135 - 145 mmol/L   Potassium 3.6 3.5 - 5.1 mmol/L   Chloride  101 98 - 111 mmol/L   CO2 22 22 - 32 mmol/L   Glucose, Bld 114 (H) 70 - 99 mg/dL    Comment: Glucose reference range applies only to samples taken after fasting for at least 8 hours.   BUN 99 (H) 6 -  20 mg/dL    Comment: RESULTS CONFIRMED BY MANUAL DILUTION   Creatinine, Ser 13.45 (H) 0.44 - 1.00 mg/dL   Calcium 7.9 (L) 8.9 - 10.3 mg/dL   Total Protein 7.2 6.5 - 8.1 g/dL   Albumin 3.6 3.5 - 5.0 g/dL   AST 15 15 - 41 U/L   ALT 16 0 - 44 U/L   Alkaline Phosphatase 35 (L) 38 - 126 U/L   Total Bilirubin 0.4 0.3 - 1.2 mg/dL   GFR, Estimated 3 (L) >60 mL/min    Comment: (NOTE) Calculated using the CKD-EPI Creatinine Equation (2021)    Anion gap 14 5 - 15    Comment: Performed at [facility], [address] [place], [place] [address]   DG Chest 2 View  Result Date: 10/24/2021 CLINICAL DATA:  Hypertension EXAM: CHEST - 2 VIEW COMPARISON:  Chest x-ray 10/22/2021 FINDINGS: The heart is enlarged, unchanged. The lungs are clear. There is no pleural effusion or pneumothorax. No acute fractures are seen. There is a stable calcified granuloma in the right upper lobe. IMPRESSION: 1. No active cardiopulmonary disease. 2. Stable cardiomegaly. Electronically Signed   By: Ronney Asters M.D.   On: 10/24/2021 17:43   CT Head Wo Contrast  Result Date: 10/24/2021 CLINICAL DATA:  Headache, hypertension EXAM: CT HEAD WITHOUT CONTRAST TECHNIQUE: Contiguous axial images were obtained from the base of the skull through the vertex without intravenous contrast. RADIATION DOSE REDUCTION: This exam was performed according to the departmental dose-optimization program which includes automated exposure control, adjustment of the mA and/or kV according to patient size and/or use of iterative reconstruction technique. COMPARISON:  None Available. FINDINGS: Brain: No evidence of acute infarction, hemorrhage, mass, mass effect, or midline shift. No hydrocephalus or extra-axial fluid collection. Vascular: No  hyperdense vessel. Skull: Normal. Negative for fracture or focal lesion. Sinuses/Orbits: No acute finding. Other: The mastoid air cells are well aerated. IMPRESSION: No acute intracranial process. Electronically Signed   By: Merilyn Baba M.D.   On: 10/24/2021 17:30   DG Chest Port 1 View  Result Date: 10/22/2021 CLINICAL DATA:  Weakness. EXAM: PORTABLE CHEST 1 VIEW COMPARISON:  08/24/2017. FINDINGS: Heart is enlarged and the mediastinal contour is within normal limits. Atherosclerotic calcification of the aorta is noted. Interstitial prominence is present bilaterally. No consolidation, effusion, or pneumothorax. No acute osseous abnormality. IMPRESSION: 1. Mild interstitial prominence bilaterally, possible edema or infiltrate. 2. Cardiomegaly. Electronically Signed   By: Brett Fairy M.D.   On: 10/22/2021 20:12    Pending Labs Unresulted Labs (From admission, onward)    None       Vitals/Pain Today's Vitals   10/24/21 1818 10/24/21 1819 10/24/21 1829 10/24/21 1900  BP: (!) 232/80  (!) 221/85 (!) 191/74  Pulse: 63   61  Resp: 20   19  Temp:      TempSrc:      SpO2: 94%   96%  Weight:      Height:      PainSc:  8       Isolation Precautions No active isolations  Medications Medications  labetalol (NORMODYNE) injection 10 mg (has no administration in time range)  cloNIDine (CATAPRES) tablet 0.2 mg (0.2 mg Oral Given 10/24/21 1731)  hydrALAZINE (APRESOLINE) injection 20 mg (20 mg Intravenous Given 10/24/21 1829)    Mobility walks Low fall risk   Focused Assessments    R Recommendations: See Admitting Provider Note  Report given to:   Additional Notes:  Pt  needs dialysis and there are no beds so pt is being accepted by Dr. Ursula Beath ED to ED transfer so pt may see nephrology and begin HD.

## 2021-10-24 NOTE — Assessment & Plan Note (Signed)
Order sliding scale  

## 2021-10-24 NOTE — ED Notes (Signed)
Dr. Doutova at bedside.  

## 2021-10-24 NOTE — ED Provider Notes (Signed)
[facility] EMERGENCY DEPARTMENT Provider Note   CSN: [id number] Arrival date & time: 10/24/21  1640     History  Chief Complaint  Patient presents with   Hypertension    [name] is a 61 y.o. female.  HPI   Patient with medical history including CAD, COPD, stage V chronic kidney disease currently has left AC fistula, diabetes, hypertension presents with complains of an elevated blood pressure.  Patient states that she has notics that her blood pressure been elevated for the last 3 days, states that she has been compliant with her blood pressure medication but without much relief, she states that she developed a slight headache states is in the back of her head, remains constant, no change in vision no paresthesias or weakness to the upper or lower extremities no recent head trauma, not anticoag's.  States that she was seen in the ED a few days ago and they got her blood pressure control but now is uncontrolled again she has not started dialysis yet.  Reviewed patient's chart was seen on Thursday noted be hypertensive at that time, she was given her home medications and her BP was controlled, they increased her hydralazine to 3 times daily.  Home Medications Prior to Admission medications   Medication Sig Start Date End Date Taking? Authorizing Provider  amLODipine (NORVASC) 10 MG tablet Take 1 tablet (10 mg total) by mouth daily. 10/22/21   Drenda Freeze, MD  aspirin 81 MG tablet Take 81 mg by mouth at bedtime.    [provider]  carvedilol (COREG) 25 MG tablet TAKE 1 TABLET BY MOUTH TWICE A DAY WITH A MEAL TO REPLACE METOPROLOL 06/26/21   Lorretta Harp, MD  Cholecalciferol (VITAMIN D3) 50 MCG (2000 UT) TABS Take 2,000 Units by mouth daily.    [provider]  cloNIDine (CATAPRES) 0.2 MG tablet Take 1 tablet (0.2 mg total) by mouth 2 (two) times daily. Appointment needed 03/03/21   Lorretta Harp, MD  [facility] IRON 325 (65 Fe) MG tablet Take  325 mg by mouth 2 (two) times daily with a meal. 04/04/20   [provider]  hydrALAZINE (APRESOLINE) 50 MG tablet Take 1 tablet (50 mg total) by mouth 3 (three) times daily. 10/22/21   Drenda Freeze, MD  [facility] Calcium 500 MG TABS Take 500 mg by mouth 2 (two) times daily. 04/18/19   [provider]  sevelamer carbonate (RENVELA) 800 MG tablet Take 1,600 mg by mouth 3 (three) times daily with meals. 04/24/21   [provider]  sodium bicarbonate 650 MG tablet Take 1,950 mg by mouth 2 (two) times daily. 04/04/20   [provider]      Allergies    Doxazosin mesylate, Rosuvastatin, Simvastatin, Wellbutrin [bupropion hcl], and Zetia [ezetimibe]    Review of Systems   Review of Systems  Constitutional:  Negative for chills and fever.  Respiratory:  Negative for shortness of breath.   Cardiovascular:  Negative for chest pain.  Gastrointestinal:  Negative for abdominal pain.  Neurological:  Positive for headaches.    Physical Exam Updated Vital Signs BP (!) 221/85   Pulse 63   Temp 97.7 F (36.5 C) (Oral)   Resp 20   Ht '5\' 4"'$  (1.626 m)   Wt 88.9 kg   LMP 12/14/2013   SpO2 94%   BMI 33.64 kg/m  Physical Exam Vitals and nursing note reviewed.  Constitutional:      General: She is not  in acute distress.    Appearance: She is not ill-appearing.  HENT:     Head: Normocephalic and atraumatic.     Nose: No congestion.  Eyes:     Conjunctiva/sclera: Conjunctivae normal.  Cardiovascular:     Rate and Rhythm: Normal rate and regular rhythm.     Pulses: Normal pulses.     Heart sounds: No murmur heard.    No friction rub. No gallop.  Pulmonary:     Effort: No respiratory distress.     Breath sounds: No wheezing, rhonchi or rales.  Abdominal:     General: There is no distension.     Palpations: Abdomen is soft.     Tenderness: There is no right CVA tenderness or left CVA tenderness.  Skin:    General: Skin is warm and dry.     Comments:  Left AC fistula palpable thrill no evidence of infection present.  Neurological:     Mental Status: She is alert.     Cranial Nerves: Cranial nerves 2-12 are intact. No cranial nerve deficit.     Sensory: Sensation is intact.     Motor: No weakness.     Coordination: Romberg sign negative. Finger-Nose-Finger Test normal.     Comments: Cranial nerves II through XII grossly intact no difficulty with word finding following two-step commands no unilateral weakness present.  Psychiatric:        Mood and Affect: Mood normal.     ED Results / Procedures / Treatments   Labs (all labs ordered are listed, but only abnormal results are displayed) Labs Reviewed  CBC WITH DIFFERENTIAL/PLATELET - Abnormal; Notable for the following components:      Result Value   WBC 11.0 (*)    RBC 2.83 (*)    Hemoglobin 8.0 (*)    HCT 25.4 (*)    Neutro Abs 8.3 (*)    All other components within normal limits  COMPREHENSIVE METABOLIC PANEL - Abnormal; Notable for the following components:   Glucose, Bld 114 (*)    BUN 99 (*)    Creatinine, Ser 13.45 (*)    Calcium 7.9 (*)    Alkaline Phosphatase 35 (*)    GFR, Estimated 3 (*)    All other components within normal limits  URINALYSIS, ROUTINE W REFLEX MICROSCOPIC - Abnormal; Notable for the following components:   Color, Urine STRAW (*)    Glucose, UA 100 (*)    Hgb urine dipstick TRACE (*)    Protein, ur >=300 (*)    All other components within normal limits  URINALYSIS, MICROSCOPIC (REFLEX) - Abnormal; Notable for the following components:   Bacteria, UA FEW (*)    All other components within normal limits    EKG EKG Interpretation  Date/Time:  Saturday October 24 2021 16:56:07 EDT Ventricular Rate:  62 PR Interval:  188 QRS Duration: 86 QT Interval:  450 QTC Calculation: 456 R Axis:   83 Text Interpretation: Normal sinus rhythm Normal ECG When compared with ECG of 22-Oct-2021 14:36, PREVIOUS ECG IS PRESENT No significant change since last  tracing Confirmed by Isla Pence [phone]) on 10/24/2021 5:01:52 PM  Radiology DG Chest 2 View  Result Date: 10/24/2021 CLINICAL DATA:  Hypertension EXAM: CHEST - 2 VIEW COMPARISON:  Chest x-ray 10/22/2021 FINDINGS: The heart is enlarged, unchanged. The lungs are clear. There is no pleural effusion or pneumothorax. No acute fractures are seen. There is a stable calcified granuloma in the right upper lobe. IMPRESSION: 1. No active cardiopulmonary disease.  2. Stable cardiomegaly. Electronically Signed   By: Ronney Asters M.D.   On: 10/24/2021 17:43   CT Head Wo Contrast  Result Date: 10/24/2021 CLINICAL DATA:  Headache, hypertension EXAM: CT HEAD WITHOUT CONTRAST TECHNIQUE: Contiguous axial images were obtained from the base of the skull through the vertex without intravenous contrast. RADIATION DOSE REDUCTION: This exam was performed according to the departmental dose-optimization program which includes automated exposure control, adjustment of the mA and/or kV according to patient size and/or use of iterative reconstruction technique. COMPARISON:  None Available. FINDINGS: Brain: No evidence of acute infarction, hemorrhage, mass, mass effect, or midline shift. No hydrocephalus or extra-axial fluid collection. Vascular: No hyperdense vessel. Skull: Normal. Negative for fracture or focal lesion. Sinuses/Orbits: No acute finding. Other: The mastoid air cells are well aerated. IMPRESSION: No acute intracranial process. Electronically Signed   By: Merilyn Baba M.D.   On: 10/24/2021 17:30   DG Chest Port 1 View  Result Date: 10/22/2021 CLINICAL DATA:  Weakness. EXAM: PORTABLE CHEST 1 VIEW COMPARISON:  08/24/2017. FINDINGS: Heart is enlarged and the mediastinal contour is within normal limits. Atherosclerotic calcification of the aorta is noted. Interstitial prominence is present bilaterally. No consolidation, effusion, or pneumothorax. No acute osseous abnormality. IMPRESSION: 1. Mild interstitial  prominence bilaterally, possible edema or infiltrate. 2. Cardiomegaly. Electronically Signed   By: Brett Fairy M.D.   On: 10/22/2021 20:12    Procedures Procedures    Medications Ordered in ED Medications  labetalol (NORMODYNE) injection 10 mg (has no administration in time range)  cloNIDine (CATAPRES) tablet 0.2 mg (0.2 mg Oral Given 10/24/21 1731)  hydrALAZINE (APRESOLINE) injection 20 mg (20 mg Intravenous Given 10/24/21 1829)    ED Course/ Medical Decision Making/ A&P                           Medical Decision Making Amount and/or Complexity of Data Reviewed Labs: ordered. Radiology: ordered.  Risk Prescription drug management. Decision regarding hospitalization.   This patient presents to the ED for concern of elevated BP, this involves an extensive number of treatment options, and is a complaint that carries with it a high risk of complications and morbidity.  The differential diagnosis includes to the emergency/urgency, intracranial bleed, CVA, emergent hemodialysis    Additional history obtained:  Additional history obtained from N/A External records from outside source obtained and reviewed including recent ER note, vascular notes   Co morbidities that complicate the patient evaluation  CKD stage V  Social Determinants of Health:  N/A    Lab Tests:  I Ordered, and personally interpreted labs.  The pertinent results include: UA is unremarkable   Imaging Studies ordered:  I ordered imaging studies including CT head, chest x-ray I independently visualized and interpreted imaging which showed CT head is negative, chest x-ray unremarkable I agree with the radiologist interpretation   Cardiac Monitoring:  The patient was maintained on a cardiac monitor.  I personally viewed and interpreted the cardiac monitored which showed an underlying rhythm of: Without signs of ischemia   Medicines ordered and prescription drug management:  I ordered  medication including clonidine I have reviewed the patients home medicines and have made adjustments as needed  Critical Interventions:  N/A   Reevaluation: Presents with headache, she had elevated BP with a systolic in the 740C, no focal deficits, will obtain CT imaging for rule out of intracranial bleed, basic lab work-up provide with her home medications and reassess.  Patient was reassessed after clonidine BP is improved to 232/80, will provide her with IV dose of hydralazine, her BMP shows continued increase in her creatinine, will consult with neurology for possibility of starting her on dialysis today.  Updated patient recommendations from nephrology agreement this plan will consult hospitalist for admission.    Consultations Obtained:  I requested consultation with the spoke with Dr. Soyla Murphy,  and discussed lab and imaging findings as well as pertinent plan - they recommend: He recommends admission for treatment of hypertensive urgency and likely start dialysis during this admission.    Test Considered:  N/A    Rule out Low suspicion for intracranial bleed as CT imaging obtained for this.  I doubt CVA as she has no neuro deficits present my exam.  Suspicion for dissection is also low at this time as she is not endorsing significant headaches, she has no focal deficits no neurological symptoms.  I have low suspicion for congestive heart failure she is understanding chest pain, she does not appear to be volume overloaded on my exam lung sounds are clear bilaterally.    Dispostion and problem list  Due to shift change patient will be handed off to Dr. Lorretta Harp, she will admit the patient to medicine for treatment of hypertensive urgency and likely dialysis treatment during her hospital stay will need formal evaluation by nephrology.  After consideration of the diagnostic results and the patients response to treatment, I feel that the patent would benefit from  admission.  Hypertension emergency-likely this is multifactorial continuing failing dialysis as well noncompliant with medications, will need to continue to monitor patient's BP. End-stage renal disease-she is having worsening kidney function as well as BUN, will likely need to start dialysis during this hospital admission will need formal consultation by nephrology upon arrival at [facility].            Final Clinical Impression(s) / ED Diagnoses Final diagnoses:  Hypertensive urgency  End stage renal disease [facility])    Rx / DC Orders ED Discharge Orders     None         Aron Baba 10/24/21 1901    Isla Pence, MD 10/24/21 Lattie Corns    Isla Pence, MD 10/24/21 [phone]

## 2021-10-24 NOTE — ED Provider Notes (Signed)
Seen briefly as she is being triaged.  Her blood pressure is 240/101.  She has had a headache since last night.  She does say that it hurts worse to turn her head.  She is on treatment for hypertension.   With the severe degree of blood pressure elevation, I am asking her to proceed to the emergency room for higher level of care and evaluation then we can provide for her in the urgent care setting   Barrett Henle, MD 10/24/21 [phone]

## 2021-10-24 NOTE — ED Notes (Signed)
Pt arrives via [facility] as a transfer from [facility] where she was seen for HTN and Headache. Last BP was 224/94 and still reports headache. She was transferred to [facility] to be seen by Nephrology and to receive dialysis. Creatinine was 13.45 and BUN 99. She has has never been on dialysis before but has a fistula to left arm that has not been used yet.

## 2021-10-24 NOTE — ED Notes (Signed)
Pt was told to go to the ER from provider.

## 2021-10-24 NOTE — H&P (Signed)
[name] NWG:[id number] DOB: 04-Feb-1960 DOA: 10/24/2021     PCP: Patient, No Pcp Per   Outpatient Specialists:  CARDS:   Dr.Berry NEphrology:  Dr.Bendary NEurology *   Dr. Pulmonary *  Dr.  Oncology * Dr. Fabienne Bruns* Dr.  Sadie Haber, [facility]) No care team member to display Urology Dr. *  Patient arrived to ER on 10/24/21 at 1640 Referred by Attending Isla Pence, MD   Patient coming from:    home Lives With family From facility    Chief Complaint:   Chief Complaint  Patient presents with   Hypertension    HPI: [name] is a 61 y.o. female with medical history significant of CKD stage V, CAD, COPD, DM2, HTN,   Presented with headache HTN  Came in report of HTN and headache Bp was 224/94 Has a fistula in left arm She still makes urine    She has not been on dialyses She still smokes on occasion but does not drink     Regarding pertinent Chronic problems:    Hyperlipidemia -  on statins  Lipid Panel     Component Value Date/Time   CHOL 183 10/05/2018 0923   TRIG 251 (H) 10/05/2018 0923   HDL 29 (L) 10/05/2018 0923   CHOLHDL 6.3 (H) 10/05/2018 0923   CHOLHDL 7.0 (H) 04/01/2016 0847   VLDL 52 (H) 04/01/2016 0847   [place] 110 (H) 10/05/2018 0923   LABVLDL 44 (H) 10/05/2018 0923     HTN on NOrvasc, coreg, Clonidine, Hydralazine   chronic CHF diastolic  - last [id number] Grade I diastolic dysfunction (impaired  relaxation).       DM 2 -  Lab Results  Component Value Date   HGBA1C 6.3 (H) 08/23/2017   diet controlled    COPD -  not  on baseline oxygen      CKD stageV- baseline Cr 13 Estimated Creatinine Clearance: 4.9 mL/min (A) (by C-G formula based on SCr of 13.45 mg/dL (H)).  Lab Results  Component Value Date   CREATININE 13.45 (H) 10/24/2021   CREATININE 13.71 (H) 10/22/2021   CREATININE 6.07 (H) 08/10/2020   Chronic anemia - baseline hg Hemoglobin & Hematocrit  Recent Labs    10/22/21 1111 10/22/21 1432 10/24/21 1738  HGB  8.3* 7.4* 8.0*     While in ER:   Noted to be persistently hypertensive home meds were given  She ahas been taking her meds   Ordered  CT HEAD   NON acute  CXR -  NON acute    Following Medications were ordered in ER: Medications  cloNIDine (CATAPRES) tablet 0.2 mg (0.2 mg Oral Given 10/24/21 1731)  hydrALAZINE (APRESOLINE) injection 20 mg (20 mg Intravenous Given 10/24/21 1829)  labetalol (NORMODYNE) injection 10 mg (10 mg Intravenous Given 10/24/21 1952)    _______________________________________________________ ER Provider Called:   nephrology   Dr. Jonnie Finner They Recommend admit to medicine   Will see in AM     ED Triage Vitals  Enc Vitals Group     BP 10/24/21 1647 (!) 269/99     Pulse Rate 10/24/21 1647 64     Resp 10/24/21 1647 19     Temp 10/24/21 1647 97.7 F (36.5 C)     Temp Source 10/24/21 1647 Oral     SpO2 10/24/21 1647 95 %     Weight 10/24/21 1647 196 lb (88.9 kg)     Height 10/24/21 1647 '5\' 4"'$  (1.626 m)     Head  Circumference --      Peak Flow --      Pain Score 10/24/21 1653 9     Pain Loc --      Pain Edu? --      Excl. in [place]? --   TMAX(24)@     _________________________________________ Significant initial  Findings: Abnormal Labs Reviewed  CBC WITH DIFFERENTIAL/PLATELET - Abnormal; Notable for the following components:      Result Value   WBC 11.0 (*)    RBC 2.83 (*)    Hemoglobin 8.0 (*)    HCT 25.4 (*)    Neutro Abs 8.3 (*)    All other components within normal limits  COMPREHENSIVE METABOLIC PANEL - Abnormal; Notable for the following components:   Glucose, Bld 114 (*)    BUN 99 (*)    Creatinine, Ser 13.45 (*)    Calcium 7.9 (*)    Alkaline Phosphatase 35 (*)    GFR, Estimated 3 (*)    All other components within normal limits  URINALYSIS, ROUTINE W REFLEX MICROSCOPIC - Abnormal; Notable for the following components:   Color, Urine STRAW (*)    Glucose, UA 100 (*)    Hgb urine dipstick TRACE (*)    Protein, ur >=300 (*)     All other components within normal limits  URINALYSIS, MICROSCOPIC (REFLEX) - Abnormal; Notable for the following components:   Bacteria, UA FEW (*)    All other components within normal limits     _________________________ Troponin  ordered ECG: Ordered Personally reviewed and interpreted by me showing: HR : 62 Rhythm:    Normal sinus rhythm Normal ECG  QTC 456    The recent clinical data is shown below. Vitals:   10/24/21 1900 10/24/21 1930 10/24/21 2000 10/24/21 2056  BP: (!) 191/74 (!) 201/79 (!) 199/75   Pulse: 61 63 61   Resp: '19 13 15   '$ Temp:    97.6 F (36.4 C)  TempSrc:      SpO2: 96% 96% 91%   Weight:      Height:         WBC     Component Value Date/Time   WBC 11.0 (H) 10/24/2021 1738   LYMPHSABS 1.4 10/24/2021 1738   LYMPHSABS 2.0 08/10/2020 1512   MONOABS 0.7 10/24/2021 1738   EOSABS 0.5 10/24/2021 1738   EOSABS 0.5 (H) 08/10/2020 1512   BASOSABS 0.1 10/24/2021 1738   BASOSABS 0.1 08/10/2020 1512       UA   no evidence of UTI      Urine analysis:    Component Value Date/Time   COLORURINE STRAW (A) 10/24/2021 1700   APPEARANCEUR CLEAR 10/24/2021 1700   APPEARANCEUR Clear 10/24/2017 1710   LABSPEC 1.020 10/24/2021 1700   PHURINE 7.5 10/24/2021 1700   GLUCOSEU 100 (A) 10/24/2021 1700   HGBUR TRACE (A) 10/24/2021 1700   BILIRUBINUR NEGATIVE 10/24/2021 1700   BILIRUBINUR negative 08/10/2020 1452   BILIRUBINUR Negative 10/24/2017 1710   KETONESUR NEGATIVE 10/24/2021 1700   PROTEINUR >=300 (A) 10/24/2021 1700   UROBILINOGEN 0.2 08/10/2020 1452   NITRITE NEGATIVE 10/24/2021 1700   LEUKOCYTESUR NEGATIVE 10/24/2021 1700    Results for orders placed or performed during the hospital encounter of 08/10/20  Urine Culture     Status: Abnormal   Collection Time: 08/10/20  3:03 PM   Specimen: Urine, Clean Catch  Result Value Ref Range Status   Specimen Description URINE, CLEAN CATCH  Final   Special Requests  Final    NONE Performed at [facility], [place] [address] [place], [place] [address]    Culture MULTIPLE SPECIES PRESENT, SUGGEST RECOLLECTION (A)  Final   Report Status 08/12/2020 FINAL  Final     _______________________________________________ Hospitalist was called for admission for   Hypertensive urgency    End stage renal disease ([place])     The following Work up has been ordered so far:  Orders Placed This Encounter  Procedures   DG Chest 2 View   CT Head Wo Contrast   CBC with Differential   Comprehensive metabolic panel   Urinalysis, Routine w reflex microscopic   Urinalysis, Microscopic (reflex)   Repeat Vital Signs   Consult to nephrology   Consult to hospitalist   Consult to nephrology   Consult to hospitalist   EKG 12-Lead   ED EKG     OTHER Significant initial  Findings:  labs showing:    Recent Labs  Lab 10/22/21 1432 10/24/21 1738  NA 138 137  K 3.9 3.6  CO2 25 22  GLUCOSE 102* 114*  BUN 107* 99*  CREATININE 13.71* 13.45*  CALCIUM 8.3* 7.9*    Cr    stable,  Lab Results  Component Value Date   CREATININE 13.45 (H) 10/24/2021   CREATININE 13.71 (H) 10/22/2021   CREATININE 6.07 (H) 08/10/2020    Recent Labs  Lab 10/22/21 1432 10/24/21 1738  AST 14* 15  ALT 18 16  ALKPHOS 37* 35*  BILITOT 0.7 0.4  PROT 6.0* 7.2  ALBUMIN 3.1* 3.6   Lab Results  Component Value Date   CALCIUM 7.9 (L) 10/24/2021        Plt: Lab Results  Component Value Date   PLT 214 10/24/2021     COVID-19 Labs  No results for input(s): "DDIMER", "FERRITIN", "LDH", "CRP" in the last 72 hours.  Lab Results  Component Value Date   SARSCOV2NAA NEGATIVE 05/12/2020   SARSCOV2NAA NEGATIVE 02/18/2020   SARSCOV2NAA RESULT: NEGATIVE 07/04/2019   Venous  Blood Gas result:   7.451 High  Sodium 140 mmol/L  pCO2, Ven 29.2 Low  mmHg Potassium 3.4 Low  mmol/L  pO2, Ven 56 High  mmHg      ABG    Component Value Date/Time   TCO2 23 05/13/2020 0620      Recent Labs  Lab 10/22/21 1111  10/22/21 1432 10/24/21 1738  WBC  --  8.8 11.0*  NEUTROABS  --  6.1 8.3*  HGB 8.3* 7.4* 8.0*  HCT  --  23.2* 25.4*  MCV  --  91.7 89.8  PLT  --  186 214    HG/HCT  stable,      Component Value Date/Time   HGB 8.0 (L) 10/24/2021 1738   HGB 11.1 08/10/2020 1512   HCT 25.4 (L) 10/24/2021 1738   HCT 33.8 (L) 08/10/2020 1512   MCV 89.8 10/24/2021 1738   MCV 87 08/10/2020 1512            Cultures:    Component Value Date/Time   SDES URINE, CLEAN CATCH 08/10/2020 1503   SPECREQUEST  08/10/2020 1503    NONE Performed at [facility], [place] [address] [place], [place] [address]    CULT MULTIPLE SPECIES PRESENT, SUGGEST RECOLLECTION (A) 08/10/2020 1503   REPTSTATUS 08/12/2020 FINAL 08/10/2020 1503     Radiological Exams on Admission: DG Chest 2 View  Result Date: 10/24/2021 CLINICAL DATA:  Hypertension EXAM: CHEST - 2 VIEW COMPARISON:  Chest x-ray 10/22/2021 FINDINGS: The heart is enlarged, unchanged. The lungs are clear. There is no pleural effusion or pneumothorax. No acute fractures are seen. There is a stable calcified granuloma in the right upper lobe. IMPRESSION: 1. No active cardiopulmonary disease. 2. Stable cardiomegaly. Electronically Signed   By: Ronney Asters M.D.   On: 10/24/2021 17:43   CT Head Wo Contrast  Result Date: 10/24/2021 CLINICAL DATA:  Headache, hypertension EXAM: CT HEAD WITHOUT CONTRAST TECHNIQUE: Contiguous axial images were obtained from the base of the skull through the vertex without intravenous contrast. RADIATION DOSE REDUCTION: This exam was performed according to the departmental dose-optimization program which includes automated exposure control, adjustment of the mA and/or kV according to patient size and/or use of iterative reconstruction technique. COMPARISON:  None Available. FINDINGS: Brain: No evidence of acute infarction, hemorrhage, mass, mass effect, or midline shift. No hydrocephalus or extra-axial fluid collection. Vascular: No  hyperdense vessel. Skull: Normal. Negative for fracture or focal lesion. Sinuses/Orbits: No acute finding. Other: The mastoid air cells are well aerated. IMPRESSION: No acute intracranial process. Electronically Signed   By: Merilyn Baba M.D.   On: 10/24/2021 17:30   _______________________________________________________________________________________________________ Latest  Blood pressure (!) 199/75, pulse 61, temperature 97.6 F (36.4 C), resp. rate 15, height '5\' 4"'$  (1.626 m), weight 88.9 kg, last menstrual period 12/14/2013, SpO2 91 %.   Vitals  labs and radiology finding personally reviewed  Review of Systems:    Pertinent positives include:   headaches,   Constitutional:  No weight loss, night sweats, Fevers, chills, fatigue, weight loss  HEENT:  NoDifficulty swallowing,Tooth/dental problems,Sore throat,  No sneezing, itching, ear ache, nasal congestion, post nasal drip,  Cardio-vascular:  No chest pain, Orthopnea, PND, anasarca, dizziness, palpitations.no Bilateral lower extremity swelling  GI:  No heartburn, indigestion, abdominal pain, nausea, vomiting, diarrhea, change in bowel habits, loss of appetite, melena, blood in stool, hematemesis Resp:  no shortness of breath at rest. No dyspnea on exertion, No excess mucus, no productive cough, No non-productive cough, No coughing up of blood.No change in color of mucus.No wheezing. Skin:  no rash or lesions. No jaundice GU:  no dysuria, change in color of urine, no urgency or frequency. No straining to urinate.  No flank pain.  Musculoskeletal:  No joint pain or no joint swelling. No decreased range of motion. No back pain.  Psych:  No change in mood or affect. No depression or anxiety. No memory loss.  Neuro: no localizing neurological complaints, no tingling, no weakness, no double vision, no gait abnormality, no slurred speech, no confusion  All systems reviewed and apart from [place] all are  negative _______________________________________________________________________________________________ Past Medical History:   Past Medical History:  Diagnosis Date   Angina    CAD (coronary artery disease), non obstructive CAD in 2010    a. 2013 Cath: LAD 60, RCA 53m 70d; b. 2014 Cath: stable anatomy.   Chronic kidney disease    polycystic kidney disease , elevated creatine   COPD (chronic obstructive pulmonary disease) ([place]    Diabetes mellitus without complication ([place]    a. 12/2014 HbA1c 6.5; b. Rx Glipizide - not taking.   Family history of premature CAD    GERD (gastroesophageal reflux disease)    Hx of adenomatous colonic polyps 06/11/2016   Hyperlipidemia    Hypertension    Hypertensive heart disease    Palpitations    Reflux    Sleep apnea    mild, no c-pap at this point  Tobacco abuse       Past Surgical History:  Procedure Laterality Date   APPENDECTOMY  1974   AV FISTULA PLACEMENT Left 02/19/2020   Procedure: LEFT ARM BRACHIOCEPHALIC  ARTERIOVENOUS (AV) FISTULA CREATION;  Surgeon: Waynetta Sandy, MD;  Location: [facility];  Service: Vascular;  Laterality: Left;   CARDIAC CATHETERIZATION  02/07/2008   Recommendation-F/U stress test   CARDIAC CATHETERIZATION  04/16/2011   Recommendation-Increase medical therapy trial   CARDIOVASCULAR STRESS TEST  03/25/2011   Suspect subtle anterior septal ischemia   COLONOSCOPY     FISTULA SUPERFICIALIZATION Left 05/13/2020   Procedure: FISTULA SUPERFICIALIZATION LEFT;  Surgeon: Waynetta Sandy, MD;  Location: [facility];  Service: Vascular;  Laterality: Left;   FRACTURE SURGERY Left 1972   forearm fracture   LEFT HEART CATHETERIZATION WITH CORONARY ANGIOGRAM N/A 04/16/2011   Procedure: LEFT HEART CATHETERIZATION WITH CORONARY ANGIOGRAM;  Surgeon: Troy Sine, MD;  Location: [facility] CATH LAB;  Service: Cardiovascular;  Laterality: N/A;   LEFT HEART CATHETERIZATION WITH CORONARY ANGIOGRAM N/A 09/01/2012   Procedure: LEFT  HEART CATHETERIZATION WITH CORONARY ANGIOGRAM;  Surgeon: Pixie Casino, MD;  Location: [facility] CATH LAB;  Service: Cardiovascular;  Laterality: N/A;   POLYPECTOMY     RENAL DOPPLER  06/05/2009   No evidence of significant diameter reduction   TRANSTHORACIC ECHOCARDIOGRAM  01/25/2008   EF 60-65%, Moderate concentric LVH   WRIST FRACTURE SURGERY Right ~ 1996    Social History:  Ambulatory   independently      reports that she has been smoking cigarettes. She has a 7.50 pack-year smoking history. She has never used smokeless tobacco. She reports that she does not drink alcohol and does not use drugs.     Family History:   Family History  Problem Relation Age of Onset   Stroke Father    Hypertension Father    Coronary artery disease Brother    Diabetes Mother    Stroke Maternal Grandmother    Cancer Maternal Grandmother        Breast cancer   Hypertension Maternal Grandfather    Heart attack Maternal Grandfather    Heart attack Maternal Aunt 41   Colon cancer Neg Hx    Esophageal cancer Neg Hx    Stomach cancer Neg Hx    Rectal cancer Neg Hx    ______________________________________________________________________________________________ Allergies: Allergies  Allergen Reactions   Doxazosin Mesylate     Increased heart rate   Rosuvastatin Other (See Comments)    myalgia   Simvastatin     myalgias   Wellbutrin [Bupropion Hcl] Other (See Comments)    Suicidal thoughts and increased depression   Zetia [Ezetimibe]     myalgias     Prior to Admission medications   Medication Sig Start Date End Date Taking? Authorizing Provider  amLODipine (NORVASC) 10 MG tablet Take 1 tablet (10 mg total) by mouth daily. 10/22/21   Drenda Freeze, MD  aspirin 81 MG tablet Take 81 mg by mouth at bedtime.    [provider]  carvedilol (COREG) 25 MG tablet TAKE 1 TABLET BY MOUTH TWICE A DAY WITH A MEAL TO REPLACE METOPROLOL 06/26/21   Lorretta Harp, MD  Cholecalciferol  (VITAMIN D3) 50 MCG (2000 UT) TABS Take 2,000 Units by mouth daily.    [provider]  cloNIDine (CATAPRES) 0.2 MG tablet Take 1 tablet (0.2 mg total) by mouth 2 (two) times daily. Appointment needed 03/03/21   Lorretta Harp, MD  [facility]  IRON 325 (65 Fe) MG tablet Take 325 mg by mouth 2 (two) times daily with a meal. 04/04/20   [provider]  hydrALAZINE (APRESOLINE) 50 MG tablet Take 1 tablet (50 mg total) by mouth 3 (three) times daily. 10/22/21   Drenda Freeze, MD  [facility] Calcium 500 MG TABS Take 500 mg by mouth 2 (two) times daily. 04/18/19   [provider]  sevelamer carbonate (RENVELA) 800 MG tablet Take 1,600 mg by mouth 3 (three) times daily with meals. 04/24/21   [provider]  sodium bicarbonate 650 MG tablet Take 1,950 mg by mouth 2 (two) times daily. 04/04/20   [provider]    ___________________________________________________________________________________________________ Physical Exam:    10/24/2021    8:00 PM 10/24/2021    7:30 PM 10/24/2021    7:00 PM  Vitals with BMI  Systolic 474 259 563  Diastolic 75 79 74  Pulse 61 63 61     1. General:  in No  Acute distress   Chronically ill   -appearing 2. Psychological: Alert and   Oriented 3. Head/ENT Dry Mucous Membranes                          Head Non traumatic, neck supple                      Poor Dentition 4. SKIN: normal  Skin turgor,  Skin clean Dry and intact no rash 5. Heart: Regular rate and rhythm no  Murmur, no Rub or gallop 6. Lungs:  no wheezes or crackles   7. Abdomen: Soft,  non-tender, Non distended   obese  bowel sounds present 8. Lower extremities: no clubbing, cyanosis, no  edema 9. Neurologically Grossly intact, moving all 4 extremities equally   10. MSK: Normal range of motion    Chart has been reviewed  ______________________________________________________________________________________________  Assessment/Plan 61 y.o. female  with medical history significant of CKD stage V, CAD, COPD, DM2, HTN,   Admitted for   Hypertensive urgency  End stage renal disease       Present on Admission:  Hypertensive urgency  CAD (coronary artery disease)  Essential hypertension  DM (diabetes mellitus), type 2 with renal complications ([place])  CKD (chronic kidney disease), stage V ([place])     CAD (coronary artery disease) curretnly stable  continue aspirin 37 mgp o q day  Essential hypertension restart home meds  Hypertensive urgency restrt home meds Avoid sudden BP drop   nephrology will see and start on HD in AM Obtain troponin  DM (diabetes mellitus), type 2 with renal complications ([place]) Order sliding scale  CKD (chronic kidney disease), stage V [facility]) Nephrology aware will see in AM [place] to start HD ths admit    Other plan as per orders.  DVT prophylaxis:  SCD     Code Status:    Code Status: Prior FULL CODE as per patient   I had personally discussed CODE STATUS with patient      Family Communication:   Family not at  Bedside    Disposition Plan:         To home once workup is complete and patient is stable   Following barriers for discharge:  Will need consultants to evaluate patient prior to discharge                       Would benefit from PT/OT eval prior to [place] called: nephrology is aware wil see in consult  Admission status:  ED Disposition     ED Disposition  Admit   Condition  --   Comment  The patient appears reasonably stabilized for admission considering the current resources, flow, and capabilities available in the ED at this time, and I doubt any other [facility] requiring further screening and/or treatment in the ED prior to admission is  present.            inpatient     I Expect 2 midnight stay secondary to severity of patient's current illness need for inpatient  interventions justified by the following:  hemodynamic instability despite optimal treatment (hypertension)   Severe lab/radiological/exam abnormalities including:    AKI cr up to 13 and extensive comorbidities including:  DM2   CKD    That are currently affecting medical management.   I expect  patient to be hospitalized for 2 midnights requiring inpatient medical care.  Patient is at high risk for adverse outcome (such as loss of life or disability) if not treated.  Indication for inpatient stay as follows:    Hemodynamic instability despite maximal medical therapy,  ongoing suicidal ideations,    Need for operative/procedural  intervention  [place] for HD  Need for  IV antihypertensives,      Level of care      progressive tele indefinitely please discontinue once patient no longer qualifies COVID-19 Labs      Clancy Leiner 10/24/2021, 11:26 PM    [facility]     after 2 AM please page floor coverage PA If 7AM-7PM, please contact the day team taking care of the patient using [email or web address]   Patient was evaluated in the context of the global COVID-19 pandemic, which necessitated consideration that the patient might be at risk for infection with the SARS-CoV-2 virus that causes COVID-19. Institutional protocols and algorithms that pertain to the evaluation of patients at risk for COVID-19 are in a state of rapid change based on information released by regulatory bodies including the CDC and federal and state organizations. These policies and algorithms were followed during the patient's care.

## 2021-10-24 NOTE — ED Notes (Signed)
Pt has wallet and charging cord at bedside, retrieved from pts car by security Mickel Baas with witness Orlando Penner, [place]

## 2021-10-24 NOTE — Consult Note (Incomplete)
Renal Service Consult Note [facility]  [name] 10/25/2021 Sol Blazing, MD Requesting Physician: Dr. Gilford Raid  Reason for Consult: Renal failure HPI: The patient is a 61 y.o. year-old w/ hx of CAD, COPD, DM2, HL, HTN, OSA, tobacco use and CKD 5 who presented to ED c/o of a bad headache. In ED BP's were quite high around 269/99 and 210/ 166, HR 60-70 and RR 13-20, afebrile. RA 95%. CXR negative and BUN 99 and creat 13.4, CO2 22 , K+ 3.6, Na 137.  Hb 8.0, WBC 11K. Pt is being admitted for BP control and possible need for initiation of dialysis. Asked to see for the latter.   Pt has an AVF in the left arm created in Feb 2022 and revised in May 2022. She is followed by Dr Carolin Sicks at [facility].   She denies being "sick", and in no N/V, but she does endorse significant "fatigue" and loss of appetite. No jerking or confusion.  ROS - denies CP, no joint pain, no HA, no blurry vision, no rash, no diarrhea, no nausea/ vomiting, no dysuria, no difficulty voiding   Past Medical History  Past Medical History:  Diagnosis Date   Angina    CAD (coronary artery disease), non obstructive CAD in 2010    a. 2013 Cath: LAD 60, RCA 43m 70d; b. 2014 Cath: stable anatomy.   Chronic kidney disease    polycystic kidney disease , elevated creatine   COPD (chronic obstructive pulmonary disease) ([facility]    Diabetes mellitus without complication ([facility]    a. 12/2014 HbA1c 6.5; b. Rx Glipizide - not taking.   Family history of premature CAD    GERD (gastroesophageal reflux disease)    Hx of adenomatous colonic polyps 06/11/2016   Hyperlipidemia    Hypertension    Hypertensive heart disease    Palpitations    Reflux    Sleep apnea    mild, no c-pap at this point   Tobacco abuse    Past Surgical History  Past Surgical History:  Procedure Laterality Date   APPENDECTOMY  1974   AV FISTULA PLACEMENT Left 02/19/2020   Procedure: LEFT ARM BRACHIOCEPHALIC  ARTERIOVENOUS (AV) FISTULA  CREATION;  Surgeon: CWaynetta Sandy MD;  Location: [facility]  Service: Vascular;  Laterality: Left;   CARDIAC CATHETERIZATION  02/07/2008   Recommendation-F/U stress test   CARDIAC CATHETERIZATION  04/16/2011   Recommendation-Increase medical therapy trial   CARDIOVASCULAR STRESS TEST  03/25/2011   Suspect subtle anterior septal ischemia   COLONOSCOPY     FISTULA SUPERFICIALIZATION Left 05/13/2020   Procedure: FISTULA SUPERFICIALIZATION LEFT;  Surgeon: CWaynetta Sandy MD;  Location[facility]  Service: Vascular;  Laterality: Left;   FRACTURE SURGERY Left 1972   forearm fracture   LEFT HEART CATHETERIZATION WITH CORONARY ANGIOGRAM N/A 04/16/2011   Procedure: LEFT HEART CATHETERIZATION WITH CORONARY ANGIOGRAM;  Surgeon: TTroy Sine MD;  [facility];  Service: Cardiovascular;  Laterality: N/A;   LEFT HEART CATHETERIZATION WITH CORONARY ANGIOGRAM N/A 09/01/2012   Procedure: LEFT HEART CATHETERIZATION WITH CORONARY ANGIOGRAM;  Surgeon: KPixie Casino MD;  [facility];  Service: Cardiovascular;  Laterality: N/A;   POLYPECTOMY     RENAL DOPPLER  06/05/2009   No evidence of significant diameter reduction   TRANSTHORACIC ECHOCARDIOGRAM  01/25/2008   EF 60-65%, Moderate concentric LVH   WRIST FRACTURE SURGERY Right ~ 1996   Family History  Family History  Problem Relation Age of Onset  Stroke Father    Hypertension Father    Coronary artery disease Brother    Diabetes Mother    Stroke Maternal Grandmother    Cancer Maternal Grandmother        Breast cancer   Hypertension Maternal Grandfather    Heart attack Maternal Grandfather    Heart attack Maternal Aunt 41   Colon cancer Neg Hx    Esophageal cancer Neg Hx    Stomach cancer Neg Hx    Rectal cancer Neg Hx    Social History  reports that she has been smoking cigarettes. She has a 7.50 pack-year smoking history. She has never used smokeless tobacco. She reports that she does not drink alcohol and does  not use drugs. Allergies  Allergies  Allergen Reactions   Doxazosin Mesylate     Increased heart rate   Rosuvastatin Other (See Comments)    myalgia   Simvastatin     myalgias   Wellbutrin [Bupropion Hcl] Other (See Comments)    Suicidal thoughts and increased depression   Zetia [Ezetimibe]     myalgias   Home medications Prior to Admission medications   Medication Sig Start Date End Date Taking? Authorizing Provider  amLODipine (NORVASC) 10 MG tablet Take 1 tablet (10 mg total) by mouth daily. 10/22/21   Drenda Freeze, MD  aspirin 81 MG tablet Take 81 mg by mouth at bedtime.    [provider]  carvedilol (COREG) 25 MG tablet TAKE 1 TABLET BY MOUTH TWICE A DAY WITH A MEAL TO REPLACE METOPROLOL 06/26/21   Lorretta Harp, MD  Cholecalciferol (VITAMIN D3) 50 MCG (2000 UT) TABS Take 2,000 Units by mouth daily.    [provider]  cloNIDine (CATAPRES) 0.2 MG tablet Take 1 tablet (0.2 mg total) by mouth 2 (two) times daily. Appointment needed 03/03/21   Lorretta Harp, MD  [employer] IRON 325 (65 Fe) MG tablet Take 325 mg by mouth 2 (two) times daily with a meal. 04/04/20   [provider]  hydrALAZINE (APRESOLINE) 50 MG tablet Take 1 tablet (50 mg total) by mouth 3 (three) times daily. 10/22/21   Drenda Freeze, MD  [facility] Calcium 500 MG TABS Take 500 mg by mouth 2 (two) times daily. 04/18/19   [provider]  sevelamer carbonate (RENVELA) 800 MG tablet Take 1,600 mg by mouth 3 (three) times daily with meals. 04/24/21   [provider]  sodium bicarbonate 650 MG tablet Take 1,950 mg by mouth 2 (two) times daily. 04/04/20   [provider]     Vitals:   10/25/21 1000 10/25/21 1002 10/25/21 1100 10/25/21 1130  BP: (!) 187/63  (!) 157/51   Pulse: 60  (!) 57 60  Resp: 11  11 11   Temp:  98.3 F (36.8 C)    TempSrc:  Oral    SpO2: 93%  93% 94%  Weight:      Height:       Exam Gen alert, no distress, looks a bit tired No  rash, cyanosis or gangrene Sclera anicteric, throat clear  No jvd or bruits Chest clear bilat to bases, no rales/ wheezing RRR no MRG Abd soft ntnd no mass or ascites +bs GU defer MS no joint effusions or deformity Ext no LE or UE edema, no wounds or ulcers Neuro is alert, Ox 3 , nf    LUA AVF+bruit   Home meds include - amlodipine 10, aspirin, carvedilol 25 bid, clonidine 0.2 bid, hydralazine 50  tid, sevelamer carb 2 ac tid, prns/ vits/ supps     Date   Creat  eGFR    2010- 2015  0.8- 1.00    2016- 2018  1.15- 1.46    2019   1.59- 2.00    2021   4.35    Feb - July 2022 4.60 >> 6.07    10/22/21  13.71     10/24/21  13.45  3 ml/min     BP's in ED >  269/99, 210/ 166   HR 60-70   RR 13-20  afebrile  RA 95%    CXR negative   BUN 99  Cr 13.4  CO2 22   K+ 3.6   Na 137 CA 7.9 alb 3.6    Hb 8.0  WBC 11K    Assessment/ Plan: CKD 5 - w/ uiremic symptoms of progressive fatigue and loss of appetite. Agrees to go ahead w/ HD initiation. Has a good looking L arm AVF created in early 2022. Will try to get 1st HD tonight (if no emergencies) then 2nd HD tomorrow. Will need to CLIP.  Uncont HTN - on 4 BP lowering meds at home, BP's uncontrolled here.  Volume - no vol overload on exam. CXR no edema, BP's quite high, will UF 2 L as tol w/ HD today / tomorrow. Home hydralazine ^'d. Will follow.  Anemia esrd - Hb 7.5- 8s here, will give darbe and get fe/tibc.  MBD ckd - taking binder, cont here. CCa in range, add on phos.  COPD    Rob Auron Tadros  MD 10/25/2021, 1:14 PM Recent Labs  Lab 10/24/21 1738 10/24/21 2239 10/24/21 2249 10/25/21 0615  HGB 8.0*  --  7.8* 7.6*  ALBUMIN 3.6 3.2*  --  3.2*  CALCIUM 7.9*  --   --  8.2*  PHOS  --  6.3*  --  6.7*  CREATININE 13.45*  --   --  14.55*  K 3.6  --  3.4* 3.7   Inpatient medications:  amLODipine  10 mg Oral Daily   aspirin EC  81 mg Oral QHS   carvedilol  25 mg Oral BID WC   [START ON 10/26/2021] Chlorhexidine Gluconate Cloth  6 each  Topical Q0600   [START ON 10/26/2021] Chlorhexidine Gluconate Cloth  6 each Topical Q0600   cloNIDine  0.2 mg Oral TID   [START ON 10/26/2021] darbepoetin (ARANESP) injection - DIALYSIS  40 mcg Intravenous Q Mon-HD   heparin injection (subcutaneous)  5,000 Units Subcutaneous Q8H   hydrALAZINE  100 mg Oral TID   insulin aspart  0-6 Units Subcutaneous Q4H   sevelamer carbonate  1,600 mg Oral TID WC   sodium bicarbonate  1,950 mg Oral BID   sodium chloride flush  3 mL Intravenous Q12H    sodium chloride

## 2021-10-24 NOTE — Subjective & Objective (Signed)
Came in report of HTN and headache Bp was 224/94 Has a fistula in left arm She still makes urine

## 2021-10-24 NOTE — Assessment & Plan Note (Signed)
restrt home meds Avoid sudden BP drop   nephrology will see and start on HD in AM Obtain troponin

## 2021-10-24 NOTE — Assessment & Plan Note (Signed)
Nephrology aware will see in AM Plant to start HD ths admit

## 2021-10-25 ENCOUNTER — Encounter (HOSPITAL_COMMUNITY): Payer: Self-pay | Admitting: Internal Medicine

## 2021-10-25 DIAGNOSIS — N186 End stage renal disease: Secondary | ICD-10-CM

## 2021-10-25 DIAGNOSIS — N185 Chronic kidney disease, stage 5: Secondary | ICD-10-CM | POA: Diagnosis not present

## 2021-10-25 DIAGNOSIS — I2510 Atherosclerotic heart disease of native coronary artery without angina pectoris: Secondary | ICD-10-CM | POA: Diagnosis not present

## 2021-10-25 DIAGNOSIS — I160 Hypertensive urgency: Secondary | ICD-10-CM | POA: Diagnosis not present

## 2021-10-25 LAB — CBC
HCT: 23.8 % — ABNORMAL LOW (ref 36.0–46.0)
Hemoglobin: 7.6 g/dL — ABNORMAL LOW (ref 12.0–15.0)
MCH: 28.9 pg (ref 26.0–34.0)
MCHC: 31.9 g/dL (ref 30.0–36.0)
MCV: 90.5 fL (ref 80.0–100.0)
Platelets: 200 10*3/uL (ref 150–400)
RBC: 2.63 MIL/uL — ABNORMAL LOW (ref 3.87–5.11)
RDW: 14.4 % (ref 11.5–15.5)
WBC: 8.5 10*3/uL (ref 4.0–10.5)
nRBC: 0 % (ref 0.0–0.2)

## 2021-10-25 LAB — COMPREHENSIVE METABOLIC PANEL
ALT: 13 U/L (ref 0–44)
AST: 13 U/L — ABNORMAL LOW (ref 15–41)
Albumin: 3.2 g/dL — ABNORMAL LOW (ref 3.5–5.0)
Alkaline Phosphatase: 28 U/L — ABNORMAL LOW (ref 38–126)
Anion gap: 14 (ref 5–15)
BUN: 107 mg/dL — ABNORMAL HIGH (ref 6–20)
CO2: 22 mmol/L (ref 22–32)
Calcium: 8.2 mg/dL — ABNORMAL LOW (ref 8.9–10.3)
Chloride: 102 mmol/L (ref 98–111)
Creatinine, Ser: 14.55 mg/dL — ABNORMAL HIGH (ref 0.44–1.00)
GFR, Estimated: 3 mL/min — ABNORMAL LOW (ref >60)
Glucose, Bld: 100 mg/dL — ABNORMAL HIGH (ref 70–99)
Potassium: 3.7 mmol/L (ref 3.5–5.1)
Sodium: 138 mmol/L (ref 135–145)
Total Bilirubin: 0.6 mg/dL (ref 0.3–1.2)
Total Protein: 6.1 g/dL — ABNORMAL LOW (ref 6.5–8.1)

## 2021-10-25 LAB — MAGNESIUM
Magnesium: 2.3 mg/dL (ref 1.7–2.4)
Magnesium: 2.3 mg/dL (ref 1.7–2.4)

## 2021-10-25 LAB — SODIUM, URINE, RANDOM: Sodium, Ur: 82 mmol/L

## 2021-10-25 LAB — HEPATIC FUNCTION PANEL
ALT: 14 U/L (ref 0–44)
AST: 12 U/L — ABNORMAL LOW (ref 15–41)
Albumin: 3.2 g/dL — ABNORMAL LOW (ref 3.5–5.0)
Alkaline Phosphatase: 31 U/L — ABNORMAL LOW (ref 38–126)
Bilirubin, Direct: 0.1 mg/dL (ref 0.0–0.2)
Indirect Bilirubin: 0.4 mg/dL (ref 0.3–0.9)
Total Bilirubin: 0.5 mg/dL (ref 0.3–1.2)
Total Protein: 5.6 g/dL — ABNORMAL LOW (ref 6.5–8.1)

## 2021-10-25 LAB — PHOSPHORUS
Phosphorus: 6.3 mg/dL — ABNORMAL HIGH (ref 2.5–4.6)
Phosphorus: 6.7 mg/dL — ABNORMAL HIGH (ref 2.5–4.6)

## 2021-10-25 LAB — CBG MONITORING, ED
Glucose-Capillary: 92 mg/dL (ref 70–99)
Glucose-Capillary: 105 mg/dL — ABNORMAL HIGH (ref 70–99)
Glucose-Capillary: 104 mg/dL — ABNORMAL HIGH (ref 70–99)

## 2021-10-25 LAB — HEPATITIS B SURFACE ANTIBODY,QUALITATIVE: Hep B S Ab: NONREACTIVE

## 2021-10-25 LAB — HEPATITIS B SURFACE ANTIGEN: Hepatitis B Surface Ag: NONREACTIVE

## 2021-10-25 LAB — IRON AND TIBC
Iron: 78 ug/dL (ref 28–170)
Saturation Ratios: 29 % (ref 10.4–31.8)
TIBC: 267 ug/dL (ref 250–450)
UIBC: 189 ug/dL

## 2021-10-25 LAB — CREATININE, URINE, RANDOM: Creatinine, Urine: 54 mg/dL

## 2021-10-25 LAB — HEPATITIS C ANTIBODY: HCV Ab: NONREACTIVE

## 2021-10-25 LAB — HEPATITIS B CORE ANTIBODY, TOTAL: Hep B Core Total Ab: NONREACTIVE

## 2021-10-25 LAB — CK: Total CK: 110 U/L (ref 38–234)

## 2021-10-25 LAB — PREALBUMIN: Prealbumin: 20 mg/dL (ref 18–38)

## 2021-10-25 LAB — HIV ANTIBODY (ROUTINE TESTING W REFLEX): HIV Screen 4th Generation wRfx: NONREACTIVE

## 2021-10-25 LAB — TROPONIN I (HIGH SENSITIVITY): Troponin I (High Sensitivity): 19 ng/L — ABNORMAL HIGH (ref <18)

## 2021-10-25 MED ORDER — HEPARIN SODIUM (PORCINE) 1000 UNIT/ML IJ SOLN
INTRAMUSCULAR | Status: AC
  Administered 2021-10-26: 2000 [IU]
  Filled 2021-10-25: qty 4

## 2021-10-25 MED ORDER — DARBEPOETIN ALFA 40 MCG/0.4ML IJ SOSY: 40.0000 ug | PREFILLED_SYRINGE | INTRAMUSCULAR | Status: DC

## 2021-10-25 MED ORDER — CHLORHEXIDINE GLUCONATE CLOTH 2 % EX PADS
6.0000 | MEDICATED_PAD | Freq: Every day | CUTANEOUS | Status: DC
  Administered 2021-10-28 – 2021-10-29 (×2): 6 via TOPICAL

## 2021-10-25 MED ORDER — CHLORHEXIDINE GLUCONATE CLOTH 2 % EX PADS: 6.0000 | MEDICATED_PAD | Freq: Every day | CUTANEOUS | Status: DC

## 2021-10-25 MED ORDER — HEPARIN SODIUM (PORCINE) 5000 UNIT/ML IJ SOLN
5000.0000 [IU] | Freq: Three times a day (TID) | INTRAMUSCULAR | Status: DC
  Administered 2021-10-25 – 2021-10-30 (×11): 5000 [IU] via SUBCUTANEOUS
  Filled 2021-10-25 (×11): qty 1

## 2021-10-25 MED ORDER — LABETALOL HCL 5 MG/ML IV SOLN
10.0000 mg | INTRAVENOUS | Status: DC | PRN
  Administered 2021-10-26 – 2021-10-27 (×2): 10 mg via INTRAVENOUS
  Filled 2021-10-25 (×2): qty 4

## 2021-10-25 MED ORDER — HYDRALAZINE HCL 20 MG/ML IJ SOLN
10.0000 mg | Freq: Four times a day (QID) | INTRAMUSCULAR | Status: DC | PRN
  Administered 2021-10-25: 10 mg via INTRAVENOUS
  Filled 2021-10-25 (×2): qty 1

## 2021-10-25 MED ORDER — LABETALOL HCL 5 MG/ML IV SOLN
10.0000 mg | Freq: Once | INTRAVENOUS | Status: AC
  Administered 2021-10-25: 10 mg via INTRAVENOUS
  Filled 2021-10-25: qty 4

## 2021-10-25 MED ORDER — CLONIDINE HCL 0.2 MG PO TABS
0.2000 mg | ORAL_TABLET | Freq: Three times a day (TID) | ORAL | Status: DC
  Administered 2021-10-25 – 2021-10-30 (×15): 0.2 mg via ORAL
  Filled 2021-10-25 (×15): qty 1

## 2021-10-25 MED ORDER — HYDRALAZINE HCL 50 MG PO TABS
100.0000 mg | ORAL_TABLET | Freq: Three times a day (TID) | ORAL | Status: DC
  Administered 2021-10-25 – 2021-10-30 (×15): 100 mg via ORAL
  Filled 2021-10-25: qty 2
  Filled 2021-10-25: qty 4
  Filled 2021-10-25 (×9): qty 2
  Filled 2021-10-25: qty 4
  Filled 2021-10-25 (×3): qty 2

## 2021-10-25 NOTE — Progress Notes (Signed)
PROGRESS NOTE        PATIENT DETAILS Name: [name] Age: 61 y.o. Sex: female Date of Birth: 17-Jun-1960 Admit Date: 10/24/2021 Admitting Physician Toy Baker, MD EVO:[id number], No Pcp Per  Brief Summary: Patient is a 61 y.o.  female CKD stage V, HTN, normocytic anemia, nonobstructive CAD, HFpEF, -who presented with worsening headaches-found to have uncontrolled hypertension in the setting of probable progression to ESRD and subsequently admitted to the hospital service  Significant events: 10/14>> admitted TRH-hypertensive urgency-progression to ESRD.  Significant studies: 10/14>> CT head: No acute intracranial process 10/14>> CXR: No PNA/edema.  Significant microbiology data: None  Procedures: None  Consults: Renal  Subjective: Lying comfortably in bed-denies any chest pain or shortness of breath.  Headache is only minimal.  Objective: Vitals: Blood pressure (!) 216/80, pulse 62, temperature 98 F (36.7 C), temperature source Oral, resp. rate 17, height '5\' 4"'$  (1.626 m), weight 88.9 kg, last menstrual period 12/14/2013, SpO2 95 %.   Exam: Gen Exam:Alert awake-not in any distress HEENT:atraumatic, normocephalic Chest: B/L clear to auscultation anteriorly CVS:S1S2 regular Abdomen:soft non tender, non distended Extremities:no edema Neurology: Non focal Skin: no rash  Pertinent Labs/Radiology:    Latest Ref Rng & Units 10/25/2021    6:15 AM 10/24/2021   10:49 PM 10/24/2021    5:38 PM  CBC  WBC 4.0 - 10.5 K/uL 8.5   11.0   Hemoglobin 12.0 - 15.0 g/dL 7.6  7.8  8.0   Hematocrit 36.0 - 46.0 % 23.8  23.0  25.4   Platelets 150 - 400 K/uL 200   214     Lab Results  Component Value Date   NA 138 10/25/2021   K 3.7 10/25/2021   CL 102 10/25/2021   CO2 22 10/25/2021      Assessment/Plan: Hypertensive urgency BP remains elevated in the 182X-HBZ 169C systolic range.  Thankfully headache much better. Increase clonidine to  3 times daily dosing, increase hydralazine to 100 mg 3 times daily, continue Coreg/amlodipine. As needed labetalol/hydralazine for significantly elevated SBP more than 180 Hopeful that once hemodialysis started-blood pressure will be better controlled.  CKD 4 with progression to ESRD Nephrology planning initiation of HD this hospitalization. Defer further to nephrology.  Normocytic anemia In the setting of ESRD-Per patient-she was on IV iron infusion in the outpatient setting Defer iron/EPO to nephrology service  Chronic HFpEF Volume status stable Will be managed predominantly by HD from now on.  Nonobstructive CAD by [facility] 2010, 2013, 2014 Currently no anginal symptoms Continue ASA  HLD Per prior notes-intolerant to statin therapy.  Obesity: Estimated body mass index is 33.64 kg/m as calculated from the following:   Height as of this encounter: '5\' 4"'$  (1.626 m).   Weight as of this encounter: 88.9 kg.   Code status:   Code Status: Full Code   DVT Prophylaxis: heparin injection 5,000 Units Start: 10/25/21 0930 SCDs Start: 10/24/21 2237    Family Communication: None at bedside   Disposition Plan: Status is: Inpatient Remains inpatient appropriate because: Uncontrolled hypertension-progression to ESRD-awaiting initiation of HD.  Not stable for discharge.   Planned Discharge Destination:Home   Diet: Diet Order             Diet renal/carb modified with fluid restriction Diet-HS Snack? Nothing; Fluid restriction: 1200 mL Fluid; Room service appropriate? Yes; Fluid consistency: Thin  Diet effective  now                     Antimicrobial agents: Anti-infectives (From admission, onward)    None        MEDICATIONS: Scheduled Meds:  amLODipine  10 mg Oral Daily   aspirin EC  81 mg Oral QHS   carvedilol  25 mg Oral BID WC   cloNIDine  0.2 mg Oral TID   heparin injection (subcutaneous)  5,000 Units Subcutaneous Q8H   hydrALAZINE  100 mg Oral TID   insulin  aspart  0-6 Units Subcutaneous Q4H   sevelamer carbonate  1,600 mg Oral TID WC   sodium bicarbonate  1,950 mg Oral BID   sodium chloride flush  3 mL Intravenous Q12H   Continuous Infusions:  sodium chloride     PRN Meds:.sodium chloride, acetaminophen **OR** acetaminophen, hydrALAZINE, HYDROcodone-acetaminophen, labetalol, sodium chloride flush, traMADol   I have personally reviewed following labs and imaging studies  LABORATORY DATA: CBC: Recent Labs  Lab 10/22/21 1111 10/22/21 1432 10/24/21 1738 10/24/21 2249 10/25/21 0615  WBC  --  8.8 11.0*  --  8.5  NEUTROABS  --  6.1 8.3*  --   --   HGB 8.3* 7.4* 8.0* 7.8* 7.6*  HCT  --  23.2* 25.4* 23.0* 23.8*  MCV  --  91.7 89.8  --  90.5  PLT  --  186 214  --  831    Basic Metabolic Panel: Recent Labs  Lab 10/22/21 1432 10/24/21 1738 10/24/21 2239 10/24/21 2249 10/25/21 0615  NA 138 137  --  140 138  K 3.9 3.6  --  3.4* 3.7  CL 102 101  --   --  102  CO2 25 22  --   --  22  GLUCOSE 102* 114*  --   --  100*  BUN 107* 99*  --   --  107*  CREATININE 13.71* 13.45*  --   --  14.55*  CALCIUM 8.3* 7.9*  --   --  8.2*  MG  --   --  2.3  --  2.3  PHOS  --   --  6.3*  --  6.7*    GFR: Estimated Creatinine Clearance: 4.5 mL/min (A) (by C-G formula based on SCr of 14.55 mg/dL (H)).  Liver Function Tests: Recent Labs  Lab 10/22/21 1432 10/24/21 1738 10/24/21 2239 10/25/21 0615  AST 14* 15 12* 13*  ALT '18 16 14 13  '$ ALKPHOS 37* 35* 31* 28*  BILITOT 0.7 0.4 0.5 0.6  PROT 6.0* 7.2 5.6* 6.1*  ALBUMIN 3.1* 3.6 3.2* 3.2*   No results for input(s): "LIPASE", "AMYLASE" in the last 168 hours. No results for input(s): "AMMONIA" in the last 168 hours.  Coagulation Profile: No results for input(s): "INR", "PROTIME" in the last 168 hours.  Cardiac Enzymes: Recent Labs  Lab 10/24/21 2239  CKTOTAL 110    BNP (last 3 results) No results for input(s): "PROBNP" in the last 8760 hours.  Lipid Profile: No results for  input(s): "CHOL", "HDL", "LDLCALC", "TRIG", "CHOLHDL", "LDLDIRECT" in the last 72 hours.  Thyroid Function Tests: Recent Labs    10/24/21 2239  TSH 1.844    Anemia Panel: No results for input(s): "VITAMINB12", "FOLATE", "FERRITIN", "TIBC", "IRON", "RETICCTPCT" in the last 72 hours.  Urine analysis:    Component Value Date/Time   COLORURINE STRAW (A) 10/24/2021 1700   APPEARANCEUR CLEAR 10/24/2021 1700   APPEARANCEUR Clear 10/24/2017 1710   LABSPEC 1.020 10/24/2021  1700   PHURINE 7.5 10/24/2021 1700   GLUCOSEU 100 (A) 10/24/2021 1700   HGBUR TRACE (A) 10/24/2021 1700   BILIRUBINUR NEGATIVE 10/24/2021 1700   BILIRUBINUR negative 08/10/2020 1452   BILIRUBINUR Negative 10/24/2017 1710   KETONESUR NEGATIVE 10/24/2021 1700   PROTEINUR >=300 (A) 10/24/2021 1700   UROBILINOGEN 0.2 08/10/2020 1452   NITRITE NEGATIVE 10/24/2021 1700   LEUKOCYTESUR NEGATIVE 10/24/2021 1700    Sepsis Labs: Lactic Acid, Venous No results found for: "LATICACIDVEN"  MICROBIOLOGY: No results found for this or any previous visit (from the past 240 hour(s)).  RADIOLOGY STUDIES/RESULTS: DG Chest 2 View  Result Date: 10/24/2021 CLINICAL DATA:  Hypertension EXAM: CHEST - 2 VIEW COMPARISON:  Chest x-ray 10/22/2021 FINDINGS: The heart is enlarged, unchanged. The lungs are clear. There is no pleural effusion or pneumothorax. No acute fractures are seen. There is a stable calcified granuloma in the right upper lobe. IMPRESSION: 1. No active cardiopulmonary disease. 2. Stable cardiomegaly. Electronically Signed   By: Ronney Asters M.D.   On: 10/24/2021 17:43   CT Head Wo Contrast  Result Date: 10/24/2021 CLINICAL DATA:  Headache, hypertension EXAM: CT HEAD WITHOUT CONTRAST TECHNIQUE: Contiguous axial images were obtained from the base of the skull through the vertex without intravenous contrast. RADIATION DOSE REDUCTION: This exam was performed according to the departmental dose-optimization program which  includes automated exposure control, adjustment of the mA and/or kV according to patient size and/or use of iterative reconstruction technique. COMPARISON:  None Available. FINDINGS: Brain: No evidence of acute infarction, hemorrhage, mass, mass effect, or midline shift. No hydrocephalus or extra-axial fluid collection. Vascular: No hyperdense vessel. Skull: Normal. Negative for fracture or focal lesion. Sinuses/Orbits: No acute finding. Other: The mastoid air cells are well aerated. IMPRESSION: No acute intracranial process. Electronically Signed   By: Merilyn Baba M.D.   On: 10/24/2021 17:30     LOS: 1 day   Oren Binet, MD  [facility]    To contact the attending provider between 7A-7P or the covering provider during after hours 7P-7A, please log into the web site [email or web address] and access using universal [place] password for that web site. If you do not have the password, please call the hospital operator.  10/25/2021, 9:27 AM

## 2021-10-25 NOTE — ED Notes (Addendum)
Pts BP has increased to 215/77. Dr. Sloan Leiter informed via epic secure chat.

## 2021-10-25 NOTE — ED Notes (Signed)
Patient awake and alert this morning, no s/s of distress, took her morning medications without difficulty, ambulated to the restroom unassisted, and ate breakfast, no s/s of distress, will continue to monitor.

## 2021-10-25 NOTE — ED Notes (Signed)
Pt eating lunch, other than some generalized pain she denies new concerns at this time, no s/s of distress, will continue to monitor.

## 2021-10-25 NOTE — ED Notes (Addendum)
BP 206/75, Dr. Roel Cluck informed via epic secure chat.

## 2021-10-25 NOTE — Progress Notes (Signed)
Patient going to HD tonight. Patient notified and consent form signed. Report given to HD nurse.

## 2021-10-26 DIAGNOSIS — I5032 Chronic diastolic (congestive) heart failure: Secondary | ICD-10-CM

## 2021-10-26 DIAGNOSIS — N184 Chronic kidney disease, stage 4 (severe): Secondary | ICD-10-CM

## 2021-10-26 DIAGNOSIS — D508 Other iron deficiency anemias: Secondary | ICD-10-CM

## 2021-10-26 DIAGNOSIS — I160 Hypertensive urgency: Secondary | ICD-10-CM | POA: Diagnosis not present

## 2021-10-26 DIAGNOSIS — I2510 Atherosclerotic heart disease of native coronary artery without angina pectoris: Secondary | ICD-10-CM | POA: Diagnosis not present

## 2021-10-26 DIAGNOSIS — N186 End stage renal disease: Secondary | ICD-10-CM | POA: Diagnosis not present

## 2021-10-26 DIAGNOSIS — E785 Hyperlipidemia, unspecified: Secondary | ICD-10-CM

## 2021-10-26 DIAGNOSIS — N185 Chronic kidney disease, stage 5: Secondary | ICD-10-CM | POA: Diagnosis not present

## 2021-10-26 LAB — RENAL FUNCTION PANEL
Albumin: 3 g/dL — ABNORMAL LOW (ref 3.5–5.0)
Anion gap: 13 (ref 5–15)
BUN: 72 mg/dL — ABNORMAL HIGH (ref 6–20)
CO2: 27 mmol/L (ref 22–32)
Calcium: 7.9 mg/dL — ABNORMAL LOW (ref 8.9–10.3)
Chloride: 98 mmol/L (ref 98–111)
Creatinine, Ser: 10.9 mg/dL — ABNORMAL HIGH (ref 0.44–1.00)
GFR, Estimated: 4 mL/min — ABNORMAL LOW (ref >60)
Glucose, Bld: 133 mg/dL — ABNORMAL HIGH (ref 70–99)
Phosphorus: 4.8 mg/dL — ABNORMAL HIGH (ref 2.5–4.6)
Potassium: 3.5 mmol/L (ref 3.5–5.1)
Sodium: 138 mmol/L (ref 135–145)

## 2021-10-26 LAB — CBC
HCT: 23.7 % — ABNORMAL LOW (ref 36.0–46.0)
Hemoglobin: 7.3 g/dL — ABNORMAL LOW (ref 12.0–15.0)
MCH: 28.4 pg (ref 26.0–34.0)
MCHC: 30.8 g/dL (ref 30.0–36.0)
MCV: 92.2 fL (ref 80.0–100.0)
Platelets: 195 10*3/uL (ref 150–400)
RBC: 2.57 MIL/uL — ABNORMAL LOW (ref 3.87–5.11)
RDW: 14.6 % (ref 11.5–15.5)
WBC: 6.4 10*3/uL (ref 4.0–10.5)
nRBC: 0 % (ref 0.0–0.2)

## 2021-10-26 MED ORDER — DARBEPOETIN ALFA 40 MCG/0.4ML IJ SOSY: 40.0000 ug | PREFILLED_SYRINGE | INTRAMUSCULAR | Status: DC

## 2021-10-26 MED ORDER — RENA-VITE PO TABS
1.0000 | ORAL_TABLET | Freq: Every day | ORAL | Status: DC
  Administered 2021-10-26 – 2021-10-29 (×4): 1 via ORAL
  Filled 2021-10-26 (×4): qty 1

## 2021-10-26 MED ORDER — HEPARIN SODIUM (PORCINE) 1000 UNIT/ML IJ SOLN
INTRAMUSCULAR | Status: AC
  Administered 2021-10-26: 2000 [IU] via INTRAVENOUS_CENTRAL
  Filled 2021-10-26: qty 2

## 2021-10-26 NOTE — Progress Notes (Signed)
Initial Nutrition Assessment  DOCUMENTATION CODES:   Not applicable  INTERVENTION:  Liberalize diet from a renal/carb modified diet to a 2g sodium diet to provide widest variety of menu options to enhance nutritional adequacy Double protein with all meals Renal MVI with minerals daily  NUTRITION DIAGNOSIS:   Increased nutrient needs related to chronic illness (ESRD) as evidenced by estimated needs.  GOAL:   Patient will meet greater than or equal to 90% of their needs  MONITOR:   PO intake, Diet advancement, Labs  REASON FOR ASSESSMENT:   Consult Other (Comment) (nutrition goals)  ASSESSMENT:   Pt admitted with uncontrolled hypertension. PMH significant for CKD stage V, mature AVF, not on HD yet, CAD, COPD, T2DM, HTN  Per Nephrology, CLIP for outpatient HD placement. Getting set up for home PD. Had first HD over night. Had another session this afternoon.  Net UF 2L (HD#1) Net UF 1.6L (HD#2) Post HD weight 82.7 kg  Pt sitting up in chair at time of visit. She reports that she has had a poor appetite for quite a while. She states that when she had her AVF placed, she had a lot of weight loss. She typically eats 2 meals per day which include hard boiled eggs, grits or oatmeal for breakfast and a stew for lunch. She often forgets to eat when she is working. Pt also reports snacking at times on fruits such as pineapple or an apple.   Meal completions: 10/16: 100% breakfast, 100% lunch (after HD), 75% dinner  She reports that recently her weight has been stable with no significant changes. Unfortunately there is limited documentation of weight history within the last year. Her weigh tis down 6.2 kg since 10/4 which is likely r/t volume status d/t ESRD and HF.   Pt denies having a diagnosis of diabetes and does not take insulin at home.   Medications: renvela, sodium bicarbonate   Labs: BUN 107, Cr 14.55, phos 4.8, 6.7 (H), alkaline phosphatase 28, AST 13, GFR 3, HgbA1c 5.5%,  CBG's 92-108 x24 hours  UOP: 347m x12 hours +2047mx12 hours  NUTRITION - FOCUSED PHYSICAL EXAM:  Flowsheet Row Most Recent Value  Orbital Region No depletion  Upper Arm Region No depletion  Thoracic and Lumbar Region No depletion  Buccal Region No depletion  Temple Region No depletion  Clavicle Bone Region No depletion  Clavicle and Acromion Bone Region No depletion  Scapular Bone Region No depletion  Dorsal Hand No depletion  Patellar Region No depletion  Anterior Thigh Region No depletion  Posterior Calf Region No depletion  Edema (RD Assessment) None  Hair Reviewed  Eyes Reviewed  Mouth Reviewed  Skin Reviewed  Nails Reviewed      Diet Order:   Diet Order             Diet renal/carb modified with fluid restriction Diet-HS Snack? Nothing; Fluid restriction: 1200 mL Fluid; Room service appropriate? Yes; Fluid consistency: Thin  Diet effective now                   EDUCATION NEEDS:   Education needs have been addressed  Skin:  Skin Assessment: Reviewed RN Assessment  Last BM:  10/14  Height:   Ht Readings from Last 1 Encounters:  10/25/21 '5\' 4"'$  (1.626 m)    Weight:   Wt Readings from Last 1 Encounters:  10/26/21 82.7 kg   BMI:  Body mass index is 31.3 kg/m.  Estimated Nutritional Needs:   Kcal:  1500-1700  Protein:  75-90g  Fluid:  1L + UOP  Clayborne Dana, RDN, LDN Clinical Nutrition

## 2021-10-26 NOTE — Progress Notes (Signed)
PT Screen Note  Patient Details Name: [name] MRN: [id number] DOB: 04-21-1960   Cancelled Treatment:    Reason Eval/Treat Not Completed: PT screened, no needs identified, will sign off  Orders received and chart reviewed.  Noted in chart pt has been mobilizing on her own.  Spoke with pt who reports no issues with mobility - normal strength and balance.  Denies therapy needs.  PT agrees and will sign off. [facility] [phone]  [name] 10/26/2021, 4:54 PM

## 2021-10-26 NOTE — Progress Notes (Signed)
Patient is back, BP is elevated (yellow MEWS). Medication given.

## 2021-10-26 NOTE — Plan of Care (Signed)
  Problem: Coping: Goal: Ability to adjust to condition or change in health will improve Outcome: Progressing   Problem: Fluid Volume: Goal: Ability to maintain a balanced intake and output will improve Outcome: Progressing   Problem: Health Behavior/Discharge Planning: Goal: Ability to identify and utilize available resources and services will improve Outcome: Progressing   

## 2021-10-26 NOTE — Progress Notes (Signed)
PT Cancellation Note  Patient Details Name: [name] MRN: [id number] DOB: [birth year]   Cancelled Treatment:    Reason Eval/Treat Not Completed: Patient at procedure or test/unavailable  Pt just left for HD at arrival.  Will f/u if able later today, but more likely tomorrow.  Abran Richard, PT Acute Rehab [facility] [phone]  Karlton Lemon 10/26/2021, 12:39 PM

## 2021-10-26 NOTE — Progress Notes (Signed)
[facility] Progress Note    Assessment/ Plan:   CKD 5, now ESRD - w/ uremic symptoms of progressive fatigue and loss of appetite.L arm AVF created in early 2022.  Slow start protocol. HD#1 10/16. #2 planned for tomorrow CLIP for outpatient HD placement. Of note, she is in the process of getting set up for home PD. Can benefit from transitional care unit Uncont HTN/volume - on 4 BP lowering meds at home, BP's uncontrolled here. Euvolemic on exam. Will UF as tolerated Anemia esrd - Hb 7.5- 8s here, will give darbe and get fe/tibc.  MBD ckd - taking binder, cont here. CCa in range, add on phos.  COPD-per primary service  Gean Quint, MD [facility]   Subjective:   No acute events. Patient reports that she tolerated her first HD overnight. Slept through session. No complaints currently.   Objective:   BP (!) 194/71   Pulse 67   Temp 98.2 F (36.8 C) (Oral)   Resp 18   Ht '5\' 4"'$  (1.626 m)   Wt 83.1 kg Comment: scale c  LMP 12/14/2013   SpO2 98%   BMI 31.45 kg/m   Intake/Output Summary (Last 24 hours) at 10/26/2021 1053 Last data filed at 10/26/2021 1002 Gross per 24 hour  Intake 600 ml  Output 2500 ml  Net -1900 ml   Weight change: -4.305 kg  Physical Exam: Gen: NAD CVS: S1S2, RRR Resp: cta bl, normal WOB Abd: soft, nt/nd Ext: no sig edema Neuro: awake, alert Dialysis access: LUE AVF +b/t  Imaging: DG Chest 2 View  Result Date: 10/24/2021 CLINICAL DATA:  Hypertension EXAM: CHEST - 2 VIEW COMPARISON:  Chest x-ray 10/22/2021 FINDINGS: The heart is enlarged, unchanged. The lungs are clear. There is no pleural effusion or pneumothorax. No acute fractures are seen. There is a stable calcified granuloma in the right upper lobe. IMPRESSION: 1. No active cardiopulmonary disease. 2. Stable cardiomegaly. Electronically Signed   By: Ronney Asters M.D.   On: 10/24/2021 17:43   CT Head Wo Contrast  Result Date: 10/24/2021 CLINICAL DATA:   Headache, hypertension EXAM: CT HEAD WITHOUT CONTRAST TECHNIQUE: Contiguous axial images were obtained from the base of the skull through the vertex without intravenous contrast. RADIATION DOSE REDUCTION: This exam was performed according to the departmental dose-optimization program which includes automated exposure control, adjustment of the mA and/or kV according to patient size and/or use of iterative reconstruction technique. COMPARISON:  None Available. FINDINGS: Brain: No evidence of acute infarction, hemorrhage, mass, mass effect, or midline shift. No hydrocephalus or extra-axial fluid collection. Vascular: No hyperdense vessel. Skull: Normal. Negative for fracture or focal lesion. Sinuses/Orbits: No acute finding. Other: The mastoid air cells are well aerated. IMPRESSION: No acute intracranial process. Electronically Signed   By: Merilyn Baba M.D.   On: 10/24/2021 17:30    Labs: BMET Recent Labs  Lab 10/22/21 1432 10/24/21 1738 10/24/21 2239 10/24/21 2249 10/25/21 0615  NA 138 137  --  140 138  K 3.9 3.6  --  3.4* 3.7  CL 102 101  --   --  102  CO2 25 22  --   --  22  GLUCOSE 102* 114*  --   --  100*  BUN 107* 99*  --   --  107*  CREATININE 13.71* 13.45*  --   --  14.55*  CALCIUM 8.3* 7.9*  --   --  8.2*  PHOS  --   --  6.3*  --  6.7*   CBC Recent Labs  Lab 10/22/21 1432 10/24/21 1738 10/24/21 2249 10/25/21 0615  WBC 8.8 11.0*  --  8.5  NEUTROABS 6.1 8.3*  --   --   HGB 7.4* 8.0* 7.8* 7.6*  HCT 23.2* 25.4* 23.0* 23.8*  MCV 91.7 89.8  --  90.5  PLT 186 214  --  200    Medications:     amLODipine  10 mg Oral Daily   aspirin EC  81 mg Oral QHS   carvedilol  25 mg Oral BID WC   Chlorhexidine Gluconate Cloth  6 each Topical Q0600   Chlorhexidine Gluconate Cloth  6 each Topical Q0600   cloNIDine  0.2 mg Oral TID   heparin injection (subcutaneous)  5,000 Units Subcutaneous Q8H   hydrALAZINE  100 mg Oral TID   sevelamer carbonate  1,600 mg Oral TID WC   sodium  bicarbonate  1,950 mg Oral BID   sodium chloride flush  3 mL Intravenous Q12H      Gean Quint, MD [facility] 10/26/2021, 10:53 AM

## 2021-10-26 NOTE — Progress Notes (Signed)
   10/26/21 0214  Vitals  Temp (!) 97.5 F (36.4 C)  Temp Source Oral  BP (!) 210/75  MAP (mmHg) 116  Pulse Rate (!) 58  ECG Heart Rate (!) 58  Resp 15  Level of Consciousness  Level of Consciousness Alert  MEWS COLOR  MEWS Score Color Yellow  Oxygen Therapy  SpO2 98 %  O2 Device Room Air  PCA/Epidural/Spinal Assessment  Respiratory Pattern Regular;Unlabored  MEWS Score  MEWS Temp 0  MEWS Systolic 2  MEWS Pulse 0  MEWS RR 0  MEWS LOC 0  MEWS Score 2   Medication given

## 2021-10-26 NOTE — Progress Notes (Signed)
OT Cancellation Note  Patient Details Name: [name] MRN: [id number] DOB: April 06, 1960   Cancelled Treatment:    Reason Eval/Treat Not Completed: Medical issues which prohibited therapy (Pt hypertensive with BP outside parameters. Will follow up for OT eval as appropriate.)  Lynnda Child, OTD, OTR/L Acute Rehab [phone] [place] 10/26/2021, 8:38 AM

## 2021-10-26 NOTE — Progress Notes (Signed)
Received patient in bed to unit.  Alert and oriented.  Informed consent signed and in chart.   Treatment initiated: 2255 Treatment completed: 0156  Patient tolerated well.  Transported back to the room  Alert, without acute distress.  Hand-off given to patient's nurse.   Access used: FISTULA Access issues: NONE  Total UF removed: 2000 ML Medication(s) given: HEPARIN BOLUS 2000 UNITS Post HD VS: 97.9 215/72 59 14 98 Advance 2L Post HD weight: 84.8 KG   [facility]

## 2021-10-26 NOTE — Progress Notes (Signed)
PROGRESS NOTE        PATIENT DETAILS Name: [name] Age: 61 y.o. Sex: female Date of Birth: October 07, 1960 Admit Date: 10/24/2021 Admitting Physician Toy Baker, MD [id number], No Pcp Per  Brief Summary: Patient is a 61 y.o.  female CKD stage V, HTN, normocytic anemia, nonobstructive CAD, HFpEF, -who presented with worsening headaches-found to have uncontrolled hypertension in the setting of probable progression to ESRD and subsequently admitted to the hospital service  Significant events: 10/14>> admitted TRH-hypertensive urgency-progression to ESRD.  Significant studies: 10/14>> CT head: No acute intracranial process 10/14>> CXR: No PNA/edema.  Significant microbiology data: None  Procedures: None  Consults: Renal  Subjective: Headache resolved No chest pain/SOB Had first dialysis last night which she says she tolerated pretty well.  Objective: Vitals: Blood pressure (!) 194/71, pulse 67, temperature 98.2 F (36.8 C), temperature source Oral, resp. rate 18, height '5\' 4"'$  (1.626 m), weight 83.1 kg, last menstrual period 12/14/2013, SpO2 98 %.   Exam: Gen Exam:Alert awake-not in any distress HEENT:atraumatic, normocephalic Chest: B/L clear to auscultation anteriorly CVS:S1S2 regular Abdomen:soft non tender, non distended Extremities:no edema Neurology: Non focal Skin: no rash   Pertinent Labs/Radiology:    Latest Ref Rng & Units 10/25/2021    6:15 AM 10/24/2021   10:49 PM 10/24/2021    5:38 PM  CBC  WBC 4.0 - 10.5 K/uL 8.5   11.0   Hemoglobin 12.0 - 15.0 g/dL 7.6  7.8  8.0   Hematocrit 36.0 - 46.0 % 23.8  23.0  25.4   Platelets 150 - 400 K/uL 200   214     Lab Results  Component Value Date   NA 138 10/25/2021   K 3.7 10/25/2021   CL 102 10/25/2021   CO2 22 10/25/2021      Assessment/Plan: Hypertensive urgency BP still remains elevated but a bit better compared to admission Asymptomatic-headaches have  resolved For second HD today Continue clonidine/amlodipine/Coreg/hydralazine (regimen just adjusted yesterday) Reassess in the next 24 hours for further changes.  Hope that as she gets more HD-BP will improve.  CKD 4 with progression to ESRD Nephrology following Has been started on HD-first HD on 10/15-seems to have tolerated well without any major issues.  Normocytic anemia In the setting of ESRD-Per patient-she was on IV iron infusion in the outpatient setting Defer iron/EPO to nephrology service  Chronic HFpEF Volume status stable-volume removal with HD  Nonobstructive CAD by [facility] 2010, 2013, 2014 Currently no anginal symptoms Continue ASA  HLD Per prior notes-intolerant to statin therapy.  Obesity: Estimated body mass index is 31.45 kg/m as calculated from the following:   Height as of this encounter: '5\' 4"'$  (1.626 m).   Weight as of this encounter: 83.1 kg.   Code status:   Code Status: Full Code   DVT Prophylaxis: heparin injection 5,000 Units Start: 10/25/21 0930 SCDs Start: 10/24/21 2237    Family Communication: None at bedside   Disposition Plan: Status is: Inpatient Remains inpatient appropriate because: Uncontrolled hypertension-progression to ESRD-awaiting initiation of HD.  Not stable for discharge.   Planned Discharge Destination:Home   Diet: Diet Order             Diet renal/carb modified with fluid restriction Diet-HS Snack? Nothing; Fluid restriction: 1200 mL Fluid; Room service appropriate? Yes; Fluid consistency: Thin  Diet effective now  Antimicrobial agents: Anti-infectives (From admission, onward)    None        MEDICATIONS: Scheduled Meds:  amLODipine  10 mg Oral Daily   aspirin EC  81 mg Oral QHS   carvedilol  25 mg Oral BID WC   Chlorhexidine Gluconate Cloth  6 each Topical Q0600   Chlorhexidine Gluconate Cloth  6 each Topical Q0600   cloNIDine  0.2 mg Oral TID   heparin injection (subcutaneous)   5,000 Units Subcutaneous Q8H   hydrALAZINE  100 mg Oral TID   sevelamer carbonate  1,600 mg Oral TID WC   sodium bicarbonate  1,950 mg Oral BID   sodium chloride flush  3 mL Intravenous Q12H   Continuous Infusions:  sodium chloride     PRN Meds:.sodium chloride, acetaminophen **OR** acetaminophen, hydrALAZINE, HYDROcodone-acetaminophen, labetalol, sodium chloride flush, traMADol   I have personally reviewed following labs and imaging studies  LABORATORY DATA: CBC: Recent Labs  Lab 10/22/21 1111 10/22/21 1432 10/24/21 1738 10/24/21 2249 10/25/21 0615  WBC  --  8.8 11.0*  --  8.5  NEUTROABS  --  6.1 8.3*  --   --   HGB 8.3* 7.4* 8.0* 7.8* 7.6*  HCT  --  23.2* 25.4* 23.0* 23.8*  MCV  --  91.7 89.8  --  90.5  PLT  --  186 214  --  200     Basic Metabolic Panel: Recent Labs  Lab 10/22/21 1432 10/24/21 1738 10/24/21 2239 10/24/21 2249 10/25/21 0615  NA 138 137  --  140 138  K 3.9 3.6  --  3.4* 3.7  CL 102 101  --   --  102  CO2 25 22  --   --  22  GLUCOSE 102* 114*  --   --  100*  BUN 107* 99*  --   --  107*  CREATININE 13.71* 13.45*  --   --  14.55*  CALCIUM 8.3* 7.9*  --   --  8.2*  MG  --   --  2.3  --  2.3  PHOS  --   --  6.3*  --  6.7*     GFR: Estimated Creatinine Clearance: 4.3 mL/min (A) (by C-G formula based on SCr of 14.55 mg/dL (H)).  Liver Function Tests: Recent Labs  Lab 10/22/21 1432 10/24/21 1738 10/24/21 2239 10/25/21 0615  AST 14* 15 12* 13*  ALT '18 16 14 13  '$ ALKPHOS 37* 35* 31* 28*  BILITOT 0.7 0.4 0.5 0.6  PROT 6.0* 7.2 5.6* 6.1*  ALBUMIN 3.1* 3.6 3.2* 3.2*    No results for input(s): "LIPASE", "AMYLASE" in the last 168 hours. No results for input(s): "AMMONIA" in the last 168 hours.  Coagulation Profile: No results for input(s): "INR", "PROTIME" in the last 168 hours.  Cardiac Enzymes: Recent Labs  Lab 10/24/21 2239  CKTOTAL 110     BNP (last 3 results) No results for input(s): "PROBNP" in the last 8760  hours.  Lipid Profile: No results for input(s): "CHOL", "HDL", "LDLCALC", "TRIG", "CHOLHDL", "LDLDIRECT" in the last 72 hours.  Thyroid Function Tests: Recent Labs    10/24/21 2239  TSH 1.844     Anemia Panel: Recent Labs    10/25/21 1535  TIBC 267  IRON 78    Urine analysis:    Component Value Date/Time   COLORURINE STRAW (A) 10/24/2021 1700   APPEARANCEUR CLEAR 10/24/2021 1700   APPEARANCEUR Clear 10/24/2017 1710   LABSPEC 1.020 10/24/2021 1700   PHURINE 7.5  10/24/2021 1700   GLUCOSEU 100 (A) 10/24/2021 1700   HGBUR TRACE (A) 10/24/2021 1700   BILIRUBINUR NEGATIVE 10/24/2021 1700   BILIRUBINUR negative 08/10/2020 1452   BILIRUBINUR Negative 10/24/2017 1710   KETONESUR NEGATIVE 10/24/2021 1700   PROTEINUR >=300 (A) 10/24/2021 1700   UROBILINOGEN 0.2 08/10/2020 1452   NITRITE NEGATIVE 10/24/2021 1700   LEUKOCYTESUR NEGATIVE 10/24/2021 1700    Sepsis Labs: Lactic Acid, Venous No results found for: "LATICACIDVEN"  MICROBIOLOGY: No results found for this or any previous visit (from the past 240 hour(s)).  RADIOLOGY STUDIES/RESULTS: DG Chest 2 View  Result Date: 10/24/2021 CLINICAL DATA:  Hypertension EXAM: CHEST - 2 VIEW COMPARISON:  Chest x-ray 10/22/2021 FINDINGS: The heart is enlarged, unchanged. The lungs are clear. There is no pleural effusion or pneumothorax. No acute fractures are seen. There is a stable calcified granuloma in the right upper lobe. IMPRESSION: 1. No active cardiopulmonary disease. 2. Stable cardiomegaly. Electronically Signed   By: Ronney Asters M.D.   On: 10/24/2021 17:43   CT Head Wo Contrast  Result Date: 10/24/2021 CLINICAL DATA:  Headache, hypertension EXAM: CT HEAD WITHOUT CONTRAST TECHNIQUE: Contiguous axial images were obtained from the base of the skull through the vertex without intravenous contrast. RADIATION DOSE REDUCTION: This exam was performed according to the departmental dose-optimization program which includes automated  exposure control, adjustment of the mA and/or kV according to patient size and/or use of iterative reconstruction technique. COMPARISON:  None Available. FINDINGS: Brain: No evidence of acute infarction, hemorrhage, mass, mass effect, or midline shift. No hydrocephalus or extra-axial fluid collection. Vascular: No hyperdense vessel. Skull: Normal. Negative for fracture or focal lesion. Sinuses/Orbits: No acute finding. Other: The mastoid air cells are well aerated. IMPRESSION: No acute intracranial process. Electronically Signed   By: Merilyn Baba M.D.   On: 10/24/2021 17:30     LOS: 2 days   Oren Binet, MD  [facility]    To contact the attending provider between 7A-7P or the covering provider during after hours 7P-7A, please log into the web site [email or web address] and access using universal [place] password for that web site. If you do not have the password, please call the hospital operator.  10/26/2021, 10:00 AM

## 2021-10-26 NOTE — Progress Notes (Signed)
OT Cancellation Note  Patient Details Name: [name] MRN: [id number] DOB: 1960-03-30   Cancelled Treatment:    Reason Eval/Treat Not Completed: Patient at procedure or test/ unavailable (pt off unit for HD, will follow up as schedule permits)  Lynnda Child, OTD, OTR/L Acute Rehab [phone]   Kaylyn Lim 10/26/2021, 1:00 PM

## 2021-10-26 NOTE — Progress Notes (Signed)
Received patient in bed to unit.  Alert and oriented.  Informed consent signed and in chart.   Treatment initiated: 1210 Treatment completed: 1524  Patient tolerated well.  Transported back to the room  Alert, without acute distress.  Hand-off given to patient's nurse.   Access used: LAVF Access issues: None  Total UF removed: 1600 (2000 - 400 NS post HD for cramping) Medication(s) given: 2000 units heparin Post HD VS: 185/76, HR 61, Resp 15, SPO2 98% Post HD weight: 82.7   [facility]

## 2021-10-27 DIAGNOSIS — I160 Hypertensive urgency: Secondary | ICD-10-CM | POA: Diagnosis not present

## 2021-10-27 DIAGNOSIS — N186 End stage renal disease: Secondary | ICD-10-CM | POA: Diagnosis not present

## 2021-10-27 DIAGNOSIS — I10 Essential (primary) hypertension: Secondary | ICD-10-CM | POA: Diagnosis not present

## 2021-10-27 DIAGNOSIS — I2510 Atherosclerotic heart disease of native coronary artery without angina pectoris: Secondary | ICD-10-CM | POA: Diagnosis not present

## 2021-10-27 LAB — HEPATITIS B SURFACE ANTIBODY, QUANTITATIVE: Hep B S AB Quant (Post): <3.1 m[IU]/mL — ABNORMAL LOW (ref >9.9)

## 2021-10-27 MED ORDER — DARBEPOETIN ALFA 40 MCG/0.4ML IJ SOSY
40.0000 ug | PREFILLED_SYRINGE | INTRAMUSCULAR | Status: DC
  Administered 2021-10-27: 40 ug via INTRAVENOUS
  Filled 2021-10-27: qty 0.4

## 2021-10-27 NOTE — Plan of Care (Signed)
  Problem: Coping: Goal: Ability to adjust to condition or change in health will improve Outcome: Progressing   Problem: Fluid Volume: Goal: Ability to maintain a balanced intake and output will improve Outcome: Progressing   Problem: Health Behavior/Discharge Planning: Goal: Ability to manage health-related needs will improve Outcome: Progressing   Problem: Tissue Perfusion: Goal: Adequacy of tissue perfusion will improve Outcome: Progressing

## 2021-10-27 NOTE — Progress Notes (Signed)
New Dialysis Start   Patient identified as new dialysis start. Kidney Education packet assembled and given. Discussed the following items with patient:    Current medications and possible changes once started:  Discussed that patient's medications may change over time.  Ex; hypertension medications and diabetes medication.  Nephrologists will adjust as needed.  Fluid restrictions reviewed:  32 oz daily goal:  All liquids count; soups, ice, jello   Phosphorus and potassium: Handout given showing high potassium and phosphorus foods.  Alternative food and drink options given.  Family support:  no family present  Outpatient Clinic Resources:  Discussed roles of Outpatient clinic  staff and advised to make a list of needs, if any, to talk with outpatient staff if needed  Care plan schedule: Informed patient and family member of Care Plans in outpatient setting and to participate in the care plan.  An invitation would be given from outpatient clinic.   Dialysis Access Options:  Reviewed access options with patients. Discussed in detail about care at home with new AVG & AVF. Reviewed checking bruit and thrill. If dialysis catheter present, educated that patient could not take showers.  Catheter dressing changes were to be done by outpatient clinic staff only  Home therapy options:  Educated patient about home therapy options:  PD vs home hemo.  Patient stated she is interested at this time in home therapies.  [place] will complete a home visit today.     Patient verbalized understanding. Will continue to round on patient during admission.    Lilia Argue, RN

## 2021-10-27 NOTE — Evaluation (Signed)
Occupational Therapy Evaluation Patient Details Name: [name] MRN: [id number] DOB: 04/27/1960 Today's Date: 10/27/2021   History of Present Illness Pt is a 61 y/o F presenting to ED on 10/12 with worsening headaches, found to have uncontrolled HTN in the setting of probable progression to ESRD. PMH includes ESRD not on HD, HTN, DM and CKD V.   Clinical Impression   Pt lives with aunt and son, is independent with ADLs/IADLs and mobility. Pt currently at baseline, completing ADLs, bed mobility, and transfers/ambulation with mod I and no AD. Pt BP 162/65 (95) seated EOB, and 174/62 (96) after hallway ambulation. Pt presenting with impairments listed below, however has no acute OT needs at this time, will s/o. Recommend d/c home with family assistance.     Recommendations for follow up therapy are one component of a multi-disciplinary discharge planning process, led by the attending physician.  Recommendations may be updated based on patient status, additional functional criteria and insurance authorization.   Follow Up Recommendations  No OT follow up    Assistance Recommended at Discharge PRN  Patient can return home with the following Assistance with cooking/housework;Assist for transportation    Functional Status Assessment  Patient has had a recent decline in their functional status and demonstrates the ability to make significant improvements in function in a reasonable and predictable amount of time.  Equipment Recommendations  None recommended by OT    Recommendations for Other Services PT consult     Precautions / Restrictions Precautions Precautions: None Restrictions Weight Bearing Restrictions: No      Mobility Bed Mobility Overal bed mobility: Modified Independent                  Transfers Overall transfer level: Modified independent                        Balance Overall balance assessment: No apparent balance deficits (not formally  assessed)                                         ADL either performed or assessed with clinical judgement   ADL Overall ADL's : At baseline                                       General ADL Comments: pt completing LB dressing, UB dressing, toilet transfer, and hallway ambulation mod I     Vision   Vision Assessment?: No apparent visual deficits     Perception     Praxis      Pertinent Vitals/Pain Pain Assessment Pain Assessment: No/denies pain     Hand Dominance     Extremity/Trunk Assessment Upper Extremity Assessment Upper Extremity Assessment: Overall WFL for tasks assessed   Lower Extremity Assessment Lower Extremity Assessment: Overall WFL for tasks assessed   Cervical / Trunk Assessment Cervical / Trunk Assessment: Normal   Communication Communication Communication: No difficulties   Cognition Arousal/Alertness: Awake/alert Behavior During Therapy: WFL for tasks assessed/performed Overall Cognitive Status: Within Functional Limits for tasks assessed                                       General Comments  BP 162/65 (  95) pre mobility, BP 174/62 (96) post mobility    Exercises     Shoulder Instructions      Home Living Family/patient expects to be discharged to:: Private residence Living Arrangements: Children;Other relatives (aunt and son) Available Help at Discharge: Family Type of Home: House Home Access: Stairs to enter Technical brewer of Steps: 2, 3   Home Layout: One level     Bathroom Shower/Tub: Tub/shower unit         Home Equipment: None          Prior Functioning/Environment Prior Level of Function : Independent/Modified Independent             Mobility Comments: no AD use ADLs Comments: works in Nurse, adult Problem List:        OT Treatment/Interventions:      OT Goals(Current goals can be found in the care plan section) Acute Rehab OT  Goals Patient Stated Goal: none stated OT Goal Formulation: With patient Time For Goal Achievement: 11/10/21 Potential to Achieve Goals: Good  OT Frequency:      Co-evaluation              AM-PAC OT "6 Clicks" Daily Activity     Outcome Measure Help from another person eating meals?: None Help from another person taking care of personal grooming?: None Help from another person toileting, which includes using toliet, bedpan, or urinal?: None Help from another person bathing (including washing, rinsing, drying)?: None Help from another person to put on and taking off regular upper body clothing?: None Help from another person to put on and taking off regular lower body clothing?: None 6 Click Score: 24   End of Session Equipment Utilized During Treatment: Gait belt Nurse Communication: Mobility status  Activity Tolerance: Patient tolerated treatment well Patient left: in bed;with call bell/phone within reach;with bed alarm set  OT Visit Diagnosis: Muscle weakness (generalized) (M62.81)                Time: 3149-7026 OT Time Calculation (min): 16 min Charges:  OT General Charges $OT Visit: 1 Visit OT Evaluation $OT Eval Low Complexity: 1 Low  Lynnda Child, OTD, OTR/L Acute Rehab [phone] - [place] 10/27/2021, 12:11 PM

## 2021-10-27 NOTE — TOC Progression Note (Signed)
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  TOC following.        Expected Discharge Plan and Services                                                 Social Determinants of Health (SDOH) Interventions    Readmission Risk Interventions     No data to display

## 2021-10-27 NOTE — Progress Notes (Signed)
Patient felt dizzy, SHOB, lightheaded. Went to patients room and she said she was feeling better already. VS WNL. Will continue to monitor patient.

## 2021-10-27 NOTE — Plan of Care (Signed)
  Problem: Coping: Goal: Ability to adjust to condition or change in health will improve Outcome: Progressing   Problem: Fluid Volume: Goal: Ability to maintain a balanced intake and output will improve Outcome: Progressing   Problem: Health Behavior/Discharge Planning: Goal: Ability to identify and utilize available resources and services will improve Outcome: Progressing   Problem: Health Behavior/Discharge Planning: Goal: Ability to manage health-related needs will improve Outcome: Progressing   

## 2021-10-27 NOTE — Progress Notes (Signed)
PROGRESS NOTE        PATIENT DETAILS Name: [name] Age: 61 y.o. Sex: female Date of Birth: [birth year] Admit Date: 10/24/2021 Admitting Physician Toy Baker, MD OFB:PZWCHEN, No Pcp Per  Brief Summary: Patient is a 61 y.o.  female CKD stage V, HTN, normocytic anemia, nonobstructive CAD, HFpEF, -who presented with worsening headaches-found to have uncontrolled hypertension in the setting of probable progression to ESRD and subsequently admitted to the hospital service  Significant events: 10/14>> admitted TRH-hypertensive urgency-progression to ESRD.  Significant studies: 10/14>> CT head: No acute intracranial process 10/14>> CXR: No PNA/edema.  Significant microbiology data: None  Procedures: None  Consults: Renal  Subjective: No headaches.  Objective: Vitals: Blood pressure (!) 187/71, pulse 61, temperature 97.6 F (36.4 C), temperature source Oral, resp. rate 18, height '5\' 4"'$  (1.626 m), weight 81.6 kg, last menstrual period 12/14/2013, SpO2 96 %.   Exam: Gen Exam:Alert awake-not in any distress HEENT:atraumatic, normocephalic Chest: B/L clear to auscultation anteriorly CVS:S1S2 regular Abdomen:soft non tender, non distended Extremities:no edema Neurology: Non focal Skin: no rash   Pertinent Labs/Radiology:    Latest Ref Rng & Units 10/26/2021   12:09 PM 10/25/2021    6:15 AM 10/24/2021   10:49 PM  CBC  WBC 4.0 - 10.5 K/uL 6.4  8.5    Hemoglobin 12.0 - 15.0 g/dL 7.3  7.6  7.8   Hematocrit 36.0 - 46.0 % 23.7  23.8  23.0   Platelets 150 - 400 K/uL 195  200      Lab Results  Component Value Date   NA 138 10/26/2021   K 3.5 10/26/2021   CL 98 10/26/2021   CO2 27 10/26/2021      Assessment/Plan: Hypertensive urgency BP much better after HD x2-although still slightly Continue clonidine/amlodipine/Coreg/hydralazine.   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  Obesity: Estimated body mass index is 30.86 kg/m as calculated from the following:   Height as of this encounter: '5\' 4"'$  (1.626 m).   Weight as of this encounter: 81.6 kg.   Code status:   Code Status: Full Code   DVT Prophylaxis: heparin injection 5,000 Units Start: 10/25/21 0930 SCDs Start: 10/24/21 2237    Family Communication: None at bedside   Disposition Plan: Status is: Inpatient Remains inpatient appropriate because: Uncontrolled hypertension-progression to ESRD-awaiting initiation of HD.  Not stable for discharge.   Planned Discharge Destination:Home   Diet: Diet Order             Diet 2 gram sodium Room service appropriate? Yes; Fluid consistency: Thin; Fluid restriction: 1200 mL Fluid  Diet effective now                     Antimicrobial agents: Anti-infectives (From admission, onward)    None        MEDICATIONS: Scheduled Meds:  amLODipine  10 mg Oral Daily   aspirin EC  81 mg Oral QHS   carvedilol  25 mg Oral BID WC  Chlorhexidine Gluconate Cloth  6 each Topical Q0600   Chlorhexidine Gluconate Cloth  6 each Topical Q0600   cloNIDine  0.2 mg Oral TID   darbepoetin (ARANESP) injection - DIALYSIS  40 mcg Intravenous Q Tue-HD   heparin injection (subcutaneous)  5,000 Units Subcutaneous Q8H   hydrALAZINE  100 mg Oral TID   multivitamin  1 tablet Oral QHS   sevelamer carbonate  1,600 mg Oral TID WC   sodium bicarbonate  1,950 mg Oral BID   sodium chloride flush  3 mL Intravenous Q12H   Continuous Infusions:  sodium chloride     PRN Meds:.sodium chloride, acetaminophen **OR**  acetaminophen, hydrALAZINE, HYDROcodone-acetaminophen, labetalol, sodium chloride flush, traMADol   I have personally reviewed following labs and imaging studies  LABORATORY DATA: CBC: Recent Labs  Lab 10/22/21 1432 10/24/21 1738 10/24/21 2249 10/25/21 0615 10/26/21 1209  WBC 8.8 11.0*  --  8.5 6.4  NEUTROABS 6.1 8.3*  --   --   --   HGB 7.4* 8.0* 7.8* 7.6* 7.3*  HCT 23.2* 25.4* 23.0* 23.8* 23.7*  MCV 91.7 89.8  --  90.5 92.2  PLT 186 214  --  200 195     Basic Metabolic Panel: Recent Labs  Lab 10/22/21 1432 10/24/21 1738 10/24/21 2239 10/24/21 2249 10/25/21 0615 10/26/21 1209  NA 138 137  --  140 138 138  K 3.9 3.6  --  3.4* 3.7 3.5  CL 102 101  --   --  102 98  CO2 25 22  --   --  22 27  GLUCOSE 102* 114*  --   --  100* 133*  BUN 107* 99*  --   --  107* 72*  CREATININE 13.71* 13.45*  --   --  14.55* 10.90*  CALCIUM 8.3* 7.9*  --   --  8.2* 7.9*  MG  --   --  2.3  --  2.3  --   PHOS  --   --  6.3*  --  6.7* 4.8*     GFR: Estimated Creatinine Clearance: 5.7 mL/min (A) (by C-G formula based on SCr of 10.9 mg/dL (H)).  Liver Function Tests: Recent Labs  Lab 10/22/21 1432 10/24/21 1738 10/24/21 2239 10/25/21 0615 10/26/21 1209  AST 14* 15 12* 13*  --   ALT '18 16 14 13  '$ --   ALKPHOS 37* 35* 31* 28*  --   BILITOT 0.7 0.4 0.5 0.6  --   PROT 6.0* 7.2 5.6* 6.1*  --   ALBUMIN 3.1* 3.6 3.2* 3.2* 3.0*    No results for input(s): "LIPASE", "AMYLASE" in the last 168 hours. No results for input(s): "AMMONIA" in the last 168 hours.  Coagulation Profile: No results for input(s): "INR", "PROTIME" in the last 168 hours.  Cardiac Enzymes: Recent Labs  Lab 10/24/21 2239  CKTOTAL 110     BNP (last 3 results) No results for input(s): "PROBNP" in the last 8760 hours.  Lipid Profile: No results for input(s): "CHOL", "HDL", "LDLCALC", "TRIG", "CHOLHDL", "LDLDIRECT" in the last 72 hours.  Thyroid Function Tests: Recent Labs    10/24/21 2239  TSH 1.844      Anemia Panel: Recent Labs    10/25/21 1535  TIBC 267  IRON 78     Urine analysis:    Component Value Date/Time   COLORURINE STRAW (A) 10/24/2021 1700   APPEARANCEUR CLEAR 10/24/2021 1700   APPEARANCEUR Clear 10/24/2017 1710   LABSPEC 1.020 10/24/2021 1700   PHURINE 7.5 10/24/2021  1700   GLUCOSEU 100 (A) 10/24/2021 1700   HGBUR TRACE (A) 10/24/2021 1700   BILIRUBINUR NEGATIVE 10/24/2021 1700   BILIRUBINUR negative 08/10/2020 1452   BILIRUBINUR Negative 10/24/2017 1710   KETONESUR NEGATIVE 10/24/2021 1700   PROTEINUR >=300 (A) 10/24/2021 1700   UROBILINOGEN 0.2 08/10/2020 1452   NITRITE NEGATIVE 10/24/2021 1700   LEUKOCYTESUR NEGATIVE 10/24/2021 1700    Sepsis Labs: Lactic Acid, Venous No results found for: "LATICACIDVEN"  MICROBIOLOGY: No results found for this or any previous visit (from the past 240 hour(s)).  RADIOLOGY STUDIES/RESULTS: No results found.   LOS: 3 days   Oren Binet, MD  [facility]    To contact the attending provider between 7A-7P or the covering provider during after hours 7P-7A, please log into the web site [email or web address] and access using universal [facility] password for that web site. If you do not have the password, please call the hospital operator.  10/27/2021, 10:37 AM

## 2021-10-27 NOTE — Progress Notes (Signed)
Requested to see pt for out-pt HD needs at d/c. Spoke to pt via phone. Introduced self and explained role. Pt vices interest in PD in the near future. Nephrologist feels pt would be a great candidate for TCU at [facility] (4x's a week and education for home therapies). Discussed option of TCU with pt. At this time, pt has concerns about TCU at the time they have a chair available. Pt works a F/T job and part-time job. Pt does not plan to take any time off from work at this time and needs to discuss options with employer. Pt requests that navigator f/u with her tomorrow. Will assist as needed.   Melven Sartorius Renal Navigator [phone]

## 2021-10-27 NOTE — Progress Notes (Signed)
Report given to current nurse.

## 2021-10-27 NOTE — Progress Notes (Signed)
Received patient in bed to unit.  Alert and oriented.  Informed consent signed and in chart.   Treatment initiated: 1300 Treatment completed: 1600  Patient tolerated well.  Transported back to the room  Alert, without acute distress.  Hand-off given to patient's nurse.   Access used: L AVF Access issues: None  Total UF removed: 2230m Medication(s) given: Aranesp 440m IV Post HD VS: 98.3, 143/68, 55, 15, 98% on RA Post HD weight: 80.5kg   [facility]

## 2021-10-27 NOTE — Progress Notes (Signed)
[facility] Progress Note    Assessment/ Plan:   CKD 5, now ESRD - w/ uremic symptoms of progressive fatigue and loss of appetite.L arm AVF created in early 2022.  Slow start protocol. HD#1 10/16 (overnight), #2 10/16. # planned for today CLIP for outpatient HD placement. Of note, she is in the process of getting set up for home PD. Can benefit from transitional care unit Uncont HTN/volume - on 4 BP lowering meds at home, BP's uncontrolled here but improving. Euvolemic on exam. Will UF as tolerated Anemia esrd - Hb 7- 8s here, ESA to start 10/17 MBD ckd - taking binder, cont here. CCa in range, add on phos.  COPD-per primary service  Gean Quint, MD [facility]   Subjective:   No acute events. S/p #2 treatment yesterday. Open for #3 treatment today. BP better. No other complaints   Objective:   BP (!) 187/71 (BP Location: Right Arm)   Pulse 61   Temp 97.6 F (36.4 C) (Oral)   Resp 18   Ht '5\' 4"'$  (1.626 m)   Wt 81.6 kg   LMP 12/14/2013   SpO2 96%   BMI 30.86 kg/m   Intake/Output Summary (Last 24 hours) at 10/27/2021 4098 Last data filed at 10/27/2021 0509 Gross per 24 hour  Intake 960 ml  Output 2550 ml  Net -1590 ml   Weight change: -0.3 kg  Physical Exam: Gen: NAD CVS: S1S2, RRR Resp: cta bl, normal WOB Abd: soft, nt/nd Ext: no sig edema Neuro: awake, alert Dialysis access: LUE AVF +b/t  Imaging: No results found.  Labs: BMET Recent Labs  Lab 10/22/21 1432 10/24/21 1738 10/24/21 2239 10/24/21 2249 10/25/21 0615 10/26/21 1209  NA 138 137  --  140 138 138  K 3.9 3.6  --  3.4* 3.7 3.5  CL 102 101  --   --  102 98  CO2 25 22  --   --  22 27  GLUCOSE 102* 114*  --   --  100* 133*  BUN 107* 99*  --   --  107* 72*  CREATININE 13.71* 13.45*  --   --  14.55* 10.90*  CALCIUM 8.3* 7.9*  --   --  8.2* 7.9*  PHOS  --   --  6.3*  --  6.7* 4.8*   CBC Recent Labs  Lab 10/22/21 1432 10/24/21 1738 10/24/21 2249  10/25/21 0615 10/26/21 1209  WBC 8.8 11.0*  --  8.5 6.4  NEUTROABS 6.1 8.3*  --   --   --   HGB 7.4* 8.0* 7.8* 7.6* 7.3*  HCT 23.2* 25.4* 23.0* 23.8* 23.7*  MCV 91.7 89.8  --  90.5 92.2  PLT 186 214  --  200 195    Medications:     amLODipine  10 mg Oral Daily   aspirin EC  81 mg Oral QHS   carvedilol  25 mg Oral BID WC   Chlorhexidine Gluconate Cloth  6 each Topical Q0600   Chlorhexidine Gluconate Cloth  6 each Topical Q0600   cloNIDine  0.2 mg Oral TID   heparin injection (subcutaneous)  5,000 Units Subcutaneous Q8H   hydrALAZINE  100 mg Oral TID   multivitamin  1 tablet Oral QHS   sevelamer carbonate  1,600 mg Oral TID WC   sodium bicarbonate  1,950 mg Oral BID   sodium chloride flush  3 mL Intravenous Q12H      Gean Quint, MD [facility] 10/27/2021, 9:22 AM

## 2021-10-28 ENCOUNTER — Encounter (HOSPITAL_COMMUNITY): Payer: Self-pay | Admitting: Vascular Surgery

## 2021-10-28 DIAGNOSIS — N186 End stage renal disease: Secondary | ICD-10-CM | POA: Diagnosis not present

## 2021-10-28 DIAGNOSIS — I160 Hypertensive urgency: Secondary | ICD-10-CM | POA: Diagnosis not present

## 2021-10-28 DIAGNOSIS — N185 Chronic kidney disease, stage 5: Secondary | ICD-10-CM | POA: Diagnosis not present

## 2021-10-28 DIAGNOSIS — D649 Anemia, unspecified: Secondary | ICD-10-CM

## 2021-10-28 LAB — PARATHYROID HORMONE, INTACT (NO CA): PTH: 103 pg/mL — ABNORMAL HIGH (ref 15–65)

## 2021-10-28 LAB — COMPREHENSIVE METABOLIC PANEL
ALT: 19 U/L (ref 0–44)
AST: 22 U/L (ref 15–41)
Albumin: 3.3 g/dL — ABNORMAL LOW (ref 3.5–5.0)
Alkaline Phosphatase: 32 U/L — ABNORMAL LOW (ref 38–126)
Anion gap: 13 (ref 5–15)
BUN: 37 mg/dL — ABNORMAL HIGH (ref 6–20)
CO2: 29 mmol/L (ref 22–32)
Calcium: 8.4 mg/dL — ABNORMAL LOW (ref 8.9–10.3)
Chloride: 95 mmol/L — ABNORMAL LOW (ref 98–111)
Creatinine, Ser: 7.19 mg/dL — ABNORMAL HIGH (ref 0.44–1.00)
GFR, Estimated: 6 mL/min — ABNORMAL LOW (ref >60)
Glucose, Bld: 148 mg/dL — ABNORMAL HIGH (ref 70–99)
Potassium: 3.4 mmol/L — ABNORMAL LOW (ref 3.5–5.1)
Sodium: 137 mmol/L (ref 135–145)
Total Bilirubin: 0.2 mg/dL — ABNORMAL LOW (ref 0.3–1.2)
Total Protein: 6.4 g/dL — ABNORMAL LOW (ref 6.5–8.1)

## 2021-10-28 LAB — MAGNESIUM: Magnesium: 1.8 mg/dL (ref 1.7–2.4)

## 2021-10-28 MED ORDER — CARVEDILOL 12.5 MG PO TABS
12.5000 mg | ORAL_TABLET | Freq: Two times a day (BID) | ORAL | Status: DC
  Administered 2021-10-28 – 2021-10-30 (×4): 12.5 mg via ORAL
  Filled 2021-10-28 (×4): qty 1

## 2021-10-28 NOTE — Progress Notes (Signed)
[name] Progress Note    Assessment/ Plan:   CKD 5, now ESRD - w/ uremic symptoms of progressive fatigue and loss of appetite.L arm AVF created in early 2022.  Slow start protocol. HD#1 10/16 (overnight), #2 10/16. # 3 10/17. HD planned for tomorrow CLIP for outpatient HD placement. Outpatient PD currently on hold given home visit. Uncont HTN/volume - on 4 BP lowering meds at home, BP's uncontrolled here but improving. Euvolemic on exam. BP improved Will UF as tolerated Anemia esrd - Hb 7- 8s here, ESA started 10/17 MBD ckd - taking binder, cont here. CCa in range, PO4 acceptable COPD-per primary service  Gean Quint, MD [facility]   Subjective:   No acute events. S/p #3hd yesterday, tolerated. Had some leg cramping but otherwise felt okay. No complaints currently Patient reports that she had her home visit for PD yesterday and she would like to put off home therapies for now given that there is a few things she needs to take care of with her house before moving forward with PD.   Objective:   BP 131/65   Pulse (!) 58   Temp 97.7 F (36.5 C) (Oral)   Resp 18   Ht '5\' 4"'$  (1.626 m)   Wt 80.5 kg   LMP 12/14/2013   SpO2 95%   BMI 30.46 kg/m   Intake/Output Summary (Last 24 hours) at 10/28/2021 0916 Last data filed at 10/28/2021 0753 Gross per 24 hour  Intake 300 ml  Output 2500 ml  Net -2200 ml   Weight change: -2.3 kg  Physical Exam: Gen: NAD, laying flat in bed CVS: S1S2, RRR Resp: cta bl, normal WOB Abd: soft, nt/nd Ext: no sig edema Neuro: awake, alert Dialysis access: LUE AVF +b/t  Imaging: No results found.  Labs: BMET Recent Labs  Lab 10/22/21 1432 10/24/21 1738 10/24/21 2239 10/24/21 2249 10/25/21 0615 10/26/21 1209  NA 138 137  --  140 138 138  K 3.9 3.6  --  3.4* 3.7 3.5  CL 102 101  --   --  102 98  CO2 25 22  --   --  22 27  GLUCOSE 102* 114*  --   --  100* 133*  BUN 107* 99*  --   --  107* 72*   CREATININE 13.71* 13.45*  --   --  14.55* 10.90*  CALCIUM 8.3* 7.9*  --   --  8.2* 7.9*  PHOS  --   --  6.3*  --  6.7* 4.8*   CBC Recent Labs  Lab 10/22/21 1432 10/24/21 1738 10/24/21 2249 10/25/21 0615 10/26/21 1209  WBC 8.8 11.0*  --  8.5 6.4  NEUTROABS 6.1 8.3*  --   --   --   HGB 7.4* 8.0* 7.8* 7.6* 7.3*  HCT 23.2* 25.4* 23.0* 23.8* 23.7*  MCV 91.7 89.8  --  90.5 92.2  PLT 186 214  --  200 195    Medications:     amLODipine  10 mg Oral Daily   aspirin EC  81 mg Oral QHS   carvedilol  25 mg Oral BID WC   Chlorhexidine Gluconate Cloth  6 each Topical Q0600   cloNIDine  0.2 mg Oral TID   darbepoetin (ARANESP) injection - DIALYSIS  40 mcg Intravenous Q Tue-HD   heparin injection (subcutaneous)  5,000 Units Subcutaneous Q8H   hydrALAZINE  100 mg Oral TID   multivitamin  1 tablet Oral QHS   sevelamer carbonate  1,600  mg Oral TID WC   sodium bicarbonate  1,950 mg Oral BID   sodium chloride flush  3 mL Intravenous Q12H      Gean Quint, MD [facility] 10/28/2021, 9:16 AM

## 2021-10-28 NOTE — Plan of Care (Signed)
  Problem: Coping: Goal: Ability to adjust to condition or change in health will improve Outcome: Progressing   Problem: Activity: Goal: Risk for activity intolerance will decrease Outcome: Progressing   Problem: Safety: Goal: Ability to remain free from injury will improve Outcome: Progressing

## 2021-10-28 NOTE — Progress Notes (Signed)
  Progress Note   Patient: [name] VHQ:[id number] DOB: 1960/07/30 DOA: 10/24/2021     4 DOS: the patient was seen and examined on 10/28/2021   Brief hospital course: 61 y.o.  female CKD stage V, HTN, normocytic anemia, nonobstructive CAD, HFpEF, -who presented with worsening headaches-found to have uncontrolled hypertension in the setting of probable progression to ESRD and subsequently admitted to the hospital service  Assessment and Plan: Hypertensive urgency BP now much improved Continue clonidine/amlodipine/Coreg/hydralazine.  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   Obesity: Estimated body mass index is 30.86 kg/m as calculated from the following:   Height as of this encounter: '5\' 4"'$  (1.626 m).   Weight as of this encounter: 81.6 kg.       Subjective: Complains of not sleeping well secondary to be awaken multiple times overnight by staff  Physical Exam: Vitals:   10/27/21 2335 10/28/21 0327 10/28/21 0720 10/28/21 1132  BP: 122/66 (!) 143/66 131/65 (!) 130/53  Pulse: 60 60 (!) 58 (!) 58  Resp: '16 17 18 17  '$ Temp: (!) 97.4 F (36.3 C) 97.7 F (36.5 C) 97.7 F (36.5 C) 98 F (36.7 C)  TempSrc: Oral Oral Oral Oral  SpO2: 96% 95% 95% 92%  Weight:      Height:       General exam: Awake, laying in bed, in nad Respiratory system: Normal respiratory effort, no wheezing Cardiovascular system: regular rate, s1, s2 Gastrointestinal system: Soft, nondistended, positive BS Central nervous system: CN2-12 grossly intact, strength intact Extremities: Perfused, no  clubbing Skin: Normal skin turgor, no notable skin lesions seen Psychiatry: Mood normal // no visual hallucinations   Data Reviewed:  Labs reviewed: Na 137, K 3.4, Cr 7.19  Family Communication: Pt in room, family not at bedside  Disposition: Status is: Inpatient Remains inpatient appropriate because: Severity of illness  Planned Discharge Destination: Home    Author: Marylu Lund, MD 10/28/2021 2:33 PM  For on call review [email or web address].

## 2021-10-28 NOTE — Progress Notes (Signed)
Met with pt at bedside. Pt informs navigator that there are some things that she needs to address in the future before she can proceed with PD. Pt prefers a 3x's a week in-center chair due to pt works a F/T and P/T job. Pt prefers [facility] which is the closest clinic to pt's home. Referral made to [facility] admissions this morning. Pt states that her son will assist with transportation at first and then pt hopes to be able to drive self. Will assist as needed.   Tracy Mounce Renal Navigator [phone] 

## 2021-10-28 NOTE — Plan of Care (Signed)
  Problem: Coping: Goal: Ability to adjust to condition or change in health will improve 10/28/2021 2140 by Barton Dubois, RN Outcome: Progressing 10/28/2021 2138 by Barton Dubois, RN Outcome: Progressing   Problem: Activity: Goal: Risk for activity intolerance will decrease Outcome: Progressing   Problem: Safety: Goal: Ability to remain free from injury will improve 10/28/2021 2140 by Barton Dubois, RN Outcome: Progressing 10/28/2021 2138 by Barton Dubois, RN Outcome: Progressing

## 2021-10-28 NOTE — Hospital Course (Signed)
61 y.o.  female CKD stage V, HTN, normocytic anemia, nonobstructive CAD, HFpEF, -who presented with worsening headaches-found to have uncontrolled hypertension in the setting of probable progression to ESRD and subsequently admitted to the hospital service

## 2021-10-28 NOTE — Progress Notes (Signed)
Nutrition Follow-up  DOCUMENTATION CODES:   Not applicable  INTERVENTION:  - Provided Renal diet ed.   - Add Boost Breeze po TID, each supplement provides 250 kcal and 9 grams of protein  NUTRITION DIAGNOSIS:   Increased nutrient needs related to chronic illness (ESRD) as evidenced by estimated needs.  GOAL:   Patient will meet greater than or equal to 90% of their needs  MONITOR:   PO intake, Diet advancement, Labs  REASON FOR ASSESSMENT:   Consult Other (Comment) (nutrition goals)  ASSESSMENT:   Pt admitted with uncontrolled hypertension. PMH significant for CKD stage V, mature AVF, not on HD yet, CAD, COPD, T2DM, HTN  Meds include: Rena-vit, sodium bicarbonate.   The pt reports that she still has a poor appetite. She states that she is not interested in a protein shake due to the consistency. Pt is agreeable to try Boost Breeze. Per record, pt has been eating 75-100% of her meals over the past 48 hrs.    MD consult for new PD patient and CKD diet education.   Nutrition Education Note  RD consulted for Renal Education. Provided  Reviewed food groups and provided written recommended serving sizes specifically determined for patient's current nutritional status.   Explained why diet restrictions are needed and provided lists of foods to limit/avoid that are high potassium, sodium, and phosphorus. Provided specific recommendations on safer alternatives of these foods. Strongly encouraged compliance of this diet.   Discussed importance of protein intake at each meal and snack. Provided examples of how to maximize protein intake throughout the day. Discussed need for fluid restriction with dialysis, importance of minimizing weight gain between HD treatments, and renal-friendly beverage options.  Encouraged pt to discuss specific diet questions/concerns with RD at HD outpatient facility. Teach back method used.  Expect fair compliance.  Body mass index is 30.46 kg/m. Pt  meets criteria for Obesity based on current BMI.  Current diet order is 2 gm sodium, 1200 mL restriction, patient is consuming approximately 75-100% of meals at this time. Labs and medications reviewed. No further nutrition interventions warranted at this time. RD contact information provided. If additional nutrition issues arise, please re-consult RD.    Diet Order:   Diet Order             Diet 2 gram sodium Room service appropriate? Yes; Fluid consistency: Thin; Fluid restriction: 1200 mL Fluid  Diet effective now                   EDUCATION NEEDS:   Education needs have been addressed  Skin:  Skin Assessment: Reviewed RN Assessment  Last BM:  10/27/21  Height:   Ht Readings from Last 1 Encounters:  10/25/21 '5\' 4"'$  (1.626 m)    Weight:   Wt Readings from Last 1 Encounters:  10/27/21 80.5 kg    Ideal Body Weight:  54.5 kg  BMI:  Body mass index is 30.46 kg/m.  Estimated Nutritional Needs:   Kcal:  1500-1700  Protein:  75-90g  Fluid:  1L + UOP  Rollie Hynek Graciela Husbands, RD, LDN, CNSC

## 2021-10-28 NOTE — Plan of Care (Signed)
  Problem: Coping: Goal: Ability to adjust to condition or change in health will improve Outcome: Progressing   Problem: Activity: Goal: Risk for activity intolerance will decrease Outcome: Progressing   Problem: Safety: Goal: Ability to remain free from injury will improve 10/28/2021 2140 by Barton Dubois, RN Outcome: Progressing 10/28/2021 2138 by Barton Dubois, RN Outcome: Progressing

## 2021-10-29 ENCOUNTER — Encounter (HOSPITAL_COMMUNITY): Payer: Self-pay | Admitting: Certified Registered"

## 2021-10-29 DIAGNOSIS — Z7982 Long term (current) use of aspirin: Secondary | ICD-10-CM | POA: Insufficient documentation

## 2021-10-29 DIAGNOSIS — Q613 Polycystic kidney, unspecified: Secondary | ICD-10-CM | POA: Insufficient documentation

## 2021-10-29 DIAGNOSIS — E1122 Type 2 diabetes mellitus with diabetic chronic kidney disease: Secondary | ICD-10-CM | POA: Insufficient documentation

## 2021-10-29 DIAGNOSIS — I5032 Chronic diastolic (congestive) heart failure: Secondary | ICD-10-CM | POA: Insufficient documentation

## 2021-10-29 DIAGNOSIS — I132 Hypertensive heart and chronic kidney disease with heart failure and with stage 5 chronic kidney disease, or end stage renal disease: Secondary | ICD-10-CM | POA: Insufficient documentation

## 2021-10-29 DIAGNOSIS — I160 Hypertensive urgency: Secondary | ICD-10-CM | POA: Diagnosis not present

## 2021-10-29 DIAGNOSIS — N189 Chronic kidney disease, unspecified: Secondary | ICD-10-CM | POA: Insufficient documentation

## 2021-10-29 DIAGNOSIS — Z992 Dependence on renal dialysis: Secondary | ICD-10-CM | POA: Insufficient documentation

## 2021-10-29 DIAGNOSIS — N186 End stage renal disease: Secondary | ICD-10-CM | POA: Diagnosis not present

## 2021-10-29 DIAGNOSIS — E785 Hyperlipidemia, unspecified: Secondary | ICD-10-CM | POA: Insufficient documentation

## 2021-10-29 DIAGNOSIS — Z87891 Personal history of nicotine dependence: Secondary | ICD-10-CM | POA: Insufficient documentation

## 2021-10-29 DIAGNOSIS — N185 Chronic kidney disease, stage 5: Secondary | ICD-10-CM | POA: Diagnosis not present

## 2021-10-29 DIAGNOSIS — J449 Chronic obstructive pulmonary disease, unspecified: Secondary | ICD-10-CM | POA: Insufficient documentation

## 2021-10-29 DIAGNOSIS — I2510 Atherosclerotic heart disease of native coronary artery without angina pectoris: Secondary | ICD-10-CM | POA: Insufficient documentation

## 2021-10-29 LAB — CBC
HCT: 28.2 % — ABNORMAL LOW (ref 36.0–46.0)
Hemoglobin: 8.8 g/dL — ABNORMAL LOW (ref 12.0–15.0)
MCH: 28.9 pg (ref 26.0–34.0)
MCHC: 31.2 g/dL (ref 30.0–36.0)
MCV: 92.8 fL (ref 80.0–100.0)
Platelets: 212 10*3/uL (ref 150–400)
RBC: 3.04 MIL/uL — ABNORMAL LOW (ref 3.87–5.11)
RDW: 15.1 % (ref 11.5–15.5)
WBC: 10.2 10*3/uL (ref 4.0–10.5)
nRBC: 0 % (ref 0.0–0.2)

## 2021-10-29 LAB — COMPREHENSIVE METABOLIC PANEL
ALT: 18 U/L (ref 0–44)
AST: 19 U/L (ref 15–41)
Albumin: 3.2 g/dL — ABNORMAL LOW (ref 3.5–5.0)
Alkaline Phosphatase: 32 U/L — ABNORMAL LOW (ref 38–126)
Anion gap: 17 — ABNORMAL HIGH (ref 5–15)
BUN: 51 mg/dL — ABNORMAL HIGH (ref 6–20)
CO2: 29 mmol/L (ref 22–32)
Calcium: 8.3 mg/dL — ABNORMAL LOW (ref 8.9–10.3)
Chloride: 90 mmol/L — ABNORMAL LOW (ref 98–111)
Creatinine, Ser: 8.71 mg/dL — ABNORMAL HIGH (ref 0.44–1.00)
GFR, Estimated: 5 mL/min — ABNORMAL LOW (ref >60)
Glucose, Bld: 103 mg/dL — ABNORMAL HIGH (ref 70–99)
Potassium: 4.1 mmol/L (ref 3.5–5.1)
Sodium: 136 mmol/L (ref 135–145)
Total Bilirubin: 0.3 mg/dL (ref 0.3–1.2)
Total Protein: 6.4 g/dL — ABNORMAL LOW (ref 6.5–8.1)

## 2021-10-29 MED ORDER — HEPARIN SODIUM (PORCINE) 1000 UNIT/ML DIALYSIS
2000.0000 [IU] | INTRAMUSCULAR | Status: DC | PRN
  Administered 2021-10-29: 2000 [IU] via INTRAVENOUS_CENTRAL
  Filled 2021-10-29 (×3): qty 2

## 2021-10-29 NOTE — Progress Notes (Addendum)
Pt has been accepted at [facility] on MWF 6:00 am chair time. Pt can start as soon as tomorrow. Pt will need to arrive at 5:45 am. Spoke to pt via phone to provide the above details. Also sent text to pt's cell number with details noted. Added details to AVS as well. Update provided to attending, nephrologist, RN CM, and pt's RN. Will contact renal PA for orders once d/c date is determined. Will assist as needed.   Melven Sartorius Renal Navigator [phone]  Addendum at 3:43 pm: Pt is for likely d/c today. Contacted clinic to make them aware pt will likely start tomorrow. Renal PA aware of clinic's need for orders and to send to clinic.

## 2021-10-29 NOTE — Anesthesia Preprocedure Evaluation (Deleted)
Anesthesia Evaluation    Reviewed: Allergy & Precautions, Patient's Chart, lab work & pertinent test results  Airway        Dental   Pulmonary sleep apnea , COPD, Current Smoker and Patient abstained from smoking.,           Cardiovascular hypertension, Pt. on medications and Pt. on home beta blockers + CAD       Neuro/Psych negative neurological ROS     GI/Hepatic negative GI ROS, Neg liver ROS,   Endo/Other  diabetes  Renal/GU ESRFRenal disease     Musculoskeletal negative musculoskeletal ROS (+)   Abdominal   Peds  Hematology  (+) Blood dyscrasia, anemia ,   Anesthesia Other Findings CKD V  Reproductive/Obstetrics                             Anesthesia Physical Anesthesia Plan  ASA:   Anesthesia Plan: General   Post-op Pain Management:    Induction: Intravenous  PONV Risk Score and Plan: 2 and Ondansetron, Dexamethasone and Treatment may vary due to age or medical condition  Airway Management Planned: Oral ETT  Additional Equipment:   Intra-op Plan:   Post-operative Plan: Extubation in OR  Informed Consent:   Plan Discussed with:   Anesthesia Plan Comments:         Anesthesia Quick Evaluation

## 2021-10-29 NOTE — TOC Progression Note (Addendum)
Transition of Care [facility]) - Progression Note    Patient Details  Name: [name] MRN: [id number] Date of Birth: 1960/06/10  Transition [facility]) CM/SW Contact  Zenon Mayo, RN Phone Number: 10/29/2021, 12:44 PM  Clinical Narrative:    Patient states her son and her aunt lives with her , they are her support system, she has no DME needs, she is not actively current with any HH, [facility] [address], she also gets [facility]s [facility], her Mom will transport her home at discharge.  She has had 3 diaylsis txts, needs clipping, renal navigator has clipped for OP HD.  She has hospital follow up on AVS.          Expected Discharge Plan and Services                                                 Social Determinants of Health (SDOH) Interventions Utilities Interventions: Inpatient TOC, Intervention Not Indicated  Readmission Risk Interventions     No data to display

## 2021-10-29 NOTE — Progress Notes (Signed)
  Progress Note   Patient: [name] DGU:[id number] DOB: 04/05/1960 DOA: 10/24/2021     5 DOS: the patient was seen and examined on 10/29/2021   Brief hospital course: 61 y.o.  female CKD stage V, HTN, normocytic anemia, nonobstructive CAD, HFpEF, -who presented with worsening headaches-found to have uncontrolled hypertension in the setting of probable progression to ESRD and subsequently admitted to the hospital service  Assessment and Plan: Hypertensive urgency BP now much improved Continue clonidine/amlodipine/Coreg/hydralazine.  Pt continued on HD, now has outpt HD chair arranged. 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   Obesity: Estimated body mass index is 30.86 kg/m as calculated from the following:   Height as of this encounter: '5\' 4"'$  (1.626 m).   Weight as of this encounter: 81.6 kg.       Subjective: Without complaints this AM  Physical Exam: Vitals:   10/29/21 1730 10/29/21 1800 10/29/21 1827 10/29/21 1858  BP: 114/61 (!) 104/55 (!) 110/59   Pulse: 65 (!) 58 (!) 59   Resp: '12 16 18   '$ Temp:   97.9 F (36.6 C)   TempSrc:   Oral   SpO2: 96% 98% 96%   Weight:    80.5 kg  Height:       General exam: Conversant, in no acute distress Respiratory system: normal chest rise, clear, no audible wheezing Cardiovascular system: regular rhythm, s1-s2 Gastrointestinal system: Nondistended, nontender, pos BS Central nervous system: No seizures, no tremors Extremities: No cyanosis, no joint deformities Skin: No rashes, no pallor Psychiatry: Affect normal // no  auditory hallucinations   Data Reviewed:  Labs reviewed: Na 136, K 4.1, Cr 8.71  Family Communication: Pt in room, family at bedside  Disposition: Status is: Inpatient Remains inpatient appropriate because: Severity of illness  Planned Discharge Destination: Home    Author: Marylu Lund, MD 10/29/2021 7:25 PM  For on call review [email or web address].

## 2021-10-29 NOTE — Progress Notes (Signed)
Received patient in bed to unit.  Alert and oriented.  Informed consent signed and in chart.   Treatment initiated: 1521 Treatment completed: 1827  Patient tolerated well.  Transported back to the room  Alert, without acute distress.  Hand-off given to patient's nurse.   Access used: Avfistula Access issues: none  Total UF removed: 1.5L Medication(s) given: None Post HD VS: 97.9,59,18,110/59,96% Post HD weight: 80.5kg   [facility]

## 2021-10-29 NOTE — Progress Notes (Signed)
[facility] Progress Note    Assessment/ Plan:   ESRD - h/o CKD5, p/w uremic symptoms of progressive fatigue and loss of appetite.L arm AVF created in early 2022.  Slow start protocol. HD#1 10/16 (overnight), #2 10/16. # 3 10/17. HD planned for today, will keep her on TTS schedule for now CLIP for outpatient HD placement. Outpatient PD plan currently on hold given home visit, patient wishes for TIW HD as opposed to transitional care unit Uncont HTN/volume - on 4 BP lowering meds at home, BP's uncontrolled here but has now improved. Euvolemic on exam. Will UF as tolerated Anemia esrd - Hb 7- 8s here, ESA started 10/17. Hgb up to 8.8 today. MBD ckd - taking binder, cont here. CCa in range, PO4 acceptable 10/16 COPD-per primary service  Gean Quint, MD [facility]  Subjective:   No acute events. No complaints. HD today.   Objective:   BP 135/64 (BP Location: Right Arm)   Pulse (!) 54   Temp 97.6 F (36.4 C) (Oral)   Resp 18   Ht '5\' 4"'$  (1.626 m)   Wt 83.1 kg   LMP 12/14/2013   SpO2 94%   BMI 31.45 kg/m   Intake/Output Summary (Last 24 hours) at 10/29/2021 0904 Last data filed at 10/28/2021 2100 Gross per 24 hour  Intake 600 ml  Output 25 ml  Net 575 ml   Weight change: 1.1 kg  Physical Exam: Gen: NAD CVS: RRR Resp: normal WOB Abd: soft, nt/nd Ext: no sig edema Neuro: awake, alert Dialysis access: LUE AVF +b/t  Imaging: No results found.  Labs: BMET Recent Labs  Lab 10/22/21 1432 10/24/21 1738 10/24/21 2239 10/24/21 2249 10/25/21 0615 10/26/21 1209 10/28/21 1000 10/29/21 0033  NA 138 137  --  140 138 138 137 136  K 3.9 3.6  --  3.4* 3.7 3.5 3.4* 4.1  CL 102 101  --   --  102 98 95* 90*  CO2 25 22  --   --  '22 27 29 29  '$ GLUCOSE 102* 114*  --   --  100* 133* 148* 103*  BUN 107* 99*  --   --  107* 72* 37* 51*  CREATININE 13.71* 13.45*  --   --  14.55* 10.90* 7.19* 8.71*  CALCIUM 8.3* 7.9*  --   --  8.2* 7.9* 8.4* 8.3*   PHOS  --   --  6.3*  --  6.7* 4.8*  --   --    CBC Recent Labs  Lab 10/22/21 1432 10/24/21 1738 10/24/21 2249 10/25/21 0615 10/26/21 1209 10/29/21 0033  WBC 8.8 11.0*  --  8.5 6.4 10.2  NEUTROABS 6.1 8.3*  --   --   --   --   HGB 7.4* 8.0* 7.8* 7.6* 7.3* 8.8*  HCT 23.2* 25.4* 23.0* 23.8* 23.7* 28.2*  MCV 91.7 89.8  --  90.5 92.2 92.8  PLT 186 214  --  200 195 212    Medications:     amLODipine  10 mg Oral Daily   aspirin EC  81 mg Oral QHS   carvedilol  12.5 mg Oral BID WC   Chlorhexidine Gluconate Cloth  6 each Topical Q0600   cloNIDine  0.2 mg Oral TID   darbepoetin (ARANESP) injection - DIALYSIS  40 mcg Intravenous Q Tue-HD   heparin injection (subcutaneous)  5,000 Units Subcutaneous Q8H   hydrALAZINE  100 mg Oral TID   multivitamin  1 tablet Oral QHS   sevelamer  carbonate  1,600 mg Oral TID WC   sodium bicarbonate  1,950 mg Oral BID   sodium chloride flush  3 mL Intravenous Q12H      Gean Quint, MD [facility] 10/29/2021, 9:04 AM

## 2021-10-29 NOTE — TOC Transition Note (Signed)
Transition of Care [facility]) - CM/SW Discharge Note   Patient Details  Name: [name] MRN: [id number] Date of Birth: 1960/12/30  Transition [facility] Care [facility]) CM/SW Contact:  Zenon Mayo, RN Phone Number: 10/29/2021, 4:13 PM   Clinical Narrative:    Per MD she is for possible dc today, Renal Navigaotor has clipped patient and given patient the details regarding her HD and the times she is supposed to be there.  Patient Mom will transport her home at dc. She has follow up apt for patient care center for hospital follow up and then she has new patient establishment at the Primary Care at [facility] where she requested because it is closer to her house. She has no other needs at dc.          Patient Goals and CMS Choice        Discharge Placement                       Discharge Plan and Services                                     Social Determinants of Health (SDOH) Interventions Utilities Interventions: Inpatient TOC, Intervention Not Indicated   Readmission Risk Interventions     No data to display

## 2021-10-30 ENCOUNTER — Encounter (HOSPITAL_COMMUNITY): Payer: Self-pay

## 2021-10-30 ENCOUNTER — Encounter (HOSPITAL_COMMUNITY): Admission: RE | Payer: Self-pay | Source: Home / Self Care

## 2021-10-30 ENCOUNTER — Other Ambulatory Visit (HOSPITAL_COMMUNITY): Payer: Self-pay

## 2021-10-30 ENCOUNTER — Ambulatory Visit (HOSPITAL_COMMUNITY)
Admission: RE | Admit: 2021-10-30 | Payer: BC Managed Care – PPO | Source: Home / Self Care | Admitting: Vascular Surgery

## 2021-10-30 DIAGNOSIS — N186 End stage renal disease: Secondary | ICD-10-CM

## 2021-10-30 DIAGNOSIS — N185 Chronic kidney disease, stage 5: Secondary | ICD-10-CM | POA: Diagnosis not present

## 2021-10-30 DIAGNOSIS — I160 Hypertensive urgency: Secondary | ICD-10-CM | POA: Diagnosis not present

## 2021-10-30 HISTORY — DX: Cardiac arrhythmia, unspecified: I49.9

## 2021-10-30 LAB — PHOSPHORUS: Phosphorus: 5.3 mg/dL — ABNORMAL HIGH (ref 2.5–4.6)

## 2021-10-30 SURGERY — LAPAROSCOPIC INSERTION CONTINUOUS AMBULATORY PERITONEAL DIALYSIS  (CAPD) CATHETER
Anesthesia: General

## 2021-10-30 MED ORDER — CARVEDILOL 12.5 MG PO TABS
12.5000 mg | ORAL_TABLET | Freq: Two times a day (BID) | ORAL | 0 refills | Status: DC
  Filled 2021-10-30: qty 60, 30d supply, fill #0

## 2021-10-30 MED ORDER — AMLODIPINE BESYLATE 10 MG PO TABS
10.0000 mg | ORAL_TABLET | Freq: Every day | ORAL | 0 refills | Status: AC
  Filled 2021-10-30: qty 30, 30d supply, fill #0

## 2021-10-30 MED ORDER — LIDOCAINE-PRILOCAINE 2.5-2.5 % EX CREA
1.0000 | TOPICAL_CREAM | CUTANEOUS | 0 refills | Status: DC | PRN
  Filled 2021-10-30: qty 30, 28d supply, fill #0

## 2021-10-30 MED ORDER — HYDRALAZINE HCL 100 MG PO TABS
100.0000 mg | ORAL_TABLET | Freq: Three times a day (TID) | ORAL | 0 refills | Status: DC
  Filled 2021-10-30: qty 90, 30d supply, fill #0

## 2021-10-30 NOTE — Progress Notes (Signed)
Pt to d/c to home today. Met with pt at bedside to provide out-pt HD schedule letter. Discussed arrangements again with pt as well. Appts placed on pt's AVS. Pt agreeable to plan and aware that she needs to start at clinic on Monday. Pt voices interest in TTS first shift appt when an appt becomes available. Spoke to [place] at [place] to make clinic aware of pt's d/c today and that pt will start on Monday. Clinic also advised that pt would prefer a TTS first shift appt when one becomes available due to pt's work schedule. Contacted renal NP regarding clinic's need for orders.   Melven Sartorius Renal Navigator [phone]

## 2021-10-30 NOTE — Progress Notes (Signed)
   10/30/21 0900  Mobility  Activity Ambulated independently in hallway  Level of Assistance Independent  Assistive Device None  Distance Ambulated (ft) 230 ft  Activity Response Tolerated well  Mobility Referral Yes  $Mobility charge 1 Mobility   Mobility Specialist Progress Note  Received pt in bed having no complaints and agreeable to mobility. Pt was asymptomatic throughout ambulation and returned to room w/o fault. Left in bed w/ call bell in reach and all needs met.   [name] Mobility Specialist

## 2021-10-30 NOTE — Discharge Summary (Signed)
Physician Discharge Summary   Patient: [name] MRN: [id number] DOB: [birth year]  Admit date:     10/24/2021  Discharge date: 10/30/21  Discharge Physician: Marylu Lund   PCP: Patient, No Pcp Per   Recommendations at discharge:    Follow up with PCP in 1-2 weeks Follow up with Nephrology as scheduled Follow up with scheduled HD sessions  Discharge Diagnoses: Principal Problem:   Hypertensive urgency Active Problems:   CAD (coronary artery disease)   Essential hypertension   DM (diabetes mellitus), type 2 with renal complications ([place])   CKD (chronic kidney disease), stage V ([place])  Resolved Problems:   * No resolved hospital problems. *  Hospital Course: 61 y.o.  female CKD stage V, HTN, normocytic anemia, nonobstructive CAD, HFpEF, -who presented with worsening headaches-found to have uncontrolled hypertension in the setting of probable progression to ESRD and subsequently admitted to the hospital service  Assessment and Plan: Hypertensive urgency BP now much improved Continue clonidine/amlodipine/Coreg/hydralazine.  Pt continued on HD, now has outpt HD chair arranged.    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 Pt to continue MWF HD as scheduled   Normocytic anemia In the setting of ESRD-Per patient-she was on IV iron infusion in the outpatient setting Defer iron/EPO to nephrology service   Chronic HFpEF Volume status stable-volume removal with HD   Nonobstructive CAD by [facility] 2010, 2013, 2014 Currently no anginal symptoms Continue ASA   HLD Per prior notes-intolerant to statin therapy.   Obesity: Estimated body mass index is 30.86 kg/m as calculated from the following:   Height as of this encounter: '5\' 4"'$  (1.626 m).   Weight as of this encounter: 81.6 kg.       Consultants: Nephrology Procedures performed:   Disposition: Home Diet recommendation:  Renal diet DISCHARGE MEDICATION: Allergies as of 10/30/2021        Reactions   Doxazosin Mesylate    Increased heart rate   Rosuvastatin Other (See Comments)   myalgia   Simvastatin    myalgias   Wellbutrin [bupropion Hcl] Other (See Comments)   Suicidal thoughts and increased depression   Zetia [ezetimibe]    myalgias        Medication List     TAKE these medications    amLODipine 10 MG tablet Commonly known as: NORVASC Take 1 tablet (10 mg total) by mouth daily.   aspirin 81 MG tablet Take 81 mg by mouth at bedtime.   carvedilol 12.5 MG tablet Commonly known as: COREG Take 1 tablet (12.5 mg total) by mouth 2 (two) times daily with a meal. What changed:  medication strength See the new instructions.   cloNIDine 0.2 MG tablet Commonly known as: CATAPRES Take 1 tablet (0.2 mg total) by mouth 2 (two) times daily. Appointment needed   [facility] Iron 325 (65 FE) MG tablet Generic drug: ferrous sulfate Take 325 mg by mouth 2 (two) times daily with a meal.   hydrALAZINE 100 MG tablet Commonly known as: APRESOLINE Take 1 tablet (100 mg total) by mouth 3 (three) times daily. What changed:  medication strength how much to take   [place] Calcium 500 MG Tabs Take 500 mg by mouth 2 (two) times daily.   sevelamer carbonate 800 MG tablet Commonly known as: RENVELA Take 1,600 mg by mouth 3 (three) times daily with meals.   sodium bicarbonate 650 MG tablet Take 1,950 mg by mouth 2 (two) times daily.   Vitamin D3 50 MCG (  2000 UT) Tabs Take 2,000 Units by mouth daily.        Follow-up [place]. Go on 11/02/2021.   Specialty: Internal Medicine Why: '@9'$ :20am Contact information: [address] [place] [phone]        Primary Care at [facility]. Go on 01/20/2022.   Specialty: Family Medicine Why: '@9'$ :00am establish PCP Contact information: [address], [address], [facility]. Go on 11/02/2021.   Why: Schedule is Monday/Wednesday/Friday with 6:00 am chair time.  Please arrive at 5:45 am. Contact information: [place] [address] [phone]                Discharge Exam: Danley Danker Weights   10/29/21 1456 10/29/21 1858 10/30/21 0508  Weight: 82.2 kg 80.5 kg 80.9 kg   General exam: Awake, laying in bed, in nad Respiratory system: Normal respiratory effort, no wheezing Cardiovascular system: regular rate, s1, s2 Gastrointestinal system: Soft, nondistended, positive BS Central nervous system: CN2-12 grossly intact, strength intact Extremities: Perfused, no clubbing Skin: Normal skin turgor, no notable skin lesions seen Psychiatry: Mood normal // no visual hallucinations   Condition at discharge: fair  The results of significant diagnostics from this hospitalization (including imaging, microbiology, ancillary and laboratory) are listed below for reference.   Imaging Studies: DG Chest 2 View  Result Date: 10/24/2021 CLINICAL DATA:  Hypertension EXAM: CHEST - 2 VIEW COMPARISON:  Chest x-ray 10/22/2021 FINDINGS: The heart is enlarged, unchanged. The lungs are clear. There is no pleural effusion or pneumothorax. No acute fractures are seen. There is a stable calcified granuloma in the right upper lobe. IMPRESSION: 1. No active cardiopulmonary disease. 2. Stable cardiomegaly. Electronically Signed   By: Ronney Asters M.D.   On: 10/24/2021 17:43   CT Head Wo Contrast  Result Date: 10/24/2021 CLINICAL DATA:  Headache, hypertension EXAM: CT HEAD WITHOUT CONTRAST TECHNIQUE: Contiguous axial images were obtained from the base of the skull through the vertex without intravenous contrast. RADIATION DOSE REDUCTION: This exam was performed according to the departmental dose-optimization program which includes automated exposure control, adjustment of the mA and/or kV according to patient size and/or use of iterative reconstruction  technique. COMPARISON:  None Available. FINDINGS: Brain: No evidence of acute infarction, hemorrhage, mass, mass effect, or midline shift. No hydrocephalus or extra-axial fluid collection. Vascular: No hyperdense vessel. Skull: Normal. Negative for fracture or focal lesion. Sinuses/Orbits: No acute finding. Other: The mastoid air cells are well aerated. IMPRESSION: No acute intracranial process. Electronically Signed   By: Merilyn Baba M.D.   On: 10/24/2021 17:30   DG Chest Port 1 View  Result Date: 10/22/2021 CLINICAL DATA:  Weakness. EXAM: PORTABLE CHEST 1 VIEW COMPARISON:  08/24/2017. FINDINGS: Heart is enlarged and the mediastinal contour is within normal limits. Atherosclerotic calcification of the aorta is noted. Interstitial prominence is present bilaterally. No consolidation, effusion, or pneumothorax. No acute osseous abnormality. IMPRESSION: 1. Mild interstitial prominence bilaterally, possible edema or infiltrate. 2. Cardiomegaly. Electronically Signed   By: Brett Fairy M.D.   On: 10/22/2021 20:12    Microbiology: Results for orders placed or performed during the hospital encounter of 08/10/20  Urine Culture     Status: Abnormal   Collection Time: 08/10/20  3:03 PM   Specimen: Urine, Clean Catch  Result Value Ref Range Status   Specimen Description URINE, CLEAN CATCH  Final   Special Requests   Final    NONE Performed at [facility], [place] [address] [place], [place] [address]    Culture MULTIPLE SPECIES PRESENT, SUGGEST RECOLLECTION (A)  Final   Report Status 08/12/2020 FINAL  Final    Labs: CBC: Recent Labs  Lab 10/24/21 1738 10/24/21 2249 10/25/21 0615 10/26/21 1209 10/29/21 0033  WBC 11.0*  --  8.5 6.4 10.2  NEUTROABS 8.3*  --   --   --   --   HGB 8.0* 7.8* 7.6* 7.3* 8.8*  HCT 25.4* 23.0* 23.8* 23.7* 28.2*  MCV 89.8  --  90.5 92.2 92.8  PLT 214  --  200 195 992   Basic Metabolic Panel: Recent Labs  Lab 10/24/21 1738 10/24/21 2239 10/24/21 2249  10/25/21 0615 10/26/21 1209 10/28/21 1000 10/29/21 0033  NA 137  --  140 138 138 137 136  K 3.6  --  3.4* 3.7 3.5 3.4* 4.1  CL 101  --   --  102 98 95* 90*  CO2 22  --   --  '22 27 29 29  '$ GLUCOSE 114*  --   --  100* 133* 148* 103*  BUN 99*  --   --  107* 72* 37* 51*  CREATININE 13.45*  --   --  14.55* 10.90* 7.19* 8.71*  CALCIUM 7.9*  --   --  8.2* 7.9* 8.4* 8.3*  MG  --  2.3  --  2.3  --  1.8  --   PHOS  --  6.3*  --  6.7* 4.8*  --  5.3*   Liver Function Tests: Recent Labs  Lab 10/24/21 1738 10/24/21 2239 10/25/21 0615 10/26/21 1209 10/28/21 1000 10/29/21 0033  AST 15 12* 13*  --  22 19  ALT '16 14 13  '$ --  19 18  ALKPHOS 35* 31* 28*  --  32* 32*  BILITOT 0.4 0.5 0.6  --  0.2* 0.3  PROT 7.2 5.6* 6.1*  --  6.4* 6.4*  ALBUMIN 3.6 3.2* 3.2* 3.0* 3.3* 3.2*   CBG: Recent Labs  Lab 10/24/21 2335 10/25/21 0427 10/25/21 0818 10/25/21 1240  GLUCAP 108* 92 105* 104*    Discharge time spent: less than 30 minutes.  Signed: Marylu Lund, MD [facility] 10/30/2021

## 2021-10-30 NOTE — Progress Notes (Signed)
[facility] Progress Note    Assessment/ Plan:   ESRD - h/o CKD5, p/w uremic symptoms of progressive fatigue and loss of appetite.L arm AVF created in early 2022.  CLIP for outpatient HD placement. Outpatient PD plan currently on hold given home visit, plan for starting at [facility] on Monday.  I do not think the patient needs dialysis today.  She still makes some urine and her labs have been reassuring.  She can start on Monday outpatient Uncont HTN/volume -now improved on current medications.  Ultrafiltration as tolerated Anemia esrd - Hb 7- 8s here, ESA started 10/17.  Hemoglobin 8.8 MBD ckd - taking binder, cont here. CCa in range, PO4 acceptable 10/16 COPD-per primary service    Subjective:   Had hemodialysis yesterday without significant problems.  Possible discharge last night but this fell through.  Plan for discharge today and start dialysis on Monday outpatient   Objective:   BP (!) 146/57 (BP Location: Right Arm)   Pulse 65   Temp 97.9 F (36.6 C) (Oral)   Resp 18   Ht '5\' 4"'$  (1.626 m)   Wt 80.9 kg   LMP 12/14/2013   SpO2 98%   BMI 30.61 kg/m   Intake/Output Summary (Last 24 hours) at 10/30/2021 1004 Last data filed at 10/30/2021 0800 Gross per 24 hour  Intake 420 ml  Output 1925 ml  Net -1505 ml   Weight change: -0.9 kg  Physical Exam: Gen: NAD, lying in bed CVS: Normal rate, no rub Resp: normal WOB, bilateral chest rise Abd: soft, nt/nd Ext: no sig edema, warm and well-perfused Neuro: awake, alert Dialysis access: LUE AVF +b/t  Imaging: No results found.  Labs: BMET Recent Labs  Lab 10/24/21 1738 10/24/21 2239 10/24/21 2249 10/25/21 0615 10/26/21 1209 10/28/21 1000 10/29/21 0033  NA 137  --  140 138 138 137 136  K 3.6  --  3.4* 3.7 3.5 3.4* 4.1  CL 101  --   --  102 98 95* 90*  CO2 22  --   --  '22 27 29 29  '$ GLUCOSE 114*  --   --  100* 133* 148* 103*  BUN 99*  --   --  107* 72* 37* 51*  CREATININE 13.45*  --   --   14.55* 10.90* 7.19* 8.71*  CALCIUM 7.9*  --   --  8.2* 7.9* 8.4* 8.3*  PHOS  --  6.3*  --  6.7* 4.8*  --   --    CBC Recent Labs  Lab 10/24/21 1738 10/24/21 2249 10/25/21 0615 10/26/21 1209 10/29/21 0033  WBC 11.0*  --  8.5 6.4 10.2  NEUTROABS 8.3*  --   --   --   --   HGB 8.0* 7.8* 7.6* 7.3* 8.8*  HCT 25.4* 23.0* 23.8* 23.7* 28.2*  MCV 89.8  --  90.5 92.2 92.8  PLT 214  --  200 195 212    Medications:     amLODipine  10 mg Oral Daily   aspirin EC  81 mg Oral QHS   carvedilol  12.5 mg Oral BID WC   Chlorhexidine Gluconate Cloth  6 each Topical Q0600   cloNIDine  0.2 mg Oral TID   darbepoetin (ARANESP) injection - DIALYSIS  40 mcg Intravenous Q Tue-HD   heparin injection (subcutaneous)  5,000 Units Subcutaneous Q8H   hydrALAZINE  100 mg Oral TID   multivitamin  1 tablet Oral QHS   sevelamer carbonate  1,600 mg Oral TID WC  sodium bicarbonate  1,950 mg Oral BID   sodium chloride flush  3 mL Intravenous Q12H       10/30/2021, 10:04 AM

## 2021-10-31 DIAGNOSIS — R0600 Dyspnea, unspecified: Secondary | ICD-10-CM | POA: Insufficient documentation

## 2021-10-31 DIAGNOSIS — D509 Iron deficiency anemia, unspecified: Secondary | ICD-10-CM | POA: Insufficient documentation

## 2021-10-31 DIAGNOSIS — R197 Diarrhea, unspecified: Secondary | ICD-10-CM | POA: Insufficient documentation

## 2021-10-31 DIAGNOSIS — R52 Pain, unspecified: Secondary | ICD-10-CM | POA: Insufficient documentation

## 2021-10-31 DIAGNOSIS — T7840XA Allergy, unspecified, initial encounter: Secondary | ICD-10-CM | POA: Insufficient documentation

## 2021-10-31 DIAGNOSIS — T782XXA Anaphylactic shock, unspecified, initial encounter: Secondary | ICD-10-CM | POA: Insufficient documentation

## 2021-10-31 DIAGNOSIS — D689 Coagulation defect, unspecified: Secondary | ICD-10-CM | POA: Insufficient documentation

## 2021-10-31 DIAGNOSIS — N2581 Secondary hyperparathyroidism of renal origin: Secondary | ICD-10-CM | POA: Insufficient documentation

## 2021-10-31 DIAGNOSIS — Z111 Encounter for screening for respiratory tuberculosis: Secondary | ICD-10-CM | POA: Insufficient documentation

## 2021-10-31 DIAGNOSIS — L299 Pruritus, unspecified: Secondary | ICD-10-CM | POA: Insufficient documentation

## 2021-10-31 DIAGNOSIS — N186 End stage renal disease: Secondary | ICD-10-CM | POA: Insufficient documentation

## 2021-10-31 NOTE — TOC Transition Note (Signed)
Transition of care contact from inpatient facility  Date of discharge: 10/30/21 Date of contact: 10/31/21 Method: Phone Spoke to: Patient  Patient contacted to discuss transition of care from recent inpatient hospitalization. Patient was admitted to [facility] from 10/24/21-10/30/21 with discharge diagnosis of hypertensive urgency and CKD V progressed to ESRD.  Medication changes were reviewed. She reports mild lightheadedness this morning. Noted she is 4 BP agents for uncontrolled HTN. Advised to hold BP meds on the mornings before HD and to ensure she takes after treatment for now. Plan for renal team to see patient during routine rounds where we will re-assess how she is feeling on HD, review her BP trend, and make further adjustments to her BP regimen if indicated.   Patient will follow up with her outpatient HD unit on: Monday 11/02/21 at [facility].  Tobie Poet, NP

## 2021-11-02 ENCOUNTER — Inpatient Hospital Stay: Payer: Self-pay | Admitting: Nurse Practitioner

## 2021-11-02 DIAGNOSIS — N2581 Secondary hyperparathyroidism of renal origin: Secondary | ICD-10-CM | POA: Diagnosis not present

## 2021-11-02 DIAGNOSIS — N186 End stage renal disease: Secondary | ICD-10-CM | POA: Diagnosis not present

## 2021-11-02 DIAGNOSIS — Z992 Dependence on renal dialysis: Secondary | ICD-10-CM | POA: Diagnosis not present

## 2021-11-04 DIAGNOSIS — N186 End stage renal disease: Secondary | ICD-10-CM | POA: Diagnosis not present

## 2021-11-04 DIAGNOSIS — Z992 Dependence on renal dialysis: Secondary | ICD-10-CM | POA: Diagnosis not present

## 2021-11-04 DIAGNOSIS — I120 Hypertensive chronic kidney disease with stage 5 chronic kidney disease or end stage renal disease: Secondary | ICD-10-CM | POA: Diagnosis not present

## 2021-11-04 DIAGNOSIS — N2581 Secondary hyperparathyroidism of renal origin: Secondary | ICD-10-CM | POA: Diagnosis not present

## 2021-11-05 ENCOUNTER — Inpatient Hospital Stay (HOSPITAL_COMMUNITY): Admission: RE | Admit: 2021-11-05 | Payer: BC Managed Care – PPO | Source: Ambulatory Visit

## 2021-11-06 ENCOUNTER — Encounter: Payer: Self-pay | Admitting: General Practice

## 2021-11-06 ENCOUNTER — Ambulatory Visit: Payer: BC Managed Care – PPO | Attending: General Practice | Admitting: General Practice

## 2021-11-06 VITALS — BP 124/66 | HR 78 | Ht 64.0 in | Wt 182.6 lb

## 2021-11-06 DIAGNOSIS — Z992 Dependence on renal dialysis: Secondary | ICD-10-CM | POA: Diagnosis not present

## 2021-11-06 DIAGNOSIS — N186 End stage renal disease: Secondary | ICD-10-CM | POA: Diagnosis not present

## 2021-11-06 DIAGNOSIS — E785 Hyperlipidemia, unspecified: Secondary | ICD-10-CM | POA: Diagnosis not present

## 2021-11-06 DIAGNOSIS — I10 Essential (primary) hypertension: Secondary | ICD-10-CM | POA: Diagnosis not present

## 2021-11-06 DIAGNOSIS — N2581 Secondary hyperparathyroidism of renal origin: Secondary | ICD-10-CM | POA: Diagnosis not present

## 2021-11-06 DIAGNOSIS — I2510 Atherosclerotic heart disease of native coronary artery without angina pectoris: Secondary | ICD-10-CM | POA: Diagnosis not present

## 2021-11-06 NOTE — Patient Instructions (Signed)
Medication Instructions:  The current medical regimen is effective;  continue present plan and medications as directed. Please refer to the Current Medication list given to you today.  *If you need a refill on your cardiac medications before your next appointment, please call your pharmacy*   Lab Work: NONE  Follow-Up: At [facility], you and your health needs are our priority.  As part of our continuing mission to provide you with exceptional heart care, we have created designated Provider Care Teams.  These Care Teams include your primary Cardiologist (physician) and Advanced Practice Providers (APPs -  Physician Assistants and Nurse Practitioners) who all work together to provide you with the care you need, when you need it.  Your next appointment:   6 month(s)  The format for your next appointment:   In Person  Provider:   Quay Burow, MD  or Coletta Memos, FNP        Important Information About Sugar

## 2021-11-06 NOTE — Progress Notes (Signed)
Cardiology Clinic Note   Patient Name: [name] Date of Encounter: 11/06/2021  Primary Care Provider:  Patient, No Pcp Per Primary Cardiologist:  Quay Burow, MD  Patient Profile    [name] 61 year old female presents the clinic today for follow-up evaluation of her coronary artery disease and hypertension.  Past Medical History    Past Medical History:  Diagnosis Date   Angina    CAD (coronary artery disease), non obstructive CAD in 2010    a. 2013 Cath: LAD 60, RCA 28m 70d; b. 2014 Cath: stable anatomy.   Chronic kidney disease    Stage 5; polycystic kidney disease , elevated creatine   [place]D [place] disease) [place]    [place]    a. 12/2014 HbA1c 6.5; b. Rx Glipizide - not taking.   Dysrhythmia    Family history of premature CAD    GERD (gastroesophageal reflux disease)    Hx of adenomatous colonic polyps 06/11/2016   Hyperlipidemia    Hypertension    Hypertensive heart disease    Palpitations    Reflux    Sleep apnea    mild, no c-pap at this point   Tobacco abuse    Past Surgical History:  Procedure Laterality Date   APPENDECTOMY  1974   AV FISTULA PLACEMENT Left 02/19/2020   Procedure: LEFT ARM BRACHIOCEPHALIC  ARTERIOVENOUS (AV) FISTULA CREATION;  Surgeon: CWaynetta Sandy MD;  [facility]  Service: Vascular;  Laterality: Left;   CARDIAC CATHETERIZATION  02/07/2008   Recommendation-F/U stress test   CARDIAC CATHETERIZATION  04/16/2011   Recommendation-Increase medical therapy trial   CARDIOVASCULAR STRESS TEST  03/25/2011   Suspect subtle anterior septal ischemia   COLONOSCOPY     FISTULA SUPERFICIALIZATION Left 05/13/2020   Procedure: FISTULA SUPERFICIALIZATION LEFT;  Surgeon: CWaynetta Sandy MD;  [place] [facility]  Service: Vascular;  Laterality: Left;   FRACTURE SURGERY Left 1972   forearm fracture   LEFT HEART CATHETERIZATION WITH CORONARY ANGIOGRAM  N/A 04/16/2011   Procedure: LEFT HEART CATHETERIZATION WITH CORONARY ANGIOGRAM;  Surgeon: TTroy Sine MD;  [facility];  Service: Cardiovascular;  Laterality: N/A;   LEFT HEART CATHETERIZATION WITH CORONARY ANGIOGRAM N/A 09/01/2012   Procedure: LEFT HEART CATHETERIZATION WITH CORONARY ANGIOGRAM;  Surgeon: KPixie Casino MD;  [facility];  Service: Cardiovascular;  Laterality: N/A;   POLYPECTOMY     RENAL DOPPLER  06/05/2009   No evidence of significant diameter reduction   TRANSTHORACIC ECHOCARDIOGRAM  01/25/2008   EF 60-65%, Moderate concentric LVH   WRIST FRACTURE SURGERY Right ~ 1996    Allergies  Allergies  Allergen Reactions   Doxazosin Mesylate     Increased heart rate   Rosuvastatin Other (See Comments)    myalgia   Simvastatin     myalgias   Wellbutrin [Bupropion Hcl] Other (See Comments)    Suicidal thoughts and increased depression   Zetia [Ezetimibe]     myalgias    History of Present Illness    Madison TEMPLINhas a PMH of CAD, HTN, HLD, acute on chronic diastolic CHF, type 2 diabetes, chronic renal insufficiency stage IV, tobacco abuse, abnormal Lexiscan 3/13 showing anterior ischemia, edema, and truncal obesity.  She underwent cardiac catheterization in 2010, 4/13 and again in 2014.  On her repeat cath in 2013 she was noted to have 60% LAD stenosis 50% mid RCA, 70% distal RCA, and normal LV function.  She  was seen as an outpatient.  She underwent cardiac catheterization 8/14 which showed unchanged anatomy from her previous cardiac catheterization.  At that time she was noted to have 60% RCA lesion and other scattered noncritical disease with normal LV function.  Medical management was recommended.  She was seen in follow-up by Dr. Gwenlyn Found on 05/07/2021.  She continued to do well at that time.  Her creatinine was in the 7 range.  She had an AV fistula placed in anticipation for HD by Dr. Donzetta Matters.  She denied chest pain and shortness of breath.  Her blood  pressure was well controlled at home in the 130/80 range.  She presented to the emergency department on 10/25/2021 and was discharged on 10/30/2021.  She reported worsening headaches and was found to have uncontrolled hypertension in the setting of progression of end-stage renal disease.  She was admitted to hospital service.  She was continued on clonidine, amiodarone, carvedilol, and hydralazine.  She continued hemodialysis.  Her blood pressure significantly improved.  She presents to the clinic today for follow-up evaluation states that she feels well today.  She reports compliance with hemodialysis Monday Wednesday Friday.  She wishes to switch to Tuesday Thursday Saturday.  She feels it will work better with her work schedule.  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  Her blood pressures been well controlled at home.  On hemodialysis days she notices blood pressures in the 110/60 range.  Initially today her blood pressure is 160/72 and on recheck is 122/66.  Nephrology has been titrating her blood pressure medications.  We reviewed her recent hospitalization and she expressed understanding.  We reviewed the importance of heart healthy low-sodium diet and increasing her physical activity.  We will plan follow-up for 6 months.  Today she denies chest pain, shortness of breath, lower extremity edema, fatigue, palpitations, melena, hematuria, hemoptysis, diaphoresis, weakness, presyncope, syncope, orthopnea, and PND.     Home Medications    Prior to Admission medications   Medication Sig Start Date End Date Taking? Authorizing Provider  amLODipine (NORVASC) 10 MG tablet Take 1 tablet (10 mg total) by mouth daily. 10/30/21 11/29/21  Donne Hazel, MD  aspirin 81 MG tablet Take 81 mg by mouth at bedtime.    [provider]  carvedilol (COREG) 12.5 MG tablet Take 1 tablet (12.5 mg total) by mouth 2 (two) times daily with a meal. 10/30/21 11/29/21  Donne Hazel, MD   Cholecalciferol (VITAMIN D3) 50 MCG (2000 UT) TABS Take 2,000 Units by mouth daily.    [provider]  cloNIDine (CATAPRES) 0.2 MG tablet Take 1 tablet (0.2 mg total) by mouth 2 (two) times daily. Appointment needed 03/03/21   Lorretta Harp, MD  [facility] IRON 325 (65 Fe) MG tablet Take 325 mg by mouth 2 (two) times daily with a meal. 04/04/20   [provider]  hydrALAZINE (APRESOLINE) 100 MG tablet Take 1 tablet (100 mg total) by mouth 3 (three) times daily. 10/30/21 11/29/21  Donne Hazel, MD  lidocaine-prilocaine (EMLA) cream Apply 1 Application topically as needed (for HD). 10/30/21   Donne Hazel, MD  [facility] [place] Calcium 500 MG TABS Take 500 mg by mouth 2 (two) times daily. 04/18/19   [provider]  sevelamer carbonate (RENVELA) 800 MG tablet Take 1,600 mg by mouth 3 (three) times daily with meals. 04/24/21   [provider]  sodium bicarbonate 650 MG tablet Take 1,950 mg by mouth 2 (two) times  daily. 04/04/20   [provider]    Family History    Family History  Problem Relation Age of Onset   Stroke Father    Hypertension Father    Coronary artery disease Brother    Diabetes Mother    Stroke Maternal Grandmother    Cancer Maternal Grandmother        Breast cancer   Hypertension Maternal Grandfather    Heart attack Maternal Grandfather    Heart attack Maternal Aunt 41   Colon cancer Neg Hx    Esophageal cancer Neg Hx    Stomach cancer Neg Hx    Rectal cancer Neg Hx    She indicated that her mother is alive. She indicated that her father is deceased. She indicated that her brother is alive. She indicated that her maternal grandmother is deceased. She indicated that her maternal grandfather is deceased. She indicated that her paternal grandmother is deceased. She indicated that her paternal grandfather is deceased. She indicated that the status of her maternal aunt is unknown. She indicated that the status of her neg hx is  unknown.  Social History    Social History   Socioeconomic History   Marital status: Divorced    Spouse name: Not on file   Number of children: 1   Years of education: Not on file   Highest education level: Not on file  Occupational History   Occupation: Lobbyist: [employer]  Tobacco Use   Smoking status: Former    Packs/day: 0.25    Years: 30.00    Total pack years: 7.50    Types: Cigarettes    Quit date: 10/23/2021    Years since quitting: 0.0   Smokeless tobacco: Never   Tobacco comments:    2-  weeks  Vaping Use   Vaping Use: Never used  Substance and Sexual Activity   Alcohol use: No    Alcohol/week: 0.0 standard drinks of alcohol   Drug use: No   Sexual activity: Not Currently    Partners: Male  Other Topics Concern   Not on file  Social History Narrative   Not on file   Social Determinants of Health   Financial Resource Strain: Medium Risk (10/24/2017)   Overall Financial Resource Strain (CARDIA)    Difficulty of Paying Living Expenses: Somewhat hard  Food Insecurity: No Food Insecurity (10/25/2021)   Hunger Vital Sign    Worried About Running Out of Food in the Last Year: Never true    Ran Out of Food in the Last Year: Never true  Transportation Needs: No Transportation Needs (10/25/2021)   PRAPARE - Hydrologist (Medical): No    Lack of Transportation (Non-Medical): No  Physical Activity: Inactive (10/24/2017)   Exercise Vital Sign    Days of Exercise per Week: 0 days    Minutes of Exercise per Session: 0 min  Stress: Stress Concern Present (10/24/2017)   [place]    Feeling of Stress : To some extent  Social Connections: Somewhat Isolated (10/24/2017)   Social Connection and Isolation Panel [NHANES]    Frequency of Communication with Friends and Family: More than three times a week    Frequency of Social Gatherings with Friends and  Family: Once a week    Attends Religious Services: 1 to 4 times per year    Active Member of [employer] or Organizations: No    Attends [employer]  or Organization Meetings: Never    Marital Status: Divorced  Human resources officer Violence: Not At Risk (10/25/2021)   Humiliation, Afraid, Rape, and Kick questionnaire    Fear of Current or Ex-Partner: No    Emotionally Abused: No    Physically Abused: No    Sexually Abused: No     Review of Systems    General:  No chills, fever, night sweats or weight changes.  Cardiovascular:  No chest pain, dyspnea on exertion, edema, orthopnea, palpitations, paroxysmal nocturnal dyspnea. Dermatological: No rash, lesions/masses Respiratory: No cough, dyspnea Urologic: No hematuria, dysuria Abdominal:   No nausea, vomiting, diarrhea, bright red blood per rectum, melena, or hematemesis Neurologic:  No visual changes, wkns, changes in mental status. All other systems reviewed and are otherwise negative except as noted above.  Physical Exam    VS:  BP 124/66   Pulse 78   Ht '5\' 4"'$  (1.626 m)   Wt 182 lb 9.6 oz (82.8 kg)   LMP 12/14/2013   SpO2 95%   BMI 31.34 kg/m  , BMI Body mass index is 31.34 kg/m. GEN: Well nourished, well developed, in no acute distress. HEENT: normal. Neck: Supple, no JVD, carotid bruits, or masses. Cardiac: RRR, no murmurs, rubs, or gallops. No clubbing, cyanosis, edema.  Radials/DP/PT 2+ and equal bilaterally.  Respiratory:  Respirations regular and unlabored, clear to auscultation bilaterally. GI: Soft, nontender, nondistended, BS + x 4. MS: no deformity or atrophy. Skin: warm and dry, no rash. Neuro:  Strength and sensation are intact. Psych: Normal affect.  Accessory Clinical Findings    Recent Labs: 10/24/2021: TSH 1.844 10/28/2021: Magnesium 1.8 10/29/2021: ALT 18; BUN 51; Creatinine, Ser 8.71; Hemoglobin 8.8; Platelets 212; Potassium 4.1; Sodium 136   Recent Lipid Panel    Component Value Date/Time   CHOL 183 10/05/2018  0923   TRIG 251 (H) 10/05/2018 0923   HDL 29 (L) 10/05/2018 0923   CHOLHDL 6.3 (H) 10/05/2018 0923   CHOLHDL 7.0 (H) 04/01/2016 0847   VLDL 52 (H) 04/01/2016 0847   LDLCALC 110 (H) 10/05/2018 0923         ECG personally reviewed by me today-none today.  Echocardiogram 11/02/2019  IMPRESSIONS     1. Left ventricular ejection fraction, by estimation, is 60 to 65%. The  left ventricle has normal function. The left ventricle has no regional  wall motion abnormalities. There is moderate left ventricular hypertrophy.  Left ventricular diastolic  parameters are consistent with Grade I diastolic dysfunction (impaired  relaxation).   2. Right ventricular systolic function is normal. The right ventricular  size is normal.   3. The mitral valve is normal in structure. Trivial mitral valve  regurgitation. No evidence of mitral stenosis.   4. The aortic valve is normal in structure. Aortic valve regurgitation is  not visualized. No aortic stenosis is present.   5. The inferior vena cava is normal in size with greater than 50%  respiratory variability, suggesting right atrial pressure of 3 mmHg.  Assessment & Plan   1.  Coronary artery disease-no chest pain today.  Cardiac catheterization 2014 showed unchanged coronary anatomy with 60% RCA lesion. Continue amlodipine, aspirin, carvedilol Heart healthy low-sodium diet-salty 6 given Increase physical activity as tolerated  Essential hypertension-BP today 124/66.  Recent admission to the hospital on 10/25/2021.  Uncontrolled hypertension felt to be related to progressing ESRD.  Blood pressures been well controlled at  Hemodialysis.  She reports compliance with HD on Monday Wednesday Friday. Continue amlodipine, aspirin, carvedilol,  clonidine, hydralazine Heart healthy low-sodium diet Follows with nephrology  Hyperlipidemia-LDL110 on 9/20 Continue aspirin Heart healthy low-sodium high-fiber diet Increase physical activity as  tolerated Follow with pcp   Disposition: Follow-up with Dr.Berry or me in 6 months.   Jossie Ng. Allye Hoyos NP-C     11/06/2021, 2:48 PM [address] Office [phone] Fax [phone]  Notice: This dictation was prepared with Dragon dictation along with smaller phrase technology. Any transcriptional errors that result from this process are unintentional and may not be corrected upon review.  I spent 14 minutes examining this patient, reviewing medications, and using patient centered shared decision making involving her cardiac care.  Prior to her visit I spent greater than 20 minutes reviewing her past medical history,  medications, and prior cardiac tests.

## 2021-11-09 DIAGNOSIS — N2581 Secondary hyperparathyroidism of renal origin: Secondary | ICD-10-CM | POA: Diagnosis not present

## 2021-11-09 DIAGNOSIS — N186 End stage renal disease: Secondary | ICD-10-CM | POA: Diagnosis not present

## 2021-11-09 DIAGNOSIS — Z992 Dependence on renal dialysis: Secondary | ICD-10-CM | POA: Diagnosis not present

## 2021-11-10 ENCOUNTER — Ambulatory Visit (INDEPENDENT_AMBULATORY_CARE_PROVIDER_SITE_OTHER): Payer: BC Managed Care – PPO | Admitting: Nurse Practitioner

## 2021-11-10 ENCOUNTER — Encounter: Payer: Self-pay | Admitting: Nurse Practitioner

## 2021-11-10 VITALS — BP 139/66 | HR 76 | Temp 98.2°F | Ht 64.0 in | Wt 178.0 lb

## 2021-11-10 DIAGNOSIS — Z1231 Encounter for screening mammogram for malignant neoplasm of breast: Secondary | ICD-10-CM

## 2021-11-10 DIAGNOSIS — E1121 Type 2 diabetes mellitus with diabetic nephropathy: Secondary | ICD-10-CM

## 2021-11-10 DIAGNOSIS — N185 Chronic kidney disease, stage 5: Secondary | ICD-10-CM

## 2021-11-10 DIAGNOSIS — E1122 Type 2 diabetes mellitus with diabetic chronic kidney disease: Secondary | ICD-10-CM | POA: Diagnosis not present

## 2021-11-10 DIAGNOSIS — N186 End stage renal disease: Secondary | ICD-10-CM | POA: Diagnosis not present

## 2021-11-10 DIAGNOSIS — I2510 Atherosclerotic heart disease of native coronary artery without angina pectoris: Secondary | ICD-10-CM

## 2021-11-10 DIAGNOSIS — Z992 Dependence on renal dialysis: Secondary | ICD-10-CM | POA: Diagnosis not present

## 2021-11-10 DIAGNOSIS — I160 Hypertensive urgency: Secondary | ICD-10-CM

## 2021-11-10 DIAGNOSIS — E785 Hyperlipidemia, unspecified: Secondary | ICD-10-CM

## 2021-11-10 DIAGNOSIS — Z09 Encounter for follow-up examination after completed treatment for conditions other than malignant neoplasm: Secondary | ICD-10-CM | POA: Diagnosis not present

## 2021-11-10 DIAGNOSIS — D649 Anemia, unspecified: Secondary | ICD-10-CM

## 2021-11-10 NOTE — Assessment & Plan Note (Signed)
Currently followed by cardiology On carvedilol 12.5 mg twice daily, amlodipine 10 mg daily, hydralazine 100 mg 3 times daily Patient denies chest pain, shortness of breath Patient was encouraged to maintain close follow-up with cardiology, she verbalized understanding

## 2021-11-10 NOTE — Assessment & Plan Note (Signed)
was on admission at the hospital from October 24, 2021 to October 31, 2018 for hypertensive urgency.  Patient newly started hemodialysis.  Hemodialysis on Monday Wednesday Friday.  Has been right upper arm AV fistula, still makes urine, she is trying to get HD scheduled for Tuesday Thursday, Saturdays due to her work schedule.  Patient currently denies chest pain, dizziness, headache, edema, he has not followed up with cardiology.  She states that her blood pressure has been well controlled at home readings between low 100s to low 120s over 60s. Hospital discharge summary, labs, imaging studies reviewed by me today

## 2021-11-10 NOTE — Assessment & Plan Note (Signed)
BP Readings from Last 3 Encounters:  11/10/21 139/66  11/06/21 124/66  10/30/21 (!) 146/57  Currently on amlodipine 10 mg daily, carvedilol 12.5 mg twice daily, hydralazine 100 mg 3 times daily, she is weaning off clonidine per instructions  from nephrology to avoid BP drops Continue current medications DASH diet advised engage in regular monitoring exercises at least 150 minutes weekly as tolerated Patient encouraged to maintain close follow-up with nephrologist and obtain HD sessions regularly BP has been well controlled at home

## 2021-11-10 NOTE — Assessment & Plan Note (Signed)
She is intolerant to statin and Zetia States that she was previously on Repatha she does not know why medication was stopped We will check lipid panel at next visit and start Repatha LDL goal is less than 70 Avoid Fatty fried foods

## 2021-11-10 NOTE — Assessment & Plan Note (Signed)
Lab Results  Component Value Date   HGBA1C 5.5 10/24/2021  Currently diet controlled We will check lipid panel at next visit and restart Repatha injection Diabetic foot exam completed today Patient referred to ophthalmology for diabetic eye exam

## 2021-11-10 NOTE — Progress Notes (Signed)
New Patient Office Visit  Subjective:  Patient ID: [name], female    DOB: 01-29-1960  Age: 61 y.o. MRN: [id number]  CC:  Chief Complaint  Patient presents with   Hospitalization Follow-up    HPI [name] is a 61 y.o. female with past medical history of GAD, heart failure, CKD stage IV with progression to end-stage renal disease, normocytic anemia, nonobstructive CAD, hyperlipidemia, obesity, type 2 diabetes who presents for follow-up for hospital admission for hypertensive urgency and to establish care for her chronic medical conditions.states that her  previous PCPs office was closed down.  She was on admission at the hospital from October 24, 2021 to October 31, 2018 for hypertensive urgency.  Patient newly started hemodialysis.  Hemodialysis on Monday Wednesday Friday.  Has right upper arm AV fistula, still makes urine, she is trying to get HD scheduled for Tuesday Thursday, Saturdays due to her work schedule.  Patient currently denies chest pain, dizziness, headache, edema, she has  followed up with cardiology after her hospitalization. .  She states that her blood pressure has been well controlled at home, readings between low 100s to low 120s over 60s.   Hypertension.  Currently on amlodipine 10 mg daily, carvedilol 12.5 mg twice daily hydralazine 100 mg 3 times daily, she is weaning of clonidine as instructed by nephrology.  Patient denies shortness of breath, dizziness, edema.   Has History of type 2 diabetes currently diet controlled without medications, she is  due for diabetic eye exam referral sent today ,diabetic foot exam completed today.  Patient states that she has had 2 COVID vaccines she is due for shingles vaccine need for shingles discussed ,patient encouraged to get  the vaccines at pharmacy.   Referral sent for screening mammogram  Pap exam at next visit       Past Medical History:  Diagnosis Date   Angina    CAD (coronary artery disease),  non obstructive CAD in 2010    a. 2013 Cath: LAD 76, RCA 26m 70d; b. 2014 Cath: stable anatomy.   Chronic kidney disease    Stage 5; polycystic kidney disease , elevated creatine   COPD (chronic obstructive pulmonary disease) ([place]    Di[place]    a. 12/2014 HbA1c 6.5; b. Rx Glipizide - not taking.   Dysrhythmia    Family history of premature CAD    GERD (gastroesophageal reflux disease)    Hx of adenomatous colonic polyps 06/11/2016   Hyperlipidemia    Hypertension    Hypertensive heart disease    Palpitations    Reflux    Sleep apnea    mild, no c-pap at this point   Tobacco abuse     Past Surgical History:  Procedure Laterality Date   APPENDECTOMY  1974   AV FISTULA PLACEMENT Left 02/19/2020   Procedure: LEFT ARM BRACHIOCEPHALIC  ARTERIOVENOUS (AV) FISTULA CREATION;  Surgeon: CWaynetta Sandy MD;  [facility]  Service: Vascular;  Laterality: Left;   CARDIAC CATHETERIZATION  02/07/2008   Recommendation-F/U stress test   CARDIAC CATHETERIZATION  04/16/2011   Recommendation-Increase medical therapy trial   CARDIOVASCULAR STRESS TEST  03/25/2011   Suspect subtle anterior septal ischemia   COLONOSCOPY     FISTULA SUPERFICIALIZATION Left 05/13/2020   Procedure: FISTULA SUPERFICIALIZATION LEFT;  Surgeon: CWaynetta Sandy MD;  [facility]  Service: Vascular;  Laterality: Left;   FRACTURE SURGERY Left 1972   forearm fracture   LEFT HEART  CATHETERIZATION WITH CORONARY ANGIOGRAM N/A 04/16/2011   Procedure: LEFT HEART CATHETERIZATION WITH CORONARY ANGIOGRAM;  Surgeon: Troy Sine, MD;  Location[facility] C[facility] LAB;  Service: Cardiovascular;  Laterality: N/A;   LEFT HEART CATHETERIZATION WITH CORONARY ANGIOGRAM N/A 09/01/2012   Procedure: LEFT HEART CATHETERIZATION WITH CORONARY ANGIOGRAM;  Surgeon: Pixie Casino, MD;  Location: [facility] CATH LAB;  Service: Cardiovascular;  Laterality: N/A;   POLYPECTOMY     RENAL DOPPLER  06/05/2009    No evidence of significant diameter reduction   TRANSTHORACIC ECHOCARDIOGRAM  01/25/2008   EF 60-65%, Moderate concentric LVH   WRIST FRACTURE SURGERY Right ~ 1996    Family History  Problem Relation Age of Onset   Stroke Father    Hypertension Father    Coronary artery disease Brother    Diabetes Mother    Stroke Maternal Grandmother    Cancer Maternal Grandmother        Breast cancer   Hypertension Maternal Grandfather    Heart attack Maternal Grandfather    Heart attack Maternal Aunt 41   Colon cancer Neg Hx    Esophageal cancer Neg Hx    Stomach cancer Neg Hx    Rectal cancer Neg Hx     Social History   Socioeconomic History   Marital status: Divorced    Spouse name: Not on file   Number of children: 1   Years of education: Not on file   Highest education level: Not on file  Occupational History   Occupation: Lobbyist: [employer]  Tobacco Use   Smoking status: Former    Packs/day: 0.25    Years: 30.00    Total pack years: 7.50    Types: Cigarettes    Quit date: 10/23/2021    Years since quitting: 0.0   Smokeless tobacco: Never   Tobacco comments:    2-  weeks  Vaping Use   Vaping Use: Never used  Substance and Sexual Activity   Alcohol use: No    Alcohol/week: 0.0 standard drinks of alcohol   Drug use: No   Sexual activity: Not Currently    Partners: Male  Other Topics Concern   Not on file  Social History Narrative   Lives with her son and her aunt lives with them as well.    Social Determinants of Health   Financial Resource Strain: Medium Risk (10/24/2017)   Overall Financial Resource Strain (CARDIA)    Difficulty of Paying Living Expenses: Somewhat hard  Food Insecurity: No Food Insecurity (10/25/2021)   Hunger Vital Sign    Worried About Running Out of Food in the Last Year: Never true    Ran Out of Food in the Last Year: Never true  Transportation Needs: No Transportation Needs (10/25/2021)   PRAPARE - Armed forces logistics/support/administrative officer (Medical): No    Lack of Transportation (Non-Medical): No  Physical Activity: Inactive (10/24/2017)   Exercise Vital Sign    Days of Exercise per Week: 0 days    Minutes of Exercise per Session: 0 min  Stress: Stress Concern Present (10/24/2017)   [place]    Feeling of Stress : To some extent  Social Connections: Somewhat Isolated (10/24/2017)   Social Connection and Isolation Panel [NHANES]    Frequency of Communication with Friends and Family: More than three times a week    Frequency of Social Gatherings with Friends and Family: Once a week  Attends Religious Services: 1 to 4 times per year    Active Member of Clubs or Organizations: No    Attends Archivist Meetings: Never    Marital Status: Divorced  Human resources officer Violence: Not At Risk (10/25/2021)   Humiliation, Afraid, Rape, and Kick questionnaire    Fear of Current or Ex-Partner: No    Emotionally Abused: No    Physically Abused: No    Sexually Abused: No    ROS Review of Systems  Constitutional:  Negative for activity change, appetite change, chills, diaphoresis, fatigue and fever.  Respiratory: Negative.  Negative for apnea, cough, chest tightness, shortness of breath, wheezing and stridor.   Cardiovascular: Negative.  Negative for chest pain, palpitations and leg swelling.  Gastrointestinal: Negative.  Negative for abdominal distention, abdominal pain and constipation.  Endocrine: Negative.   Genitourinary:  Negative for decreased urine volume, difficulty urinating and dysuria.  Musculoskeletal: Negative.  Negative for arthralgias and joint swelling.  Neurological: Negative.  Negative for dizziness, facial asymmetry, light-headedness, numbness and headaches.  Psychiatric/Behavioral: Negative.  Negative for agitation, behavioral problems, confusion, decreased concentration and dysphoric mood.     Objective:    Today's Vitals: BP 139/66   Pulse 76   Temp 98.2 F (36.8 C) (Oral)   Ht '5\' 4"'$  (1.626 m)   Wt 178 lb (80.7 kg)   LMP 12/14/2013   SpO2 98%   BMI 30.55 kg/m   Physical Exam Constitutional:      General: She is not in acute distress.    Appearance: She is obese. She is not ill-appearing, toxic-appearing or diaphoretic.  HENT:     Mouth/Throat:     Mouth: Mucous membranes are moist.     Pharynx: Oropharynx is clear.  Eyes:     General: No scleral icterus.       Right eye: No discharge.        Left eye: No discharge.     Extraocular Movements: Extraocular movements intact.     Pupils: Pupils are equal, round, and reactive to light.  Cardiovascular:     Rate and Rhythm: Normal rate and regular rhythm.     Heart sounds: No murmur heard.    No friction rub. No gallop.  Pulmonary:     Effort: Pulmonary effort is normal. No respiratory distress.     Breath sounds: Normal breath sounds. No stridor. No wheezing, rhonchi or rales.  Chest:     Chest wall: No tenderness.  Abdominal:     General: There is no distension.     Palpations: Abdomen is soft.     Tenderness: There is no abdominal tenderness. There is no guarding.  Musculoskeletal:        General: No swelling, tenderness or deformity.     Right lower leg: No edema.     Left lower leg: No edema.  Skin:    Capillary Refill: Capillary refill takes less than 2 seconds.     Comments: Left upper arm hemodialysis AV fistula, present thrill and bruit,mild ecchymosis noted ,no tenderness or swelling noted.  Neurological:     Mental Status: She is alert.     Cranial Nerves: No cranial nerve deficit.     Sensory: No sensory deficit.     Motor: No weakness.     Coordination: Coordination normal.     Gait: Gait normal.  Psychiatric:        Mood and Affect: Mood normal.        Behavior: Behavior normal.  Thought Content: Thought content normal.        Judgment: Judgment normal.     Assessment & Plan:   Problem List  Items Addressed This Visit       Cardiovascular and Mediastinum   CAD (coronary artery disease) (Chronic)    Currently followed by cardiology On carvedilol 12.5 mg twice daily, amlodipine 10 mg daily, hydralazine 100 mg 3 times daily Patient denies chest pain, shortness of breath Patient was encouraged to maintain close follow-up with cardiology, she verbalized understanding       Hypertensive urgency - Primary    BP Readings from Last 3 Encounters:  11/10/21 139/66  11/06/21 124/66  10/30/21 (!) 146/57  Currently on amlodipine 10 mg daily, carvedilol 12.5 mg twice daily, hydralazine 100 mg 3 times daily, she is weaning off clonidine per instructions  from nephrology to avoid BP drops Continue current medications DASH diet advised engage in regular monitoring exercises at least 150 minutes weekly as tolerated Patient encouraged to maintain close follow-up with nephrologist and obtain HD sessions regularly BP has been well controlled at home         Endocrine   DM (diabetes mellitus), type 2 with renal complications ([place])    Lab Results  Component Value Date   HGBA1C 5.5 10/24/2021  Currently diet controlled We will check lipid panel at next visit and restart Repatha injection Diabetic foot exam completed today Patient referred to ophthalmology for diabetic eye exam       Relevant Orders   Ambulatory referral to Ophthalmology     Genitourinary   CKD (chronic kidney disease), stage V ([place])    Currently on hemodialysis Monday Wednesdays Friday Patient encouraged to continue to attend hemodialysis sessions DASH diet advised        Other   Dyslipidemia- LDL goal < 70 (Chronic)    She is intolerant to statin and Zetia States that she was previously on Repatha she does not know why medication was stopped We will check lipid panel at next visit and start Repatha LDL goal is less than 70 Avoid Fatty fried foods      Normocytic anemia    Lab Results  Component Value  Date   WBC 10.2 10/29/2021   HGB 8.8 (L) 10/29/2021   HCT 28.2 (L) 10/29/2021   MCV 92.8 10/29/2021   PLT 212 10/29/2021  due to ESRD, will defer management to nephrology service       Hospital discharge follow-up    was on admission at the hospital from October 24, 2021 to October 31, 2018 for hypertensive urgency.  Patient newly started hemodialysis.  Hemodialysis on Monday Wednesday Friday.  Has been right upper arm AV fistula, still makes urine, she is trying to get HD scheduled for Tuesday Thursday, Saturdays due to her work schedule.  Patient currently denies chest pain, dizziness, headache, edema, he has not followed up with cardiology.  She states that her blood pressure has been well controlled at home readings between low 100s to low 120s over 60s. Hospital discharge summary, labs, imaging studies reviewed by me today      Other Visit Diagnoses     Encounter for screening mammogram for malignant neoplasm of breast       Relevant Orders   MM 3D SCREEN BREAST BILATERAL       Outpatient Encounter Medications as of 11/10/2021  Medication Sig   amLODipine (NORVASC) 10 MG tablet Take 1 tablet (10 mg total) by mouth daily.   carvedilol (  COREG) 12.5 MG tablet Take 1 tablet (12.5 mg total) by mouth 2 (two) times daily with a meal.   hydrALAZINE (APRESOLINE) 100 MG tablet Take 1 tablet (100 mg total) by mouth 3 (three) times daily.   lidocaine-prilocaine (EMLA) cream Apply 1 Application topically as needed (for HD).   sevelamer carbonate (RENVELA) 800 MG tablet Take 1,600 mg by mouth 3 (three) times daily with meals.   cloNIDine (CATAPRES) 0.2 MG tablet Take 1 tablet (0.2 mg total) by mouth 2 (two) times daily. Appointment needed (Patient not taking: Reported on 11/10/2021)   No facility-administered encounter medications on file as of 11/10/2021.    Follow-up: Return in about 2 months (around 01/10/2022) for CPE.   Renee Rival, FNP

## 2021-11-10 NOTE — Assessment & Plan Note (Signed)
Currently on hemodialysis Monday Wednesdays Friday Patient encouraged to continue to attend hemodialysis sessions DASH diet advised

## 2021-11-10 NOTE — Assessment & Plan Note (Signed)
Lab Results  Component Value Date   WBC 10.2 10/29/2021   HGB 8.8 (L) 10/29/2021   HCT 28.2 (L) 10/29/2021   MCV 92.8 10/29/2021   PLT 212 10/29/2021  due to ESRD, will defer management to nephrology service

## 2021-11-10 NOTE — Patient Instructions (Addendum)
  Please get your shingles vaccine at the pharmacy as discussed   Come fasting for your next appointment so we can get labs   Please schedule your mammogram today     It is important that you exercise regularly at least 30 minutes 5 times a week as tolerated  Think about what you will eat, plan ahead. Choose " clean, green, fresh or frozen" over canned, processed or packaged foods which are more sugary, salty and fatty. 70 to 75% of food eaten should be vegetables and fruit. Three meals at set times with snacks allowed between meals, but they must be fruit or vegetables. Aim to eat over a 12 hour period , example 7 am to 7 pm, and STOP after  your last meal of the day. Drink water,generally about 64 ounces per day, no other drink is as healthy. Fruit juice is best enjoyed in a healthy way, by EATING the fruit.  Thanks for choosing [name] we consider it a privelige to serve you.

## 2021-11-11 DIAGNOSIS — Z992 Dependence on renal dialysis: Secondary | ICD-10-CM | POA: Diagnosis not present

## 2021-11-11 DIAGNOSIS — N186 End stage renal disease: Secondary | ICD-10-CM | POA: Diagnosis not present

## 2021-11-11 DIAGNOSIS — N2581 Secondary hyperparathyroidism of renal origin: Secondary | ICD-10-CM | POA: Diagnosis not present

## 2021-11-13 DIAGNOSIS — N186 End stage renal disease: Secondary | ICD-10-CM | POA: Diagnosis not present

## 2021-11-13 DIAGNOSIS — Z992 Dependence on renal dialysis: Secondary | ICD-10-CM | POA: Diagnosis not present

## 2021-11-13 DIAGNOSIS — N2581 Secondary hyperparathyroidism of renal origin: Secondary | ICD-10-CM | POA: Diagnosis not present

## 2021-11-16 DIAGNOSIS — N2581 Secondary hyperparathyroidism of renal origin: Secondary | ICD-10-CM | POA: Diagnosis not present

## 2021-11-16 DIAGNOSIS — Z992 Dependence on renal dialysis: Secondary | ICD-10-CM | POA: Diagnosis not present

## 2021-11-16 DIAGNOSIS — N186 End stage renal disease: Secondary | ICD-10-CM | POA: Diagnosis not present

## 2021-11-18 DIAGNOSIS — Z992 Dependence on renal dialysis: Secondary | ICD-10-CM | POA: Diagnosis not present

## 2021-11-18 DIAGNOSIS — N2581 Secondary hyperparathyroidism of renal origin: Secondary | ICD-10-CM | POA: Diagnosis not present

## 2021-11-18 DIAGNOSIS — N186 End stage renal disease: Secondary | ICD-10-CM | POA: Diagnosis not present

## 2021-11-19 ENCOUNTER — Encounter (HOSPITAL_COMMUNITY): Payer: BC Managed Care – PPO

## 2021-11-20 DIAGNOSIS — N2581 Secondary hyperparathyroidism of renal origin: Secondary | ICD-10-CM | POA: Diagnosis not present

## 2021-11-20 DIAGNOSIS — N186 End stage renal disease: Secondary | ICD-10-CM | POA: Diagnosis not present

## 2021-11-20 DIAGNOSIS — Z992 Dependence on renal dialysis: Secondary | ICD-10-CM | POA: Diagnosis not present

## 2021-11-23 DIAGNOSIS — N186 End stage renal disease: Secondary | ICD-10-CM | POA: Diagnosis not present

## 2021-11-23 DIAGNOSIS — Z992 Dependence on renal dialysis: Secondary | ICD-10-CM | POA: Diagnosis not present

## 2021-11-23 DIAGNOSIS — N2581 Secondary hyperparathyroidism of renal origin: Secondary | ICD-10-CM | POA: Diagnosis not present

## 2021-11-25 DIAGNOSIS — N186 End stage renal disease: Secondary | ICD-10-CM | POA: Diagnosis not present

## 2021-11-25 DIAGNOSIS — N2581 Secondary hyperparathyroidism of renal origin: Secondary | ICD-10-CM | POA: Diagnosis not present

## 2021-11-25 DIAGNOSIS — Z992 Dependence on renal dialysis: Secondary | ICD-10-CM | POA: Diagnosis not present

## 2021-11-27 DIAGNOSIS — Z992 Dependence on renal dialysis: Secondary | ICD-10-CM | POA: Diagnosis not present

## 2021-11-27 DIAGNOSIS — N2581 Secondary hyperparathyroidism of renal origin: Secondary | ICD-10-CM | POA: Diagnosis not present

## 2021-11-27 DIAGNOSIS — N186 End stage renal disease: Secondary | ICD-10-CM | POA: Diagnosis not present

## 2021-11-29 DIAGNOSIS — N2581 Secondary hyperparathyroidism of renal origin: Secondary | ICD-10-CM | POA: Diagnosis not present

## 2021-11-29 DIAGNOSIS — N186 End stage renal disease: Secondary | ICD-10-CM | POA: Diagnosis not present

## 2021-11-29 DIAGNOSIS — Z992 Dependence on renal dialysis: Secondary | ICD-10-CM | POA: Diagnosis not present

## 2021-12-01 DIAGNOSIS — N2581 Secondary hyperparathyroidism of renal origin: Secondary | ICD-10-CM | POA: Diagnosis not present

## 2021-12-01 DIAGNOSIS — N186 End stage renal disease: Secondary | ICD-10-CM | POA: Diagnosis not present

## 2021-12-01 DIAGNOSIS — Z992 Dependence on renal dialysis: Secondary | ICD-10-CM | POA: Diagnosis not present

## 2021-12-04 DIAGNOSIS — N2581 Secondary hyperparathyroidism of renal origin: Secondary | ICD-10-CM | POA: Diagnosis not present

## 2021-12-04 DIAGNOSIS — Z992 Dependence on renal dialysis: Secondary | ICD-10-CM | POA: Diagnosis not present

## 2021-12-04 DIAGNOSIS — N186 End stage renal disease: Secondary | ICD-10-CM | POA: Diagnosis not present

## 2021-12-08 DIAGNOSIS — N186 End stage renal disease: Secondary | ICD-10-CM | POA: Diagnosis not present

## 2021-12-08 DIAGNOSIS — N2581 Secondary hyperparathyroidism of renal origin: Secondary | ICD-10-CM | POA: Diagnosis not present

## 2021-12-08 DIAGNOSIS — Z992 Dependence on renal dialysis: Secondary | ICD-10-CM | POA: Diagnosis not present

## 2021-12-09 DIAGNOSIS — Z992 Dependence on renal dialysis: Secondary | ICD-10-CM | POA: Diagnosis not present

## 2021-12-09 DIAGNOSIS — N2581 Secondary hyperparathyroidism of renal origin: Secondary | ICD-10-CM | POA: Diagnosis not present

## 2021-12-09 DIAGNOSIS — N186 End stage renal disease: Secondary | ICD-10-CM | POA: Diagnosis not present

## 2021-12-10 DIAGNOSIS — Z992 Dependence on renal dialysis: Secondary | ICD-10-CM | POA: Diagnosis not present

## 2021-12-10 DIAGNOSIS — N186 End stage renal disease: Secondary | ICD-10-CM | POA: Diagnosis not present

## 2021-12-10 DIAGNOSIS — E1122 Type 2 diabetes mellitus with diabetic chronic kidney disease: Secondary | ICD-10-CM | POA: Diagnosis not present

## 2021-12-11 NOTE — Addendum Note (Signed)
Addended by: Renee Rival on: 12/11/2021 08:31 AM   Modules accepted: Level of Service

## 2021-12-12 DIAGNOSIS — Z992 Dependence on renal dialysis: Secondary | ICD-10-CM | POA: Diagnosis not present

## 2021-12-12 DIAGNOSIS — N186 End stage renal disease: Secondary | ICD-10-CM | POA: Diagnosis not present

## 2021-12-12 DIAGNOSIS — N2581 Secondary hyperparathyroidism of renal origin: Secondary | ICD-10-CM | POA: Diagnosis not present

## 2021-12-15 DIAGNOSIS — Z992 Dependence on renal dialysis: Secondary | ICD-10-CM | POA: Diagnosis not present

## 2021-12-15 DIAGNOSIS — N2581 Secondary hyperparathyroidism of renal origin: Secondary | ICD-10-CM | POA: Diagnosis not present

## 2021-12-15 DIAGNOSIS — N186 End stage renal disease: Secondary | ICD-10-CM | POA: Diagnosis not present

## 2021-12-17 DIAGNOSIS — N2581 Secondary hyperparathyroidism of renal origin: Secondary | ICD-10-CM | POA: Diagnosis not present

## 2021-12-17 DIAGNOSIS — N186 End stage renal disease: Secondary | ICD-10-CM | POA: Diagnosis not present

## 2021-12-17 DIAGNOSIS — Z992 Dependence on renal dialysis: Secondary | ICD-10-CM | POA: Diagnosis not present

## 2021-12-19 DIAGNOSIS — N186 End stage renal disease: Secondary | ICD-10-CM | POA: Diagnosis not present

## 2021-12-19 DIAGNOSIS — Z992 Dependence on renal dialysis: Secondary | ICD-10-CM | POA: Diagnosis not present

## 2021-12-19 DIAGNOSIS — N2581 Secondary hyperparathyroidism of renal origin: Secondary | ICD-10-CM | POA: Diagnosis not present

## 2021-12-22 DIAGNOSIS — N186 End stage renal disease: Secondary | ICD-10-CM | POA: Diagnosis not present

## 2021-12-22 DIAGNOSIS — Z992 Dependence on renal dialysis: Secondary | ICD-10-CM | POA: Diagnosis not present

## 2021-12-22 DIAGNOSIS — N2581 Secondary hyperparathyroidism of renal origin: Secondary | ICD-10-CM | POA: Diagnosis not present

## 2021-12-23 DIAGNOSIS — N2581 Secondary hyperparathyroidism of renal origin: Secondary | ICD-10-CM | POA: Diagnosis not present

## 2021-12-23 DIAGNOSIS — N186 End stage renal disease: Secondary | ICD-10-CM | POA: Diagnosis not present

## 2021-12-23 DIAGNOSIS — Z992 Dependence on renal dialysis: Secondary | ICD-10-CM | POA: Diagnosis not present

## 2021-12-26 DIAGNOSIS — N2581 Secondary hyperparathyroidism of renal origin: Secondary | ICD-10-CM | POA: Diagnosis not present

## 2021-12-26 DIAGNOSIS — Z992 Dependence on renal dialysis: Secondary | ICD-10-CM | POA: Diagnosis not present

## 2021-12-26 DIAGNOSIS — N186 End stage renal disease: Secondary | ICD-10-CM | POA: Diagnosis not present

## 2021-12-29 DIAGNOSIS — Z992 Dependence on renal dialysis: Secondary | ICD-10-CM | POA: Diagnosis not present

## 2021-12-29 DIAGNOSIS — N2581 Secondary hyperparathyroidism of renal origin: Secondary | ICD-10-CM | POA: Diagnosis not present

## 2021-12-29 DIAGNOSIS — N186 End stage renal disease: Secondary | ICD-10-CM | POA: Diagnosis not present

## 2021-12-31 DIAGNOSIS — N186 End stage renal disease: Secondary | ICD-10-CM | POA: Diagnosis not present

## 2021-12-31 DIAGNOSIS — N2581 Secondary hyperparathyroidism of renal origin: Secondary | ICD-10-CM | POA: Diagnosis not present

## 2021-12-31 DIAGNOSIS — Z992 Dependence on renal dialysis: Secondary | ICD-10-CM | POA: Diagnosis not present

## 2022-01-02 DIAGNOSIS — N186 End stage renal disease: Secondary | ICD-10-CM | POA: Diagnosis not present

## 2022-01-02 DIAGNOSIS — N2581 Secondary hyperparathyroidism of renal origin: Secondary | ICD-10-CM | POA: Diagnosis not present

## 2022-01-02 DIAGNOSIS — Z992 Dependence on renal dialysis: Secondary | ICD-10-CM | POA: Diagnosis not present

## 2022-01-05 DIAGNOSIS — N2581 Secondary hyperparathyroidism of renal origin: Secondary | ICD-10-CM | POA: Diagnosis not present

## 2022-01-05 DIAGNOSIS — Z992 Dependence on renal dialysis: Secondary | ICD-10-CM | POA: Diagnosis not present

## 2022-01-05 DIAGNOSIS — N186 End stage renal disease: Secondary | ICD-10-CM | POA: Diagnosis not present

## 2022-01-07 DIAGNOSIS — Z992 Dependence on renal dialysis: Secondary | ICD-10-CM | POA: Diagnosis not present

## 2022-01-07 DIAGNOSIS — N2581 Secondary hyperparathyroidism of renal origin: Secondary | ICD-10-CM | POA: Diagnosis not present

## 2022-01-07 DIAGNOSIS — N186 End stage renal disease: Secondary | ICD-10-CM | POA: Diagnosis not present

## 2022-01-08 ENCOUNTER — Other Ambulatory Visit (HOSPITAL_COMMUNITY)
Admission: RE | Admit: 2022-01-08 | Discharge: 2022-01-08 | Disposition: A | Discharge status: Home / Self Care | Payer: BC Managed Care – PPO | Source: Ambulatory Visit | Attending: Nurse Practitioner | Admitting: Nurse Practitioner

## 2022-01-08 ENCOUNTER — Ambulatory Visit (INDEPENDENT_AMBULATORY_CARE_PROVIDER_SITE_OTHER): Payer: BC Managed Care – PPO | Admitting: Nurse Practitioner

## 2022-01-08 ENCOUNTER — Encounter: Payer: Self-pay | Admitting: Nurse Practitioner

## 2022-01-08 VITALS — BP 131/54 | HR 75 | Temp 97.5°F | Ht 64.0 in | Wt 184.5 lb

## 2022-01-08 DIAGNOSIS — Z124 Encounter for screening for malignant neoplasm of cervix: Secondary | ICD-10-CM

## 2022-01-08 DIAGNOSIS — L989 Disorder of the skin and subcutaneous tissue, unspecified: Secondary | ICD-10-CM

## 2022-01-08 DIAGNOSIS — Z0000 Encounter for general adult medical examination without abnormal findings: Secondary | ICD-10-CM | POA: Diagnosis not present

## 2022-01-08 DIAGNOSIS — Z01818 Encounter for other preprocedural examination: Secondary | ICD-10-CM | POA: Insufficient documentation

## 2022-01-08 DIAGNOSIS — E785 Hyperlipidemia, unspecified: Secondary | ICD-10-CM

## 2022-01-08 DIAGNOSIS — L729 Follicular cyst of the skin and subcutaneous tissue, unspecified: Secondary | ICD-10-CM | POA: Insufficient documentation

## 2022-01-08 DIAGNOSIS — M79621 Pain in right upper arm: Secondary | ICD-10-CM

## 2022-01-08 LAB — RESULTS CONSOLE HPV: CHL HPV: NEGATIVE

## 2022-01-08 NOTE — Assessment & Plan Note (Signed)
Wt Readings from Last 3 Encounters:  01/08/22 184 lb 8 oz (83.7 kg)  11/10/21 178 lb (80.7 kg)  11/06/21 182 lb 9.6 oz (82.8 kg)  Need to engage in regular moderate exercises at least 150 minutes weekly encouraged. Patient counseled on low-carb modified diet

## 2022-01-08 NOTE — Assessment & Plan Note (Signed)
Crusted skin lesion noted on the face Patient was referred to dermatology

## 2022-01-08 NOTE — Patient Instructions (Addendum)
Please get your mammogram done as discussed  It is important that you exercise regularly at least 30 minutes 5 times a week as tolerated  Think about what you will eat, plan ahead. Choose " clean, green, fresh or frozen" over canned, processed or packaged foods which are more sugary, salty and fatty. 70 to 75% of food eaten should be vegetables and fruit. Three meals at set times with snacks allowed between meals, but they must be fruit or vegetables. Aim to eat over a 12 hour period , example 7 am to 7 pm, and STOP after  your last meal of the day. Drink water,generally about 64 ounces per day, no other drink is as healthy. Fruit juice is best enjoyed in a healthy way, by EATING the fruit.  Thanks for choosing [facility] we consider it a privelige to serve you.

## 2022-01-08 NOTE — Assessment & Plan Note (Signed)
Annual exam as documented.  Counseling done include healthy lifestyle involving committing to 150 minutes of exercise per week, heart healthy diet, and attaining healthy weight. The importance of adequate sleep also discussed.  Regular use of seat belt and home safety were also discussed . [place] Immunization and cancer screening  needs are specifically addressed at this visit.   Cervical Pap exam completed.  Patient encouraged to schedule her mammogram.  Shingles vaccine administered in the office

## 2022-01-08 NOTE — Assessment & Plan Note (Signed)
Cyst nontender on examination, will continue to monitor

## 2022-01-08 NOTE — Assessment & Plan Note (Signed)
Patient told to take Tylenol 650 mg every 6 hours as needed for her pain. Application of heat also encouraged and range of motion exercises encouraged

## 2022-01-08 NOTE — Progress Notes (Signed)
Complete physical exam  Patient: [name]   DOB: 12-11-1960   61 y.o. Female  MRN: [id number]  Subjective:    Chief Complaint  Patient presents with   Annual Exam    Not fasting     [name] is a 61 y.o. female with past medical history of CAD, essential hypertension, end-stage renal disease, morbid obesity who presents who presents today for a complete physical exam. She reports consuming a low fat and low sodium diet. Does not exercise. She generally feels well. She reports sleeping well.   Patient complains of chronic right arm pain.  States that it feels like she sleeps on the arm but she does not.  Currently has aching pain rated 3/10 pain is worse with lifting.  She has not taken any medication.  She denies fever, chills, shortness of breath, malaise.  Has 2 crusty lesions on her face, states that this lesion has been there for years.  She has seen dermatologist in the past for similar lesions.  She was told that the lesions were non cancerous.  Would like referral to dermatology.  Due for shingles vaccine shingles vaccine given in the office today.  She was encouraged to schedule an appointment for her mammogram as she has not  done so.  Pap exam completed in the office today  Most recent fall risk assessment:    11/10/2021    9:19 AM  [place] in the past year? 0  Number falls in past yr: 0  Injury with Fall? 0  Risk for fall due to : No Fall Risks     Most recent depression screenings:    01/08/2022   11:20 AM 11/10/2021    9:20 AM  PHQ 2/9 Scores  PHQ - 2 Score 0 0  PHQ- 9 Score 0 0         Patient Care Team: Renee Rival, FNP as PCP - General (Nurse Practitioner) Lorretta Harp, MD as PCP - Cardiology (Cardiology)   Outpatient Medications Prior to Visit  Medication Sig Note   amLODipine (NORVASC) 10 MG tablet Take 1 tablet (10 mg total) by mouth daily.    carvedilol (COREG) 12.5 MG tablet Take 1 tablet (12.5 mg  total) by mouth 2 (two) times daily with a meal.    hydrALAZINE (APRESOLINE) 100 MG tablet Take 1 tablet (100 mg total) by mouth 3 (three) times daily. 01/08/2022: Takes '50mg'$  BID   lidocaine-prilocaine (EMLA) cream Apply 1 Application topically as needed (for HD).    sevelamer carbonate (RENVELA) 800 MG tablet Take 1,600 mg by mouth 3 (three) times daily with meals.    cloNIDine (CATAPRES) 0.2 MG tablet Take 1 tablet (0.2 mg total) by mouth 2 (two) times daily. Appointment needed (Patient not taking: Reported on 11/10/2021)    No facility-administered medications prior to visit.    Review of Systems  Constitutional:  Negative for chills, diaphoresis, fever, malaise/fatigue and weight loss.  HENT:  Negative for congestion, ear discharge, ear pain, hearing loss, sinus pain, sore throat and tinnitus.   Eyes:  Negative for pain, discharge and redness.  Respiratory:  Negative for cough, hemoptysis, sputum production, shortness of breath, wheezing and stridor.   Cardiovascular:  Negative for chest pain, palpitations, orthopnea, claudication and leg swelling.  Gastrointestinal:  Negative for abdominal pain, constipation, diarrhea, heartburn, nausea and vomiting.  Genitourinary:  Negative for dysuria, flank pain, frequency, hematuria and urgency.  Musculoskeletal:  Positive for myalgias. Negative  for back pain, falls, joint pain and neck pain.  Skin:  Positive for rash. Negative for itching.  Neurological:  Negative for dizziness, tingling, tremors, sensory change, speech change, focal weakness, seizures, loss of consciousness, weakness and headaches.  Endo/Heme/Allergies:  Negative for environmental allergies and polydipsia. Does not bruise/bleed easily.  Psychiatric/Behavioral:  Negative for depression, hallucinations, memory loss, substance abuse and suicidal ideas. The patient is not nervous/anxious and does not have insomnia.           Objective:     BP (!) 131/54   Pulse 75   Temp (!)  97.5 F (36.4 C)   Ht '5\' 4"'$  (1.626 m)   Wt 184 lb 8 oz (83.7 kg)   LMP 12/14/2013   SpO2 94%   BMI 31.67 kg/m     Physical Exam Exam conducted with a chaperone present.  Constitutional:      General: She is not in acute distress.    Appearance: She is obese. She is not ill-appearing, toxic-appearing or diaphoretic.  HENT:     Right Ear: Tympanic membrane, ear canal and external ear normal. There is no impacted cerumen.     Left Ear: Tympanic membrane, ear canal and external ear normal. There is no impacted cerumen.     Nose: No congestion or rhinorrhea.     Mouth/Throat:     Mouth: Mucous membranes are moist.     Pharynx: Oropharynx is clear. No oropharyngeal exudate or posterior oropharyngeal erythema.  Eyes:     General: No scleral icterus.       Right eye: No discharge.        Left eye: No discharge.     Extraocular Movements: Extraocular movements intact.     Conjunctiva/sclera: Conjunctivae normal.  Neck:     Vascular: No carotid bruit.  Cardiovascular:     Rate and Rhythm: Normal rate and regular rhythm.     Pulses: Normal pulses.     Heart sounds: Normal heart sounds. No murmur heard.    No friction rub. No gallop.  Pulmonary:     Effort: Pulmonary effort is normal. No respiratory distress.     Breath sounds: Normal breath sounds. No stridor. No wheezing, rhonchi or rales.  Chest:     Chest wall: No mass, lacerations, deformity, swelling, tenderness, crepitus or edema.  Breasts:    Tanner Score is 5.     Breasts are symmetrical.     Right: No swelling, bleeding, inverted nipple, mass, nipple discharge, skin change or tenderness.     Left: No swelling, bleeding, inverted nipple, mass, nipple discharge, skin change or tenderness.  Abdominal:     General: There is no distension.     Palpations: Abdomen is soft. There is no mass.     Tenderness: There is no abdominal tenderness. There is no right CVA tenderness, left CVA tenderness, guarding or rebound.     Hernia:  No hernia is present. There is no hernia in the left inguinal area or right inguinal area.  Genitourinary:    Exam position: Lithotomy position.     Pubic Area: No rash or pubic lice.      Tanner stage (genital): 5.     Labia:        Right: No rash, tenderness, lesion or injury.        Left: No rash, tenderness, lesion or injury.      Urethra: No prolapse, urethral pain, urethral swelling or urethral lesion.     Vagina: No  signs of injury and foreign body. No vaginal discharge, erythema, tenderness, bleeding, lesions or prolapsed vaginal walls.     Cervix: Dilated. No cervical motion tenderness, discharge, friability, lesion, erythema, cervical bleeding or eversion.     Uterus: Not deviated, not enlarged, not fixed, not tender and no uterine prolapse.      Adnexa:        Right: No mass, tenderness or fullness.         Left: No mass, tenderness or fullness.    Musculoskeletal:        General: No swelling, tenderness, deformity or signs of injury.     Cervical back: No rigidity or tenderness.     Right lower leg: No edema.     Left lower leg: No edema.  Lymphadenopathy:     Cervical: No cervical adenopathy.     Upper Body:     Right upper body: No supraclavicular, axillary or pectoral adenopathy.     Left upper body: No supraclavicular, axillary or pectoral adenopathy.     Lower Body: No right inguinal adenopathy. No left inguinal adenopathy.  Skin:    General: Skin is warm and dry.     Capillary Refill: Capillary refill takes less than 2 seconds.     Coloration: Skin is not jaundiced or pale.     Findings: Lesion present. No bruising or erythema.     Comments: Crusty lesion noted on the face, no drainage, swelling, redness  noted .has a cyst on her left temple.  Neurological:     Mental Status: She is alert and oriented to person, place, and time.     Cranial Nerves: No cranial nerve deficit.     Sensory: No sensory deficit.     Motor: No weakness.     Coordination: Coordination  normal.     Gait: Gait normal.     Deep Tendon Reflexes: Reflexes normal.  Psychiatric:        Mood and Affect: Mood normal.        Behavior: Behavior normal.        Thought Content: Thought content normal.        Judgment: Judgment normal.      No results found for any visits on 01/08/22.      Assessment & Plan:    Routine Health Maintenance and Physical Exam  Immunization History  Administered Date(s) Administered   Influenza Inj Mdck Quad Pf 10/29/2017   Influenza,inj,Quad PF,6+ Mos 10/09/2016, 10/06/2018, 10/15/2021   Influenza-Unspecified 11/29/2014, 11/28/2017   Pneumococcal Polysaccharide-23 08/31/2012   Tdap 04/03/2016    Health Maintenance  Topic Date Due   COVID-19 Vaccine (1) Never done   OPHTHALMOLOGY EXAM  Never done   Zoster Vaccines- Shingrix (1 of 2) Never done   PAP SMEAR-Modifier  04/04/2019   MAMMOGRAM  10/14/2019   HEMOGLOBIN A1C  04/25/2022   FOOT EXAM  11/11/2022   COLONOSCOPY (Pts 45-4yr Insurance coverage will need to be confirmed)  07/05/2024   DTaP/Tdap/Td (2 - Td or Tdap) 04/04/2026   INFLUENZA VACCINE  Completed   Hepatitis C Screening  Completed   HIV Screening  Completed   HPV VACCINES  Aged Out    Discussed health benefits of physical activity, and encouraged her to engage in regular exercise appropriate for her age and condition.  Problem List Items Addressed This Visit       Musculoskeletal and Integument   Cyst of skin    Cyst nontender on examination, will continue to monitor  Skin lesion    Crusted skin lesion noted on the face Patient was referred to dermatology      Relevant Orders   Ambulatory referral to Dermatology     Other   Dyslipidemia- LDL goal < 70 (Chronic)   Relevant Orders   Lipid Panel   Morbid obesity due to excess calories ([place])    Wt Readings from Last 3 Encounters:  01/08/22 184 lb 8 oz (83.7 kg)  11/10/21 178 lb (80.7 kg)  11/06/21 182 lb 9.6 oz (82.8 kg)  Need to engage in regular  moderate exercises at least 150 minutes weekly encouraged. Patient counseled on low-carb modified diet      Pain in right upper arm    Patient told to take Tylenol 650 mg every 6 hours as needed for her pain. Application of heat also encouraged and range of motion exercises encouraged      Annual physical exam - Primary    Annual exam as documented.  Counseling done include healthy lifestyle involving committing to 150 minutes of exercise per week, heart healthy diet, and attaining healthy weight. The importance of adequate sleep also discussed.  Regular use of seat belt and home safety were also discussed . [place] Immunization and cancer screening  needs are specifically addressed at this visit.   Cervical Pap exam completed.  Patient encouraged to schedule her mammogram.  Shingles vaccine administered in the office      Screening for cervical cancer   Relevant Orders   Cytology - PAP([place])   Return in about 6 months (around 07/10/2022) for HTN.     Renee Rival, FNP

## 2022-01-09 DIAGNOSIS — N2581 Secondary hyperparathyroidism of renal origin: Secondary | ICD-10-CM | POA: Diagnosis not present

## 2022-01-09 DIAGNOSIS — Z992 Dependence on renal dialysis: Secondary | ICD-10-CM | POA: Diagnosis not present

## 2022-01-09 DIAGNOSIS — N186 End stage renal disease: Secondary | ICD-10-CM | POA: Diagnosis not present

## 2022-01-10 DIAGNOSIS — Z992 Dependence on renal dialysis: Secondary | ICD-10-CM | POA: Diagnosis not present

## 2022-01-10 DIAGNOSIS — E1122 Type 2 diabetes mellitus with diabetic chronic kidney disease: Secondary | ICD-10-CM | POA: Diagnosis not present

## 2022-01-10 DIAGNOSIS — N186 End stage renal disease: Secondary | ICD-10-CM | POA: Diagnosis not present

## 2022-01-12 DIAGNOSIS — N2581 Secondary hyperparathyroidism of renal origin: Secondary | ICD-10-CM | POA: Diagnosis not present

## 2022-01-12 DIAGNOSIS — N186 End stage renal disease: Secondary | ICD-10-CM | POA: Diagnosis not present

## 2022-01-12 DIAGNOSIS — Z992 Dependence on renal dialysis: Secondary | ICD-10-CM | POA: Diagnosis not present

## 2022-01-13 LAB — CYTOLOGY - PAP
Adequacy: ABSENT
Diagnosis: NEGATIVE
Diagnosis: REACTIVE

## 2022-01-14 DIAGNOSIS — Z992 Dependence on renal dialysis: Secondary | ICD-10-CM | POA: Diagnosis not present

## 2022-01-14 DIAGNOSIS — N186 End stage renal disease: Secondary | ICD-10-CM | POA: Diagnosis not present

## 2022-01-14 DIAGNOSIS — N2581 Secondary hyperparathyroidism of renal origin: Secondary | ICD-10-CM | POA: Diagnosis not present

## 2022-01-15 NOTE — Progress Notes (Signed)
Normal pap cytology

## 2022-01-19 DIAGNOSIS — Z992 Dependence on renal dialysis: Secondary | ICD-10-CM | POA: Diagnosis not present

## 2022-01-19 DIAGNOSIS — N186 End stage renal disease: Secondary | ICD-10-CM | POA: Diagnosis not present

## 2022-01-19 DIAGNOSIS — N2581 Secondary hyperparathyroidism of renal origin: Secondary | ICD-10-CM | POA: Diagnosis not present

## 2022-01-19 NOTE — Progress Notes (Deleted)
Subjective:    [name] - 62 y.o. female MRN [id number]  Date of birth: Aug 24, 1960  HPI  [name] is to establish care.   Current issues and/or concerns: 01/08/2022 [place], annual physical exam   Derm - skin lesion  Cards - HTN, HLD CAD   HTN - Amlodipine, Carvedilol, Hydralazine, Clonidine   ROS per HPI     Health Maintenance:  Health Maintenance Due  Topic Date Due   COVID-19 Vaccine (1) Never done   OPHTHALMOLOGY EXAM  Never done   Zoster Vaccines- Shingrix (1 of 2) Never done   MAMMOGRAM  10/14/2019     Past Medical History: Patient Active Problem List   Diagnosis Date Noted   Cyst of skin 01/08/2022   Morbid obesity due to excess calories ([place]) 01/08/2022   Pain in right upper arm 01/08/2022   Skin lesion 01/08/2022   Annual physical exam 01/08/2022   Screening for cervical cancer 01/08/2022   Normocytic anemia 11/10/2021   Hospital discharge follow-up 11/10/2021   Hypertensive urgency 10/24/2021   CKD (chronic kidney disease), stage V ([place]) 10/24/2021   Dietary counseling and surveillance 03/12/2020   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   Complex renal cyst 11/29/2019   Acute on chronic diastolic heart failure ([place]) 11/13/2019   Edema 10/18/2019   Truncal obesity 10/18/2019   DM (diabetes mellitus), type 2 with renal complications (HCC)    Hx of adenomatous colonic polyps 06/11/2016   Diverticulosis 06/07/2016   Sleep apnea 03/18/2016   CRI (chronic renal insufficiency), stage 4 (severe) ([place]) 03/18/2016   Increased risk of breast cancer 12/17/2015   Hypertensive heart disease    Hematoma of groin - right, s/p cath; stable on discharge 09/02/2012   Pleuritic chest pain 08/31/2012   Obesity (BMI 30-39.9) 08/31/2012   CAD (coronary artery disease) 04/15/2011   Abnormal stress test, Lexiscan myoview 03/25/2011-sm. anterior ischemia 04/15/2011   Essential hypertension 04/15/2011   Dyslipidemia- LDL goal <  70 [id number]   Metabolic syndrome [id number]   Tobacco abuse 04/15/2011   Family history of premature CAD 04/15/2011      Social History   reports that she quit smoking about 2 months ago. Her smoking use included cigarettes. She has a 7.50 pack-year smoking history. She has never used smokeless tobacco. She reports that she does not drink alcohol and does not use drugs.   Family History  family history includes Cancer in her maternal grandmother; Coronary artery disease in her brother; Diabetes in her mother; Heart attack in her maternal grandfather; Heart attack (age of onset: 48) in her maternal aunt; Hypertension in her father and maternal grandfather; Stroke in her father and maternal grandmother.   Medications: reviewed and updated   Objective:   Physical Exam LMP 12/14/2013  Physical Exam      Assessment & Plan:         Patient was given clear instructions to go to Emergency Department or return to medical center if symptoms don't improve, worsen, or new problems develop.The patient verbalized understanding.  I discussed the assessment and treatment plan with the patient. The patient was provided an opportunity to ask questions and all were answered. The patient agreed with the plan and demonstrated an understanding of the instructions.   The patient was advised to call back or seek an in-person evaluation if the symptoms worsen or if the condition fails to improve as anticipated.    Durene Fruits, NP 01/19/2022, 11:58 AM Primary Care  at [facility]

## 2022-01-20 ENCOUNTER — Ambulatory Visit: Payer: BC Managed Care – PPO | Admitting: Family

## 2022-01-21 ENCOUNTER — Ambulatory Visit: Payer: BC Managed Care – PPO | Admitting: Family

## 2022-01-21 DIAGNOSIS — Z992 Dependence on renal dialysis: Secondary | ICD-10-CM | POA: Diagnosis not present

## 2022-01-21 DIAGNOSIS — N186 End stage renal disease: Secondary | ICD-10-CM | POA: Diagnosis not present

## 2022-01-21 DIAGNOSIS — N2581 Secondary hyperparathyroidism of renal origin: Secondary | ICD-10-CM | POA: Diagnosis not present

## 2022-01-22 DIAGNOSIS — Z1231 Encounter for screening mammogram for malignant neoplasm of breast: Secondary | ICD-10-CM | POA: Diagnosis not present

## 2022-01-23 DIAGNOSIS — Z992 Dependence on renal dialysis: Secondary | ICD-10-CM | POA: Diagnosis not present

## 2022-01-23 DIAGNOSIS — N2581 Secondary hyperparathyroidism of renal origin: Secondary | ICD-10-CM | POA: Diagnosis not present

## 2022-01-23 DIAGNOSIS — N186 End stage renal disease: Secondary | ICD-10-CM | POA: Diagnosis not present

## 2022-01-25 ENCOUNTER — Other Ambulatory Visit: Payer: Self-pay

## 2022-01-25 MED ORDER — CARVEDILOL 12.5 MG PO TABS: 12.5000 mg | ORAL_TABLET | Freq: Two times a day (BID) | ORAL | 6 refills | Status: DC

## 2022-01-26 DIAGNOSIS — N2581 Secondary hyperparathyroidism of renal origin: Secondary | ICD-10-CM | POA: Diagnosis not present

## 2022-01-26 DIAGNOSIS — N186 End stage renal disease: Secondary | ICD-10-CM | POA: Diagnosis not present

## 2022-01-26 DIAGNOSIS — Z992 Dependence on renal dialysis: Secondary | ICD-10-CM | POA: Diagnosis not present

## 2022-01-28 DIAGNOSIS — Z992 Dependence on renal dialysis: Secondary | ICD-10-CM | POA: Diagnosis not present

## 2022-01-28 DIAGNOSIS — N2581 Secondary hyperparathyroidism of renal origin: Secondary | ICD-10-CM | POA: Diagnosis not present

## 2022-01-28 DIAGNOSIS — N186 End stage renal disease: Secondary | ICD-10-CM | POA: Diagnosis not present

## 2022-01-29 ENCOUNTER — Other Ambulatory Visit: Payer: Self-pay

## 2022-01-29 DIAGNOSIS — I2510 Atherosclerotic heart disease of native coronary artery without angina pectoris: Secondary | ICD-10-CM | POA: Diagnosis not present

## 2022-01-29 DIAGNOSIS — Z114 Encounter for screening for human immunodeficiency virus [HIV]: Secondary | ICD-10-CM | POA: Diagnosis not present

## 2022-01-29 DIAGNOSIS — Z79899 Other long term (current) drug therapy: Secondary | ICD-10-CM | POA: Diagnosis not present

## 2022-01-29 DIAGNOSIS — G4733 Obstructive sleep apnea (adult) (pediatric): Secondary | ICD-10-CM | POA: Diagnosis not present

## 2022-01-29 DIAGNOSIS — K219 Gastro-esophageal reflux disease without esophagitis: Secondary | ICD-10-CM | POA: Diagnosis not present

## 2022-01-29 DIAGNOSIS — Z01818 Encounter for other preprocedural examination: Secondary | ICD-10-CM | POA: Diagnosis not present

## 2022-01-29 DIAGNOSIS — N186 End stage renal disease: Secondary | ICD-10-CM | POA: Diagnosis not present

## 2022-01-29 DIAGNOSIS — E1122 Type 2 diabetes mellitus with diabetic chronic kidney disease: Secondary | ICD-10-CM | POA: Diagnosis not present

## 2022-01-29 DIAGNOSIS — Z7682 Awaiting organ transplant status: Secondary | ICD-10-CM | POA: Diagnosis not present

## 2022-01-29 DIAGNOSIS — I10 Essential (primary) hypertension: Secondary | ICD-10-CM | POA: Diagnosis not present

## 2022-01-29 DIAGNOSIS — N185 Chronic kidney disease, stage 5: Secondary | ICD-10-CM | POA: Diagnosis not present

## 2022-01-29 DIAGNOSIS — J449 Chronic obstructive pulmonary disease, unspecified: Secondary | ICD-10-CM | POA: Diagnosis not present

## 2022-01-29 DIAGNOSIS — Z87891 Personal history of nicotine dependence: Secondary | ICD-10-CM | POA: Diagnosis not present

## 2022-01-29 DIAGNOSIS — Q613 Polycystic kidney, unspecified: Secondary | ICD-10-CM | POA: Diagnosis not present

## 2022-01-29 DIAGNOSIS — Z992 Dependence on renal dialysis: Secondary | ICD-10-CM | POA: Diagnosis not present

## 2022-01-29 DIAGNOSIS — E785 Hyperlipidemia, unspecified: Secondary | ICD-10-CM | POA: Diagnosis not present

## 2022-01-29 DIAGNOSIS — Z111 Encounter for screening for respiratory tuberculosis: Secondary | ICD-10-CM | POA: Diagnosis not present

## 2022-01-29 DIAGNOSIS — Z85118 Personal history of other malignant neoplasm of bronchus and lung: Secondary | ICD-10-CM | POA: Diagnosis not present

## 2022-01-29 DIAGNOSIS — I1311 Hypertensive heart and chronic kidney disease without heart failure, with stage 5 chronic kidney disease, or end stage renal disease: Secondary | ICD-10-CM | POA: Diagnosis not present

## 2022-01-29 DIAGNOSIS — Z1159 Encounter for screening for other viral diseases: Secondary | ICD-10-CM | POA: Diagnosis not present

## 2022-01-30 DIAGNOSIS — N2581 Secondary hyperparathyroidism of renal origin: Secondary | ICD-10-CM | POA: Diagnosis not present

## 2022-01-30 DIAGNOSIS — Z992 Dependence on renal dialysis: Secondary | ICD-10-CM | POA: Diagnosis not present

## 2022-01-30 DIAGNOSIS — N186 End stage renal disease: Secondary | ICD-10-CM | POA: Diagnosis not present

## 2022-02-02 DIAGNOSIS — Z992 Dependence on renal dialysis: Secondary | ICD-10-CM | POA: Diagnosis not present

## 2022-02-02 DIAGNOSIS — N2581 Secondary hyperparathyroidism of renal origin: Secondary | ICD-10-CM | POA: Diagnosis not present

## 2022-02-02 DIAGNOSIS — N186 End stage renal disease: Secondary | ICD-10-CM | POA: Diagnosis not present

## 2022-02-04 DIAGNOSIS — Z992 Dependence on renal dialysis: Secondary | ICD-10-CM | POA: Diagnosis not present

## 2022-02-04 DIAGNOSIS — N186 End stage renal disease: Secondary | ICD-10-CM | POA: Diagnosis not present

## 2022-02-04 DIAGNOSIS — K219 Gastro-esophageal reflux disease without esophagitis: Secondary | ICD-10-CM | POA: Insufficient documentation

## 2022-02-04 DIAGNOSIS — N2581 Secondary hyperparathyroidism of renal origin: Secondary | ICD-10-CM | POA: Diagnosis not present

## 2022-02-04 DIAGNOSIS — E119 Type 2 diabetes mellitus without complications: Secondary | ICD-10-CM | POA: Insufficient documentation

## 2022-02-06 DIAGNOSIS — N186 End stage renal disease: Secondary | ICD-10-CM | POA: Diagnosis not present

## 2022-02-06 DIAGNOSIS — Z992 Dependence on renal dialysis: Secondary | ICD-10-CM | POA: Diagnosis not present

## 2022-02-06 DIAGNOSIS — N2581 Secondary hyperparathyroidism of renal origin: Secondary | ICD-10-CM | POA: Diagnosis not present

## 2022-02-09 DIAGNOSIS — N186 End stage renal disease: Secondary | ICD-10-CM | POA: Diagnosis not present

## 2022-02-09 DIAGNOSIS — Z992 Dependence on renal dialysis: Secondary | ICD-10-CM | POA: Diagnosis not present

## 2022-02-09 DIAGNOSIS — N2581 Secondary hyperparathyroidism of renal origin: Secondary | ICD-10-CM | POA: Diagnosis not present

## 2022-02-10 ENCOUNTER — Other Ambulatory Visit (HOSPITAL_COMMUNITY): Payer: Self-pay | Admitting: Nephrology

## 2022-02-10 DIAGNOSIS — R935 Abnormal findings on diagnostic imaging of other abdominal regions, including retroperitoneum: Secondary | ICD-10-CM

## 2022-02-10 DIAGNOSIS — Z992 Dependence on renal dialysis: Secondary | ICD-10-CM | POA: Diagnosis not present

## 2022-02-10 DIAGNOSIS — N186 End stage renal disease: Secondary | ICD-10-CM | POA: Diagnosis not present

## 2022-02-10 DIAGNOSIS — Z7682 Awaiting organ transplant status: Secondary | ICD-10-CM

## 2022-02-10 DIAGNOSIS — E1122 Type 2 diabetes mellitus with diabetic chronic kidney disease: Secondary | ICD-10-CM | POA: Diagnosis not present

## 2022-02-11 ENCOUNTER — Other Ambulatory Visit (HOSPITAL_COMMUNITY): Payer: Self-pay | Admitting: Respiratory Therapy

## 2022-02-11 DIAGNOSIS — N2581 Secondary hyperparathyroidism of renal origin: Secondary | ICD-10-CM | POA: Diagnosis not present

## 2022-02-11 DIAGNOSIS — N186 End stage renal disease: Secondary | ICD-10-CM | POA: Diagnosis not present

## 2022-02-11 DIAGNOSIS — Z7682 Awaiting organ transplant status: Secondary | ICD-10-CM

## 2022-02-11 DIAGNOSIS — Z992 Dependence on renal dialysis: Secondary | ICD-10-CM | POA: Diagnosis not present

## 2022-02-13 DIAGNOSIS — N2581 Secondary hyperparathyroidism of renal origin: Secondary | ICD-10-CM | POA: Diagnosis not present

## 2022-02-13 DIAGNOSIS — Z992 Dependence on renal dialysis: Secondary | ICD-10-CM | POA: Diagnosis not present

## 2022-02-13 DIAGNOSIS — N186 End stage renal disease: Secondary | ICD-10-CM | POA: Diagnosis not present

## 2022-02-16 DIAGNOSIS — N2581 Secondary hyperparathyroidism of renal origin: Secondary | ICD-10-CM | POA: Diagnosis not present

## 2022-02-16 DIAGNOSIS — N186 End stage renal disease: Secondary | ICD-10-CM | POA: Diagnosis not present

## 2022-02-16 DIAGNOSIS — Z992 Dependence on renal dialysis: Secondary | ICD-10-CM | POA: Diagnosis not present

## 2022-02-17 ENCOUNTER — Ambulatory Visit (HOSPITAL_COMMUNITY)
Admission: RE | Admit: 2022-02-17 | Discharge: 2022-02-17 | Disposition: A | Discharge status: Home / Self Care | Payer: BC Managed Care – PPO | Source: Ambulatory Visit | Attending: Nephrology | Admitting: Nephrology

## 2022-02-17 DIAGNOSIS — Z7682 Awaiting organ transplant status: Secondary | ICD-10-CM | POA: Diagnosis not present

## 2022-02-17 MED ORDER — ALBUTEROL SULFATE (2.5 MG/3ML) 0.083% IN NEBU
2.5000 mg | INHALATION_SOLUTION | Freq: Once | RESPIRATORY_TRACT | Status: AC
  Administered 2022-02-17: 2.5 mg via RESPIRATORY_TRACT

## 2022-02-18 DIAGNOSIS — Z992 Dependence on renal dialysis: Secondary | ICD-10-CM | POA: Diagnosis not present

## 2022-02-18 DIAGNOSIS — N2581 Secondary hyperparathyroidism of renal origin: Secondary | ICD-10-CM | POA: Diagnosis not present

## 2022-02-18 DIAGNOSIS — N186 End stage renal disease: Secondary | ICD-10-CM | POA: Diagnosis not present

## 2022-02-20 DIAGNOSIS — N186 End stage renal disease: Secondary | ICD-10-CM | POA: Diagnosis not present

## 2022-02-20 DIAGNOSIS — N2581 Secondary hyperparathyroidism of renal origin: Secondary | ICD-10-CM | POA: Diagnosis not present

## 2022-02-20 DIAGNOSIS — Z992 Dependence on renal dialysis: Secondary | ICD-10-CM | POA: Diagnosis not present

## 2022-02-22 DIAGNOSIS — L820 Inflamed seborrheic keratosis: Secondary | ICD-10-CM | POA: Diagnosis not present

## 2022-02-22 DIAGNOSIS — L720 Epidermal cyst: Secondary | ICD-10-CM | POA: Diagnosis not present

## 2022-02-23 DIAGNOSIS — N2581 Secondary hyperparathyroidism of renal origin: Secondary | ICD-10-CM | POA: Diagnosis not present

## 2022-02-23 DIAGNOSIS — Z992 Dependence on renal dialysis: Secondary | ICD-10-CM | POA: Diagnosis not present

## 2022-02-23 DIAGNOSIS — Z23 Encounter for immunization: Secondary | ICD-10-CM | POA: Insufficient documentation

## 2022-02-23 DIAGNOSIS — N186 End stage renal disease: Secondary | ICD-10-CM | POA: Diagnosis not present

## 2022-02-25 DIAGNOSIS — N2581 Secondary hyperparathyroidism of renal origin: Secondary | ICD-10-CM | POA: Diagnosis not present

## 2022-02-25 DIAGNOSIS — Z992 Dependence on renal dialysis: Secondary | ICD-10-CM | POA: Diagnosis not present

## 2022-02-25 DIAGNOSIS — N186 End stage renal disease: Secondary | ICD-10-CM | POA: Diagnosis not present

## 2022-02-27 DIAGNOSIS — N2581 Secondary hyperparathyroidism of renal origin: Secondary | ICD-10-CM | POA: Diagnosis not present

## 2022-02-27 DIAGNOSIS — N186 End stage renal disease: Secondary | ICD-10-CM | POA: Diagnosis not present

## 2022-02-27 DIAGNOSIS — Z992 Dependence on renal dialysis: Secondary | ICD-10-CM | POA: Diagnosis not present

## 2022-03-02 DIAGNOSIS — Z992 Dependence on renal dialysis: Secondary | ICD-10-CM | POA: Diagnosis not present

## 2022-03-02 DIAGNOSIS — N2581 Secondary hyperparathyroidism of renal origin: Secondary | ICD-10-CM | POA: Diagnosis not present

## 2022-03-02 DIAGNOSIS — N186 End stage renal disease: Secondary | ICD-10-CM | POA: Diagnosis not present

## 2022-03-03 ENCOUNTER — Ambulatory Visit (HOSPITAL_COMMUNITY): Payer: BC Managed Care – PPO

## 2022-03-03 ENCOUNTER — Encounter (HOSPITAL_COMMUNITY): Payer: Self-pay

## 2022-03-04 DIAGNOSIS — Z992 Dependence on renal dialysis: Secondary | ICD-10-CM | POA: Diagnosis not present

## 2022-03-04 DIAGNOSIS — N2581 Secondary hyperparathyroidism of renal origin: Secondary | ICD-10-CM | POA: Diagnosis not present

## 2022-03-04 DIAGNOSIS — N186 End stage renal disease: Secondary | ICD-10-CM | POA: Diagnosis not present

## 2022-03-06 DIAGNOSIS — N186 End stage renal disease: Secondary | ICD-10-CM | POA: Diagnosis not present

## 2022-03-06 DIAGNOSIS — N2581 Secondary hyperparathyroidism of renal origin: Secondary | ICD-10-CM | POA: Diagnosis not present

## 2022-03-06 DIAGNOSIS — Z992 Dependence on renal dialysis: Secondary | ICD-10-CM | POA: Diagnosis not present

## 2022-03-09 DIAGNOSIS — N186 End stage renal disease: Secondary | ICD-10-CM | POA: Diagnosis not present

## 2022-03-09 DIAGNOSIS — Z992 Dependence on renal dialysis: Secondary | ICD-10-CM | POA: Diagnosis not present

## 2022-03-09 DIAGNOSIS — N2581 Secondary hyperparathyroidism of renal origin: Secondary | ICD-10-CM | POA: Diagnosis not present

## 2022-03-11 DIAGNOSIS — N186 End stage renal disease: Secondary | ICD-10-CM | POA: Diagnosis not present

## 2022-03-11 DIAGNOSIS — E1122 Type 2 diabetes mellitus with diabetic chronic kidney disease: Secondary | ICD-10-CM | POA: Diagnosis not present

## 2022-03-11 DIAGNOSIS — Z992 Dependence on renal dialysis: Secondary | ICD-10-CM | POA: Diagnosis not present

## 2022-03-11 DIAGNOSIS — N2581 Secondary hyperparathyroidism of renal origin: Secondary | ICD-10-CM | POA: Diagnosis not present

## 2022-03-13 DIAGNOSIS — N186 End stage renal disease: Secondary | ICD-10-CM | POA: Diagnosis not present

## 2022-03-13 DIAGNOSIS — N2581 Secondary hyperparathyroidism of renal origin: Secondary | ICD-10-CM | POA: Diagnosis not present

## 2022-03-13 DIAGNOSIS — Z992 Dependence on renal dialysis: Secondary | ICD-10-CM | POA: Diagnosis not present

## 2022-03-17 DIAGNOSIS — N186 End stage renal disease: Secondary | ICD-10-CM | POA: Diagnosis not present

## 2022-03-17 DIAGNOSIS — Z992 Dependence on renal dialysis: Secondary | ICD-10-CM | POA: Diagnosis not present

## 2022-03-17 DIAGNOSIS — N2581 Secondary hyperparathyroidism of renal origin: Secondary | ICD-10-CM | POA: Diagnosis not present

## 2022-03-18 DIAGNOSIS — Z992 Dependence on renal dialysis: Secondary | ICD-10-CM | POA: Diagnosis not present

## 2022-03-18 DIAGNOSIS — N186 End stage renal disease: Secondary | ICD-10-CM | POA: Diagnosis not present

## 2022-03-18 DIAGNOSIS — N2581 Secondary hyperparathyroidism of renal origin: Secondary | ICD-10-CM | POA: Diagnosis not present

## 2022-03-20 DIAGNOSIS — N186 End stage renal disease: Secondary | ICD-10-CM | POA: Diagnosis not present

## 2022-03-20 DIAGNOSIS — Z992 Dependence on renal dialysis: Secondary | ICD-10-CM | POA: Diagnosis not present

## 2022-03-20 DIAGNOSIS — N2581 Secondary hyperparathyroidism of renal origin: Secondary | ICD-10-CM | POA: Diagnosis not present

## 2022-03-22 DIAGNOSIS — L820 Inflamed seborrheic keratosis: Secondary | ICD-10-CM | POA: Diagnosis not present

## 2022-03-22 DIAGNOSIS — L720 Epidermal cyst: Secondary | ICD-10-CM | POA: Diagnosis not present

## 2022-03-23 DIAGNOSIS — Z992 Dependence on renal dialysis: Secondary | ICD-10-CM | POA: Diagnosis not present

## 2022-03-23 DIAGNOSIS — N2581 Secondary hyperparathyroidism of renal origin: Secondary | ICD-10-CM | POA: Diagnosis not present

## 2022-03-23 DIAGNOSIS — N186 End stage renal disease: Secondary | ICD-10-CM | POA: Diagnosis not present

## 2022-03-23 DIAGNOSIS — Z23 Encounter for immunization: Secondary | ICD-10-CM | POA: Diagnosis not present

## 2022-03-25 DIAGNOSIS — Z23 Encounter for immunization: Secondary | ICD-10-CM | POA: Diagnosis not present

## 2022-03-25 DIAGNOSIS — N2581 Secondary hyperparathyroidism of renal origin: Secondary | ICD-10-CM | POA: Diagnosis not present

## 2022-03-25 DIAGNOSIS — N186 End stage renal disease: Secondary | ICD-10-CM | POA: Diagnosis not present

## 2022-03-25 DIAGNOSIS — Z992 Dependence on renal dialysis: Secondary | ICD-10-CM | POA: Diagnosis not present

## 2022-03-27 DIAGNOSIS — N2581 Secondary hyperparathyroidism of renal origin: Secondary | ICD-10-CM | POA: Diagnosis not present

## 2022-03-27 DIAGNOSIS — N186 End stage renal disease: Secondary | ICD-10-CM | POA: Diagnosis not present

## 2022-03-27 DIAGNOSIS — Z23 Encounter for immunization: Secondary | ICD-10-CM | POA: Diagnosis not present

## 2022-03-27 DIAGNOSIS — Z992 Dependence on renal dialysis: Secondary | ICD-10-CM | POA: Diagnosis not present

## 2022-03-29 ENCOUNTER — Other Ambulatory Visit: Payer: Self-pay | Admitting: Nephrology

## 2022-03-29 DIAGNOSIS — Z7682 Awaiting organ transplant status: Secondary | ICD-10-CM

## 2022-03-30 DIAGNOSIS — Z992 Dependence on renal dialysis: Secondary | ICD-10-CM | POA: Diagnosis not present

## 2022-03-30 DIAGNOSIS — N2581 Secondary hyperparathyroidism of renal origin: Secondary | ICD-10-CM | POA: Diagnosis not present

## 2022-03-30 DIAGNOSIS — N186 End stage renal disease: Secondary | ICD-10-CM | POA: Diagnosis not present

## 2022-04-01 DIAGNOSIS — N186 End stage renal disease: Secondary | ICD-10-CM | POA: Diagnosis not present

## 2022-04-01 DIAGNOSIS — N2581 Secondary hyperparathyroidism of renal origin: Secondary | ICD-10-CM | POA: Diagnosis not present

## 2022-04-01 DIAGNOSIS — Z992 Dependence on renal dialysis: Secondary | ICD-10-CM | POA: Diagnosis not present

## 2022-04-03 DIAGNOSIS — N186 End stage renal disease: Secondary | ICD-10-CM | POA: Diagnosis not present

## 2022-04-03 DIAGNOSIS — Z992 Dependence on renal dialysis: Secondary | ICD-10-CM | POA: Diagnosis not present

## 2022-04-03 DIAGNOSIS — N2581 Secondary hyperparathyroidism of renal origin: Secondary | ICD-10-CM | POA: Diagnosis not present

## 2022-04-05 NOTE — Progress Notes (Unsigned)
  Subjective:    [name] - 62 y.o. female MRN [id number]  Date of birth: [birth year]  HPI  [name] is to establish care.   Current issues and/or concerns: Established with [facility] J[facility] 08 Abdominal Organ Transplant (kidney) Cards - CAD, HTN, HLD   ROS per HPI     Health Maintenance:  Health Maintenance Due  Topic Date Due   COVID-19 Vaccine (1) Never done   OPHTHALMOLOGY EXAM  Never done   Zoster Vaccines- Shingrix (1 of 2) Never done   MAMMOGRAM  10/14/2019     Past Medical History: Patient Active Problem List   Diagnosis Date Noted   Cyst of skin 01/08/2022   Morbid obesity due to excess calories ([name]) 01/08/2022   Pain in right upper arm 01/08/2022   Skin lesion 01/08/2022   Annual physical exam 01/08/2022   Screening for cervical cancer 01/08/2022   Normocytic anemia 11/10/2021   Hospital discharge follow-up 11/10/2021   Hypertensive urgency 10/24/2021   CKD (chronic kidney disease), stage V ([name]) 10/24/2021   Dietary counseling and surveillance 03/12/2020   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   Complex renal cyst 11/29/2019   Acute on chronic diastolic heart failure ([name]) 11/13/2019   Edema 10/18/2019   Truncal obesity 10/18/2019   DM (diabetes mellitus), type 2 with renal complications (HCC)    Hx of adenomatous colonic polyps 06/11/2016   Diverticulosis 06/07/2016   Sleep apnea 03/18/2016   CRI (chronic renal insufficiency), stage 4 (severe) ([name]) 03/18/2016   Increased risk of breast cancer 12/17/2015   Hypertensive heart disease    Hematoma of groin - right, s/p cath; stable on discharge 09/02/2012   Pleuritic chest pain 08/31/2012   Obesity (BMI 30-39.9) 08/31/2012   CAD (coronary artery disease) 04/15/2011   Abnormal stress test, [name] myoview 03/25/2011-sm. anterior ischemia 04/15/2011   Essential hypertension 04/15/2011   Dyslipidemia- LDL goal < 70 [id number]   Metabolic syndrome  [id number]   Tobacco abuse 04/15/2011   Family history of premature CAD 04/15/2011      Social History   reports that she quit smoking about 5 months ago. Her smoking use included cigarettes. She has a 7.50 pack-year smoking history. She has never used smokeless tobacco. She reports that she does not drink alcohol and does not use drugs.   Family History  family history includes Cancer in her maternal grandmother; Coronary artery disease in her brother; Diabetes in her mother; Heart attack in her maternal grandfather; Heart attack (age of onset: 14) in her maternal aunt; Hypertension in her father and maternal grandfather; Stroke in her father and maternal grandmother.   Medications: reviewed and updated   Objective:   Physical Exam LMP 12/14/2013  Physical Exam      Assessment & Plan:         Patient was given clear instructions to go to Emergency Department or return to medical center if symptoms don't improve, worsen, or new problems develop.The patient verbalized understanding.  I discussed the assessment and treatment plan with the patient. The patient was provided an opportunity to ask questions and all were answered. The patient agreed with the plan and demonstrated an understanding of the instructions.   The patient was advised to call back or seek an in-person evaluation if the symptoms worsen or if the condition fails to improve as anticipated.    Durene Fruits, NP 04/05/2022, 12:42 PM Primary Care at [facility]

## 2022-04-06 DIAGNOSIS — N186 End stage renal disease: Secondary | ICD-10-CM | POA: Diagnosis not present

## 2022-04-06 DIAGNOSIS — Z992 Dependence on renal dialysis: Secondary | ICD-10-CM | POA: Diagnosis not present

## 2022-04-06 DIAGNOSIS — N2581 Secondary hyperparathyroidism of renal origin: Secondary | ICD-10-CM | POA: Diagnosis not present

## 2022-04-07 ENCOUNTER — Encounter: Payer: Self-pay | Admitting: Family

## 2022-04-07 ENCOUNTER — Ambulatory Visit (INDEPENDENT_AMBULATORY_CARE_PROVIDER_SITE_OTHER): Payer: BC Managed Care – PPO | Admitting: Family

## 2022-04-07 VITALS — BP 156/77 | HR 85 | Temp 98.3°F | Resp 16 | Ht 64.0 in | Wt 184.0 lb

## 2022-04-07 DIAGNOSIS — Z7689 Persons encountering health services in other specified circumstances: Secondary | ICD-10-CM

## 2022-04-07 DIAGNOSIS — Z0000 Encounter for general adult medical examination without abnormal findings: Secondary | ICD-10-CM | POA: Diagnosis not present

## 2022-04-07 NOTE — Patient Instructions (Signed)
Thank you for choosing Primary Care at [facility] for your medical home!    [name] was seen by Camillia Herter, NP today.   [name]'s primary care provider is Durene Fruits, NP.   For the best care possible,  you should try to see Durene Fruits, NP whenever you come to office.   We look forward to seeing you again soon!  If you have any questions about your visit today,  please call us at [phone]  Or feel free to reach your provider via [place].   Keeping you healthy   Get these tests Blood pressure- Have your blood pressure checked once a year by your healthcare provider.  Normal blood pressure is 120/80. Weight- Have your body mass index (BMI) calculated to screen for obesity.  BMI is a measure of body fat based on height and weight. You can also calculate your own BMI at [employer].it. Cholesterol- Have your cholesterol checked regularly starting at age 14, sooner may be necessary if you have diabetes, high blood pressure, if a family member developed heart diseases at an early age or if you smoke.  Chlamydia, HIV, and other sexual transmitted disease- Get screened each year until the age of 40 then within three months of each new sexual partner. Diabetes- Have your blood sugar checked regularly if you have high blood pressure, high cholesterol, a family history of diabetes or if you are overweight.   Get these vaccines Flu shot- Every fall. Tetanus shot- Every 10 years. Menactra- Single dose; prevents meningitis.   Take these steps Don't smoke- If you do smoke, ask your healthcare provider about quitting. For tips on how to quit, go to www.smokefree.gov or call 1-800-QUIT-NOW. Be physically active- Exercise 5 days a week for at least 30 minutes.  If you are not already physically active start slow and gradually work up to 30 minutes of moderate physical activity.  Examples of moderate activity include walking briskly, mowing the yard, dancing,  swimming bicycling, etc. Eat a healthy diet- Eat a variety of healthy foods such as fruits, vegetables, low fat milk, low fat cheese, yogurt, lean meats, poultry, fish, beans, tofu, etc.  For more information on healthy eating, go to www.thenutritionsource.org Drink alcohol in moderation- Limit alcohol intake two drinks or less a day.  Never drink and drive. Dentist- Brush and floss teeth twice daily; visit your dentis twice a year. Depression-Your emotional health is as important as your physical health.  If you're feeling down, losing interest in things you normally enjoy please talk with your healthcare provider. Gun Safety- If you keep a gun in your home, keep it unloaded and with the safety lock on.  Bullets should be stored separately. Helmet use- Always wear a helmet when riding a motorcycle, bicycle, rollerblading or skateboarding. Safe sex- If you may be exposed to a sexually transmitted infection, use a condom Seat belts- Seat bels can save your life; always wear one. Smoke/Carbon Monoxide detectors- These detectors need to be installed on the appropriate level of your home.  Replace batteries at least once a year. Skin Cancer- When out in the sun, cover up and use sunscreen SPF 15 or higher. Violence- If anyone is threatening or hurting you, please tell your healthcare provider.

## 2022-04-07 NOTE — Progress Notes (Signed)
.  Pt presents to establish care,  -PCP closer to home -no concerns today

## 2022-04-08 DIAGNOSIS — N2581 Secondary hyperparathyroidism of renal origin: Secondary | ICD-10-CM | POA: Diagnosis not present

## 2022-04-08 DIAGNOSIS — N186 End stage renal disease: Secondary | ICD-10-CM | POA: Diagnosis not present

## 2022-04-08 DIAGNOSIS — Z992 Dependence on renal dialysis: Secondary | ICD-10-CM | POA: Diagnosis not present

## 2022-04-10 DIAGNOSIS — N186 End stage renal disease: Secondary | ICD-10-CM | POA: Diagnosis not present

## 2022-04-10 DIAGNOSIS — Z992 Dependence on renal dialysis: Secondary | ICD-10-CM | POA: Diagnosis not present

## 2022-04-10 DIAGNOSIS — N2581 Secondary hyperparathyroidism of renal origin: Secondary | ICD-10-CM | POA: Diagnosis not present

## 2022-04-11 DIAGNOSIS — N186 End stage renal disease: Secondary | ICD-10-CM | POA: Diagnosis not present

## 2022-04-11 DIAGNOSIS — E1122 Type 2 diabetes mellitus with diabetic chronic kidney disease: Secondary | ICD-10-CM | POA: Diagnosis not present

## 2022-04-11 DIAGNOSIS — Z992 Dependence on renal dialysis: Secondary | ICD-10-CM | POA: Diagnosis not present

## 2022-04-13 DIAGNOSIS — N186 End stage renal disease: Secondary | ICD-10-CM | POA: Diagnosis not present

## 2022-04-13 DIAGNOSIS — N2581 Secondary hyperparathyroidism of renal origin: Secondary | ICD-10-CM | POA: Diagnosis not present

## 2022-04-13 DIAGNOSIS — Z992 Dependence on renal dialysis: Secondary | ICD-10-CM | POA: Diagnosis not present

## 2022-04-15 DIAGNOSIS — N2581 Secondary hyperparathyroidism of renal origin: Secondary | ICD-10-CM | POA: Diagnosis not present

## 2022-04-15 DIAGNOSIS — Z992 Dependence on renal dialysis: Secondary | ICD-10-CM | POA: Diagnosis not present

## 2022-04-15 DIAGNOSIS — N186 End stage renal disease: Secondary | ICD-10-CM | POA: Diagnosis not present

## 2022-04-17 DIAGNOSIS — N186 End stage renal disease: Secondary | ICD-10-CM | POA: Diagnosis not present

## 2022-04-17 DIAGNOSIS — N2581 Secondary hyperparathyroidism of renal origin: Secondary | ICD-10-CM | POA: Diagnosis not present

## 2022-04-17 DIAGNOSIS — Z992 Dependence on renal dialysis: Secondary | ICD-10-CM | POA: Diagnosis not present

## 2022-04-20 DIAGNOSIS — Z23 Encounter for immunization: Secondary | ICD-10-CM | POA: Diagnosis not present

## 2022-04-20 DIAGNOSIS — N186 End stage renal disease: Secondary | ICD-10-CM | POA: Diagnosis not present

## 2022-04-20 DIAGNOSIS — N2581 Secondary hyperparathyroidism of renal origin: Secondary | ICD-10-CM | POA: Diagnosis not present

## 2022-04-20 DIAGNOSIS — Z992 Dependence on renal dialysis: Secondary | ICD-10-CM | POA: Diagnosis not present

## 2022-04-22 DIAGNOSIS — Z23 Encounter for immunization: Secondary | ICD-10-CM | POA: Diagnosis not present

## 2022-04-22 DIAGNOSIS — N186 End stage renal disease: Secondary | ICD-10-CM | POA: Diagnosis not present

## 2022-04-22 DIAGNOSIS — Z992 Dependence on renal dialysis: Secondary | ICD-10-CM | POA: Diagnosis not present

## 2022-04-22 DIAGNOSIS — N2581 Secondary hyperparathyroidism of renal origin: Secondary | ICD-10-CM | POA: Diagnosis not present

## 2022-04-23 ENCOUNTER — Other Ambulatory Visit: Payer: BC Managed Care – PPO

## 2022-04-24 DIAGNOSIS — Z23 Encounter for immunization: Secondary | ICD-10-CM | POA: Diagnosis not present

## 2022-04-24 DIAGNOSIS — Z992 Dependence on renal dialysis: Secondary | ICD-10-CM | POA: Diagnosis not present

## 2022-04-24 DIAGNOSIS — N2581 Secondary hyperparathyroidism of renal origin: Secondary | ICD-10-CM | POA: Diagnosis not present

## 2022-04-24 DIAGNOSIS — N186 End stage renal disease: Secondary | ICD-10-CM | POA: Diagnosis not present

## 2022-04-26 DIAGNOSIS — Q613 Polycystic kidney, unspecified: Secondary | ICD-10-CM | POA: Diagnosis not present

## 2022-04-26 DIAGNOSIS — K5730 Diverticulosis of large intestine without perforation or abscess without bleeding: Secondary | ICD-10-CM | POA: Diagnosis not present

## 2022-04-26 DIAGNOSIS — I3139 Other pericardial effusion (noninflammatory): Secondary | ICD-10-CM | POA: Diagnosis not present

## 2022-04-26 DIAGNOSIS — Z01818 Encounter for other preprocedural examination: Secondary | ICD-10-CM | POA: Diagnosis not present

## 2022-04-27 DIAGNOSIS — N186 End stage renal disease: Secondary | ICD-10-CM | POA: Diagnosis not present

## 2022-04-27 DIAGNOSIS — N2581 Secondary hyperparathyroidism of renal origin: Secondary | ICD-10-CM | POA: Diagnosis not present

## 2022-04-27 DIAGNOSIS — Z992 Dependence on renal dialysis: Secondary | ICD-10-CM | POA: Diagnosis not present

## 2022-04-29 DIAGNOSIS — Z992 Dependence on renal dialysis: Secondary | ICD-10-CM | POA: Diagnosis not present

## 2022-04-29 DIAGNOSIS — N186 End stage renal disease: Secondary | ICD-10-CM | POA: Diagnosis not present

## 2022-04-29 DIAGNOSIS — N2581 Secondary hyperparathyroidism of renal origin: Secondary | ICD-10-CM | POA: Diagnosis not present

## 2022-05-01 DIAGNOSIS — Z992 Dependence on renal dialysis: Secondary | ICD-10-CM | POA: Diagnosis not present

## 2022-05-01 DIAGNOSIS — N186 End stage renal disease: Secondary | ICD-10-CM | POA: Diagnosis not present

## 2022-05-01 DIAGNOSIS — N2581 Secondary hyperparathyroidism of renal origin: Secondary | ICD-10-CM | POA: Diagnosis not present

## 2022-05-04 DIAGNOSIS — Z992 Dependence on renal dialysis: Secondary | ICD-10-CM | POA: Diagnosis not present

## 2022-05-04 DIAGNOSIS — N2581 Secondary hyperparathyroidism of renal origin: Secondary | ICD-10-CM | POA: Diagnosis not present

## 2022-05-04 DIAGNOSIS — N186 End stage renal disease: Secondary | ICD-10-CM | POA: Diagnosis not present

## 2022-05-05 DIAGNOSIS — N186 End stage renal disease: Secondary | ICD-10-CM | POA: Diagnosis not present

## 2022-05-05 DIAGNOSIS — I120 Hypertensive chronic kidney disease with stage 5 chronic kidney disease or end stage renal disease: Secondary | ICD-10-CM | POA: Diagnosis not present

## 2022-05-05 DIAGNOSIS — Z992 Dependence on renal dialysis: Secondary | ICD-10-CM | POA: Diagnosis not present

## 2022-05-06 DIAGNOSIS — N186 End stage renal disease: Secondary | ICD-10-CM | POA: Diagnosis not present

## 2022-05-06 DIAGNOSIS — N2581 Secondary hyperparathyroidism of renal origin: Secondary | ICD-10-CM | POA: Diagnosis not present

## 2022-05-06 DIAGNOSIS — Z992 Dependence on renal dialysis: Secondary | ICD-10-CM | POA: Diagnosis not present

## 2022-05-08 DIAGNOSIS — N2581 Secondary hyperparathyroidism of renal origin: Secondary | ICD-10-CM | POA: Diagnosis not present

## 2022-05-08 DIAGNOSIS — Z992 Dependence on renal dialysis: Secondary | ICD-10-CM | POA: Diagnosis not present

## 2022-05-08 DIAGNOSIS — N186 End stage renal disease: Secondary | ICD-10-CM | POA: Diagnosis not present

## 2022-05-11 DIAGNOSIS — N2581 Secondary hyperparathyroidism of renal origin: Secondary | ICD-10-CM | POA: Diagnosis not present

## 2022-05-11 DIAGNOSIS — E1122 Type 2 diabetes mellitus with diabetic chronic kidney disease: Secondary | ICD-10-CM | POA: Diagnosis not present

## 2022-05-11 DIAGNOSIS — N186 End stage renal disease: Secondary | ICD-10-CM | POA: Diagnosis not present

## 2022-05-11 DIAGNOSIS — Z992 Dependence on renal dialysis: Secondary | ICD-10-CM | POA: Diagnosis not present

## 2022-05-13 DIAGNOSIS — N2581 Secondary hyperparathyroidism of renal origin: Secondary | ICD-10-CM | POA: Diagnosis not present

## 2022-05-13 DIAGNOSIS — Z992 Dependence on renal dialysis: Secondary | ICD-10-CM | POA: Diagnosis not present

## 2022-05-13 DIAGNOSIS — N186 End stage renal disease: Secondary | ICD-10-CM | POA: Diagnosis not present

## 2022-05-15 DIAGNOSIS — N186 End stage renal disease: Secondary | ICD-10-CM | POA: Diagnosis not present

## 2022-05-15 DIAGNOSIS — N2581 Secondary hyperparathyroidism of renal origin: Secondary | ICD-10-CM | POA: Diagnosis not present

## 2022-05-15 DIAGNOSIS — Z992 Dependence on renal dialysis: Secondary | ICD-10-CM | POA: Diagnosis not present

## 2022-05-18 DIAGNOSIS — Z992 Dependence on renal dialysis: Secondary | ICD-10-CM | POA: Diagnosis not present

## 2022-05-18 DIAGNOSIS — N186 End stage renal disease: Secondary | ICD-10-CM | POA: Diagnosis not present

## 2022-05-20 DIAGNOSIS — Z992 Dependence on renal dialysis: Secondary | ICD-10-CM | POA: Diagnosis not present

## 2022-05-20 DIAGNOSIS — N186 End stage renal disease: Secondary | ICD-10-CM | POA: Diagnosis not present

## 2022-05-21 DIAGNOSIS — I871 Compression of vein: Secondary | ICD-10-CM | POA: Diagnosis not present

## 2022-05-21 DIAGNOSIS — N186 End stage renal disease: Secondary | ICD-10-CM | POA: Diagnosis not present

## 2022-05-21 DIAGNOSIS — T82858A Stenosis of vascular prosthetic devices, implants and grafts, initial encounter: Secondary | ICD-10-CM | POA: Diagnosis not present

## 2022-05-21 DIAGNOSIS — Z992 Dependence on renal dialysis: Secondary | ICD-10-CM | POA: Diagnosis not present

## 2022-05-22 DIAGNOSIS — N2581 Secondary hyperparathyroidism of renal origin: Secondary | ICD-10-CM | POA: Diagnosis not present

## 2022-05-22 DIAGNOSIS — Z992 Dependence on renal dialysis: Secondary | ICD-10-CM | POA: Diagnosis not present

## 2022-05-22 DIAGNOSIS — N186 End stage renal disease: Secondary | ICD-10-CM | POA: Diagnosis not present

## 2022-05-25 DIAGNOSIS — N2581 Secondary hyperparathyroidism of renal origin: Secondary | ICD-10-CM | POA: Diagnosis not present

## 2022-05-25 DIAGNOSIS — N186 End stage renal disease: Secondary | ICD-10-CM | POA: Diagnosis not present

## 2022-05-25 DIAGNOSIS — Z992 Dependence on renal dialysis: Secondary | ICD-10-CM | POA: Diagnosis not present

## 2022-05-25 DIAGNOSIS — Z23 Encounter for immunization: Secondary | ICD-10-CM | POA: Diagnosis not present

## 2022-05-27 DIAGNOSIS — Z23 Encounter for immunization: Secondary | ICD-10-CM | POA: Diagnosis not present

## 2022-05-27 DIAGNOSIS — N186 End stage renal disease: Secondary | ICD-10-CM | POA: Diagnosis not present

## 2022-05-27 DIAGNOSIS — N2581 Secondary hyperparathyroidism of renal origin: Secondary | ICD-10-CM | POA: Diagnosis not present

## 2022-05-27 DIAGNOSIS — Z992 Dependence on renal dialysis: Secondary | ICD-10-CM | POA: Diagnosis not present

## 2022-05-29 DIAGNOSIS — N186 End stage renal disease: Secondary | ICD-10-CM | POA: Diagnosis not present

## 2022-05-29 DIAGNOSIS — N2581 Secondary hyperparathyroidism of renal origin: Secondary | ICD-10-CM | POA: Diagnosis not present

## 2022-05-29 DIAGNOSIS — Z992 Dependence on renal dialysis: Secondary | ICD-10-CM | POA: Diagnosis not present

## 2022-05-29 DIAGNOSIS — Z23 Encounter for immunization: Secondary | ICD-10-CM | POA: Diagnosis not present

## 2022-06-01 DIAGNOSIS — Z992 Dependence on renal dialysis: Secondary | ICD-10-CM | POA: Diagnosis not present

## 2022-06-01 DIAGNOSIS — N2581 Secondary hyperparathyroidism of renal origin: Secondary | ICD-10-CM | POA: Diagnosis not present

## 2022-06-01 DIAGNOSIS — N186 End stage renal disease: Secondary | ICD-10-CM | POA: Diagnosis not present

## 2022-06-03 DIAGNOSIS — Z992 Dependence on renal dialysis: Secondary | ICD-10-CM | POA: Diagnosis not present

## 2022-06-03 DIAGNOSIS — N186 End stage renal disease: Secondary | ICD-10-CM | POA: Diagnosis not present

## 2022-06-03 DIAGNOSIS — N2581 Secondary hyperparathyroidism of renal origin: Secondary | ICD-10-CM | POA: Diagnosis not present

## 2022-06-05 DIAGNOSIS — N186 End stage renal disease: Secondary | ICD-10-CM | POA: Diagnosis not present

## 2022-06-05 DIAGNOSIS — N2581 Secondary hyperparathyroidism of renal origin: Secondary | ICD-10-CM | POA: Diagnosis not present

## 2022-06-05 DIAGNOSIS — Z992 Dependence on renal dialysis: Secondary | ICD-10-CM | POA: Diagnosis not present

## 2022-06-06 IMAGING — US US RENAL
1 series · 13 of 25 positions shown · non-contrast
Comparison: 09/20/2017

CLINICAL DATA: Stage IV chronic renal insufficiency

EXAM:
RENAL / URINARY TRACT ULTRASOUND COMPLETE

[Series 1: us renal · 0.23mm/px · 13 of 49 slices shown]
[im 1/49]
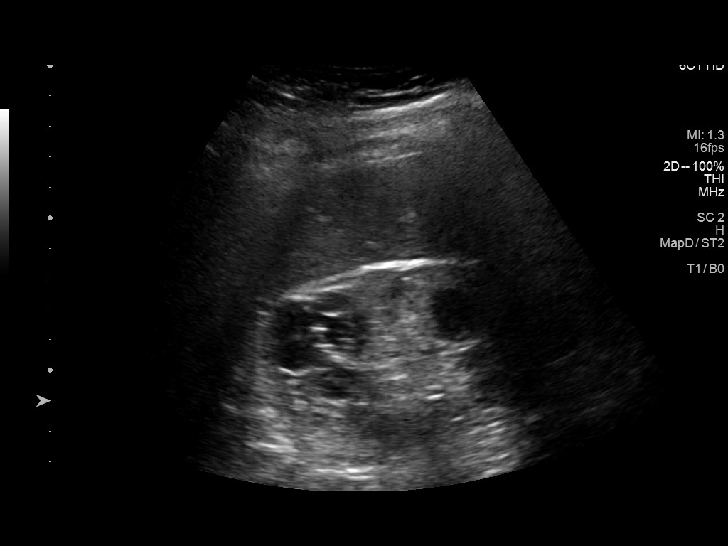
[im 5/49]
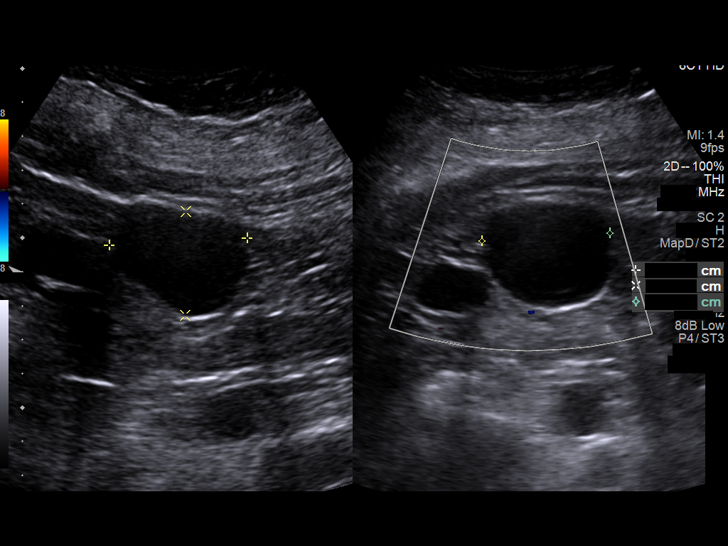
[im 9/49]
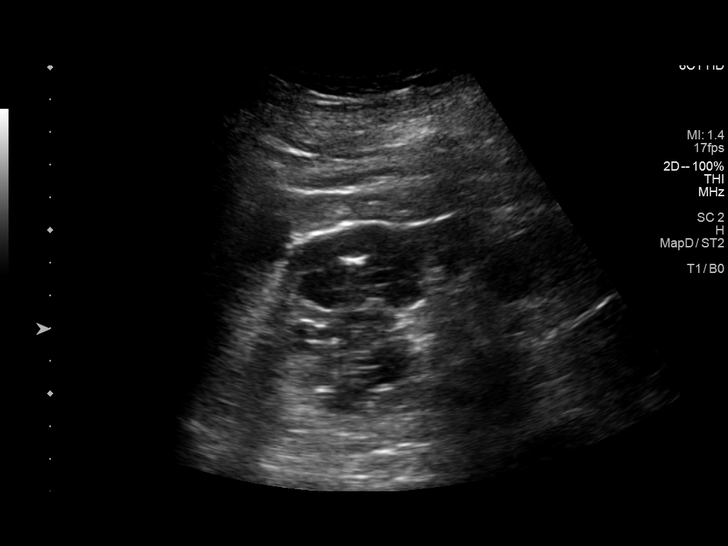
[im 13/49]
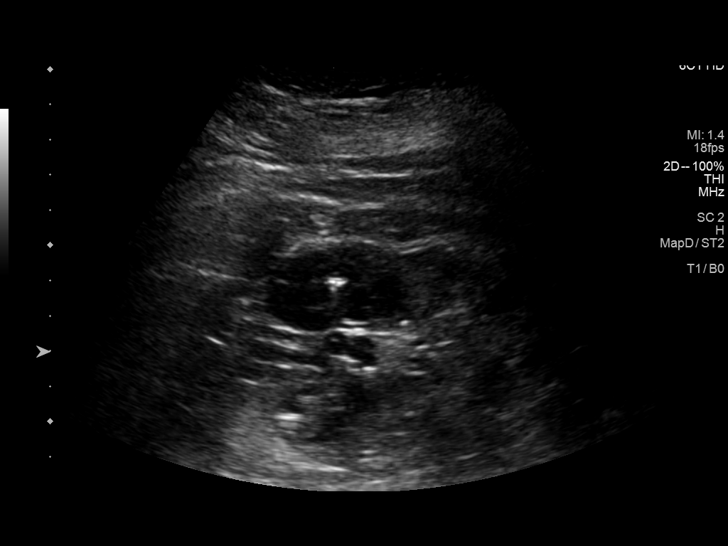
[im 17/49]
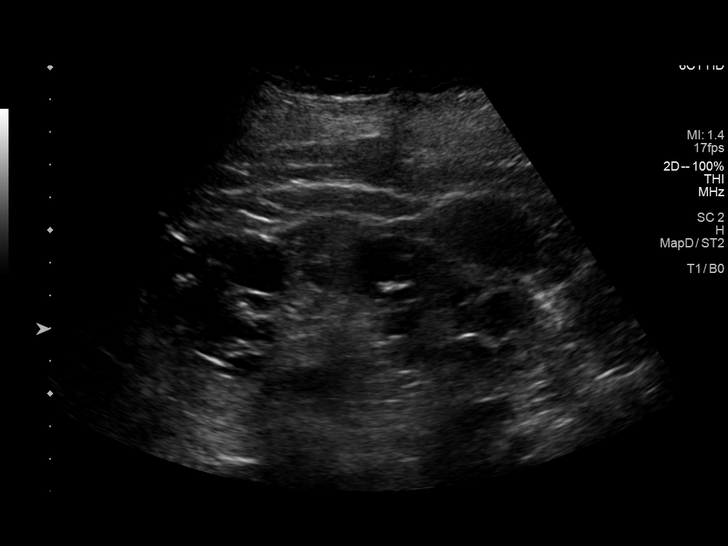
[im 21/49]
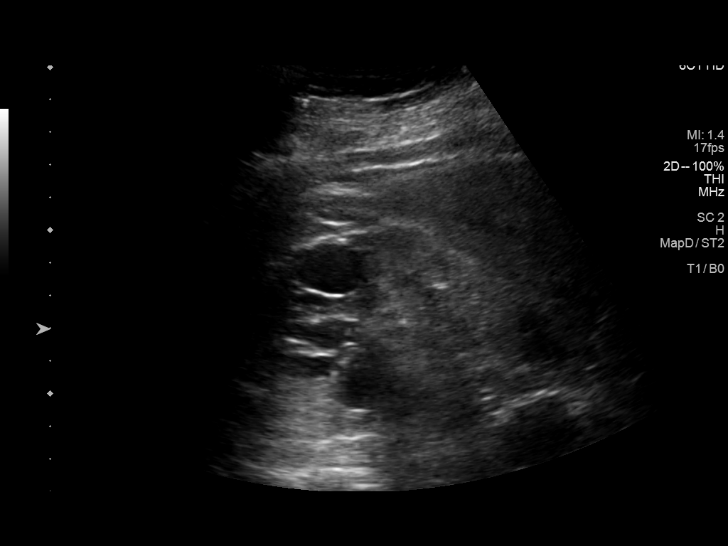
[im 25/49]
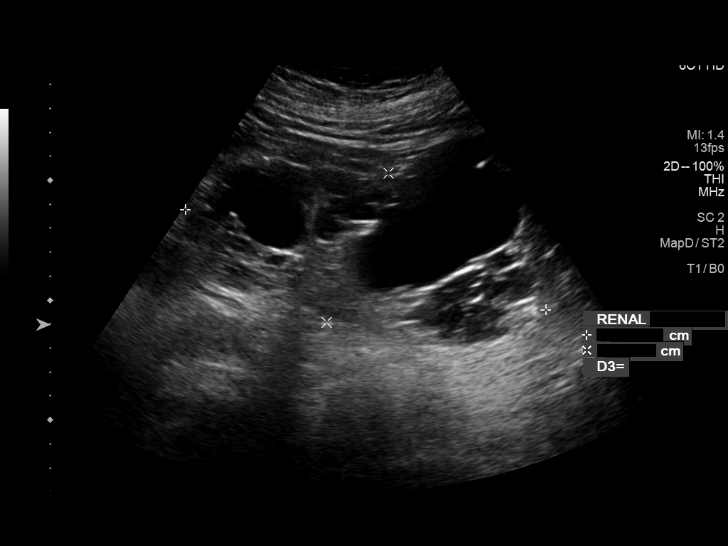
[im 29/49]
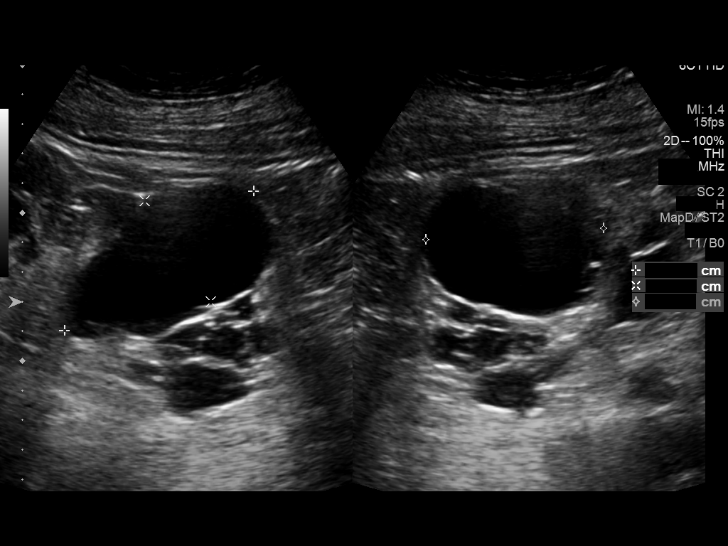
[im 33/49]
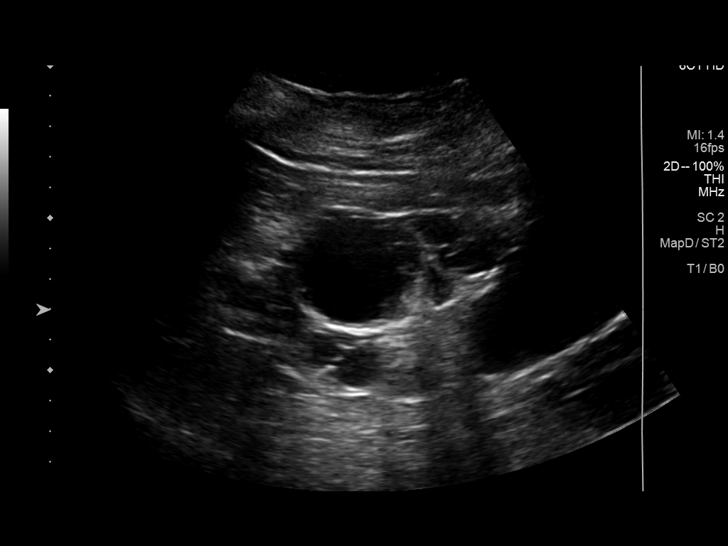
[im 37/49]
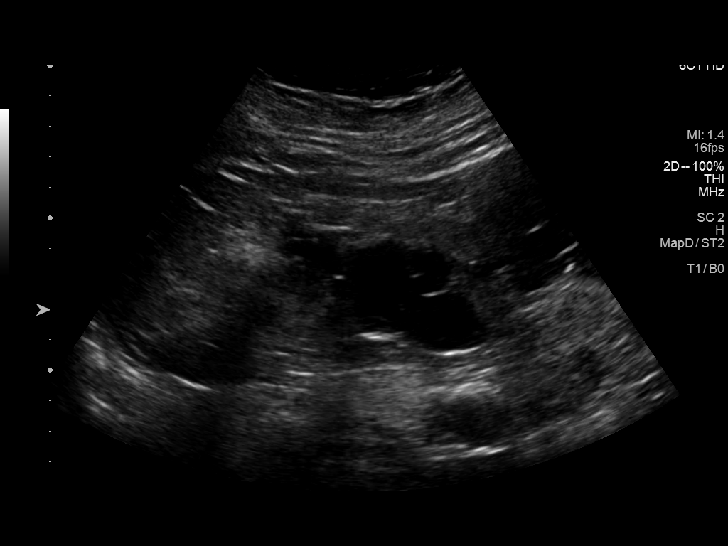
[im 41/49]
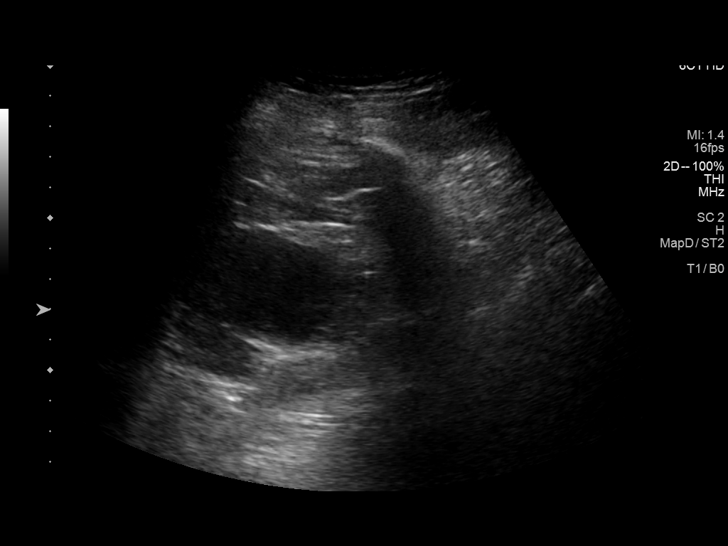
[im 45/49]
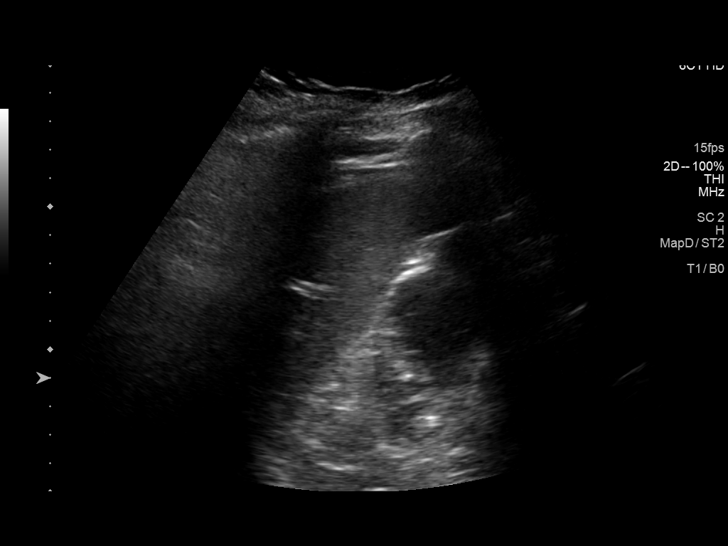
[im 49/49]
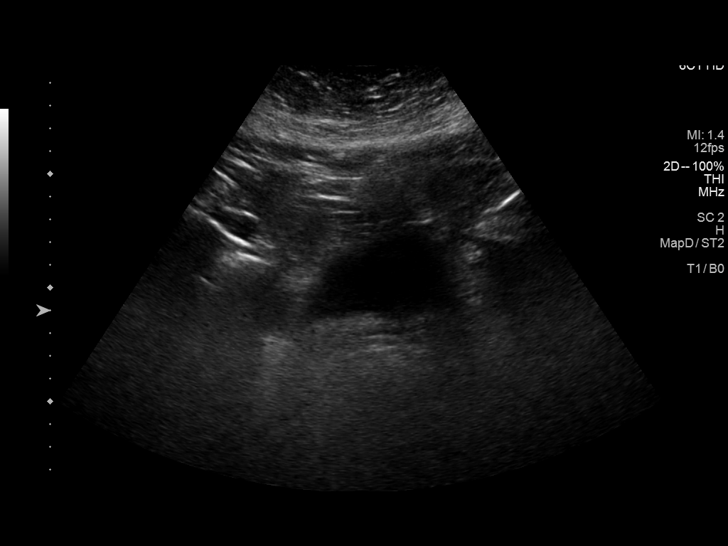

[13 of 25 positions shown; findings below may reference images not displayed]

FINDINGS: Right Kidney:

Renal measurements: 13.4 x 5.6 x 5.5 cm = volume: 213.1 mL. There is
diffuse increased renal cortical echotexture consistent with medical
renal disease. Multiple renal cysts are identified. Complex cyst
within the lower pole with mural thickening and internal septation
measures 3.9 x 3.0 x 4.4 cm, not appreciably changed since prior
study. Indeterminate lesion upper pole right kidney may reflect a
solid lesion or complex cyst, measuring 5.0 x 1.7 x 4.9 cm,
previously having measured 4.1 x 2.1 x 2.5 cm. Dedicated renal MRI
recommended for complete characterization, given characteristics and
increase in size.

Left Kidney:

Renal measurements: 15.6 x 6.7 x 6.4 cm = volume: 350.9 mL.
Echogenicity is increased consistent with medical renal disease.
Multiple left renal cysts are identified, largest in the lower pole
measuring 7.9 x 4.1 x 6.0 cm demonstrating minimal lobularity and
decreased in size since prior exam. No solid renal lesions are
identified.

Bladder:

Minimally distended, limiting evaluation.

Other:

None.
IMPRESSION: 1. Indeterminate hypoechoic 5.0 cm lesion within the upper pole
right kidney, increased in size since prior study. Dedicated renal
MRI is recommended for further evaluation.
2. Other bilateral renal cortical cysts, stable or smaller since
prior study.
3. Marked increased renal cortical echotexture consistent with
medical renal disease.

These results will be called to the ordering clinician or
representative by the Radiologist Assistant, and communication
documented in the PACS or [REDACTED].

## 2022-06-08 DIAGNOSIS — N186 End stage renal disease: Secondary | ICD-10-CM | POA: Diagnosis not present

## 2022-06-08 DIAGNOSIS — N2581 Secondary hyperparathyroidism of renal origin: Secondary | ICD-10-CM | POA: Diagnosis not present

## 2022-06-08 DIAGNOSIS — Z992 Dependence on renal dialysis: Secondary | ICD-10-CM | POA: Diagnosis not present

## 2022-06-11 DIAGNOSIS — Z992 Dependence on renal dialysis: Secondary | ICD-10-CM | POA: Diagnosis not present

## 2022-06-11 DIAGNOSIS — N2581 Secondary hyperparathyroidism of renal origin: Secondary | ICD-10-CM | POA: Diagnosis not present

## 2022-06-11 DIAGNOSIS — N186 End stage renal disease: Secondary | ICD-10-CM | POA: Diagnosis not present

## 2022-06-11 DIAGNOSIS — E1122 Type 2 diabetes mellitus with diabetic chronic kidney disease: Secondary | ICD-10-CM | POA: Diagnosis not present

## 2022-06-15 DIAGNOSIS — N186 End stage renal disease: Secondary | ICD-10-CM | POA: Diagnosis not present

## 2022-06-15 DIAGNOSIS — Z992 Dependence on renal dialysis: Secondary | ICD-10-CM | POA: Diagnosis not present

## 2022-06-15 DIAGNOSIS — N2581 Secondary hyperparathyroidism of renal origin: Secondary | ICD-10-CM | POA: Diagnosis not present

## 2022-06-15 NOTE — Progress Notes (Deleted)
Patient ID: [name], female    DOB: 04/05/1960  MRN: [id number]  CC: Annual Physical Exam  Subjective: [name] is a 62 y.o. female who presents for annual physical exam.   Her concerns today include:  Organ Transplant/kidney Cards  Mammo  History of diabetes - confirm   Patient Active Problem List   Diagnosis Date Noted   Encounter for immunization 02/23/2022   Type 2 diabetes mellitus (HCC) 02/04/2022   GERD (gastroesophageal reflux disease) 02/04/2022   Cyst of skin 01/08/2022   Morbid obesity due to excess calories (HCC) 01/08/2022   Pain in right upper arm 01/08/2022   Skin lesion 01/08/2022   Pre-transplant evaluation for kidney transplant 01/08/2022   Screening for cervical cancer 01/08/2022   Hospital discharge follow-up 11/10/2021   Encounter for screening for respiratory tuberculosis 10/31/2021   Pruritus, unspecified 10/31/2021   Iron deficiency anemia, unspecified 10/31/2021   Other disorders of phosphorus metabolism 10/31/2021   Pain, unspecified 10/31/2021   Secondary hyperparathyroidism of renal origin (HCC) 10/31/2021   End stage renal disease (HCC) 10/31/2021   Hypercalcemia 10/31/2021   Coagulation defect, unspecified (HCC) 10/31/2021   Diarrhea, unspecified 10/31/2021   Dyspnea, unspecified 10/31/2021   Anaphylactic shock, unspecified, initial encounter 10/31/2021   Allergy, unspecified, initial encounter 10/31/2021   Anemia in chronic kidney disease 10/29/2021   Chronic diastolic (congestive) heart failure (HCC) 10/29/2021   Atherosclerotic heart disease of native coronary artery without angina pectoris 10/29/2021   Polycystic kidney, unspecified 10/29/2021   Type 2 diabetes mellitus with diabetic chronic kidney disease (HCC) 10/29/2021   Long term (current) use of aspirin 10/29/2021   Personal history of nicotine dependence 10/29/2021   Hyperlipidemia 10/29/2021   Hypertensive heart and chronic kidney disease with heart failure  and with stage 5 chronic kidney disease, or end stage renal disease (HCC) 10/29/2021   COPD (chronic obstructive pulmonary disease) (HCC) 10/29/2021   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) 10/29/2021   Hypertensive urgency 10/24/2021   CKD (chronic kidney disease), stage V (HCC) 10/24/2021   Hypertensive heart disease 08/13/2020   Diabetes mellitus (HCC) 08/13/2020   Dietary counseling and surveillance 03/12/2020   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   Complex renal cyst 11/29/2019   Acute on chronic diastolic heart failure (HCC) 11/13/2019   Edema 10/18/2019   Truncal obesity 10/18/2019   DM (diabetes mellitus), type 2 with renal complications (HCC)    Hx of adenomatous colonic polyps 06/11/2016   Diverticulosis 06/07/2016   Sleep apnea 03/18/2016   CRI (chronic renal insufficiency), stage 4 (severe) (HCC) 03/18/2016   Increased risk of breast cancer 12/17/2015   Hematoma of groin - right, s/p cath; stable on discharge 09/02/2012   Angina pectoris, unspecified (HCC) 08/31/2012   Obesity, Class III, BMI 40-49.9 (morbid obesity) (HCC) 08/31/2012   CAD (coronary artery disease) 04/15/2011   Abnormal stress test, Lexiscan myoview 03/25/2011-sm. anterior ischemia 04/15/2011   Essential hypertension 04/15/2011   Dyslipidemia 04/15/2011   Metabolic syndrome 04/15/2011   Tobacco abuse 04/15/2011   Family history of premature CAD 04/15/2011   Coronary atherosclerosis 04/15/2011     Current Outpatient Medications on File Prior to Visit  Medication Sig Dispense Refill   amLODipine (NORVASC) 10 MG tablet Take 1 tablet (10 mg total) by mouth daily. 30 tablet 0   carvedilol (COREG) 12.5 MG tablet Take 1 tablet (12.5 mg total) by mouth 2 (two) times daily with a meal. 60 tablet 6   hydrALAZINE (APRESOLINE) 50 MG tablet Take 50  mg by mouth 2 (two) times daily.     sevelamer carbonate (RENVELA) 800 MG tablet Take 1,600 mg by mouth 3 (three) times daily with meals.     No current  facility-administered medications on file prior to visit.    Allergies  Allergen Reactions   Bupropion Other (See Comments) and Nausea And Vomiting    Suicidal thoughts and increased depression    Suicidal thoughts and increased depression unknown Other reaction(s): Other Suicidal thoughts and increased depression Suicidal thoughts and increased depression  Suicidal thoughts and increased depression, unknown, Other reaction(s): Other, Suicidal thoughts and increased depression, Suicidal thoughts and increased depression   Doxazosin Mesylate     Increased heart rate   Ezetimibe Nausea And Vomiting    myalgias  myalgias, myalgias   Rosuvastatin Other (See Comments) and Nausea And Vomiting    myalgia  myalgia, myalgia   Simvastatin Nausea And Vomiting    myalgias  myalgias, myalgias   Wellbutrin [Bupropion Hcl] Other (See Comments)    Suicidal thoughts and increased depression   Doxazosin Nausea And Vomiting and Palpitations    Increased heart rate    Social History   Socioeconomic History   Marital status: Divorced    Spouse name: Not on file   Number of children: 1   Years of education: Not on file   Highest education level: Not on file  Occupational History   Occupation: Geophysicist/field seismologist: [employer]  Tobacco Use   Smoking status: Former    Packs/day: 0.25    Years: 30.00    Additional pack years: 0.00    Total pack years: 7.50    Types: Cigarettes    Quit date: 10/23/2021    Years since quitting: 0.6   Smokeless tobacco: Never   Tobacco comments:    2-  weeks  Vaping Use   Vaping Use: Never used  Substance and Sexual Activity   Alcohol use: No    Alcohol/week: 0.0 standard drinks of alcohol   Drug use: No   Sexual activity: Not Currently    Partners: Male  Other Topics Concern   Not on file  Social History Narrative   Lives with her son and her aunt lives with them as well.    Social Determinants of Health   Financial Resource Strain:  Medium Risk (10/24/2017)   Overall Financial Resource Strain (CARDIA)    Difficulty of Paying Living Expenses: Somewhat hard  Food Insecurity: No Food Insecurity (10/25/2021)   Hunger Vital Sign    Worried About Running Out of Food in the Last Year: Never true    Ran Out of Food in the Last Year: Never true  Transportation Needs: No Transportation Needs (10/25/2021)   PRAPARE - Administrator, Civil Service (Medical): No    Lack of Transportation (Non-Medical): No  Physical Activity: Inactive (10/24/2017)   Exercise Vital Sign    Days of Exercise per Week: 0 days    Minutes of Exercise per Session: 0 min  Stress: Stress Concern Present (10/24/2017)   [employer] of Occupational Health - Occupational Stress Questionnaire    Feeling of Stress : To some extent  Social Connections: Somewhat Isolated (10/24/2017)   Social Connection and Isolation Panel [NHANES]    Frequency of Communication with Friends and Family: More than three times a week    Frequency of Social Gatherings with Friends and Family: Once a week    Attends Religious Services: 1 to 4 times per  year    Active Member of Clubs or Organizations: No    Attends Banker Meetings: Never    Marital Status: Divorced  Catering manager Violence: Not At Risk (10/25/2021)   Humiliation, Afraid, Rape, and Kick questionnaire    Fear of Current or Ex-Partner: No    Emotionally Abused: No    Physically Abused: No    Sexually Abused: No    Family History  Problem Relation Age of Onset   Stroke Father    Hypertension Father    Coronary artery disease Brother    Diabetes Mother    Stroke Maternal Grandmother    Cancer Maternal Grandmother        Breast cancer   Hypertension Maternal Grandfather    Heart attack Maternal Grandfather    Heart attack Maternal Aunt 80   Colon cancer Neg Hx    Esophageal cancer Neg Hx    Stomach cancer Neg Hx    Rectal cancer Neg Hx     Past Surgical History:   Procedure Laterality Date   APPENDECTOMY  1974   AV FISTULA PLACEMENT Left 02/19/2020   Procedure: LEFT ARM BRACHIOCEPHALIC  ARTERIOVENOUS (AV) FISTULA CREATION;  Surgeon: Maeola Harman, MD;  Location: [facility] OR;  Service: Vascular;  Laterality: Left;   CARDIAC CATHETERIZATION  02/07/2008   Recommendation-F/U stress test   CARDIAC CATHETERIZATION  04/16/2011   Recommendation-Increase medical therapy trial   CARDIOVASCULAR STRESS TEST  03/25/2011   Suspect subtle anterior septal ischemia   COLONOSCOPY     FISTULA SUPERFICIALIZATION Left 05/13/2020   Procedure: FISTULA SUPERFICIALIZATION LEFT;  Surgeon: Maeola Harman, MD;  Location: [facility] OR;  Service: Vascular;  Laterality: Left;   FRACTURE SURGERY Left 1972   forearm fracture   LEFT HEART CATHETERIZATION WITH CORONARY ANGIOGRAM N/A 04/16/2011   Procedure: LEFT HEART CATHETERIZATION WITH CORONARY ANGIOGRAM;  Surgeon: Lennette Bihari, MD;  Location: [facility] CATH LAB;  Service: Cardiovascular;  Laterality: N/A;   LEFT HEART CATHETERIZATION WITH CORONARY ANGIOGRAM N/A 09/01/2012   Procedure: LEFT HEART CATHETERIZATION WITH CORONARY ANGIOGRAM;  Surgeon: Chrystie Nose, MD;  Location: [facility] CATH LAB;  Service: Cardiovascular;  Laterality: N/A;   POLYPECTOMY     RENAL DOPPLER  06/05/2009   No evidence of significant diameter reduction   TRANSTHORACIC ECHOCARDIOGRAM  01/25/2008   EF 60-65%, Moderate concentric LVH   WRIST FRACTURE SURGERY Right ~ 1996    ROS: Review of Systems Negative except as stated above  PHYSICAL EXAM: LMP 12/14/2013   Physical Exam  {female adult master:310786} {female adult master:310785}     Latest Ref Rng & Units 10/29/2021   12:33 AM 10/28/2021   10:00 AM 10/26/2021   12:09 PM  CMP  Glucose 70 - 99 mg/dL 161  096  045   BUN 6 - 20 mg/dL 51  37  72   Creatinine 0.44 - 1.00 mg/dL 4.09  8.11  91.47   Sodium 135 - 145 mmol/L 136  137  138   Potassium 3.5 - 5.1 mmol/L 4.1  3.4  3.5   Chloride 98 - 111  mmol/L 90  95  98   CO2 22 - 32 mmol/L 29  29  27    Calcium 8.9 - 10.3 mg/dL 8.3  8.4  7.9   Total Protein 6.5 - 8.1 g/dL 6.4  6.4    Total Bilirubin 0.3 - 1.2 mg/dL 0.3  0.2    Alkaline Phos 38 - 126 U/L 32  32  AST 15 - 41 U/L 19  22    ALT 0 - 44 U/L 18  19     Lipid Panel     Component Value Date/Time   CHOL 183 10/05/2018 0923   TRIG 251 (H) 10/05/2018 0923   HDL 29 (L) 10/05/2018 0923   CHOLHDL 6.3 (H) 10/05/2018 0923   CHOLHDL 7.0 (H) 04/01/2016 0847   VLDL 52 (H) 04/01/2016 0847   LDLCALC 110 (H) 10/05/2018 0923    CBC    Component Value Date/Time   WBC 10.2 10/29/2021 0033   RBC 3.04 (L) 10/29/2021 0033   HGB 8.8 (L) 10/29/2021 0033   HGB 11.1 08/10/2020 1512   HCT 28.2 (L) 10/29/2021 0033   HCT 33.8 (L) 08/10/2020 1512   PLT 212 10/29/2021 0033   PLT 226 08/10/2020 1512   MCV 92.8 10/29/2021 0033   MCV 87 08/10/2020 1512   MCH 28.9 10/29/2021 0033   MCHC 31.2 10/29/2021 0033   RDW 15.1 10/29/2021 0033   RDW 13.5 08/10/2020 1512   LYMPHSABS 1.4 10/24/2021 1738   LYMPHSABS 2.0 08/10/2020 1512   MONOABS 0.7 10/24/2021 1738   EOSABS 0.5 10/24/2021 1738   EOSABS 0.5 (H) 08/10/2020 1512   BASOSABS 0.1 10/24/2021 1738   BASOSABS 0.1 08/10/2020 1512    ASSESSMENT AND PLAN:  There are no diagnoses linked to this encounter.   Patient was given the opportunity to ask questions.  Patient verbalized understanding of the plan and was able to repeat key elements of the plan. Patient was given clear instructions to go to Emergency Department or return to medical center if symptoms don't improve, worsen, or new problems develop.The patient verbalized understanding.   No orders of the defined types were placed in this encounter.    Requested Prescriptions    No prescriptions requested or ordered in this encounter    No follow-ups on file.  Rema Fendt, NP

## 2022-06-17 DIAGNOSIS — Z992 Dependence on renal dialysis: Secondary | ICD-10-CM | POA: Diagnosis not present

## 2022-06-17 DIAGNOSIS — N2581 Secondary hyperparathyroidism of renal origin: Secondary | ICD-10-CM | POA: Diagnosis not present

## 2022-06-17 DIAGNOSIS — N186 End stage renal disease: Secondary | ICD-10-CM | POA: Diagnosis not present

## 2022-06-18 ENCOUNTER — Encounter: Payer: BC Managed Care – PPO | Admitting: Family

## 2022-06-18 DIAGNOSIS — Z1231 Encounter for screening mammogram for malignant neoplasm of breast: Secondary | ICD-10-CM

## 2022-06-18 DIAGNOSIS — Z0000 Encounter for general adult medical examination without abnormal findings: Secondary | ICD-10-CM

## 2022-06-18 DIAGNOSIS — Z1329 Encounter for screening for other suspected endocrine disorder: Secondary | ICD-10-CM

## 2022-06-18 DIAGNOSIS — Z13228 Encounter for screening for other metabolic disorders: Secondary | ICD-10-CM

## 2022-06-18 DIAGNOSIS — Z131 Encounter for screening for diabetes mellitus: Secondary | ICD-10-CM

## 2022-06-18 DIAGNOSIS — Z13220 Encounter for screening for lipoid disorders: Secondary | ICD-10-CM

## 2022-06-18 DIAGNOSIS — Z8639 Personal history of other endocrine, nutritional and metabolic disease: Secondary | ICD-10-CM

## 2022-06-19 DIAGNOSIS — N186 End stage renal disease: Secondary | ICD-10-CM | POA: Diagnosis not present

## 2022-06-19 DIAGNOSIS — N2581 Secondary hyperparathyroidism of renal origin: Secondary | ICD-10-CM | POA: Diagnosis not present

## 2022-06-19 DIAGNOSIS — Z992 Dependence on renal dialysis: Secondary | ICD-10-CM | POA: Diagnosis not present

## 2022-06-22 DIAGNOSIS — N186 End stage renal disease: Secondary | ICD-10-CM | POA: Diagnosis not present

## 2022-06-22 DIAGNOSIS — N2581 Secondary hyperparathyroidism of renal origin: Secondary | ICD-10-CM | POA: Diagnosis not present

## 2022-06-22 DIAGNOSIS — Z992 Dependence on renal dialysis: Secondary | ICD-10-CM | POA: Diagnosis not present

## 2022-06-24 DIAGNOSIS — N186 End stage renal disease: Secondary | ICD-10-CM | POA: Diagnosis not present

## 2022-06-24 DIAGNOSIS — N2581 Secondary hyperparathyroidism of renal origin: Secondary | ICD-10-CM | POA: Diagnosis not present

## 2022-06-24 DIAGNOSIS — Z992 Dependence on renal dialysis: Secondary | ICD-10-CM | POA: Diagnosis not present

## 2022-06-26 DIAGNOSIS — Z992 Dependence on renal dialysis: Secondary | ICD-10-CM | POA: Diagnosis not present

## 2022-06-26 DIAGNOSIS — N2581 Secondary hyperparathyroidism of renal origin: Secondary | ICD-10-CM | POA: Diagnosis not present

## 2022-06-26 DIAGNOSIS — N186 End stage renal disease: Secondary | ICD-10-CM | POA: Diagnosis not present

## 2022-06-29 DIAGNOSIS — Z992 Dependence on renal dialysis: Secondary | ICD-10-CM | POA: Diagnosis not present

## 2022-06-29 DIAGNOSIS — N186 End stage renal disease: Secondary | ICD-10-CM | POA: Diagnosis not present

## 2022-06-29 DIAGNOSIS — N2581 Secondary hyperparathyroidism of renal origin: Secondary | ICD-10-CM | POA: Diagnosis not present

## 2022-07-01 DIAGNOSIS — N186 End stage renal disease: Secondary | ICD-10-CM | POA: Diagnosis not present

## 2022-07-01 DIAGNOSIS — N2581 Secondary hyperparathyroidism of renal origin: Secondary | ICD-10-CM | POA: Diagnosis not present

## 2022-07-01 DIAGNOSIS — Z992 Dependence on renal dialysis: Secondary | ICD-10-CM | POA: Diagnosis not present

## 2022-07-06 DIAGNOSIS — N186 End stage renal disease: Secondary | ICD-10-CM | POA: Diagnosis not present

## 2022-07-06 DIAGNOSIS — N2581 Secondary hyperparathyroidism of renal origin: Secondary | ICD-10-CM | POA: Diagnosis not present

## 2022-07-06 DIAGNOSIS — Z992 Dependence on renal dialysis: Secondary | ICD-10-CM | POA: Diagnosis not present

## 2022-07-08 DIAGNOSIS — N2581 Secondary hyperparathyroidism of renal origin: Secondary | ICD-10-CM | POA: Diagnosis not present

## 2022-07-08 DIAGNOSIS — N186 End stage renal disease: Secondary | ICD-10-CM | POA: Diagnosis not present

## 2022-07-08 DIAGNOSIS — Z992 Dependence on renal dialysis: Secondary | ICD-10-CM | POA: Diagnosis not present

## 2022-07-10 DIAGNOSIS — N186 End stage renal disease: Secondary | ICD-10-CM | POA: Diagnosis not present

## 2022-07-10 DIAGNOSIS — Z992 Dependence on renal dialysis: Secondary | ICD-10-CM | POA: Diagnosis not present

## 2022-07-10 DIAGNOSIS — N2581 Secondary hyperparathyroidism of renal origin: Secondary | ICD-10-CM | POA: Diagnosis not present

## 2022-07-11 DIAGNOSIS — Z992 Dependence on renal dialysis: Secondary | ICD-10-CM | POA: Diagnosis not present

## 2022-07-11 DIAGNOSIS — E1122 Type 2 diabetes mellitus with diabetic chronic kidney disease: Secondary | ICD-10-CM | POA: Diagnosis not present

## 2022-07-11 DIAGNOSIS — N186 End stage renal disease: Secondary | ICD-10-CM | POA: Diagnosis not present

## 2022-07-12 ENCOUNTER — Ambulatory Visit: Payer: Self-pay | Admitting: Nurse Practitioner

## 2022-07-13 DIAGNOSIS — N186 End stage renal disease: Secondary | ICD-10-CM | POA: Diagnosis not present

## 2022-07-13 DIAGNOSIS — Z992 Dependence on renal dialysis: Secondary | ICD-10-CM | POA: Diagnosis not present

## 2022-07-13 DIAGNOSIS — N2581 Secondary hyperparathyroidism of renal origin: Secondary | ICD-10-CM | POA: Diagnosis not present

## 2022-07-15 DIAGNOSIS — N186 End stage renal disease: Secondary | ICD-10-CM | POA: Diagnosis not present

## 2022-07-15 DIAGNOSIS — N2581 Secondary hyperparathyroidism of renal origin: Secondary | ICD-10-CM | POA: Diagnosis not present

## 2022-07-15 DIAGNOSIS — Z992 Dependence on renal dialysis: Secondary | ICD-10-CM | POA: Diagnosis not present

## 2022-07-17 DIAGNOSIS — N186 End stage renal disease: Secondary | ICD-10-CM | POA: Diagnosis not present

## 2022-07-17 DIAGNOSIS — Z992 Dependence on renal dialysis: Secondary | ICD-10-CM | POA: Diagnosis not present

## 2022-07-17 DIAGNOSIS — N2581 Secondary hyperparathyroidism of renal origin: Secondary | ICD-10-CM | POA: Diagnosis not present

## 2022-07-19 ENCOUNTER — Ambulatory Visit: Payer: BC Managed Care – PPO | Admitting: Family

## 2022-07-19 DIAGNOSIS — L820 Inflamed seborrheic keratosis: Secondary | ICD-10-CM | POA: Diagnosis not present

## 2022-07-20 DIAGNOSIS — N186 End stage renal disease: Secondary | ICD-10-CM | POA: Diagnosis not present

## 2022-07-20 DIAGNOSIS — N2581 Secondary hyperparathyroidism of renal origin: Secondary | ICD-10-CM | POA: Diagnosis not present

## 2022-07-20 DIAGNOSIS — Z992 Dependence on renal dialysis: Secondary | ICD-10-CM | POA: Diagnosis not present

## 2022-07-20 DIAGNOSIS — Z23 Encounter for immunization: Secondary | ICD-10-CM | POA: Diagnosis not present

## 2022-07-22 DIAGNOSIS — Z992 Dependence on renal dialysis: Secondary | ICD-10-CM | POA: Diagnosis not present

## 2022-07-22 DIAGNOSIS — N2581 Secondary hyperparathyroidism of renal origin: Secondary | ICD-10-CM | POA: Diagnosis not present

## 2022-07-22 DIAGNOSIS — Z23 Encounter for immunization: Secondary | ICD-10-CM | POA: Diagnosis not present

## 2022-07-22 DIAGNOSIS — N186 End stage renal disease: Secondary | ICD-10-CM | POA: Diagnosis not present

## 2022-07-24 DIAGNOSIS — N2581 Secondary hyperparathyroidism of renal origin: Secondary | ICD-10-CM | POA: Diagnosis not present

## 2022-07-24 DIAGNOSIS — N186 End stage renal disease: Secondary | ICD-10-CM | POA: Diagnosis not present

## 2022-07-24 DIAGNOSIS — Z23 Encounter for immunization: Secondary | ICD-10-CM | POA: Diagnosis not present

## 2022-07-24 DIAGNOSIS — Z992 Dependence on renal dialysis: Secondary | ICD-10-CM | POA: Diagnosis not present

## 2022-07-27 DIAGNOSIS — Z992 Dependence on renal dialysis: Secondary | ICD-10-CM | POA: Diagnosis not present

## 2022-07-27 DIAGNOSIS — N2581 Secondary hyperparathyroidism of renal origin: Secondary | ICD-10-CM | POA: Diagnosis not present

## 2022-07-27 DIAGNOSIS — N186 End stage renal disease: Secondary | ICD-10-CM | POA: Diagnosis not present

## 2022-07-29 DIAGNOSIS — Z992 Dependence on renal dialysis: Secondary | ICD-10-CM | POA: Diagnosis not present

## 2022-07-29 DIAGNOSIS — N186 End stage renal disease: Secondary | ICD-10-CM | POA: Diagnosis not present

## 2022-07-29 DIAGNOSIS — N2581 Secondary hyperparathyroidism of renal origin: Secondary | ICD-10-CM | POA: Diagnosis not present

## 2022-07-31 DIAGNOSIS — N186 End stage renal disease: Secondary | ICD-10-CM | POA: Diagnosis not present

## 2022-07-31 DIAGNOSIS — N2581 Secondary hyperparathyroidism of renal origin: Secondary | ICD-10-CM | POA: Diagnosis not present

## 2022-07-31 DIAGNOSIS — Z992 Dependence on renal dialysis: Secondary | ICD-10-CM | POA: Diagnosis not present

## 2022-08-03 DIAGNOSIS — Z992 Dependence on renal dialysis: Secondary | ICD-10-CM | POA: Diagnosis not present

## 2022-08-03 DIAGNOSIS — N2581 Secondary hyperparathyroidism of renal origin: Secondary | ICD-10-CM | POA: Diagnosis not present

## 2022-08-03 DIAGNOSIS — N186 End stage renal disease: Secondary | ICD-10-CM | POA: Diagnosis not present

## 2022-08-07 DIAGNOSIS — N186 End stage renal disease: Secondary | ICD-10-CM | POA: Diagnosis not present

## 2022-08-07 DIAGNOSIS — Z992 Dependence on renal dialysis: Secondary | ICD-10-CM | POA: Diagnosis not present

## 2022-08-07 DIAGNOSIS — N2581 Secondary hyperparathyroidism of renal origin: Secondary | ICD-10-CM | POA: Diagnosis not present

## 2022-08-10 DIAGNOSIS — N186 End stage renal disease: Secondary | ICD-10-CM | POA: Diagnosis not present

## 2022-08-10 DIAGNOSIS — N2581 Secondary hyperparathyroidism of renal origin: Secondary | ICD-10-CM | POA: Diagnosis not present

## 2022-08-10 DIAGNOSIS — Z992 Dependence on renal dialysis: Secondary | ICD-10-CM | POA: Diagnosis not present

## 2022-08-11 DIAGNOSIS — N186 End stage renal disease: Secondary | ICD-10-CM | POA: Diagnosis not present

## 2022-08-11 DIAGNOSIS — E1122 Type 2 diabetes mellitus with diabetic chronic kidney disease: Secondary | ICD-10-CM | POA: Diagnosis not present

## 2022-08-11 DIAGNOSIS — Z992 Dependence on renal dialysis: Secondary | ICD-10-CM | POA: Diagnosis not present

## 2022-08-12 DIAGNOSIS — N2581 Secondary hyperparathyroidism of renal origin: Secondary | ICD-10-CM | POA: Diagnosis not present

## 2022-08-12 DIAGNOSIS — N186 End stage renal disease: Secondary | ICD-10-CM | POA: Diagnosis not present

## 2022-08-12 DIAGNOSIS — Z992 Dependence on renal dialysis: Secondary | ICD-10-CM | POA: Diagnosis not present

## 2022-08-14 ENCOUNTER — Other Ambulatory Visit: Payer: Self-pay | Admitting: Cardiovascular Disease

## 2022-08-14 DIAGNOSIS — N2581 Secondary hyperparathyroidism of renal origin: Secondary | ICD-10-CM | POA: Diagnosis not present

## 2022-08-14 DIAGNOSIS — Z992 Dependence on renal dialysis: Secondary | ICD-10-CM | POA: Diagnosis not present

## 2022-08-14 DIAGNOSIS — N186 End stage renal disease: Secondary | ICD-10-CM | POA: Diagnosis not present

## 2022-08-16 ENCOUNTER — Telehealth: Payer: Self-pay | Admitting: Cardiovascular Disease

## 2022-08-16 ENCOUNTER — Encounter: Payer: BC Managed Care – PPO | Admitting: Family

## 2022-08-16 DIAGNOSIS — L820 Inflamed seborrheic keratosis: Secondary | ICD-10-CM | POA: Diagnosis not present

## 2022-08-16 MED ORDER — CARVEDILOL 12.5 MG PO TABS: 12.5000 mg | ORAL_TABLET | Freq: Two times a day (BID) | ORAL | 0 refills | Status: DC

## 2022-08-16 NOTE — Telephone Encounter (Signed)
Call to patient.  LM that 30 day supply to [facility].  Another sent to Mail order with no refills until her appointment with provider, scheduled for August

## 2022-08-16 NOTE — Telephone Encounter (Signed)
Pt c/o medication issue:  1. Name of Medication:   carvedilol (COREG) 12.5 MG tablet    2. How are you currently taking this medication (dosage and times per day)? Take 1 tablet (12.5 mg total) by mouth 2 (two) times daily with a meal.   3. Are you having a reaction (difficulty breathing--STAT)? No  4. What is your medication issue? Patient is completely out of medication. Patient would like to know if she can get a month supply sent to [facility]. Patient would like to know if she can then [facility] Please advise.

## 2022-08-17 DIAGNOSIS — N186 End stage renal disease: Secondary | ICD-10-CM | POA: Diagnosis not present

## 2022-08-17 DIAGNOSIS — N2581 Secondary hyperparathyroidism of renal origin: Secondary | ICD-10-CM | POA: Diagnosis not present

## 2022-08-17 DIAGNOSIS — Z992 Dependence on renal dialysis: Secondary | ICD-10-CM | POA: Diagnosis not present

## 2022-08-19 DIAGNOSIS — N186 End stage renal disease: Secondary | ICD-10-CM | POA: Diagnosis not present

## 2022-08-19 DIAGNOSIS — N2581 Secondary hyperparathyroidism of renal origin: Secondary | ICD-10-CM | POA: Diagnosis not present

## 2022-08-19 DIAGNOSIS — Z992 Dependence on renal dialysis: Secondary | ICD-10-CM | POA: Diagnosis not present

## 2022-08-20 NOTE — Progress Notes (Addendum)
Cardiology Clinic Note   Patient Name: [name] Date of Encounter: 08/23/2022  Primary Care Provider:  Donell Beers, FNP Primary Cardiologist:  Nanetta Batty, MD  Patient Profile    [name] 62 year old female presents the clinic today for follow-up evaluation of her coronary artery disease and hypertension.  Past Medical History    Past Medical History:  Diagnosis Date   Angina    CAD (coronary artery disease), non obstructive CAD in 2010    a. 2013 Cath: LAD 60, RCA 90m, 70d; b. 2014 Cath: stable anatomy.   Chronic kidney disease    Stage 5; polycystic kidney disease , elevated creatine   COPD (chronic obstructive pulmonary disease) (HCC)    Diabetes mellitus without complication (HCC)    a. 12/2014 HbA1c 6.5; b. Rx Glipizide - not taking.   Dysrhythmia    Family history of premature CAD    GERD (gastroesophageal reflux disease)    Hx of adenomatous colonic polyps 06/11/2016   Hyperlipidemia    Hypertension    Hypertensive heart disease    Palpitations    Reflux    Sleep apnea    mild, no c-pap at this point   Tobacco abuse    Past Surgical History:  Procedure Laterality Date   APPENDECTOMY  1974   AV FISTULA PLACEMENT Left 02/19/2020   Procedure: LEFT ARM BRACHIOCEPHALIC  ARTERIOVENOUS (AV) FISTULA CREATION;  Surgeon: Maeola Harman, MD;  Location: [facility] OR;  Service: Vascular;  Laterality: Left;   CARDIAC CATHETERIZATION  02/07/2008   Recommendation-F/U stress test   CARDIAC CATHETERIZATION  04/16/2011   Recommendation-Increase medical therapy trial   CARDIOVASCULAR STRESS TEST  03/25/2011   Suspect subtle anterior septal ischemia   COLONOSCOPY     FISTULA SUPERFICIALIZATION Left 05/13/2020   Procedure: FISTULA SUPERFICIALIZATION LEFT;  Surgeon: Maeola Harman, MD;  Location[facility] OR;  Service: Vascular;  Laterality: Left;   FRACTURE SURGERY Left 1972   forearm fracture   LEFT HEART CATHETERIZATION WITH CORONARY  ANGIOGRAM N/A 04/16/2011   Procedure: LEFT HEART CATHETERIZATION WITH CORONARY ANGIOGRAM;  Surgeon: Lennette Bihari, MD;  Location: [facility] CATH LAB;  Service: Cardiovascular;  Laterality: N/A;   LEFT HEART CATHETERIZATION WITH CORONARY ANGIOGRAM N/A 09/01/2012   Procedure: LEFT HEART CATHETERIZATION WITH CORONARY ANGIOGRAM;  Surgeon: Chrystie Nose, MD;  Location: [facility] CATH LAB;  Service: Cardiovascular;  Laterality: N/A;   POLYPECTOMY     RENAL DOPPLER  06/05/2009   No evidence of significant diameter reduction   TRANSTHORACIC ECHOCARDIOGRAM  01/25/2008   EF 60-65%, Moderate concentric LVH   WRIST FRACTURE SURGERY Right ~ 1996    Allergies  Allergies  Allergen Reactions   Bupropion Other (See Comments) and Nausea And Vomiting    Suicidal thoughts and increased depression    Suicidal thoughts and increased depression unknown Other reaction(s): Other Suicidal thoughts and increased depression Suicidal thoughts and increased depression  Suicidal thoughts and increased depression, unknown, Other reaction(s): Other, Suicidal thoughts and increased depression, Suicidal thoughts and increased depression   Doxazosin Mesylate     Increased heart rate   Ezetimibe Nausea And Vomiting    myalgias  myalgias, myalgias   Rosuvastatin Other (See Comments) and Nausea And Vomiting    myalgia  myalgia, myalgia   Simvastatin Nausea And Vomiting    myalgias  myalgias, myalgias   Wellbutrin [Bupropion Hcl] Other (See Comments)    Suicidal thoughts and increased depression   Doxazosin Nausea And Vomiting and Palpitations  Increased heart rate    History of Present Illness    [name] has a PMH of CAD, HTN, HLD, acute on chronic diastolic CHF, type 2 diabetes, chronic renal insufficiency stage IV, tobacco abuse, abnormal Lexiscan 3/13 showing anterior ischemia, edema, and truncal obesity.  She underwent cardiac catheterization in 2010, 4/13 and again in 2014.  On her repeat cath in 2013 she was  noted to have 60% LAD stenosis 50% mid RCA, 70% distal RCA, and normal LV function.  She was seen as an outpatient.  She underwent cardiac catheterization 8/14 which showed unchanged anatomy from her previous cardiac catheterization.  At that time she was noted to have 60% RCA lesion and other scattered noncritical disease with normal LV function.  Medical management was recommended.  She was seen in follow-up by Dr. Allyson Sabal on 05/07/2021.  She continued to do well at that time.  Her creatinine was in the 7 range.  She had an AV fistula placed in anticipation for HD by Dr. Randie Heinz.  She denied chest pain and shortness of breath.  Her blood pressure was well controlled at home in the 130/80 range.  She presented to the emergency department on 10/25/2021 and was discharged on 10/30/2021.  She reported worsening headaches and was found to have uncontrolled hypertension in the setting of progression of end-stage renal disease.  She was admitted to hospital service.  She was continued on clonidine, amiodarone, carvedilol, and hydralazine.  She continued hemodialysis.  Her blood pressure significantly improved.  She presented to the clinic 11/06/21 for follow-up evaluation stated that she felt well.  She reported compliance with hemodialysis Monday Wednesday Friday.  She wished to switch to Tuesday Thursday Saturday.  She felt it would work better with her work schedule.  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  Her blood pressures had been well controlled at home.  On hemodialysis days she noticed blood pressures in the 110/60 range.  Initially her blood pressure was 160/72 and on recheck was 122/66.  Nephrology had been titrating her blood pressure medications.  We reviewed her recent hospitalization and she expressed understanding.  We reviewed the importance of heart healthy low-sodium diet and increasing her physical activity.   6 month follow up was planned.  She presents to the clinic today for  follow-up evaluation and states she has transition to dialysis on Tuesday Thursday Saturday.  She is planning to have echocardiogram done on 826 as well as other diagnostic testing for renal transplant.  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  She has not had recent fasting lipids or LFTs done.  I will repeat fasting lipids and LFTs today and we will plan follow-up in 6 to 9 months.  I have also asked her to increase her physical activity as tolerated.  Today she denies chest pain, shortness of breath, lower extremity edema, fatigue, palpitations, melena, hematuria, hemoptysis, diaphoresis, weakness, presyncope, syncope, orthopnea, and PND.     Home Medications    Prior to Admission medications   Medication Sig Start Date End Date Taking? Authorizing Provider  amLODipine (NORVASC) 10 MG tablet Take 1 tablet (10 mg total) by mouth daily. 10/30/21 11/29/21  Jerald Kief, MD  aspirin 81 MG tablet Take 81 mg by mouth at bedtime.    [provider]  carvedilol (COREG) 12.5 MG tablet Take 1 tablet (12.5 mg total) by mouth 2 (two) times daily with a meal. 10/30/21 11/29/21  Jerald Kief, MD  Cholecalciferol (VITAMIN D3) 50 MCG (2000 UT) TABS Take 2,000 Units by mouth daily.    [provider]  cloNIDine (CATAPRES) 0.2 MG tablet Take 1 tablet (0.2 mg total) by mouth 2 (two) times daily. Appointment needed 03/03/21   Runell Gess, MD  [employer] IRON 325 (65 Fe) MG tablet Take 325 mg by mouth 2 (two) times daily with a meal. 04/04/20   [provider]  hydrALAZINE (APRESOLINE) 100 MG tablet Take 1 tablet (100 mg total) by mouth 3 (three) times daily. 10/30/21 11/29/21  Jerald Kief, MD  lidocaine-prilocaine (EMLA) cream Apply 1 Application topically as needed (for HD). 10/30/21   Jerald Kief, MD  Ethelda Chick Calcium 500 MG TABS Take 500 mg by mouth 2 (two) times daily. 04/18/19   [provider]  sevelamer carbonate  (RENVELA) 800 MG tablet Take 1,600 mg by mouth 3 (three) times daily with meals. 04/24/21   [provider]  sodium bicarbonate 650 MG tablet Take 1,950 mg by mouth 2 (two) times daily. 04/04/20   [provider]    Family History    Family History  Problem Relation Age of Onset   Stroke Father    Hypertension Father    Coronary artery disease Brother    Diabetes Mother    Stroke Maternal Grandmother    Cancer Maternal Grandmother        Breast cancer   Hypertension Maternal Grandfather    Heart attack Maternal Grandfather    Heart attack Maternal Aunt 4   Colon cancer Neg Hx    Esophageal cancer Neg Hx    Stomach cancer Neg Hx    Rectal cancer Neg Hx    She indicated that her mother is alive. She indicated that her father is deceased. She indicated that her brother is alive. She indicated that her maternal grandmother is deceased. She indicated that her maternal grandfather is deceased. She indicated that her paternal grandmother is deceased. She indicated that her paternal grandfather is deceased. She indicated that the status of her maternal aunt is unknown. She indicated that the status of her neg hx is unknown.  Social History    Social History   Socioeconomic History   Marital status: Divorced    Spouse name: Not on file   Number of children: 1   Years of education: Not on file   Highest education level: Not on file  Occupational History   Occupation: Geophysicist/field seismologist: [employer]  Tobacco Use   Smoking status: Former    Current packs/day: 0.00    Average packs/day: 0.3 packs/day for 30.0 years (7.5 ttl pk-yrs)    Types: Cigarettes    Start date: 10/24/1991    Quit date: 10/23/2021    Years since quitting: 0.8   Smokeless tobacco: Never   Tobacco comments:    2-  weeks  Vaping Use   Vaping status: Never Used  Substance and Sexual Activity   Alcohol use: No    Alcohol/week: 0.0 standard drinks of alcohol   Drug use: No   Sexual  activity: Not Currently    Partners: Male  Other Topics Concern   Not on file  Social History Narrative   Lives with her son and her aunt lives with them as well.    Social Determinants of Health   Financial Resource Strain: Medium Risk (10/24/2017)   Overall Financial Resource Strain (CARDIA)    Difficulty of Paying  Living Expenses: Somewhat hard  Food Insecurity: No Food Insecurity (10/25/2021)   Hunger Vital Sign    Worried About Running Out of Food in the Last Year: Never true    Ran Out of Food in the Last Year: Never true  Transportation Needs: No Transportation Needs (10/25/2021)   PRAPARE - Administrator, Civil Service (Medical): No    Lack of Transportation (Non-Medical): No  Physical Activity: Inactive (10/24/2017)   Exercise Vital Sign    Days of Exercise per Week: 0 days    Minutes of Exercise per Session: 0 min  Stress: Stress Concern Present (10/24/2017)   [employer] of Occupational Health - Occupational Stress Questionnaire    Feeling of Stress : To some extent  Social Connections: Somewhat Isolated (10/24/2017)   Social Connection and Isolation Panel [NHANES]    Frequency of Communication with Friends and Family: More than three times a week    Frequency of Social Gatherings with Friends and Family: Once a week    Attends Religious Services: 1 to 4 times per year    Active Member of [employer] or Organizations: No    Attends Banker Meetings: Never    Marital Status: Divorced  Catering manager Violence: Not At Risk (10/25/2021)   Humiliation, Afraid, Rape, and Kick questionnaire    Fear of Current or Ex-Partner: No    Emotionally Abused: No    Physically Abused: No    Sexually Abused: No     Review of Systems    General:  No chills, fever, night sweats or weight changes.  Cardiovascular:  No chest pain, dyspnea on exertion, edema, orthopnea, palpitations, paroxysmal nocturnal dyspnea. Dermatological: No rash,  lesions/masses Respiratory: No cough, dyspnea Urologic: No hematuria, dysuria Abdominal:   No nausea, vomiting, diarrhea, bright red blood per rectum, melena, or hematemesis Neurologic:  No visual changes, wkns, changes in mental status. All other systems reviewed and are otherwise negative except as noted above.  Physical Exam    VS:  BP (!) 142/78   Pulse 81   Ht 5\' 4"  (1.626 m)   Wt 199 lb 12.8 oz (90.6 kg)   LMP 12/14/2013   SpO2 95%   BMI 34.30 kg/m  , BMI Body mass index is 34.3 kg/m. GEN: Well nourished, well developed, in no acute distress. HEENT: normal. Neck: Supple, no JVD, carotid bruits, or masses. Cardiac: RRR, 3/6 systolic murmur heard along right sternal border, rubs, or gallops. No clubbing, cyanosis, edema.  Radials/DP/PT 2+ and equal bilaterally.  Respiratory:  Respirations regular and unlabored, clear to auscultation bilaterally. GI: Soft, nontender, nondistended, BS + x 4. MS: no deformity or atrophy. Skin: warm and dry, no rash. Neuro:  Strength and sensation are intact. Psych: Normal affect.  Accessory Clinical Findings    Recent Labs: 10/24/2021: TSH 1.844 10/28/2021: Magnesium 1.8 10/29/2021: ALT 18; BUN 51; Creatinine, Ser 8.71; Hemoglobin 8.8; Platelets 212; Potassium 4.1; Sodium 136   Recent Lipid Panel    Component Value Date/Time   CHOL 183 10/05/2018 0923   TRIG 251 (H) 10/05/2018 0923   HDL 29 (L) 10/05/2018 0923   CHOLHDL 6.3 (H) 10/05/2018 0923   CHOLHDL 7.0 (H) 04/01/2016 0847   VLDL 52 (H) 04/01/2016 0847   LDLCALC 110 (H) 10/05/2018 0923    HYPERTENSION CONTROL Vitals:   08/23/22 1020 08/23/22 1110 08/23/22 1124  BP: (!) 146/94 (!) 142/78 (!) 142/78    The patient's blood pressure is elevated above target today.  In order  to address the patient's elevated BP: Blood pressure will be monitored at home to determine if medication changes need to be made.       ECG personally reviewed by me today-EKG  Interpretation Date/Time:  Monday August 23 2022 10:22:43 EDT Ventricular Rate:  81 PR Interval:  180 QRS Duration:  90 QT Interval:  384 QTC Calculation: 446 R Axis:   54  Text Interpretation: Normal sinus rhythm ST & T wave abnormality, consider lateral ischemia Confirmed by Edd Fabian [id number]) on 08/23/2022 11:01:00 AM   Echocardiogram 11/02/2019  IMPRESSIONS     1. Left ventricular ejection fraction, by estimation, is 60 to 65%. The  left ventricle has normal function. The left ventricle has no regional  wall motion abnormalities. There is moderate left ventricular hypertrophy.  Left ventricular diastolic  parameters are consistent with Grade I diastolic dysfunction (impaired  relaxation).   2. Right ventricular systolic function is normal. The right ventricular  size is normal.   3. The mitral valve is normal in structure. Trivial mitral valve  regurgitation. No evidence of mitral stenosis.   4. The aortic valve is normal in structure. Aortic valve regurgitation is  not visualized. No aortic stenosis is present.   5. The inferior vena cava is normal in size with greater than 50%  respiratory variability, suggesting right atrial pressure of 3 mmHg.  Assessment & Plan   1.  Coronary artery disease-denies recent chest pain.  Under went cardiac catheterization 2014 showed unchanged coronary anatomy with 60% RCA lesion. Continue amlodipine, aspirin, carvedilol Heart healthy low-sodium diet-salty 6 viewed Increase physical activity as tolerated  Hyperlipidemia-LDL110 on 9/20. No recent labs Continue aspirin Heart healthy low-sodium high-fiber diet Increase physical activity as tolerated Repeat fasting lipids and lfts  Essential hypertension-BP today 142/78.    Continue amlodipine, aspirin, carvedilol,  hydralazine Heart healthy low-sodium diet Follows with nephrology  End-stage renal disease-compliant with HD.  Doing workup with [facility] for renal  transplant. Follows with nephrology  Disposition: Follow-up with Dr.Berry or me in 6-9 months.   Thomasene Ripple. Suhani Stillion NP-C     08/23/2022, 11:24 AM [facility] [address] Office [phone] Fax [phone]  Notice: This dictation was prepared with Dragon dictation along with smaller phrase technology. Any transcriptional errors that result from this process are unintentional and may not be corrected upon review.  I spent 14 minutes examining this patient, reviewing medications, and using patient centered shared decision making involving her cardiac care.  Prior to her visit I spent greater than 20 minutes reviewing her past medical history,  medications, and prior cardiac tests.

## 2022-08-21 DIAGNOSIS — N186 End stage renal disease: Secondary | ICD-10-CM | POA: Diagnosis not present

## 2022-08-21 DIAGNOSIS — N2581 Secondary hyperparathyroidism of renal origin: Secondary | ICD-10-CM | POA: Diagnosis not present

## 2022-08-21 DIAGNOSIS — Z992 Dependence on renal dialysis: Secondary | ICD-10-CM | POA: Diagnosis not present

## 2022-08-23 ENCOUNTER — Encounter: Payer: Self-pay | Admitting: General Practice

## 2022-08-23 ENCOUNTER — Ambulatory Visit: Payer: BC Managed Care – PPO | Attending: General Practice | Admitting: General Practice

## 2022-08-23 VITALS — BP 142/78 | HR 81 | Ht 64.0 in | Wt 199.8 lb

## 2022-08-23 DIAGNOSIS — I2510 Atherosclerotic heart disease of native coronary artery without angina pectoris: Secondary | ICD-10-CM

## 2022-08-23 DIAGNOSIS — N186 End stage renal disease: Secondary | ICD-10-CM

## 2022-08-23 DIAGNOSIS — I10 Essential (primary) hypertension: Secondary | ICD-10-CM | POA: Diagnosis not present

## 2022-08-23 DIAGNOSIS — E785 Hyperlipidemia, unspecified: Secondary | ICD-10-CM

## 2022-08-23 NOTE — Patient Instructions (Signed)
Medication Instructions:  The current medical regimen is effective;  continue present plan and medications as directed. Please refer to the Current Medication list given to you today.  *If you need a refill on your cardiac medications before your next appointment, please call your pharmacy*  Lab Work: FASTING LIPID AND LFT TODAY If you have labs (blood work) drawn today and your tests are completely normal, you will receive your results only by:  MyChart Message (if you have MyChart) OR  A paper copy in the mail If you have any lab test that is abnormal or we need to change your treatment, we will call you to review the results.  Other Instructions Increase physical activity as tolerated PLEASE READ AND FOLLOW LOW SODIUM DIET-ATTACHED  Follow-Up: At [facility], you and your health needs are our priority.  As part of our continuing mission to provide you with exceptional heart care, we have created designated Provider Care Teams.  These Care Teams include your primary Cardiologist (physician) and Advanced Practice Providers (APPs -  Physician Assistants and Nurse Practitioners) who all work together to provide you with the care you need, when you need it.  Your next appointment:   6-9 month(s)  Provider:   Nanetta Batty, MD  or Edd Fabian, FNP          LOW SODIUM DIET DASH stands for Dietary Approaches to Stop Hypertension. The DASH eating plan is a healthy eating plan that has been shown to: Lower high blood pressure (hypertension). Reduce your risk for type 2 diabetes, heart disease, and stroke. Help with weight loss. What are tips for following this plan? Reading food labels Check food labels for the amount of salt (sodium) per serving. Choose foods with less than 5 percent of the Daily Value (DV) of sodium. In general, foods with less than 300 milligrams (mg) of sodium per serving fit into this eating plan. To find whole grains, look for the word "whole" as the first  word in the ingredient list. Shopping Buy products labeled as "low-sodium" or "no salt added." Buy fresh foods. Avoid canned foods and pre-made or frozen meals. Cooking Try not to add salt when you cook. Use salt-free seasonings or herbs instead of table salt or sea salt. Check with your health care provider or pharmacist before using salt substitutes. Do not fry foods. Cook foods in healthy ways, such as baking, boiling, grilling, roasting, or broiling. Cook using oils that are good for your heart. These include olive, canola, avocado, soybean, and sunflower oil. Meal planning  Eat a balanced diet. This should include: 4 or more servings of fruits and 4 or more servings of vegetables each day. Try to fill half of your plate with fruits and vegetables. 6-8 servings of whole grains each day. 6 or less servings of lean meat, poultry, or fish each day. 1 oz is 1 serving. A 3 oz (85 g) serving of meat is about the same size as the palm of your hand. One egg is 1 oz (28 g). 2-3 servings of low-fat dairy each day. One serving is 1 cup (237 mL). 1 serving of nuts, seeds, or beans 5 times each week. 2-3 servings of heart-healthy fats. Healthy fats called omega-3 fatty acids are found in foods such as walnuts, flaxseeds, fortified milks, and eggs. These fats are also found in cold-water fish, such as sardines, salmon, and mackerel. Limit how much you eat of: Canned or prepackaged foods. Food that is high in trans fat,  such as fried foods. Food that is high in saturated fat, such as fatty meat. Desserts and other sweets, sugary drinks, and other foods with added sugar. Full-fat dairy products. Do not salt foods before eating. Do not eat more than 4 egg yolks a week. Try to eat at least 2 vegetarian meals a week. Eat more home-cooked food and less restaurant, buffet, and fast food. Lifestyle When eating at a restaurant, ask if your food can be made with less salt or no salt. If you drink  alcohol: Limit how much you have to: 0-1 drink a day if you are female. 0-2 drinks a day if you are female. Know how much alcohol is in your drink. In the [place] one drink is one 12 oz bottle of beer (355 mL), one 5 oz glass of wine (148 mL), or one 1 oz glass of hard liquor (44 mL). General information Avoid eating more than 2,300 mg of salt a day. If you have hypertension, you may need to reduce your sodium intake to 1,500 mg a day. Work with your provider to stay at a healthy body weight or lose weight. Ask what the best weight range is for you. On most days of the week, get at least 30 minutes of exercise that causes your heart to beat faster. This may include walking, swimming, or biking. Work with your provider or dietitian to adjust your eating plan to meet your specific calorie needs. What foods should I eat? Fruits All fresh, dried, or frozen fruit. Canned fruits that are in their natural juice and do not have sugar added to them. Vegetables Fresh or frozen vegetables that are raw, steamed, roasted, or grilled. Low-sodium or reduced-sodium tomato and vegetable juice. Low-sodium or reduced-sodium tomato sauce and tomato paste. Low-sodium or reduced-sodium canned vegetables. Grains Whole-grain or whole-wheat bread. Whole-grain or whole-wheat pasta. Brown rice. Orpah Cobb. Bulgur. Whole-grain and low-sodium cereals. Pita bread. Low-fat, low-sodium crackers. Whole-wheat flour tortillas. Meats and other proteins Skinless chicken or [place]. Ground chicken or [place]. Pork with fat trimmed off. Fish and seafood. Egg whites. Dried beans, peas, or lentils. Unsalted nuts, nut butters, and seeds. Unsalted canned beans. Lean cuts of beef with fat trimmed off. Low-sodium, lean precooked or cured meat, such as sausages or meat loaves. Dairy Low-fat (1%) or fat-free (skim) milk. Reduced-fat, low-fat, or fat-free cheeses. Nonfat, low-sodium ricotta or cottage cheese. Low-fat or nonfat yogurt. Low-fat,  low-sodium cheese. Fats and oils Soft margarine without trans fats. Vegetable oil. Reduced-fat, low-fat, or light mayonnaise and salad dressings (reduced-sodium). Canola, safflower, olive, avocado, soybean, and sunflower oils. Avocado. Seasonings and condiments Herbs. Spices. Seasoning mixes without salt. Other foods Unsalted popcorn and pretzels. Fat-free sweets. The items listed above may not be all the foods and drinks you can have. Talk to a dietitian to learn more. What foods should I avoid? Fruits Canned fruit in a light or heavy syrup. Fried fruit. Fruit in cream or butter sauce. Vegetables Creamed or fried vegetables. Vegetables in a cheese sauce. Regular canned vegetables that are not marked as low-sodium or reduced-sodium. Regular canned tomato sauce and paste that are not marked as low-sodium or reduced-sodium. Regular tomato and vegetable juices that are not marked as low-sodium or reduced-sodium. Rosita Fire. Olives. Grains Baked goods made with fat, such as croissants, muffins, or some breads. Dry pasta or rice meal packs. Meats and other proteins Fatty cuts of meat. Ribs. Fried meat. Tomasa Blase. Bologna, salami, and other precooked or cured meats, such as sausages or meat  loaves, that are not lean and low in sodium. Fat from the back of a pig (fatback). Bratwurst. Salted nuts and seeds. Canned beans with added salt. Canned or smoked fish. Whole eggs or egg yolks. Chicken or [place] with skin. Dairy Whole or 2% milk, cream, and half-and-half. Whole or full-fat cream cheese. Whole-fat or sweetened yogurt. Full-fat cheese. Nondairy creamers. Whipped toppings. Processed cheese and cheese spreads. Fats and oils Butter. Stick margarine. Lard. Shortening. Ghee. Bacon fat. Tropical oils, such as coconut, palm kernel, or palm oil. Seasonings and condiments Onion salt, garlic salt, seasoned salt, table salt, and sea salt. Worcestershire sauce. Tartar sauce. Barbecue sauce. Teriyaki sauce. Soy sauce,  including reduced-sodium soy sauce. Steak sauce. Canned and packaged gravies. Fish sauce. Oyster sauce. Cocktail sauce. Store-bought horseradish. Ketchup. Mustard. Meat flavorings and tenderizers. Bouillon cubes. Hot sauces. Pre-made or packaged marinades. Pre-made or packaged taco seasonings. Relishes. Regular salad dressings. Other foods Salted popcorn and pretzels. The items listed above may not be all the foods and drinks you should avoid. Talk to a dietitian to learn more. Where to find more information National Heart, Lung, and Blood Institute (NHLBI): BuffaloDryCleaner.gl American Heart Association (AHA): heart.org Academy of Nutrition and Dietetics: eatright.org National Kidney Foundation (NKF): kidney.org This information is not intended to replace advice given to you by your health care provider. Make sure you discuss any questions you have with your health care provider. Document Revised: 01/14/2022 Document Reviewed: 01/14/2022 Elsevier Patient Education  2024 [employer].

## 2022-08-24 DIAGNOSIS — N186 End stage renal disease: Secondary | ICD-10-CM | POA: Diagnosis not present

## 2022-08-24 DIAGNOSIS — N2581 Secondary hyperparathyroidism of renal origin: Secondary | ICD-10-CM | POA: Diagnosis not present

## 2022-08-24 DIAGNOSIS — Z992 Dependence on renal dialysis: Secondary | ICD-10-CM | POA: Diagnosis not present

## 2022-08-26 DIAGNOSIS — Z992 Dependence on renal dialysis: Secondary | ICD-10-CM | POA: Diagnosis not present

## 2022-08-26 DIAGNOSIS — N186 End stage renal disease: Secondary | ICD-10-CM | POA: Diagnosis not present

## 2022-08-26 DIAGNOSIS — N2581 Secondary hyperparathyroidism of renal origin: Secondary | ICD-10-CM | POA: Diagnosis not present

## 2022-08-28 DIAGNOSIS — N186 End stage renal disease: Secondary | ICD-10-CM | POA: Diagnosis not present

## 2022-08-28 DIAGNOSIS — Z992 Dependence on renal dialysis: Secondary | ICD-10-CM | POA: Diagnosis not present

## 2022-08-28 DIAGNOSIS — N2581 Secondary hyperparathyroidism of renal origin: Secondary | ICD-10-CM | POA: Diagnosis not present

## 2022-08-31 DIAGNOSIS — N2581 Secondary hyperparathyroidism of renal origin: Secondary | ICD-10-CM | POA: Diagnosis not present

## 2022-08-31 DIAGNOSIS — N186 End stage renal disease: Secondary | ICD-10-CM | POA: Diagnosis not present

## 2022-08-31 DIAGNOSIS — Z992 Dependence on renal dialysis: Secondary | ICD-10-CM | POA: Diagnosis not present

## 2022-09-02 DIAGNOSIS — N186 End stage renal disease: Secondary | ICD-10-CM | POA: Diagnosis not present

## 2022-09-02 DIAGNOSIS — Z992 Dependence on renal dialysis: Secondary | ICD-10-CM | POA: Diagnosis not present

## 2022-09-02 DIAGNOSIS — N2581 Secondary hyperparathyroidism of renal origin: Secondary | ICD-10-CM | POA: Diagnosis not present

## 2022-09-04 DIAGNOSIS — Z992 Dependence on renal dialysis: Secondary | ICD-10-CM | POA: Diagnosis not present

## 2022-09-04 DIAGNOSIS — N2581 Secondary hyperparathyroidism of renal origin: Secondary | ICD-10-CM | POA: Diagnosis not present

## 2022-09-04 DIAGNOSIS — N186 End stage renal disease: Secondary | ICD-10-CM | POA: Diagnosis not present

## 2022-09-06 DIAGNOSIS — E1122 Type 2 diabetes mellitus with diabetic chronic kidney disease: Secondary | ICD-10-CM | POA: Diagnosis not present

## 2022-09-06 DIAGNOSIS — N186 End stage renal disease: Secondary | ICD-10-CM | POA: Diagnosis not present

## 2022-09-06 DIAGNOSIS — Z01810 Encounter for preprocedural cardiovascular examination: Secondary | ICD-10-CM | POA: Diagnosis not present

## 2022-09-06 DIAGNOSIS — I517 Cardiomegaly: Secondary | ICD-10-CM | POA: Diagnosis not present

## 2022-09-06 DIAGNOSIS — I071 Rheumatic tricuspid insufficiency: Secondary | ICD-10-CM | POA: Diagnosis not present

## 2022-09-06 DIAGNOSIS — Z992 Dependence on renal dialysis: Secondary | ICD-10-CM | POA: Diagnosis not present

## 2022-09-06 DIAGNOSIS — I120 Hypertensive chronic kidney disease with stage 5 chronic kidney disease or end stage renal disease: Secondary | ICD-10-CM | POA: Diagnosis not present

## 2022-09-06 DIAGNOSIS — I340 Nonrheumatic mitral (valve) insufficiency: Secondary | ICD-10-CM | POA: Diagnosis not present

## 2022-09-07 DIAGNOSIS — N2581 Secondary hyperparathyroidism of renal origin: Secondary | ICD-10-CM | POA: Diagnosis not present

## 2022-09-07 DIAGNOSIS — Z992 Dependence on renal dialysis: Secondary | ICD-10-CM | POA: Diagnosis not present

## 2022-09-07 DIAGNOSIS — N186 End stage renal disease: Secondary | ICD-10-CM | POA: Diagnosis not present

## 2022-09-09 DIAGNOSIS — N2581 Secondary hyperparathyroidism of renal origin: Secondary | ICD-10-CM | POA: Diagnosis not present

## 2022-09-09 DIAGNOSIS — N186 End stage renal disease: Secondary | ICD-10-CM | POA: Diagnosis not present

## 2022-09-09 DIAGNOSIS — Z992 Dependence on renal dialysis: Secondary | ICD-10-CM | POA: Diagnosis not present

## 2022-09-10 DIAGNOSIS — R0689 Other abnormalities of breathing: Secondary | ICD-10-CM | POA: Diagnosis not present

## 2022-09-10 DIAGNOSIS — J984 Other disorders of lung: Secondary | ICD-10-CM | POA: Diagnosis not present

## 2022-09-10 DIAGNOSIS — Z87891 Personal history of nicotine dependence: Secondary | ICD-10-CM | POA: Diagnosis not present

## 2022-09-11 DIAGNOSIS — N2581 Secondary hyperparathyroidism of renal origin: Secondary | ICD-10-CM | POA: Diagnosis not present

## 2022-09-11 DIAGNOSIS — E1122 Type 2 diabetes mellitus with diabetic chronic kidney disease: Secondary | ICD-10-CM | POA: Diagnosis not present

## 2022-09-11 DIAGNOSIS — Z992 Dependence on renal dialysis: Secondary | ICD-10-CM | POA: Diagnosis not present

## 2022-09-11 DIAGNOSIS — N186 End stage renal disease: Secondary | ICD-10-CM | POA: Diagnosis not present

## 2022-09-14 DIAGNOSIS — N186 End stage renal disease: Secondary | ICD-10-CM | POA: Diagnosis not present

## 2022-09-14 DIAGNOSIS — N2581 Secondary hyperparathyroidism of renal origin: Secondary | ICD-10-CM | POA: Diagnosis not present

## 2022-09-14 DIAGNOSIS — Z992 Dependence on renal dialysis: Secondary | ICD-10-CM | POA: Diagnosis not present

## 2022-09-18 DIAGNOSIS — N2581 Secondary hyperparathyroidism of renal origin: Secondary | ICD-10-CM | POA: Diagnosis not present

## 2022-09-18 DIAGNOSIS — N186 End stage renal disease: Secondary | ICD-10-CM | POA: Diagnosis not present

## 2022-09-18 DIAGNOSIS — Z992 Dependence on renal dialysis: Secondary | ICD-10-CM | POA: Diagnosis not present

## 2022-09-21 DIAGNOSIS — N2581 Secondary hyperparathyroidism of renal origin: Secondary | ICD-10-CM | POA: Diagnosis not present

## 2022-09-21 DIAGNOSIS — N186 End stage renal disease: Secondary | ICD-10-CM | POA: Diagnosis not present

## 2022-09-21 DIAGNOSIS — Z992 Dependence on renal dialysis: Secondary | ICD-10-CM | POA: Diagnosis not present

## 2022-09-23 DIAGNOSIS — Z992 Dependence on renal dialysis: Secondary | ICD-10-CM | POA: Diagnosis not present

## 2022-09-23 DIAGNOSIS — N186 End stage renal disease: Secondary | ICD-10-CM | POA: Diagnosis not present

## 2022-09-23 DIAGNOSIS — N2581 Secondary hyperparathyroidism of renal origin: Secondary | ICD-10-CM | POA: Diagnosis not present

## 2022-09-25 DIAGNOSIS — Z992 Dependence on renal dialysis: Secondary | ICD-10-CM | POA: Diagnosis not present

## 2022-09-25 DIAGNOSIS — N186 End stage renal disease: Secondary | ICD-10-CM | POA: Diagnosis not present

## 2022-09-25 DIAGNOSIS — N2581 Secondary hyperparathyroidism of renal origin: Secondary | ICD-10-CM | POA: Diagnosis not present

## 2022-09-28 DIAGNOSIS — Z992 Dependence on renal dialysis: Secondary | ICD-10-CM | POA: Diagnosis not present

## 2022-09-28 DIAGNOSIS — N186 End stage renal disease: Secondary | ICD-10-CM | POA: Diagnosis not present

## 2022-09-28 DIAGNOSIS — N2581 Secondary hyperparathyroidism of renal origin: Secondary | ICD-10-CM | POA: Diagnosis not present

## 2022-09-30 DIAGNOSIS — Z992 Dependence on renal dialysis: Secondary | ICD-10-CM | POA: Diagnosis not present

## 2022-09-30 DIAGNOSIS — N186 End stage renal disease: Secondary | ICD-10-CM | POA: Diagnosis not present

## 2022-09-30 DIAGNOSIS — N2581 Secondary hyperparathyroidism of renal origin: Secondary | ICD-10-CM | POA: Diagnosis not present

## 2022-10-02 DIAGNOSIS — N186 End stage renal disease: Secondary | ICD-10-CM | POA: Diagnosis not present

## 2022-10-02 DIAGNOSIS — Z992 Dependence on renal dialysis: Secondary | ICD-10-CM | POA: Diagnosis not present

## 2022-10-02 DIAGNOSIS — N2581 Secondary hyperparathyroidism of renal origin: Secondary | ICD-10-CM | POA: Diagnosis not present

## 2022-10-05 DIAGNOSIS — Z992 Dependence on renal dialysis: Secondary | ICD-10-CM | POA: Diagnosis not present

## 2022-10-05 DIAGNOSIS — N2581 Secondary hyperparathyroidism of renal origin: Secondary | ICD-10-CM | POA: Diagnosis not present

## 2022-10-05 DIAGNOSIS — N186 End stage renal disease: Secondary | ICD-10-CM | POA: Diagnosis not present

## 2022-10-07 DIAGNOSIS — Z992 Dependence on renal dialysis: Secondary | ICD-10-CM | POA: Diagnosis not present

## 2022-10-07 DIAGNOSIS — N2581 Secondary hyperparathyroidism of renal origin: Secondary | ICD-10-CM | POA: Diagnosis not present

## 2022-10-07 DIAGNOSIS — N186 End stage renal disease: Secondary | ICD-10-CM | POA: Diagnosis not present

## 2022-10-11 DIAGNOSIS — E1122 Type 2 diabetes mellitus with diabetic chronic kidney disease: Secondary | ICD-10-CM | POA: Diagnosis not present

## 2022-10-11 DIAGNOSIS — Z992 Dependence on renal dialysis: Secondary | ICD-10-CM | POA: Diagnosis not present

## 2022-10-11 DIAGNOSIS — N186 End stage renal disease: Secondary | ICD-10-CM | POA: Diagnosis not present

## 2022-10-12 DIAGNOSIS — N2581 Secondary hyperparathyroidism of renal origin: Secondary | ICD-10-CM | POA: Diagnosis not present

## 2022-10-12 DIAGNOSIS — Z992 Dependence on renal dialysis: Secondary | ICD-10-CM | POA: Diagnosis not present

## 2022-10-12 DIAGNOSIS — N186 End stage renal disease: Secondary | ICD-10-CM | POA: Diagnosis not present

## 2022-10-14 DIAGNOSIS — Z992 Dependence on renal dialysis: Secondary | ICD-10-CM | POA: Diagnosis not present

## 2022-10-14 DIAGNOSIS — N186 End stage renal disease: Secondary | ICD-10-CM | POA: Diagnosis not present

## 2022-10-14 DIAGNOSIS — N2581 Secondary hyperparathyroidism of renal origin: Secondary | ICD-10-CM | POA: Diagnosis not present

## 2022-10-16 DIAGNOSIS — N186 End stage renal disease: Secondary | ICD-10-CM | POA: Diagnosis not present

## 2022-10-16 DIAGNOSIS — Z992 Dependence on renal dialysis: Secondary | ICD-10-CM | POA: Diagnosis not present

## 2022-10-16 DIAGNOSIS — N2581 Secondary hyperparathyroidism of renal origin: Secondary | ICD-10-CM | POA: Diagnosis not present

## 2022-10-19 DIAGNOSIS — Z992 Dependence on renal dialysis: Secondary | ICD-10-CM | POA: Diagnosis not present

## 2022-10-19 DIAGNOSIS — N2581 Secondary hyperparathyroidism of renal origin: Secondary | ICD-10-CM | POA: Diagnosis not present

## 2022-10-19 DIAGNOSIS — N186 End stage renal disease: Secondary | ICD-10-CM | POA: Diagnosis not present

## 2022-10-21 DIAGNOSIS — N186 End stage renal disease: Secondary | ICD-10-CM | POA: Diagnosis not present

## 2022-10-21 DIAGNOSIS — Z992 Dependence on renal dialysis: Secondary | ICD-10-CM | POA: Diagnosis not present

## 2022-10-21 DIAGNOSIS — N2581 Secondary hyperparathyroidism of renal origin: Secondary | ICD-10-CM | POA: Diagnosis not present

## 2022-10-23 DIAGNOSIS — N186 End stage renal disease: Secondary | ICD-10-CM | POA: Diagnosis not present

## 2022-10-23 DIAGNOSIS — N2581 Secondary hyperparathyroidism of renal origin: Secondary | ICD-10-CM | POA: Diagnosis not present

## 2022-10-23 DIAGNOSIS — Z992 Dependence on renal dialysis: Secondary | ICD-10-CM | POA: Diagnosis not present

## 2022-10-26 DIAGNOSIS — N186 End stage renal disease: Secondary | ICD-10-CM | POA: Diagnosis not present

## 2022-10-26 DIAGNOSIS — N2581 Secondary hyperparathyroidism of renal origin: Secondary | ICD-10-CM | POA: Diagnosis not present

## 2022-10-26 DIAGNOSIS — Z992 Dependence on renal dialysis: Secondary | ICD-10-CM | POA: Diagnosis not present

## 2022-10-28 DIAGNOSIS — Z992 Dependence on renal dialysis: Secondary | ICD-10-CM | POA: Diagnosis not present

## 2022-10-28 DIAGNOSIS — N186 End stage renal disease: Secondary | ICD-10-CM | POA: Diagnosis not present

## 2022-10-28 DIAGNOSIS — N2581 Secondary hyperparathyroidism of renal origin: Secondary | ICD-10-CM | POA: Diagnosis not present

## 2022-10-30 DIAGNOSIS — N186 End stage renal disease: Secondary | ICD-10-CM | POA: Diagnosis not present

## 2022-10-30 DIAGNOSIS — Z992 Dependence on renal dialysis: Secondary | ICD-10-CM | POA: Diagnosis not present

## 2022-10-30 DIAGNOSIS — N2581 Secondary hyperparathyroidism of renal origin: Secondary | ICD-10-CM | POA: Diagnosis not present

## 2022-11-02 DIAGNOSIS — N2581 Secondary hyperparathyroidism of renal origin: Secondary | ICD-10-CM | POA: Diagnosis not present

## 2022-11-02 DIAGNOSIS — R52 Pain, unspecified: Secondary | ICD-10-CM | POA: Diagnosis not present

## 2022-11-02 DIAGNOSIS — Z992 Dependence on renal dialysis: Secondary | ICD-10-CM | POA: Diagnosis not present

## 2022-11-02 DIAGNOSIS — N186 End stage renal disease: Secondary | ICD-10-CM | POA: Diagnosis not present

## 2022-11-05 DIAGNOSIS — R110 Nausea: Secondary | ICD-10-CM | POA: Diagnosis not present

## 2022-11-05 DIAGNOSIS — R0602 Shortness of breath: Secondary | ICD-10-CM | POA: Diagnosis not present

## 2022-11-05 DIAGNOSIS — R062 Wheezing: Secondary | ICD-10-CM | POA: Diagnosis not present

## 2022-11-06 DIAGNOSIS — R52 Pain, unspecified: Secondary | ICD-10-CM | POA: Diagnosis not present

## 2022-11-06 DIAGNOSIS — N186 End stage renal disease: Secondary | ICD-10-CM | POA: Diagnosis not present

## 2022-11-06 DIAGNOSIS — Z992 Dependence on renal dialysis: Secondary | ICD-10-CM | POA: Diagnosis not present

## 2022-11-06 DIAGNOSIS — N2581 Secondary hyperparathyroidism of renal origin: Secondary | ICD-10-CM | POA: Diagnosis not present

## 2022-11-09 DIAGNOSIS — N2581 Secondary hyperparathyroidism of renal origin: Secondary | ICD-10-CM | POA: Diagnosis not present

## 2022-11-09 DIAGNOSIS — R52 Pain, unspecified: Secondary | ICD-10-CM | POA: Diagnosis not present

## 2022-11-09 DIAGNOSIS — N186 End stage renal disease: Secondary | ICD-10-CM | POA: Diagnosis not present

## 2022-11-09 DIAGNOSIS — Z992 Dependence on renal dialysis: Secondary | ICD-10-CM | POA: Diagnosis not present

## 2022-11-11 DIAGNOSIS — N2581 Secondary hyperparathyroidism of renal origin: Secondary | ICD-10-CM | POA: Diagnosis not present

## 2022-11-11 DIAGNOSIS — N186 End stage renal disease: Secondary | ICD-10-CM | POA: Diagnosis not present

## 2022-11-11 DIAGNOSIS — Z992 Dependence on renal dialysis: Secondary | ICD-10-CM | POA: Diagnosis not present

## 2022-11-11 DIAGNOSIS — R52 Pain, unspecified: Secondary | ICD-10-CM | POA: Diagnosis not present

## 2022-11-11 DIAGNOSIS — E1122 Type 2 diabetes mellitus with diabetic chronic kidney disease: Secondary | ICD-10-CM | POA: Diagnosis not present

## 2022-11-13 DIAGNOSIS — N186 End stage renal disease: Secondary | ICD-10-CM | POA: Diagnosis not present

## 2022-11-13 DIAGNOSIS — Z992 Dependence on renal dialysis: Secondary | ICD-10-CM | POA: Diagnosis not present

## 2022-11-13 DIAGNOSIS — N2581 Secondary hyperparathyroidism of renal origin: Secondary | ICD-10-CM | POA: Diagnosis not present

## 2022-11-16 ENCOUNTER — Ambulatory Visit (INDEPENDENT_AMBULATORY_CARE_PROVIDER_SITE_OTHER): Payer: BC Managed Care – PPO | Admitting: Family

## 2022-11-16 ENCOUNTER — Encounter: Payer: Self-pay | Admitting: Family

## 2022-11-16 VITALS — BP 179/79 | HR 87 | Temp 97.9°F | Ht 64.0 in | Wt 193.6 lb

## 2022-11-16 DIAGNOSIS — Z1231 Encounter for screening mammogram for malignant neoplasm of breast: Secondary | ICD-10-CM

## 2022-11-16 DIAGNOSIS — E119 Type 2 diabetes mellitus without complications: Secondary | ICD-10-CM

## 2022-11-16 DIAGNOSIS — R221 Localized swelling, mass and lump, neck: Secondary | ICD-10-CM

## 2022-11-16 DIAGNOSIS — N186 End stage renal disease: Secondary | ICD-10-CM | POA: Diagnosis not present

## 2022-11-16 DIAGNOSIS — Z0001 Encounter for general adult medical examination with abnormal findings: Secondary | ICD-10-CM | POA: Diagnosis not present

## 2022-11-16 DIAGNOSIS — Z1329 Encounter for screening for other suspected endocrine disorder: Secondary | ICD-10-CM

## 2022-11-16 DIAGNOSIS — Z8639 Personal history of other endocrine, nutritional and metabolic disease: Secondary | ICD-10-CM

## 2022-11-16 DIAGNOSIS — N2581 Secondary hyperparathyroidism of renal origin: Secondary | ICD-10-CM | POA: Diagnosis not present

## 2022-11-16 DIAGNOSIS — Z0000 Encounter for general adult medical examination without abnormal findings: Secondary | ICD-10-CM

## 2022-11-16 DIAGNOSIS — Z0189 Encounter for other specified special examinations: Secondary | ICD-10-CM

## 2022-11-16 DIAGNOSIS — Z13228 Encounter for screening for other metabolic disorders: Secondary | ICD-10-CM | POA: Diagnosis not present

## 2022-11-16 DIAGNOSIS — R52 Pain, unspecified: Secondary | ICD-10-CM | POA: Diagnosis not present

## 2022-11-16 DIAGNOSIS — Z992 Dependence on renal dialysis: Secondary | ICD-10-CM | POA: Diagnosis not present

## 2022-11-16 NOTE — Progress Notes (Signed)
Patient ID: [name], female    DOB: [birth year]  MRN: [id number]  CC: Annual Exam  Subjective: [name] is a 62 y.o. female who presents for annual exam.   Her concerns today include:  - Knot of left neck present for several months. Reports she had evaluated in the past with no follow-up recommendations. She denies associated red flag symptoms.  - States she had dialysis on today and did not take blood pressure medications so that blood pressure would not "drop too low". States she plans to take blood pressure medications when she returns home today.   Patient Active Problem List   Diagnosis Date Noted   Encounter for immunization 02/23/2022   Type 2 diabetes mellitus (HCC) 02/04/2022   GERD (gastroesophageal reflux disease) 02/04/2022   Cyst of skin 01/08/2022   Morbid obesity due to excess calories (HCC) 01/08/2022   Pain in right upper arm 01/08/2022   Skin lesion 01/08/2022   Pre-transplant evaluation for kidney transplant 01/08/2022   Screening for cervical cancer 01/08/2022   Hospital discharge follow-up 11/10/2021   Encounter for screening for respiratory tuberculosis 10/31/2021   Pruritus, unspecified 10/31/2021   Iron deficiency anemia, unspecified 10/31/2021   Other disorders of phosphorus metabolism 10/31/2021   Pain, unspecified 10/31/2021   Secondary hyperparathyroidism of renal origin (HCC) 10/31/2021   End stage renal disease (HCC) 10/31/2021   Hypercalcemia 10/31/2021   Coagulation defect, unspecified (HCC) 10/31/2021   Diarrhea, unspecified 10/31/2021   Dyspnea, unspecified 10/31/2021   Anaphylactic shock, unspecified, initial encounter 10/31/2021   Allergy, unspecified, initial encounter 10/31/2021   Anemia in chronic kidney disease 10/29/2021   Chronic diastolic (congestive) heart failure (HCC) 10/29/2021   Atherosclerotic heart disease of native coronary artery without angina pectoris 10/29/2021   Polycystic kidney, unspecified 10/29/2021    Type 2 diabetes mellitus with diabetic chronic kidney disease (HCC) 10/29/2021   Long term (current) use of aspirin 10/29/2021   Personal history of nicotine dependence 10/29/2021   Hyperlipidemia 10/29/2021   Hypertensive heart and chronic kidney disease with heart failure and with stage 5 chronic kidney disease, or end stage renal disease (HCC) 10/29/2021   COPD (chronic obstructive pulmonary disease) (HCC) 10/29/2021   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) 10/29/2021   Hypertensive urgency 10/24/2021   CKD (chronic kidney disease), stage V (HCC) 10/24/2021   Hypertensive heart disease 08/13/2020   Diabetes mellitus (HCC) 08/13/2020   Dietary counseling and surveillance 03/12/2020   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   Complex renal cyst 11/29/2019   Acute on chronic diastolic heart failure (HCC) 11/13/2019   Edema 10/18/2019   Truncal obesity 10/18/2019   DM (diabetes mellitus), type 2 with renal complications (HCC)    Hx of adenomatous colonic polyps 06/11/2016   Diverticulosis 06/07/2016   Sleep apnea 03/18/2016   CRI (chronic renal insufficiency), stage 4 (severe) (HCC) 03/18/2016   Increased risk of breast cancer 12/17/2015   Hematoma of groin - right, s/p cath; stable on discharge 09/02/2012   Angina pectoris, unspecified (HCC) 08/31/2012   Obesity, Class III, BMI 40-49.9 (morbid obesity) (HCC) 08/31/2012   CAD (coronary artery disease) 04/15/2011   Abnormal stress test, Lexiscan myoview 03/25/2011-sm. anterior ischemia 04/15/2011   Essential hypertension 04/15/2011   Dyslipidemia 04/15/2011   Metabolic syndrome 04/15/2011   Tobacco abuse 04/15/2011   Family history of premature CAD 04/15/2011   Coronary atherosclerosis 04/15/2011     Current Outpatient Medications on File Prior to Visit  Medication Sig Dispense Refill   amLODipine (  NORVASC) 10 MG tablet Take 1 tablet (10 mg total) by mouth daily. 30 tablet 0   carvedilol (COREG) 12.5 MG tablet Take 1 tablet (12.5  mg total) by mouth 2 (two) times daily with a meal. 180 tablet 0   hydrALAZINE (APRESOLINE) 50 MG tablet Take 50 mg by mouth 2 (two) times daily.     sevelamer carbonate (RENVELA) 800 MG tablet Take 1,600 mg by mouth 3 (three) times daily with meals.     No current facility-administered medications on file prior to visit.    Allergies  Allergen Reactions   Bupropion Other (See Comments) and Nausea And Vomiting    Suicidal thoughts and increased depression    Suicidal thoughts and increased depression unknown Other reaction(s): Other Suicidal thoughts and increased depression Suicidal thoughts and increased depression  Suicidal thoughts and increased depression, unknown, Other reaction(s): Other, Suicidal thoughts and increased depression, Suicidal thoughts and increased depression   Doxazosin Mesylate     Increased heart rate   Ezetimibe Nausea And Vomiting    myalgias  myalgias, myalgias   Rosuvastatin Other (See Comments) and Nausea And Vomiting    myalgia  myalgia, myalgia   Simvastatin Nausea And Vomiting    myalgias  myalgias, myalgias   Wellbutrin [Bupropion Hcl] Other (See Comments)    Suicidal thoughts and increased depression   Doxazosin Nausea And Vomiting and Palpitations    Increased heart rate    Social History   Socioeconomic History   Marital status: Divorced    Spouse name: Not on file   Number of children: 1   Years of education: Not on file   Highest education level: Not on file  Occupational History   Occupation: Geophysicist/field seismologist: [employer]  Tobacco Use   Smoking status: Former    Current packs/day: 0.00    Average packs/day: 0.3 packs/day for 30.0 years (7.5 ttl pk-yrs)    Types: Cigarettes    Start date: 10/24/1991    Quit date: 10/23/2021    Years since quitting: 1.0   Smokeless tobacco: Never   Tobacco comments:    2-  weeks  Vaping Use   Vaping status: Never Used  Substance and Sexual Activity   Alcohol use: No     Alcohol/week: 0.0 standard drinks of alcohol   Drug use: No   Sexual activity: Not Currently    Partners: Male  Other Topics Concern   Not on file  Social History Narrative   Lives with her son and her aunt lives with them as well.    Social Determinants of Health   Financial Resource Strain: Medium Risk (10/24/2017)   Overall Financial Resource Strain (CARDIA)    Difficulty of Paying Living Expenses: Somewhat hard  Food Insecurity: No Food Insecurity (10/25/2021)   Hunger Vital Sign    Worried About Running Out of Food in the Last Year: Never true    Ran Out of Food in the Last Year: Never true  Transportation Needs: No Transportation Needs (10/25/2021)   PRAPARE - Administrator, Civil Service (Medical): No    Lack of Transportation (Non-Medical): No  Physical Activity: Inactive (10/24/2017)   Exercise Vital Sign    Days of Exercise per Week: 0 days    Minutes of Exercise per Session: 0 min  Stress: Stress Concern Present (10/24/2017)   [employer] of Occupational Health - Occupational Stress Questionnaire    Feeling of Stress : To some extent  Social Connections: Somewhat  Isolated (10/24/2017)   Social Connection and Isolation Panel [NHANES]    Frequency of Communication with Friends and Family: More than three times a week    Frequency of Social Gatherings with Friends and Family: Once a week    Attends Religious Services: 1 to 4 times per year    Active Member of [employer] or Organizations: No    Attends Banker Meetings: Never    Marital Status: Divorced  Catering manager Violence: Not At Risk (10/25/2021)   Humiliation, Afraid, Rape, and Kick questionnaire    Fear of Current or Ex-Partner: No    Emotionally Abused: No    Physically Abused: No    Sexually Abused: No    Family History  Problem Relation Age of Onset   Stroke Father    Hypertension Father    Coronary artery disease Brother    Diabetes Mother    Stroke Maternal  Grandmother    Cancer Maternal Grandmother        Breast cancer   Hypertension Maternal Grandfather    Heart attack Maternal Grandfather    Heart attack Maternal Aunt 41   Colon cancer Neg Hx    Esophageal cancer Neg Hx    Stomach cancer Neg Hx    Rectal cancer Neg Hx     Past Surgical History:  Procedure Laterality Date   APPENDECTOMY  1974   AV FISTULA PLACEMENT Left 02/19/2020   Procedure: LEFT ARM BRACHIOCEPHALIC  ARTERIOVENOUS (AV) FISTULA CREATION;  Surgeon: Maeola Harman, MD;  Location: [facility] OR;  Service: Vascular;  Laterality: Left;   CARDIAC CATHETERIZATION  02/07/2008   Recommendation-F/U stress test   CARDIAC CATHETERIZATION  04/16/2011   Recommendation-Increase medical therapy trial   CARDIOVASCULAR STRESS TEST  03/25/2011   Suspect subtle anterior septal ischemia   COLONOSCOPY     FISTULA SUPERFICIALIZATION Left 05/13/2020   Procedure: FISTULA SUPERFICIALIZATION LEFT;  Surgeon: Maeola Harman, MD;  Location: [facility] OR;  Service: Vascular;  Laterality: Left;   FRACTURE SURGERY Left 1972   forearm fracture   LEFT HEART CATHETERIZATION WITH CORONARY ANGIOGRAM N/A 04/16/2011   Procedure: LEFT HEART CATHETERIZATION WITH CORONARY ANGIOGRAM;  Surgeon: Lennette Bihari, MD;  Location: [facility] CATH LAB;  Service: Cardiovascular;  Laterality: N/A;   LEFT HEART CATHETERIZATION WITH CORONARY ANGIOGRAM N/A 09/01/2012   Procedure: LEFT HEART CATHETERIZATION WITH CORONARY ANGIOGRAM;  Surgeon: Chrystie Nose, MD;  Location: [facility] CATH LAB;  Service: Cardiovascular;  Laterality: N/A;   POLYPECTOMY     RENAL DOPPLER  06/05/2009   No evidence of significant diameter reduction   TRANSTHORACIC ECHOCARDIOGRAM  01/25/2008   EF 60-65%, Moderate concentric LVH   WRIST FRACTURE SURGERY Right ~ 1996    ROS: Review of Systems Negative except as stated above  PHYSICAL EXAM: BP (!) 179/79   Pulse 87   Temp 97.9 F (36.6 C) (Oral)   Ht 5\' 4"  (1.626 m)   Wt 193 lb 9.6 oz (87.8 kg)   LMP  12/14/2013   SpO2 94%   BMI 33.23 kg/m   Physical Exam HENT:     Head: Normocephalic and atraumatic.     Right Ear: Tympanic membrane, ear canal and external ear normal.     Left Ear: Tympanic membrane, ear canal and external ear normal.     Nose: Nose normal.     Mouth/Throat:     Mouth: Mucous membranes are moist.     Pharynx: Oropharynx is clear.  Eyes:     Extraocular  Movements: Extraocular movements intact.     Conjunctiva/sclera: Conjunctivae normal.     Pupils: Pupils are equal, round, and reactive to light.  Neck:     Thyroid: No thyroid mass, thyromegaly or thyroid tenderness.     Comments: Firm palpable nodule left lateral neck.  Cardiovascular:     Rate and Rhythm: Normal rate and regular rhythm.     Pulses: Normal pulses.     Heart sounds: Normal heart sounds.  Pulmonary:     Effort: Pulmonary effort is normal.     Breath sounds: Normal breath sounds.  Chest:     Comments: Patient declined.  Abdominal:     General: Bowel sounds are normal.     Palpations: Abdomen is soft.  Genitourinary:    Comments: Patient declined.  Musculoskeletal:        General: Normal range of motion.     Right shoulder: Normal.     Left shoulder: Normal.     Right upper arm: Normal.     Left upper arm: Normal.     Right elbow: Normal.     Left elbow: Normal.     Right forearm: Normal.     Left forearm: Normal.     Right wrist: Normal.     Left wrist: Normal.     Right hand: Normal.     Left hand: Normal.     Cervical back: Normal, normal range of motion and neck supple.     Thoracic back: Normal.     Lumbar back: Normal.     Right hip: Normal.     Left hip: Normal.     Right upper leg: Normal.     Left upper leg: Normal.     Right knee: Normal.     Left knee: Normal.     Right lower leg: Normal.     Left lower leg: Normal.     Right ankle: Normal.     Left ankle: Normal.     Right foot: Normal.     Left foot: Normal.  Skin:    General: Skin is warm and dry.      Capillary Refill: Capillary refill takes less than 2 seconds.  Neurological:     General: No focal deficit present.     Mental Status: She is alert and oriented to person, place, and time.  Psychiatric:        Mood and Affect: Mood normal.        Behavior: Behavior normal.     ASSESSMENT AND PLAN: 1. Annual physical exam - Counseled on 150 minutes of exercise per week as tolerated, healthy eating (including decreased daily intake of saturated fats, cholesterol, added sugars, sodium), STI prevention, and routine healthcare maintenance.  2. Screening for metabolic disorder - Routine screening.  - Hepatic Function Panel  3. History of diabetes mellitus - Routine screening.  - Hemoglobin A1c  4. Diabetic eye exam [facility]) - Referral to Ophthalmology for evaluation/management. - Ambulatory referral to Ophthalmology  5. Encounter for diabetic foot exam [facility]) - Referral to Podiatry for evaluation/management.  - Ambulatory referral to Podiatry  6. Thyroid disorder screen - Routine screening.  - TSH  7. Encounter for screening mammogram for malignant neoplasm of breast - Referral for breast cancer screening by mammogram.  - MM Digital Screening; Future  8. Neck nodule - Routine screening.  - US THYROID; Future   Patient was given the opportunity to ask questions.  Patient verbalized understanding of the plan and was able  to repeat key elements of the plan. Patient was given clear instructions to go to Emergency Department or return to medical center if symptoms don't improve, worsen, or new problems develop.The patient verbalized understanding.   Orders Placed This Encounter  Procedures   MM Digital Screening   US THYROID   Hepatic Function Panel   Hemoglobin A1c   TSH   Ambulatory referral to Podiatry   Ambulatory referral to Ophthalmology    Return in about 1 year (around 11/16/2023) for Physical per patient preference.  Rema Fendt, NP

## 2022-11-16 NOTE — Progress Notes (Signed)
Patient states a know on left side of neck.

## 2022-11-17 ENCOUNTER — Encounter (HOSPITAL_COMMUNITY): Payer: Self-pay

## 2022-11-17 LAB — HEMOGLOBIN A1C
Est. average glucose Bld gHb Est-mCnc: 103 mg/dL
Hgb A1c MFr Bld: 5.2 % (ref 4.8–5.6)

## 2022-11-17 LAB — HEPATIC FUNCTION PANEL
ALT: 15 [IU]/L (ref 0–32)
AST: 17 [IU]/L (ref 0–40)
Albumin: 4.1 g/dL (ref 3.8–4.9)
Alkaline Phosphatase: 79 [IU]/L (ref 44–121)
Bilirubin Total: 0.2 mg/dL (ref 0.0–1.2)
Bilirubin, Direct: 0.1 mg/dL (ref 0.00–0.40)
Total Protein: 7 g/dL (ref 6.0–8.5)

## 2022-11-17 LAB — TSH: TSH: 2.31 u[IU]/mL (ref 0.450–4.500)

## 2022-11-18 ENCOUNTER — Ambulatory Visit: Payer: BC Managed Care – PPO

## 2022-11-18 DIAGNOSIS — N186 End stage renal disease: Secondary | ICD-10-CM | POA: Diagnosis not present

## 2022-11-18 DIAGNOSIS — R52 Pain, unspecified: Secondary | ICD-10-CM | POA: Diagnosis not present

## 2022-11-18 DIAGNOSIS — N2581 Secondary hyperparathyroidism of renal origin: Secondary | ICD-10-CM | POA: Diagnosis not present

## 2022-11-18 DIAGNOSIS — Z992 Dependence on renal dialysis: Secondary | ICD-10-CM | POA: Diagnosis not present

## 2022-11-20 DIAGNOSIS — Z992 Dependence on renal dialysis: Secondary | ICD-10-CM | POA: Diagnosis not present

## 2022-11-20 DIAGNOSIS — R52 Pain, unspecified: Secondary | ICD-10-CM | POA: Diagnosis not present

## 2022-11-20 DIAGNOSIS — N2581 Secondary hyperparathyroidism of renal origin: Secondary | ICD-10-CM | POA: Diagnosis not present

## 2022-11-20 DIAGNOSIS — N186 End stage renal disease: Secondary | ICD-10-CM | POA: Diagnosis not present

## 2022-11-23 DIAGNOSIS — Z992 Dependence on renal dialysis: Secondary | ICD-10-CM | POA: Diagnosis not present

## 2022-11-23 DIAGNOSIS — N2581 Secondary hyperparathyroidism of renal origin: Secondary | ICD-10-CM | POA: Diagnosis not present

## 2022-11-23 DIAGNOSIS — N186 End stage renal disease: Secondary | ICD-10-CM | POA: Diagnosis not present

## 2022-11-25 DIAGNOSIS — N186 End stage renal disease: Secondary | ICD-10-CM | POA: Diagnosis not present

## 2022-11-25 DIAGNOSIS — Z992 Dependence on renal dialysis: Secondary | ICD-10-CM | POA: Diagnosis not present

## 2022-11-25 DIAGNOSIS — N2581 Secondary hyperparathyroidism of renal origin: Secondary | ICD-10-CM | POA: Diagnosis not present

## 2022-11-27 DIAGNOSIS — Z992 Dependence on renal dialysis: Secondary | ICD-10-CM | POA: Diagnosis not present

## 2022-11-27 DIAGNOSIS — N2581 Secondary hyperparathyroidism of renal origin: Secondary | ICD-10-CM | POA: Diagnosis not present

## 2022-11-27 DIAGNOSIS — N186 End stage renal disease: Secondary | ICD-10-CM | POA: Diagnosis not present

## 2022-11-30 DIAGNOSIS — Z992 Dependence on renal dialysis: Secondary | ICD-10-CM | POA: Diagnosis not present

## 2022-11-30 DIAGNOSIS — N2581 Secondary hyperparathyroidism of renal origin: Secondary | ICD-10-CM | POA: Diagnosis not present

## 2022-11-30 DIAGNOSIS — N186 End stage renal disease: Secondary | ICD-10-CM | POA: Diagnosis not present

## 2022-11-30 DIAGNOSIS — R52 Pain, unspecified: Secondary | ICD-10-CM | POA: Diagnosis not present

## 2022-12-02 DIAGNOSIS — N186 End stage renal disease: Secondary | ICD-10-CM | POA: Diagnosis not present

## 2022-12-02 DIAGNOSIS — N2581 Secondary hyperparathyroidism of renal origin: Secondary | ICD-10-CM | POA: Diagnosis not present

## 2022-12-02 DIAGNOSIS — Z992 Dependence on renal dialysis: Secondary | ICD-10-CM | POA: Diagnosis not present

## 2022-12-02 DIAGNOSIS — R52 Pain, unspecified: Secondary | ICD-10-CM | POA: Diagnosis not present

## 2022-12-04 DIAGNOSIS — Z992 Dependence on renal dialysis: Secondary | ICD-10-CM | POA: Diagnosis not present

## 2022-12-04 DIAGNOSIS — N186 End stage renal disease: Secondary | ICD-10-CM | POA: Diagnosis not present

## 2022-12-04 DIAGNOSIS — R52 Pain, unspecified: Secondary | ICD-10-CM | POA: Diagnosis not present

## 2022-12-04 DIAGNOSIS — N2581 Secondary hyperparathyroidism of renal origin: Secondary | ICD-10-CM | POA: Diagnosis not present

## 2022-12-06 DIAGNOSIS — N186 End stage renal disease: Secondary | ICD-10-CM | POA: Diagnosis not present

## 2022-12-06 DIAGNOSIS — N2581 Secondary hyperparathyroidism of renal origin: Secondary | ICD-10-CM | POA: Diagnosis not present

## 2022-12-06 DIAGNOSIS — Z992 Dependence on renal dialysis: Secondary | ICD-10-CM | POA: Diagnosis not present

## 2022-12-07 ENCOUNTER — Encounter: Payer: Self-pay | Admitting: Family

## 2022-12-07 NOTE — Telephone Encounter (Signed)
Recommendation from [facility].

## 2022-12-07 NOTE — Telephone Encounter (Signed)
Patient was called and said she's not a diabetic and do not need to see podiatrist.

## 2022-12-08 DIAGNOSIS — N186 End stage renal disease: Secondary | ICD-10-CM | POA: Diagnosis not present

## 2022-12-08 DIAGNOSIS — Z992 Dependence on renal dialysis: Secondary | ICD-10-CM | POA: Diagnosis not present

## 2022-12-08 DIAGNOSIS — N2581 Secondary hyperparathyroidism of renal origin: Secondary | ICD-10-CM | POA: Diagnosis not present

## 2022-12-11 DIAGNOSIS — N186 End stage renal disease: Secondary | ICD-10-CM | POA: Diagnosis not present

## 2022-12-11 DIAGNOSIS — E1122 Type 2 diabetes mellitus with diabetic chronic kidney disease: Secondary | ICD-10-CM | POA: Diagnosis not present

## 2022-12-11 DIAGNOSIS — Z992 Dependence on renal dialysis: Secondary | ICD-10-CM | POA: Diagnosis not present

## 2022-12-11 DIAGNOSIS — N2581 Secondary hyperparathyroidism of renal origin: Secondary | ICD-10-CM | POA: Diagnosis not present

## 2022-12-14 DIAGNOSIS — Z992 Dependence on renal dialysis: Secondary | ICD-10-CM | POA: Diagnosis not present

## 2022-12-14 DIAGNOSIS — R52 Pain, unspecified: Secondary | ICD-10-CM | POA: Diagnosis not present

## 2022-12-14 DIAGNOSIS — N2581 Secondary hyperparathyroidism of renal origin: Secondary | ICD-10-CM | POA: Diagnosis not present

## 2022-12-14 DIAGNOSIS — N186 End stage renal disease: Secondary | ICD-10-CM | POA: Diagnosis not present

## 2022-12-16 DIAGNOSIS — N2581 Secondary hyperparathyroidism of renal origin: Secondary | ICD-10-CM | POA: Diagnosis not present

## 2022-12-16 DIAGNOSIS — Z992 Dependence on renal dialysis: Secondary | ICD-10-CM | POA: Diagnosis not present

## 2022-12-16 DIAGNOSIS — N186 End stage renal disease: Secondary | ICD-10-CM | POA: Diagnosis not present

## 2022-12-16 DIAGNOSIS — R52 Pain, unspecified: Secondary | ICD-10-CM | POA: Diagnosis not present

## 2022-12-18 DIAGNOSIS — R52 Pain, unspecified: Secondary | ICD-10-CM | POA: Diagnosis not present

## 2022-12-18 DIAGNOSIS — N186 End stage renal disease: Secondary | ICD-10-CM | POA: Diagnosis not present

## 2022-12-18 DIAGNOSIS — Z992 Dependence on renal dialysis: Secondary | ICD-10-CM | POA: Diagnosis not present

## 2022-12-18 DIAGNOSIS — N2581 Secondary hyperparathyroidism of renal origin: Secondary | ICD-10-CM | POA: Diagnosis not present

## 2022-12-21 DIAGNOSIS — N2581 Secondary hyperparathyroidism of renal origin: Secondary | ICD-10-CM | POA: Diagnosis not present

## 2022-12-21 DIAGNOSIS — N186 End stage renal disease: Secondary | ICD-10-CM | POA: Diagnosis not present

## 2022-12-21 DIAGNOSIS — Z992 Dependence on renal dialysis: Secondary | ICD-10-CM | POA: Diagnosis not present

## 2022-12-25 DIAGNOSIS — Z992 Dependence on renal dialysis: Secondary | ICD-10-CM | POA: Diagnosis not present

## 2022-12-25 DIAGNOSIS — N186 End stage renal disease: Secondary | ICD-10-CM | POA: Diagnosis not present

## 2022-12-25 DIAGNOSIS — N2581 Secondary hyperparathyroidism of renal origin: Secondary | ICD-10-CM | POA: Diagnosis not present

## 2022-12-28 DIAGNOSIS — N186 End stage renal disease: Secondary | ICD-10-CM | POA: Diagnosis not present

## 2022-12-28 DIAGNOSIS — R52 Pain, unspecified: Secondary | ICD-10-CM | POA: Diagnosis not present

## 2022-12-28 DIAGNOSIS — Z992 Dependence on renal dialysis: Secondary | ICD-10-CM | POA: Diagnosis not present

## 2022-12-28 DIAGNOSIS — N2581 Secondary hyperparathyroidism of renal origin: Secondary | ICD-10-CM | POA: Diagnosis not present

## 2022-12-30 DIAGNOSIS — N2581 Secondary hyperparathyroidism of renal origin: Secondary | ICD-10-CM | POA: Diagnosis not present

## 2022-12-30 DIAGNOSIS — N186 End stage renal disease: Secondary | ICD-10-CM | POA: Diagnosis not present

## 2022-12-30 DIAGNOSIS — Z992 Dependence on renal dialysis: Secondary | ICD-10-CM | POA: Diagnosis not present

## 2022-12-30 DIAGNOSIS — R52 Pain, unspecified: Secondary | ICD-10-CM | POA: Diagnosis not present

## 2023-01-03 DIAGNOSIS — Z992 Dependence on renal dialysis: Secondary | ICD-10-CM | POA: Diagnosis not present

## 2023-01-03 DIAGNOSIS — N2581 Secondary hyperparathyroidism of renal origin: Secondary | ICD-10-CM | POA: Diagnosis not present

## 2023-01-03 DIAGNOSIS — R52 Pain, unspecified: Secondary | ICD-10-CM | POA: Diagnosis not present

## 2023-01-03 DIAGNOSIS — N186 End stage renal disease: Secondary | ICD-10-CM | POA: Diagnosis not present

## 2023-01-06 DIAGNOSIS — Z992 Dependence on renal dialysis: Secondary | ICD-10-CM | POA: Diagnosis not present

## 2023-01-06 DIAGNOSIS — N186 End stage renal disease: Secondary | ICD-10-CM | POA: Diagnosis not present

## 2023-01-06 DIAGNOSIS — N2581 Secondary hyperparathyroidism of renal origin: Secondary | ICD-10-CM | POA: Diagnosis not present

## 2023-01-06 DIAGNOSIS — R52 Pain, unspecified: Secondary | ICD-10-CM | POA: Diagnosis not present

## 2023-01-08 DIAGNOSIS — N186 End stage renal disease: Secondary | ICD-10-CM | POA: Diagnosis not present

## 2023-01-08 DIAGNOSIS — R52 Pain, unspecified: Secondary | ICD-10-CM | POA: Diagnosis not present

## 2023-01-08 DIAGNOSIS — N2581 Secondary hyperparathyroidism of renal origin: Secondary | ICD-10-CM | POA: Diagnosis not present

## 2023-01-08 DIAGNOSIS — Z992 Dependence on renal dialysis: Secondary | ICD-10-CM | POA: Diagnosis not present

## 2023-01-10 DIAGNOSIS — R52 Pain, unspecified: Secondary | ICD-10-CM | POA: Diagnosis not present

## 2023-01-10 DIAGNOSIS — Z992 Dependence on renal dialysis: Secondary | ICD-10-CM | POA: Diagnosis not present

## 2023-01-10 DIAGNOSIS — N186 End stage renal disease: Secondary | ICD-10-CM | POA: Diagnosis not present

## 2023-01-10 DIAGNOSIS — N2581 Secondary hyperparathyroidism of renal origin: Secondary | ICD-10-CM | POA: Diagnosis not present

## 2023-01-11 DIAGNOSIS — N186 End stage renal disease: Secondary | ICD-10-CM | POA: Diagnosis not present

## 2023-01-11 DIAGNOSIS — E1122 Type 2 diabetes mellitus with diabetic chronic kidney disease: Secondary | ICD-10-CM | POA: Diagnosis not present

## 2023-01-11 DIAGNOSIS — Z992 Dependence on renal dialysis: Secondary | ICD-10-CM | POA: Diagnosis not present

## 2023-01-13 DIAGNOSIS — N186 End stage renal disease: Secondary | ICD-10-CM | POA: Diagnosis not present

## 2023-01-13 DIAGNOSIS — N2581 Secondary hyperparathyroidism of renal origin: Secondary | ICD-10-CM | POA: Diagnosis not present

## 2023-01-13 DIAGNOSIS — Z992 Dependence on renal dialysis: Secondary | ICD-10-CM | POA: Diagnosis not present

## 2023-01-14 ENCOUNTER — Encounter: Payer: BC Managed Care – PPO | Admitting: Family

## 2023-01-15 DIAGNOSIS — N186 End stage renal disease: Secondary | ICD-10-CM | POA: Diagnosis not present

## 2023-01-15 DIAGNOSIS — N2581 Secondary hyperparathyroidism of renal origin: Secondary | ICD-10-CM | POA: Diagnosis not present

## 2023-01-15 DIAGNOSIS — Z992 Dependence on renal dialysis: Secondary | ICD-10-CM | POA: Diagnosis not present

## 2023-01-18 DIAGNOSIS — Z992 Dependence on renal dialysis: Secondary | ICD-10-CM | POA: Diagnosis not present

## 2023-01-18 DIAGNOSIS — N2581 Secondary hyperparathyroidism of renal origin: Secondary | ICD-10-CM | POA: Diagnosis not present

## 2023-01-18 DIAGNOSIS — N186 End stage renal disease: Secondary | ICD-10-CM | POA: Diagnosis not present

## 2023-01-21 DIAGNOSIS — N186 End stage renal disease: Secondary | ICD-10-CM | POA: Diagnosis not present

## 2023-01-21 DIAGNOSIS — N2581 Secondary hyperparathyroidism of renal origin: Secondary | ICD-10-CM | POA: Diagnosis not present

## 2023-01-21 DIAGNOSIS — Z992 Dependence on renal dialysis: Secondary | ICD-10-CM | POA: Diagnosis not present

## 2023-01-25 DIAGNOSIS — Z992 Dependence on renal dialysis: Secondary | ICD-10-CM | POA: Diagnosis not present

## 2023-01-25 DIAGNOSIS — N2581 Secondary hyperparathyroidism of renal origin: Secondary | ICD-10-CM | POA: Diagnosis not present

## 2023-01-25 DIAGNOSIS — N186 End stage renal disease: Secondary | ICD-10-CM | POA: Diagnosis not present

## 2023-01-27 DIAGNOSIS — N2581 Secondary hyperparathyroidism of renal origin: Secondary | ICD-10-CM | POA: Diagnosis not present

## 2023-01-27 DIAGNOSIS — N186 End stage renal disease: Secondary | ICD-10-CM | POA: Diagnosis not present

## 2023-01-27 DIAGNOSIS — Z992 Dependence on renal dialysis: Secondary | ICD-10-CM | POA: Diagnosis not present

## 2023-01-29 DIAGNOSIS — N186 End stage renal disease: Secondary | ICD-10-CM | POA: Diagnosis not present

## 2023-01-29 DIAGNOSIS — Z992 Dependence on renal dialysis: Secondary | ICD-10-CM | POA: Diagnosis not present

## 2023-01-29 DIAGNOSIS — N2581 Secondary hyperparathyroidism of renal origin: Secondary | ICD-10-CM | POA: Diagnosis not present

## 2023-02-01 DIAGNOSIS — R52 Pain, unspecified: Secondary | ICD-10-CM | POA: Diagnosis not present

## 2023-02-01 DIAGNOSIS — N186 End stage renal disease: Secondary | ICD-10-CM | POA: Diagnosis not present

## 2023-02-01 DIAGNOSIS — N2581 Secondary hyperparathyroidism of renal origin: Secondary | ICD-10-CM | POA: Diagnosis not present

## 2023-02-01 DIAGNOSIS — Z992 Dependence on renal dialysis: Secondary | ICD-10-CM | POA: Diagnosis not present

## 2023-02-03 DIAGNOSIS — Z992 Dependence on renal dialysis: Secondary | ICD-10-CM | POA: Diagnosis not present

## 2023-02-03 DIAGNOSIS — N186 End stage renal disease: Secondary | ICD-10-CM | POA: Diagnosis not present

## 2023-02-03 DIAGNOSIS — R52 Pain, unspecified: Secondary | ICD-10-CM | POA: Diagnosis not present

## 2023-02-03 DIAGNOSIS — N2581 Secondary hyperparathyroidism of renal origin: Secondary | ICD-10-CM | POA: Diagnosis not present

## 2023-02-04 DIAGNOSIS — Z1231 Encounter for screening mammogram for malignant neoplasm of breast: Secondary | ICD-10-CM | POA: Diagnosis not present

## 2023-02-05 DIAGNOSIS — N2581 Secondary hyperparathyroidism of renal origin: Secondary | ICD-10-CM | POA: Diagnosis not present

## 2023-02-05 DIAGNOSIS — Z992 Dependence on renal dialysis: Secondary | ICD-10-CM | POA: Diagnosis not present

## 2023-02-05 DIAGNOSIS — R52 Pain, unspecified: Secondary | ICD-10-CM | POA: Diagnosis not present

## 2023-02-05 DIAGNOSIS — N186 End stage renal disease: Secondary | ICD-10-CM | POA: Diagnosis not present

## 2023-02-07 ENCOUNTER — Telehealth: Payer: Self-pay | Admitting: Family

## 2023-02-08 DIAGNOSIS — Z992 Dependence on renal dialysis: Secondary | ICD-10-CM | POA: Diagnosis not present

## 2023-02-08 DIAGNOSIS — N186 End stage renal disease: Secondary | ICD-10-CM | POA: Diagnosis not present

## 2023-02-08 DIAGNOSIS — N2581 Secondary hyperparathyroidism of renal origin: Secondary | ICD-10-CM | POA: Diagnosis not present

## 2023-02-11 DIAGNOSIS — N186 End stage renal disease: Secondary | ICD-10-CM | POA: Diagnosis not present

## 2023-02-11 DIAGNOSIS — E1122 Type 2 diabetes mellitus with diabetic chronic kidney disease: Secondary | ICD-10-CM | POA: Diagnosis not present

## 2023-02-11 DIAGNOSIS — Z992 Dependence on renal dialysis: Secondary | ICD-10-CM | POA: Diagnosis not present

## 2023-02-12 DIAGNOSIS — N186 End stage renal disease: Secondary | ICD-10-CM | POA: Diagnosis not present

## 2023-02-12 DIAGNOSIS — N2581 Secondary hyperparathyroidism of renal origin: Secondary | ICD-10-CM | POA: Diagnosis not present

## 2023-02-12 DIAGNOSIS — Z992 Dependence on renal dialysis: Secondary | ICD-10-CM | POA: Diagnosis not present

## 2023-02-14 ENCOUNTER — Ambulatory Visit (INDEPENDENT_AMBULATORY_CARE_PROVIDER_SITE_OTHER): Payer: BC Managed Care – PPO | Admitting: Family

## 2023-02-14 VITALS — BP 227/91 | HR 79 | Temp 97.7°F | Ht 64.0 in | Wt 200.8 lb

## 2023-02-14 DIAGNOSIS — R221 Localized swelling, mass and lump, neck: Secondary | ICD-10-CM | POA: Diagnosis not present

## 2023-02-14 DIAGNOSIS — M62838 Other muscle spasm: Secondary | ICD-10-CM

## 2023-02-14 MED ORDER — CYCLOBENZAPRINE HCL 5 MG PO TABS: 5.0000 mg | ORAL_TABLET | Freq: Three times a day (TID) | ORAL | 1 refills | Status: DC | PRN

## 2023-02-14 NOTE — Progress Notes (Unsigned)
Patient states pain on right thigh, states it hurts when she walks.   Said if she uses heating pads it helps.

## 2023-02-14 NOTE — Progress Notes (Unsigned)
Patient ID: [name], female    DOB: 12/19/1960  MRN: [id number]  CC: No chief complaint on file.   Subjective: [name] is a 63 y.o. female who presents for Her concerns today include: ***  Patient Active Problem List   Diagnosis Date Noted   Encounter for immunization 02/23/2022   Type 2 diabetes mellitus (HCC) 02/04/2022   GERD (gastroesophageal reflux disease) 02/04/2022   Cyst of skin 01/08/2022   Morbid obesity due to excess calories (HCC) 01/08/2022   Pain in right upper arm 01/08/2022   Skin lesion 01/08/2022   Pre-transplant evaluation for kidney transplant 01/08/2022   Screening for cervical cancer 01/08/2022   Hospital discharge follow-up 11/10/2021   Encounter for screening for respiratory tuberculosis 10/31/2021   Pruritus, unspecified 10/31/2021   Iron deficiency anemia, unspecified 10/31/2021   Other disorders of phosphorus metabolism 10/31/2021   Pain, unspecified 10/31/2021   Secondary hyperparathyroidism of renal origin (HCC) 10/31/2021   End stage renal disease (HCC) 10/31/2021   Hypercalcemia 10/31/2021   Coagulation defect, unspecified (HCC) 10/31/2021   Diarrhea, unspecified 10/31/2021   Dyspnea, unspecified 10/31/2021   Anaphylactic shock, unspecified, initial encounter 10/31/2021   Allergy, unspecified, initial encounter 10/31/2021   Anemia in chronic kidney disease 10/29/2021   Chronic diastolic (congestive) heart failure (HCC) 10/29/2021   Atherosclerotic heart disease of native coronary artery without angina pectoris 10/29/2021   Polycystic kidney, unspecified 10/29/2021   Type 2 diabetes mellitus with diabetic chronic kidney disease (HCC) 10/29/2021   Long term (current) use of aspirin 10/29/2021   Personal history of nicotine dependence 10/29/2021   Hyperlipidemia 10/29/2021   Hypertensive heart and chronic kidney disease with heart failure and with stage 5 chronic kidney disease, or end stage renal disease (HCC) 10/29/2021    COPD (chronic obstructive pulmonary disease) (HCC) 10/29/2021   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) 10/29/2021   Hypertensive urgency 10/24/2021   CKD (chronic kidney disease), stage V (HCC) 10/24/2021   Hypertensive heart disease 08/13/2020   Diabetes mellitus (HCC) 08/13/2020   Dietary counseling and surveillance 03/12/2020   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   Complex renal cyst 11/29/2019   Acute on chronic diastolic heart failure (HCC) 11/13/2019   Edema 10/18/2019   Truncal obesity 10/18/2019   DM (diabetes mellitus), type 2 with renal complications (HCC)    Hx of adenomatous colonic polyps 06/11/2016   Diverticulosis 06/07/2016   Sleep apnea 03/18/2016   CRI (chronic renal insufficiency), stage 4 (severe) (HCC) 03/18/2016   Increased risk of breast cancer 12/17/2015   Hematoma of groin - right, s/p cath; stable on discharge 09/02/2012   Angina pectoris, unspecified (HCC) 08/31/2012   Obesity, Class III, BMI 40-49.9 (morbid obesity) (HCC) 08/31/2012   CAD (coronary artery disease) 04/15/2011   Abnormal stress test, Lexiscan myoview 03/25/2011-sm. anterior ischemia 04/15/2011   Essential hypertension 04/15/2011   Dyslipidemia 04/15/2011   Metabolic syndrome 04/15/2011   Tobacco abuse 04/15/2011   Family history of premature CAD 04/15/2011   Coronary atherosclerosis 04/15/2011     Current Outpatient Medications on File Prior to Visit  Medication Sig Dispense Refill   amLODipine (NORVASC) 10 MG tablet Take 1 tablet (10 mg total) by mouth daily. 30 tablet 0   carvedilol (COREG) 12.5 MG tablet Take 1 tablet (12.5 mg total) by mouth 2 (two) times daily with a meal. 180 tablet 0   hydrALAZINE (APRESOLINE) 50 MG tablet Take 50 mg by mouth 2 (two) times daily.     sevelamer carbonate (RENVELA)  800 MG tablet Take 1,600 mg by mouth 3 (three) times daily with meals.     No current facility-administered medications on file prior to visit.    Allergies  Allergen Reactions    Bupropion Other (See Comments) and Nausea And Vomiting    Suicidal thoughts and increased depression    Suicidal thoughts and increased depression unknown Other reaction(s): Other Suicidal thoughts and increased depression Suicidal thoughts and increased depression  Suicidal thoughts and increased depression, unknown, Other reaction(s): Other, Suicidal thoughts and increased depression, Suicidal thoughts and increased depression   Doxazosin Mesylate     Increased heart rate   Ezetimibe Nausea And Vomiting    myalgias  myalgias, myalgias   Rosuvastatin Other (See Comments) and Nausea And Vomiting    myalgia  myalgia, myalgia   Simvastatin Nausea And Vomiting    myalgias  myalgias, myalgias   Wellbutrin [Bupropion Hcl] Other (See Comments)    Suicidal thoughts and increased depression   Doxazosin Nausea And Vomiting and Palpitations    Increased heart rate    Social History   Socioeconomic History   Marital status: Divorced    Spouse name: Not on file   Number of children: 1   Years of education: Not on file   Highest education level: Bachelor's degree (e.g., BA, AB, BS)  Occupational History   Occupation: Geophysicist/field seismologist: [employer]  Tobacco Use   Smoking status: Former    Current packs/day: 0.00    Average packs/day: 0.3 packs/day for 30.0 years (7.5 ttl pk-yrs)    Types: Cigarettes    Start date: 10/24/1991    Quit date: 10/23/2021    Years since quitting: 1.3   Smokeless tobacco: Never   Tobacco comments:    2-  weeks  Vaping Use   Vaping status: Never Used  Substance and Sexual Activity   Alcohol use: No    Alcohol/week: 0.0 standard drinks of alcohol   Drug use: No   Sexual activity: Not Currently    Partners: Male  Other Topics Concern   Not on file  Social History Narrative   Lives with her son and her aunt lives with them as well.    Social Drivers of Corporate investment banker Strain: Low Risk  (02/14/2023)   Overall Financial Resource  Strain (CARDIA)    Difficulty of Paying Living Expenses: Not very hard  Food Insecurity: No Food Insecurity (02/14/2023)   Hunger Vital Sign    Worried About Running Out of Food in the Last Year: Never true    Ran Out of Food in the Last Year: Never true  Transportation Needs: No Transportation Needs (02/14/2023)   PRAPARE - Administrator, Civil Service (Medical): No    Lack of Transportation (Non-Medical): No  Physical Activity: Unknown (02/14/2023)   Exercise Vital Sign    Days of Exercise per Week: 0 days    Minutes of Exercise per Session: Not on file  Stress: No Stress Concern Present (02/14/2023)   [employer] of Occupational Health - Occupational Stress Questionnaire    Feeling of Stress : Only a little  Social Connections: Unknown (02/14/2023)   Social Connection and Isolation Panel [NHANES]    Frequency of Communication with Friends and Family: Twice a week    Frequency of Social Gatherings with Friends and Family: Patient declined    Attends Religious Services: 1 to 4 times per year    Active Member of Clubs or Organizations: No  Attends Banker Meetings: Not on file    Marital Status: Divorced  Intimate Partner Violence: Not At Risk (10/25/2021)   Humiliation, Afraid, Rape, and Kick questionnaire    Fear of Current or Ex-Partner: No    Emotionally Abused: No    Physically Abused: No    Sexually Abused: No    Family History  Problem Relation Age of Onset   Stroke Father    Hypertension Father    Coronary artery disease Brother    Diabetes Mother    Stroke Maternal Grandmother    Cancer Maternal Grandmother        Breast cancer   Hypertension Maternal Grandfather    Heart attack Maternal Grandfather    Heart attack Maternal Aunt 61   Colon cancer Neg Hx    Esophageal cancer Neg Hx    Stomach cancer Neg Hx    Rectal cancer Neg Hx     Past Surgical History:  Procedure Laterality Date   APPENDECTOMY  1974   AV FISTULA PLACEMENT Left  02/19/2020   Procedure: LEFT ARM BRACHIOCEPHALIC  ARTERIOVENOUS (AV) FISTULA CREATION;  Surgeon: Maeola Harman, MD;  Location: [facility] OR;  Service: Vascular;  Laterality: Left;   CARDIAC CATHETERIZATION  02/07/2008   Recommendation-F/U stress test   CARDIAC CATHETERIZATION  04/16/2011   Recommendation-Increase medical therapy trial   CARDIOVASCULAR STRESS TEST  03/25/2011   Suspect subtle anterior septal ischemia   COLONOSCOPY     FISTULA SUPERFICIALIZATION Left 05/13/2020   Procedure: FISTULA SUPERFICIALIZATION LEFT;  Surgeon: Maeola Harman, MD;  Location[facility] OR;  Service: Vascular;  Laterality: Left;   FRACTURE SURGERY Left 1972   forearm fracture   LEFT HEART CATHETERIZATION WITH CORONARY ANGIOGRAM N/A 04/16/2011   Procedure: LEFT HEART CATHETERIZATION WITH CORONARY ANGIOGRAM;  Surgeon: Lennette Bihari, MD;  Location: [facility] CATH LAB;  Service: Cardiovascular;  Laterality: N/A;   LEFT HEART CATHETERIZATION WITH CORONARY ANGIOGRAM N/A 09/01/2012   Procedure: LEFT HEART CATHETERIZATION WITH CORONARY ANGIOGRAM;  Surgeon: Chrystie Nose, MD;  Location: [facility] CATH LAB;  Service: Cardiovascular;  Laterality: N/A;   POLYPECTOMY     RENAL DOPPLER  06/05/2009   No evidence of significant diameter reduction   TRANSTHORACIC ECHOCARDIOGRAM  01/25/2008   EF 60-65%, Moderate concentric LVH   WRIST FRACTURE SURGERY Right ~ 1996    ROS: Review of Systems Negative except as stated above  PHYSICAL EXAM: LMP 12/14/2013   Physical Exam  {female adult master:310786} {female adult master:310785}     Latest Ref Rng & Units 11/16/2022    2:42 PM 10/29/2021   12:33 AM 10/28/2021   10:00 AM  CMP  Glucose 70 - 99 mg/dL  782  956   BUN 6 - 20 mg/dL  51  37   Creatinine 2.13 - 1.00 mg/dL  0.86  5.78   Sodium 469 - 145 mmol/L  136  137   Potassium 3.5 - 5.1 mmol/L  4.1  3.4   Chloride 98 - 111 mmol/L  90  95   CO2 22 - 32 mmol/L  29  29   Calcium 8.9 - 10.3 mg/dL  8.3  8.4   Total Protein 6.0 -  8.5 g/dL 7.0  6.4  6.4   Total Bilirubin 0.0 - 1.2 mg/dL 0.2  0.3  0.2   Alkaline Phos 44 - 121 IU/L 79  32  32   AST 0 - 40 IU/L 17  19  22    ALT 0 - 32  IU/L 15  18  19     Lipid Panel     Component Value Date/Time   CHOL 183 10/05/2018 0923   TRIG 251 (H) 10/05/2018 0923   HDL 29 (L) 10/05/2018 0923   CHOLHDL 6.3 (H) 10/05/2018 0923   CHOLHDL 7.0 (H) 04/01/2016 0847   VLDL 52 (H) 04/01/2016 0847   LDLCALC 110 (H) 10/05/2018 0923    CBC    Component Value Date/Time   WBC 10.2 10/29/2021 0033   RBC 3.04 (L) 10/29/2021 0033   HGB 8.8 (L) 10/29/2021 0033   HGB 11.1 08/10/2020 1512   HCT 28.2 (L) 10/29/2021 0033   HCT 33.8 (L) 08/10/2020 1512   PLT 212 10/29/2021 0033   PLT 226 08/10/2020 1512   MCV 92.8 10/29/2021 0033   MCV 87 08/10/2020 1512   MCH 28.9 10/29/2021 0033   MCHC 31.2 10/29/2021 0033   RDW 15.1 10/29/2021 0033   RDW 13.5 08/10/2020 1512   LYMPHSABS 1.4 10/24/2021 1738   LYMPHSABS 2.0 08/10/2020 1512   MONOABS 0.7 10/24/2021 1738   EOSABS 0.5 10/24/2021 1738   EOSABS 0.5 (H) 08/10/2020 1512   BASOSABS 0.1 10/24/2021 1738   BASOSABS 0.1 08/10/2020 1512    ASSESSMENT AND PLAN:  There are no diagnoses linked to this encounter.   Patient was given the opportunity to ask questions.  Patient verbalized understanding of the plan and was able to repeat key elements of the plan. Patient was given clear instructions to go to Emergency Department or return to medical center if symptoms don't improve, worsen, or new problems develop.The patient verbalized understanding.   No orders of the defined types were placed in this encounter.    Requested Prescriptions    No prescriptions requested or ordered in this encounter    No follow-ups on file.  Rema Fendt, NP

## 2023-02-15 DIAGNOSIS — N186 End stage renal disease: Secondary | ICD-10-CM | POA: Diagnosis not present

## 2023-02-15 DIAGNOSIS — N2581 Secondary hyperparathyroidism of renal origin: Secondary | ICD-10-CM | POA: Diagnosis not present

## 2023-02-15 DIAGNOSIS — Z992 Dependence on renal dialysis: Secondary | ICD-10-CM | POA: Diagnosis not present

## 2023-02-17 DIAGNOSIS — N2581 Secondary hyperparathyroidism of renal origin: Secondary | ICD-10-CM | POA: Diagnosis not present

## 2023-02-17 DIAGNOSIS — N186 End stage renal disease: Secondary | ICD-10-CM | POA: Diagnosis not present

## 2023-02-17 DIAGNOSIS — Z992 Dependence on renal dialysis: Secondary | ICD-10-CM | POA: Diagnosis not present

## 2023-02-18 ENCOUNTER — Other Ambulatory Visit: Payer: Self-pay | Admitting: Family

## 2023-02-18 DIAGNOSIS — M62838 Other muscle spasm: Secondary | ICD-10-CM

## 2023-02-18 NOTE — Telephone Encounter (Signed)
 Copied from CRM [id number]. Topic: Clinical - Medication Refill >> Feb 18, 2023 10:46 AM Bascom RAMAN wrote: Most Recent Primary Care Visit:  Provider: LORREN NO J  Department: [facility]  Visit Type: OFFICE VISIT  Date: 02/14/2023  Medication: cyclobenzaprine  (FLEXERIL ) 5 MG tablet  Has the patient contacted their pharmacy? Yes (Agent: If no, request that the patient contact the pharmacy for the refill. If patient does not wish to contact the pharmacy document the reason why and proceed with request.) (Agent: If yes, when and what did the pharmacy advise?) [facility] won't fill the prescription because it is for a 30-day supply  Is this the correct pharmacy for this prescription? Yes If no, delete pharmacy and type the correct one.  This is the patient's preferred pharmacy:  [id number] [place], [place] - [address] AT [address] [place] [address] Phone: [phone] Fax: [phone]   Has the prescription been filled recently? No  Is the patient out of the medication? Yes  Has the patient been seen for an appointment in the last year OR does the patient have an upcoming appointment? Yes  Can we respond through MyChart? Yes  Agent: Please be advised that Rx refills may take up to 3 business days. We ask that you follow-up with your pharmacy.

## 2023-02-19 DIAGNOSIS — Z992 Dependence on renal dialysis: Secondary | ICD-10-CM | POA: Diagnosis not present

## 2023-02-19 DIAGNOSIS — N186 End stage renal disease: Secondary | ICD-10-CM | POA: Diagnosis not present

## 2023-02-19 DIAGNOSIS — N2581 Secondary hyperparathyroidism of renal origin: Secondary | ICD-10-CM | POA: Diagnosis not present

## 2023-02-21 ENCOUNTER — Ambulatory Visit
Admission: RE | Admit: 2023-02-21 | Discharge: 2023-02-21 | Disposition: A | Discharge status: Home / Self Care | Payer: BC Managed Care – PPO | Source: Ambulatory Visit | Attending: Family | Admitting: Family

## 2023-02-21 DIAGNOSIS — E041 Nontoxic single thyroid nodule: Secondary | ICD-10-CM | POA: Diagnosis not present

## 2023-02-22 ENCOUNTER — Other Ambulatory Visit: Payer: Self-pay | Admitting: Family

## 2023-02-22 ENCOUNTER — Encounter: Payer: Self-pay | Admitting: Family

## 2023-02-22 DIAGNOSIS — Z992 Dependence on renal dialysis: Secondary | ICD-10-CM | POA: Diagnosis not present

## 2023-02-22 DIAGNOSIS — N186 End stage renal disease: Secondary | ICD-10-CM | POA: Diagnosis not present

## 2023-02-22 DIAGNOSIS — N2581 Secondary hyperparathyroidism of renal origin: Secondary | ICD-10-CM | POA: Diagnosis not present

## 2023-02-22 DIAGNOSIS — R221 Localized swelling, mass and lump, neck: Secondary | ICD-10-CM

## 2023-02-24 DIAGNOSIS — N2581 Secondary hyperparathyroidism of renal origin: Secondary | ICD-10-CM | POA: Diagnosis not present

## 2023-02-24 DIAGNOSIS — N186 End stage renal disease: Secondary | ICD-10-CM | POA: Diagnosis not present

## 2023-02-24 DIAGNOSIS — Z992 Dependence on renal dialysis: Secondary | ICD-10-CM | POA: Diagnosis not present

## 2023-02-25 ENCOUNTER — Ambulatory Visit (INDEPENDENT_AMBULATORY_CARE_PROVIDER_SITE_OTHER): Payer: BC Managed Care – PPO | Admitting: Podiatry

## 2023-02-25 ENCOUNTER — Encounter: Payer: Self-pay | Admitting: Podiatry

## 2023-02-25 DIAGNOSIS — Q828 Other specified congenital malformations of skin: Secondary | ICD-10-CM | POA: Diagnosis not present

## 2023-02-25 NOTE — Patient Instructions (Signed)
Look for urea 40% cream or ointment and apply to the thickened dry skin / calluses. This can be bought over the counter, at a pharmacy or online such as [employer].  Can also look for combination product with salicylic acid  Some examples of over-the-counter urea cream and lower concentrations include Lac-Hydrin and AmLactin  More silicone or pads can be purchased from:  [email or web address]

## 2023-02-25 NOTE — Progress Notes (Signed)
  Subjective:  Patient ID: [name], female    DOB: September 28, 1960,  MRN: [id number]  Chief Complaint  Patient presents with   Foot Pain    Patient states that she has little dots at the bottom of both feet. They are not painful but the hard spot on on the top corners of bilateral feet. Patient has Callouses on bilateral feet    63 y.o. female presents with the above complaint. History confirmed with patient. Presenting with painful skin lesions to both feet. Patient denies history of T2DM, states she is prediabetic. Patient does have callus present located at the bilateral subfifth met, bilateral subthird met, right foot heel lesions x 2, sub 5th met base x 1, left heel x1 causing pain. History of ESRD on diaylsis  Objective:  Physical Exam: warm, good capillary refill nail exam normal nails without lesions DP pulses palpable, PT pulses palpable, and protective sensation intact Painful nucleated hyperkeratotic lesions bilateral subfifth met, bilateral subthird met, right foot heel lesions x 2, sub 5th met base x 1, left heel x1  Assessment:   1. Porokeratosis      Plan:  Patient was evaluated and treated and all questions answered.  #Hyperkeratotic lesions/pre ulcerative calluses present bilateral subfifth met, bilateral subthird met, right foot heel lesions x 2, sub 5th met base x 1, left heel x1 All symptomatic hyperkeratoses x 8 separate lesions were safely debrided as a courtesy today with a sterile #10 blade to patient's level of comfort without incident. We discussed preventative and palliative care of these lesions including supportive and accommodative shoegear, padding, prefabricated and custom molded accommodative orthoses, use of a pumice stone and lotions/creams daily. -Discussed home management of lesions today. Will need ABN going forward for this going forward   Return if symptoms worsen or fail to improve.         Bronwen Betters, DPM [facility] /  [facility]

## 2023-02-26 DIAGNOSIS — N2581 Secondary hyperparathyroidism of renal origin: Secondary | ICD-10-CM | POA: Diagnosis not present

## 2023-02-26 DIAGNOSIS — Z992 Dependence on renal dialysis: Secondary | ICD-10-CM | POA: Diagnosis not present

## 2023-02-26 DIAGNOSIS — N186 End stage renal disease: Secondary | ICD-10-CM | POA: Diagnosis not present

## 2023-02-28 ENCOUNTER — Encounter: Payer: Self-pay | Admitting: Family

## 2023-02-28 DIAGNOSIS — M62838 Other muscle spasm: Secondary | ICD-10-CM

## 2023-03-01 DIAGNOSIS — N186 End stage renal disease: Secondary | ICD-10-CM | POA: Diagnosis not present

## 2023-03-01 DIAGNOSIS — N2581 Secondary hyperparathyroidism of renal origin: Secondary | ICD-10-CM | POA: Diagnosis not present

## 2023-03-01 DIAGNOSIS — Z992 Dependence on renal dialysis: Secondary | ICD-10-CM | POA: Diagnosis not present

## 2023-03-03 ENCOUNTER — Other Ambulatory Visit: Payer: Self-pay | Admitting: Family

## 2023-03-03 DIAGNOSIS — N2581 Secondary hyperparathyroidism of renal origin: Secondary | ICD-10-CM | POA: Diagnosis not present

## 2023-03-03 DIAGNOSIS — M62838 Other muscle spasm: Secondary | ICD-10-CM

## 2023-03-03 DIAGNOSIS — Z992 Dependence on renal dialysis: Secondary | ICD-10-CM | POA: Diagnosis not present

## 2023-03-03 DIAGNOSIS — N186 End stage renal disease: Secondary | ICD-10-CM | POA: Diagnosis not present

## 2023-03-03 MED ORDER — CYCLOBENZAPRINE HCL 5 MG PO TABS
5.0000 mg | ORAL_TABLET | Freq: Three times a day (TID) | ORAL | 1 refills | Status: DC | PRN
Start: 2023-03-03 — End: 2023-11-11

## 2023-03-03 NOTE — Telephone Encounter (Signed)
 Complete

## 2023-03-05 DIAGNOSIS — N2581 Secondary hyperparathyroidism of renal origin: Secondary | ICD-10-CM | POA: Diagnosis not present

## 2023-03-05 DIAGNOSIS — Z992 Dependence on renal dialysis: Secondary | ICD-10-CM | POA: Diagnosis not present

## 2023-03-05 DIAGNOSIS — N186 End stage renal disease: Secondary | ICD-10-CM | POA: Diagnosis not present

## 2023-03-10 DIAGNOSIS — N2581 Secondary hyperparathyroidism of renal origin: Secondary | ICD-10-CM | POA: Diagnosis not present

## 2023-03-10 DIAGNOSIS — Z992 Dependence on renal dialysis: Secondary | ICD-10-CM | POA: Diagnosis not present

## 2023-03-10 DIAGNOSIS — N186 End stage renal disease: Secondary | ICD-10-CM | POA: Diagnosis not present

## 2023-03-11 DIAGNOSIS — Z992 Dependence on renal dialysis: Secondary | ICD-10-CM | POA: Diagnosis not present

## 2023-03-11 DIAGNOSIS — N186 End stage renal disease: Secondary | ICD-10-CM | POA: Diagnosis not present

## 2023-03-11 DIAGNOSIS — E1122 Type 2 diabetes mellitus with diabetic chronic kidney disease: Secondary | ICD-10-CM | POA: Diagnosis not present

## 2023-03-12 DIAGNOSIS — Z992 Dependence on renal dialysis: Secondary | ICD-10-CM | POA: Diagnosis not present

## 2023-03-12 DIAGNOSIS — N2581 Secondary hyperparathyroidism of renal origin: Secondary | ICD-10-CM | POA: Diagnosis not present

## 2023-03-12 DIAGNOSIS — N186 End stage renal disease: Secondary | ICD-10-CM | POA: Diagnosis not present

## 2023-03-15 DIAGNOSIS — N186 End stage renal disease: Secondary | ICD-10-CM | POA: Diagnosis not present

## 2023-03-15 DIAGNOSIS — Z992 Dependence on renal dialysis: Secondary | ICD-10-CM | POA: Diagnosis not present

## 2023-03-15 DIAGNOSIS — N2581 Secondary hyperparathyroidism of renal origin: Secondary | ICD-10-CM | POA: Diagnosis not present

## 2023-03-17 DIAGNOSIS — Z992 Dependence on renal dialysis: Secondary | ICD-10-CM | POA: Diagnosis not present

## 2023-03-17 DIAGNOSIS — N186 End stage renal disease: Secondary | ICD-10-CM | POA: Diagnosis not present

## 2023-03-17 DIAGNOSIS — N2581 Secondary hyperparathyroidism of renal origin: Secondary | ICD-10-CM | POA: Diagnosis not present

## 2023-03-19 DIAGNOSIS — N2581 Secondary hyperparathyroidism of renal origin: Secondary | ICD-10-CM | POA: Diagnosis not present

## 2023-03-19 DIAGNOSIS — Z992 Dependence on renal dialysis: Secondary | ICD-10-CM | POA: Diagnosis not present

## 2023-03-19 DIAGNOSIS — N186 End stage renal disease: Secondary | ICD-10-CM | POA: Diagnosis not present

## 2023-03-21 DIAGNOSIS — K118 Other diseases of salivary glands: Secondary | ICD-10-CM | POA: Diagnosis not present

## 2023-03-21 DIAGNOSIS — I150 Renovascular hypertension: Secondary | ICD-10-CM | POA: Diagnosis not present

## 2023-03-22 DIAGNOSIS — N2581 Secondary hyperparathyroidism of renal origin: Secondary | ICD-10-CM | POA: Diagnosis not present

## 2023-03-22 DIAGNOSIS — N186 End stage renal disease: Secondary | ICD-10-CM | POA: Diagnosis not present

## 2023-03-22 DIAGNOSIS — Z992 Dependence on renal dialysis: Secondary | ICD-10-CM | POA: Diagnosis not present

## 2023-03-22 DIAGNOSIS — R52 Pain, unspecified: Secondary | ICD-10-CM | POA: Diagnosis not present

## 2023-03-24 DIAGNOSIS — R52 Pain, unspecified: Secondary | ICD-10-CM | POA: Diagnosis not present

## 2023-03-24 DIAGNOSIS — Z992 Dependence on renal dialysis: Secondary | ICD-10-CM | POA: Diagnosis not present

## 2023-03-24 DIAGNOSIS — N186 End stage renal disease: Secondary | ICD-10-CM | POA: Diagnosis not present

## 2023-03-24 DIAGNOSIS — N2581 Secondary hyperparathyroidism of renal origin: Secondary | ICD-10-CM | POA: Diagnosis not present

## 2023-03-26 DIAGNOSIS — N2581 Secondary hyperparathyroidism of renal origin: Secondary | ICD-10-CM | POA: Diagnosis not present

## 2023-03-26 DIAGNOSIS — N186 End stage renal disease: Secondary | ICD-10-CM | POA: Diagnosis not present

## 2023-03-26 DIAGNOSIS — Z992 Dependence on renal dialysis: Secondary | ICD-10-CM | POA: Diagnosis not present

## 2023-03-26 DIAGNOSIS — R52 Pain, unspecified: Secondary | ICD-10-CM | POA: Diagnosis not present

## 2023-03-29 DIAGNOSIS — N186 End stage renal disease: Secondary | ICD-10-CM | POA: Diagnosis not present

## 2023-03-29 DIAGNOSIS — Z992 Dependence on renal dialysis: Secondary | ICD-10-CM | POA: Diagnosis not present

## 2023-03-29 DIAGNOSIS — N2581 Secondary hyperparathyroidism of renal origin: Secondary | ICD-10-CM | POA: Diagnosis not present

## 2023-03-31 DIAGNOSIS — N2581 Secondary hyperparathyroidism of renal origin: Secondary | ICD-10-CM | POA: Diagnosis not present

## 2023-03-31 DIAGNOSIS — Z992 Dependence on renal dialysis: Secondary | ICD-10-CM | POA: Diagnosis not present

## 2023-03-31 DIAGNOSIS — N186 End stage renal disease: Secondary | ICD-10-CM | POA: Diagnosis not present

## 2023-04-02 DIAGNOSIS — Z992 Dependence on renal dialysis: Secondary | ICD-10-CM | POA: Diagnosis not present

## 2023-04-02 DIAGNOSIS — N186 End stage renal disease: Secondary | ICD-10-CM | POA: Diagnosis not present

## 2023-04-02 DIAGNOSIS — N2581 Secondary hyperparathyroidism of renal origin: Secondary | ICD-10-CM | POA: Diagnosis not present

## 2023-04-07 DIAGNOSIS — N2581 Secondary hyperparathyroidism of renal origin: Secondary | ICD-10-CM | POA: Diagnosis not present

## 2023-04-07 DIAGNOSIS — N186 End stage renal disease: Secondary | ICD-10-CM | POA: Diagnosis not present

## 2023-04-07 DIAGNOSIS — R52 Pain, unspecified: Secondary | ICD-10-CM | POA: Diagnosis not present

## 2023-04-07 DIAGNOSIS — Z992 Dependence on renal dialysis: Secondary | ICD-10-CM | POA: Diagnosis not present

## 2023-04-09 DIAGNOSIS — R52 Pain, unspecified: Secondary | ICD-10-CM | POA: Diagnosis not present

## 2023-04-09 DIAGNOSIS — N186 End stage renal disease: Secondary | ICD-10-CM | POA: Diagnosis not present

## 2023-04-09 DIAGNOSIS — Z992 Dependence on renal dialysis: Secondary | ICD-10-CM | POA: Diagnosis not present

## 2023-04-09 DIAGNOSIS — N2581 Secondary hyperparathyroidism of renal origin: Secondary | ICD-10-CM | POA: Diagnosis not present

## 2023-04-11 DIAGNOSIS — Z992 Dependence on renal dialysis: Secondary | ICD-10-CM | POA: Diagnosis not present

## 2023-04-11 DIAGNOSIS — E1122 Type 2 diabetes mellitus with diabetic chronic kidney disease: Secondary | ICD-10-CM | POA: Diagnosis not present

## 2023-04-11 DIAGNOSIS — N186 End stage renal disease: Secondary | ICD-10-CM | POA: Diagnosis not present

## 2023-04-12 DIAGNOSIS — N186 End stage renal disease: Secondary | ICD-10-CM | POA: Diagnosis not present

## 2023-04-12 DIAGNOSIS — Z992 Dependence on renal dialysis: Secondary | ICD-10-CM | POA: Diagnosis not present

## 2023-04-12 DIAGNOSIS — N2581 Secondary hyperparathyroidism of renal origin: Secondary | ICD-10-CM | POA: Diagnosis not present

## 2023-04-14 DIAGNOSIS — N2581 Secondary hyperparathyroidism of renal origin: Secondary | ICD-10-CM | POA: Diagnosis not present

## 2023-04-14 DIAGNOSIS — Z992 Dependence on renal dialysis: Secondary | ICD-10-CM | POA: Diagnosis not present

## 2023-04-14 DIAGNOSIS — N186 End stage renal disease: Secondary | ICD-10-CM | POA: Diagnosis not present

## 2023-04-16 DIAGNOSIS — N186 End stage renal disease: Secondary | ICD-10-CM | POA: Diagnosis not present

## 2023-04-16 DIAGNOSIS — N2581 Secondary hyperparathyroidism of renal origin: Secondary | ICD-10-CM | POA: Diagnosis not present

## 2023-04-16 DIAGNOSIS — Z992 Dependence on renal dialysis: Secondary | ICD-10-CM | POA: Diagnosis not present

## 2023-04-19 DIAGNOSIS — N186 End stage renal disease: Secondary | ICD-10-CM | POA: Diagnosis not present

## 2023-04-19 DIAGNOSIS — R52 Pain, unspecified: Secondary | ICD-10-CM | POA: Diagnosis not present

## 2023-04-19 DIAGNOSIS — Z992 Dependence on renal dialysis: Secondary | ICD-10-CM | POA: Diagnosis not present

## 2023-04-19 DIAGNOSIS — N2581 Secondary hyperparathyroidism of renal origin: Secondary | ICD-10-CM | POA: Diagnosis not present

## 2023-04-20 DIAGNOSIS — K118 Other diseases of salivary glands: Secondary | ICD-10-CM | POA: Diagnosis not present

## 2023-04-20 DIAGNOSIS — I150 Renovascular hypertension: Secondary | ICD-10-CM | POA: Diagnosis not present

## 2023-04-20 DIAGNOSIS — L820 Inflamed seborrheic keratosis: Secondary | ICD-10-CM | POA: Diagnosis not present

## 2023-04-21 DIAGNOSIS — R52 Pain, unspecified: Secondary | ICD-10-CM | POA: Diagnosis not present

## 2023-04-21 DIAGNOSIS — Z992 Dependence on renal dialysis: Secondary | ICD-10-CM | POA: Diagnosis not present

## 2023-04-21 DIAGNOSIS — N2581 Secondary hyperparathyroidism of renal origin: Secondary | ICD-10-CM | POA: Diagnosis not present

## 2023-04-21 DIAGNOSIS — N186 End stage renal disease: Secondary | ICD-10-CM | POA: Diagnosis not present

## 2023-04-23 DIAGNOSIS — N2581 Secondary hyperparathyroidism of renal origin: Secondary | ICD-10-CM | POA: Diagnosis not present

## 2023-04-23 DIAGNOSIS — N186 End stage renal disease: Secondary | ICD-10-CM | POA: Diagnosis not present

## 2023-04-23 DIAGNOSIS — Z992 Dependence on renal dialysis: Secondary | ICD-10-CM | POA: Diagnosis not present

## 2023-04-23 DIAGNOSIS — R52 Pain, unspecified: Secondary | ICD-10-CM | POA: Diagnosis not present

## 2023-04-28 DIAGNOSIS — N186 End stage renal disease: Secondary | ICD-10-CM | POA: Diagnosis not present

## 2023-04-28 DIAGNOSIS — N2581 Secondary hyperparathyroidism of renal origin: Secondary | ICD-10-CM | POA: Diagnosis not present

## 2023-04-28 DIAGNOSIS — Z992 Dependence on renal dialysis: Secondary | ICD-10-CM | POA: Diagnosis not present

## 2023-04-30 DIAGNOSIS — Z992 Dependence on renal dialysis: Secondary | ICD-10-CM | POA: Diagnosis not present

## 2023-04-30 DIAGNOSIS — N2581 Secondary hyperparathyroidism of renal origin: Secondary | ICD-10-CM | POA: Diagnosis not present

## 2023-04-30 DIAGNOSIS — N186 End stage renal disease: Secondary | ICD-10-CM | POA: Diagnosis not present

## 2023-05-03 DIAGNOSIS — Z992 Dependence on renal dialysis: Secondary | ICD-10-CM | POA: Diagnosis not present

## 2023-05-03 DIAGNOSIS — N186 End stage renal disease: Secondary | ICD-10-CM | POA: Diagnosis not present

## 2023-05-03 DIAGNOSIS — R52 Pain, unspecified: Secondary | ICD-10-CM | POA: Diagnosis not present

## 2023-05-03 DIAGNOSIS — N2581 Secondary hyperparathyroidism of renal origin: Secondary | ICD-10-CM | POA: Diagnosis not present

## 2023-05-05 DIAGNOSIS — N186 End stage renal disease: Secondary | ICD-10-CM | POA: Diagnosis not present

## 2023-05-05 DIAGNOSIS — Z992 Dependence on renal dialysis: Secondary | ICD-10-CM | POA: Diagnosis not present

## 2023-05-05 DIAGNOSIS — R52 Pain, unspecified: Secondary | ICD-10-CM | POA: Diagnosis not present

## 2023-05-05 DIAGNOSIS — N2581 Secondary hyperparathyroidism of renal origin: Secondary | ICD-10-CM | POA: Diagnosis not present

## 2023-05-07 DIAGNOSIS — R52 Pain, unspecified: Secondary | ICD-10-CM | POA: Diagnosis not present

## 2023-05-07 DIAGNOSIS — N186 End stage renal disease: Secondary | ICD-10-CM | POA: Diagnosis not present

## 2023-05-07 DIAGNOSIS — N2581 Secondary hyperparathyroidism of renal origin: Secondary | ICD-10-CM | POA: Diagnosis not present

## 2023-05-07 DIAGNOSIS — Z992 Dependence on renal dialysis: Secondary | ICD-10-CM | POA: Diagnosis not present

## 2023-05-10 DIAGNOSIS — R52 Pain, unspecified: Secondary | ICD-10-CM | POA: Diagnosis not present

## 2023-05-10 DIAGNOSIS — N186 End stage renal disease: Secondary | ICD-10-CM | POA: Diagnosis not present

## 2023-05-10 DIAGNOSIS — Z992 Dependence on renal dialysis: Secondary | ICD-10-CM | POA: Diagnosis not present

## 2023-05-10 DIAGNOSIS — N2581 Secondary hyperparathyroidism of renal origin: Secondary | ICD-10-CM | POA: Diagnosis not present

## 2023-05-11 DIAGNOSIS — N186 End stage renal disease: Secondary | ICD-10-CM | POA: Diagnosis not present

## 2023-05-11 DIAGNOSIS — Z992 Dependence on renal dialysis: Secondary | ICD-10-CM | POA: Diagnosis not present

## 2023-05-11 DIAGNOSIS — E1122 Type 2 diabetes mellitus with diabetic chronic kidney disease: Secondary | ICD-10-CM | POA: Diagnosis not present

## 2023-05-12 DIAGNOSIS — Z992 Dependence on renal dialysis: Secondary | ICD-10-CM | POA: Diagnosis not present

## 2023-05-12 DIAGNOSIS — N2581 Secondary hyperparathyroidism of renal origin: Secondary | ICD-10-CM | POA: Diagnosis not present

## 2023-05-12 DIAGNOSIS — N186 End stage renal disease: Secondary | ICD-10-CM | POA: Diagnosis not present

## 2023-05-14 DIAGNOSIS — Z992 Dependence on renal dialysis: Secondary | ICD-10-CM | POA: Diagnosis not present

## 2023-05-14 DIAGNOSIS — N186 End stage renal disease: Secondary | ICD-10-CM | POA: Diagnosis not present

## 2023-05-14 DIAGNOSIS — N2581 Secondary hyperparathyroidism of renal origin: Secondary | ICD-10-CM | POA: Diagnosis not present

## 2023-05-17 DIAGNOSIS — N186 End stage renal disease: Secondary | ICD-10-CM | POA: Diagnosis not present

## 2023-05-17 DIAGNOSIS — Z992 Dependence on renal dialysis: Secondary | ICD-10-CM | POA: Diagnosis not present

## 2023-05-17 DIAGNOSIS — N2581 Secondary hyperparathyroidism of renal origin: Secondary | ICD-10-CM | POA: Diagnosis not present

## 2023-05-19 DIAGNOSIS — N2581 Secondary hyperparathyroidism of renal origin: Secondary | ICD-10-CM | POA: Diagnosis not present

## 2023-05-19 DIAGNOSIS — N186 End stage renal disease: Secondary | ICD-10-CM | POA: Diagnosis not present

## 2023-05-19 DIAGNOSIS — Z992 Dependence on renal dialysis: Secondary | ICD-10-CM | POA: Diagnosis not present

## 2023-05-21 DIAGNOSIS — N186 End stage renal disease: Secondary | ICD-10-CM | POA: Diagnosis not present

## 2023-05-21 DIAGNOSIS — N2581 Secondary hyperparathyroidism of renal origin: Secondary | ICD-10-CM | POA: Diagnosis not present

## 2023-05-21 DIAGNOSIS — Z992 Dependence on renal dialysis: Secondary | ICD-10-CM | POA: Diagnosis not present

## 2023-05-24 DIAGNOSIS — Z992 Dependence on renal dialysis: Secondary | ICD-10-CM | POA: Diagnosis not present

## 2023-05-24 DIAGNOSIS — N2581 Secondary hyperparathyroidism of renal origin: Secondary | ICD-10-CM | POA: Diagnosis not present

## 2023-05-24 DIAGNOSIS — N186 End stage renal disease: Secondary | ICD-10-CM | POA: Diagnosis not present

## 2023-05-27 DIAGNOSIS — R052 Subacute cough: Secondary | ICD-10-CM | POA: Diagnosis not present

## 2023-05-28 DIAGNOSIS — N186 End stage renal disease: Secondary | ICD-10-CM | POA: Diagnosis not present

## 2023-05-28 DIAGNOSIS — Z992 Dependence on renal dialysis: Secondary | ICD-10-CM | POA: Diagnosis not present

## 2023-05-28 DIAGNOSIS — N2581 Secondary hyperparathyroidism of renal origin: Secondary | ICD-10-CM | POA: Diagnosis not present

## 2023-05-31 DIAGNOSIS — N186 End stage renal disease: Secondary | ICD-10-CM | POA: Diagnosis not present

## 2023-05-31 DIAGNOSIS — N2581 Secondary hyperparathyroidism of renal origin: Secondary | ICD-10-CM | POA: Diagnosis not present

## 2023-05-31 DIAGNOSIS — Z992 Dependence on renal dialysis: Secondary | ICD-10-CM | POA: Diagnosis not present

## 2023-06-02 DIAGNOSIS — N2581 Secondary hyperparathyroidism of renal origin: Secondary | ICD-10-CM | POA: Diagnosis not present

## 2023-06-02 DIAGNOSIS — N186 End stage renal disease: Secondary | ICD-10-CM | POA: Diagnosis not present

## 2023-06-02 DIAGNOSIS — K118 Other diseases of salivary glands: Secondary | ICD-10-CM | POA: Diagnosis not present

## 2023-06-02 DIAGNOSIS — Z992 Dependence on renal dialysis: Secondary | ICD-10-CM | POA: Diagnosis not present

## 2023-06-03 DIAGNOSIS — K118 Other diseases of salivary glands: Secondary | ICD-10-CM | POA: Diagnosis not present

## 2023-06-04 DIAGNOSIS — N186 End stage renal disease: Secondary | ICD-10-CM | POA: Diagnosis not present

## 2023-06-04 DIAGNOSIS — N2581 Secondary hyperparathyroidism of renal origin: Secondary | ICD-10-CM | POA: Diagnosis not present

## 2023-06-04 DIAGNOSIS — Z992 Dependence on renal dialysis: Secondary | ICD-10-CM | POA: Diagnosis not present

## 2023-06-07 DIAGNOSIS — N2581 Secondary hyperparathyroidism of renal origin: Secondary | ICD-10-CM | POA: Diagnosis not present

## 2023-06-07 DIAGNOSIS — N186 End stage renal disease: Secondary | ICD-10-CM | POA: Diagnosis not present

## 2023-06-07 DIAGNOSIS — Z992 Dependence on renal dialysis: Secondary | ICD-10-CM | POA: Diagnosis not present

## 2023-06-09 DIAGNOSIS — Z992 Dependence on renal dialysis: Secondary | ICD-10-CM | POA: Diagnosis not present

## 2023-06-09 DIAGNOSIS — N186 End stage renal disease: Secondary | ICD-10-CM | POA: Diagnosis not present

## 2023-06-09 DIAGNOSIS — N2581 Secondary hyperparathyroidism of renal origin: Secondary | ICD-10-CM | POA: Diagnosis not present

## 2023-06-11 DIAGNOSIS — N2581 Secondary hyperparathyroidism of renal origin: Secondary | ICD-10-CM | POA: Diagnosis not present

## 2023-06-11 DIAGNOSIS — E1122 Type 2 diabetes mellitus with diabetic chronic kidney disease: Secondary | ICD-10-CM | POA: Diagnosis not present

## 2023-06-11 DIAGNOSIS — Z992 Dependence on renal dialysis: Secondary | ICD-10-CM | POA: Diagnosis not present

## 2023-06-11 DIAGNOSIS — N186 End stage renal disease: Secondary | ICD-10-CM | POA: Diagnosis not present

## 2023-06-14 DIAGNOSIS — N186 End stage renal disease: Secondary | ICD-10-CM | POA: Diagnosis not present

## 2023-06-14 DIAGNOSIS — Z992 Dependence on renal dialysis: Secondary | ICD-10-CM | POA: Diagnosis not present

## 2023-06-14 DIAGNOSIS — N2581 Secondary hyperparathyroidism of renal origin: Secondary | ICD-10-CM | POA: Diagnosis not present

## 2023-06-16 DIAGNOSIS — N186 End stage renal disease: Secondary | ICD-10-CM | POA: Diagnosis not present

## 2023-06-16 DIAGNOSIS — N2581 Secondary hyperparathyroidism of renal origin: Secondary | ICD-10-CM | POA: Diagnosis not present

## 2023-06-16 DIAGNOSIS — Z992 Dependence on renal dialysis: Secondary | ICD-10-CM | POA: Diagnosis not present

## 2023-06-18 DIAGNOSIS — Z992 Dependence on renal dialysis: Secondary | ICD-10-CM | POA: Diagnosis not present

## 2023-06-18 DIAGNOSIS — N186 End stage renal disease: Secondary | ICD-10-CM | POA: Diagnosis not present

## 2023-06-18 DIAGNOSIS — N2581 Secondary hyperparathyroidism of renal origin: Secondary | ICD-10-CM | POA: Diagnosis not present

## 2023-06-23 DIAGNOSIS — N186 End stage renal disease: Secondary | ICD-10-CM | POA: Diagnosis not present

## 2023-06-23 DIAGNOSIS — Z992 Dependence on renal dialysis: Secondary | ICD-10-CM | POA: Diagnosis not present

## 2023-06-23 DIAGNOSIS — N2581 Secondary hyperparathyroidism of renal origin: Secondary | ICD-10-CM | POA: Diagnosis not present

## 2023-06-24 DIAGNOSIS — D37030 Neoplasm of uncertain behavior of the parotid salivary glands: Secondary | ICD-10-CM | POA: Diagnosis not present

## 2023-06-24 DIAGNOSIS — K118 Other diseases of salivary glands: Secondary | ICD-10-CM | POA: Diagnosis not present

## 2023-06-25 DIAGNOSIS — Z992 Dependence on renal dialysis: Secondary | ICD-10-CM | POA: Diagnosis not present

## 2023-06-25 DIAGNOSIS — N186 End stage renal disease: Secondary | ICD-10-CM | POA: Diagnosis not present

## 2023-06-25 DIAGNOSIS — N2581 Secondary hyperparathyroidism of renal origin: Secondary | ICD-10-CM | POA: Diagnosis not present

## 2023-06-28 DIAGNOSIS — Z992 Dependence on renal dialysis: Secondary | ICD-10-CM | POA: Diagnosis not present

## 2023-06-28 DIAGNOSIS — N2581 Secondary hyperparathyroidism of renal origin: Secondary | ICD-10-CM | POA: Diagnosis not present

## 2023-06-28 DIAGNOSIS — N186 End stage renal disease: Secondary | ICD-10-CM | POA: Diagnosis not present

## 2023-06-30 DIAGNOSIS — N186 End stage renal disease: Secondary | ICD-10-CM | POA: Diagnosis not present

## 2023-06-30 DIAGNOSIS — N2581 Secondary hyperparathyroidism of renal origin: Secondary | ICD-10-CM | POA: Diagnosis not present

## 2023-06-30 DIAGNOSIS — Z992 Dependence on renal dialysis: Secondary | ICD-10-CM | POA: Diagnosis not present

## 2023-07-02 DIAGNOSIS — Z992 Dependence on renal dialysis: Secondary | ICD-10-CM | POA: Diagnosis not present

## 2023-07-02 DIAGNOSIS — N2581 Secondary hyperparathyroidism of renal origin: Secondary | ICD-10-CM | POA: Diagnosis not present

## 2023-07-02 DIAGNOSIS — N186 End stage renal disease: Secondary | ICD-10-CM | POA: Diagnosis not present

## 2023-07-05 DIAGNOSIS — N2581 Secondary hyperparathyroidism of renal origin: Secondary | ICD-10-CM | POA: Diagnosis not present

## 2023-07-05 DIAGNOSIS — N186 End stage renal disease: Secondary | ICD-10-CM | POA: Diagnosis not present

## 2023-07-05 DIAGNOSIS — Z992 Dependence on renal dialysis: Secondary | ICD-10-CM | POA: Diagnosis not present

## 2023-07-07 DIAGNOSIS — K118 Other diseases of salivary glands: Secondary | ICD-10-CM | POA: Diagnosis not present

## 2023-07-07 DIAGNOSIS — N2581 Secondary hyperparathyroidism of renal origin: Secondary | ICD-10-CM | POA: Diagnosis not present

## 2023-07-07 DIAGNOSIS — Z992 Dependence on renal dialysis: Secondary | ICD-10-CM | POA: Diagnosis not present

## 2023-07-07 DIAGNOSIS — N186 End stage renal disease: Secondary | ICD-10-CM | POA: Diagnosis not present

## 2023-07-09 DIAGNOSIS — N186 End stage renal disease: Secondary | ICD-10-CM | POA: Diagnosis not present

## 2023-07-09 DIAGNOSIS — N2581 Secondary hyperparathyroidism of renal origin: Secondary | ICD-10-CM | POA: Diagnosis not present

## 2023-07-09 DIAGNOSIS — Z992 Dependence on renal dialysis: Secondary | ICD-10-CM | POA: Diagnosis not present

## 2023-07-11 DIAGNOSIS — Z992 Dependence on renal dialysis: Secondary | ICD-10-CM | POA: Diagnosis not present

## 2023-07-11 DIAGNOSIS — N186 End stage renal disease: Secondary | ICD-10-CM | POA: Diagnosis not present

## 2023-07-11 DIAGNOSIS — E1122 Type 2 diabetes mellitus with diabetic chronic kidney disease: Secondary | ICD-10-CM | POA: Diagnosis not present

## 2023-07-12 DIAGNOSIS — N2581 Secondary hyperparathyroidism of renal origin: Secondary | ICD-10-CM | POA: Diagnosis not present

## 2023-07-12 DIAGNOSIS — N186 End stage renal disease: Secondary | ICD-10-CM | POA: Diagnosis not present

## 2023-07-12 DIAGNOSIS — Z992 Dependence on renal dialysis: Secondary | ICD-10-CM | POA: Diagnosis not present

## 2023-07-14 DIAGNOSIS — N2581 Secondary hyperparathyroidism of renal origin: Secondary | ICD-10-CM | POA: Diagnosis not present

## 2023-07-14 DIAGNOSIS — N186 End stage renal disease: Secondary | ICD-10-CM | POA: Diagnosis not present

## 2023-07-14 DIAGNOSIS — Z992 Dependence on renal dialysis: Secondary | ICD-10-CM | POA: Diagnosis not present

## 2023-07-15 DIAGNOSIS — Z992 Dependence on renal dialysis: Secondary | ICD-10-CM | POA: Diagnosis not present

## 2023-07-15 DIAGNOSIS — N186 End stage renal disease: Secondary | ICD-10-CM | POA: Diagnosis not present

## 2023-07-15 DIAGNOSIS — N2581 Secondary hyperparathyroidism of renal origin: Secondary | ICD-10-CM | POA: Diagnosis not present

## 2023-07-19 DIAGNOSIS — N2581 Secondary hyperparathyroidism of renal origin: Secondary | ICD-10-CM | POA: Diagnosis not present

## 2023-07-19 DIAGNOSIS — Z992 Dependence on renal dialysis: Secondary | ICD-10-CM | POA: Diagnosis not present

## 2023-07-19 DIAGNOSIS — N186 End stage renal disease: Secondary | ICD-10-CM | POA: Diagnosis not present

## 2023-07-21 DIAGNOSIS — N2581 Secondary hyperparathyroidism of renal origin: Secondary | ICD-10-CM | POA: Diagnosis not present

## 2023-07-21 DIAGNOSIS — N186 End stage renal disease: Secondary | ICD-10-CM | POA: Diagnosis not present

## 2023-07-21 DIAGNOSIS — Z992 Dependence on renal dialysis: Secondary | ICD-10-CM | POA: Diagnosis not present

## 2023-07-23 DIAGNOSIS — N2581 Secondary hyperparathyroidism of renal origin: Secondary | ICD-10-CM | POA: Diagnosis not present

## 2023-07-23 DIAGNOSIS — N186 End stage renal disease: Secondary | ICD-10-CM | POA: Diagnosis not present

## 2023-07-23 DIAGNOSIS — Z992 Dependence on renal dialysis: Secondary | ICD-10-CM | POA: Diagnosis not present

## 2023-07-28 DIAGNOSIS — N2581 Secondary hyperparathyroidism of renal origin: Secondary | ICD-10-CM | POA: Diagnosis not present

## 2023-07-28 DIAGNOSIS — N186 End stage renal disease: Secondary | ICD-10-CM | POA: Diagnosis not present

## 2023-07-28 DIAGNOSIS — Z992 Dependence on renal dialysis: Secondary | ICD-10-CM | POA: Diagnosis not present

## 2023-07-28 DIAGNOSIS — Z23 Encounter for immunization: Secondary | ICD-10-CM | POA: Diagnosis not present

## 2023-07-30 DIAGNOSIS — N186 End stage renal disease: Secondary | ICD-10-CM | POA: Diagnosis not present

## 2023-07-30 DIAGNOSIS — N2581 Secondary hyperparathyroidism of renal origin: Secondary | ICD-10-CM | POA: Diagnosis not present

## 2023-07-30 DIAGNOSIS — Z992 Dependence on renal dialysis: Secondary | ICD-10-CM | POA: Diagnosis not present

## 2023-07-30 DIAGNOSIS — Z23 Encounter for immunization: Secondary | ICD-10-CM | POA: Diagnosis not present

## 2023-08-02 DIAGNOSIS — N2581 Secondary hyperparathyroidism of renal origin: Secondary | ICD-10-CM | POA: Diagnosis not present

## 2023-08-02 DIAGNOSIS — N186 End stage renal disease: Secondary | ICD-10-CM | POA: Diagnosis not present

## 2023-08-02 DIAGNOSIS — Z992 Dependence on renal dialysis: Secondary | ICD-10-CM | POA: Diagnosis not present

## 2023-08-04 DIAGNOSIS — Z992 Dependence on renal dialysis: Secondary | ICD-10-CM | POA: Diagnosis not present

## 2023-08-04 DIAGNOSIS — N2581 Secondary hyperparathyroidism of renal origin: Secondary | ICD-10-CM | POA: Diagnosis not present

## 2023-08-04 DIAGNOSIS — N186 End stage renal disease: Secondary | ICD-10-CM | POA: Diagnosis not present

## 2023-08-06 DIAGNOSIS — N2581 Secondary hyperparathyroidism of renal origin: Secondary | ICD-10-CM | POA: Diagnosis not present

## 2023-08-06 DIAGNOSIS — Z992 Dependence on renal dialysis: Secondary | ICD-10-CM | POA: Diagnosis not present

## 2023-08-06 DIAGNOSIS — N186 End stage renal disease: Secondary | ICD-10-CM | POA: Diagnosis not present

## 2023-08-09 DIAGNOSIS — Z992 Dependence on renal dialysis: Secondary | ICD-10-CM | POA: Diagnosis not present

## 2023-08-09 DIAGNOSIS — N186 End stage renal disease: Secondary | ICD-10-CM | POA: Diagnosis not present

## 2023-08-09 DIAGNOSIS — N2581 Secondary hyperparathyroidism of renal origin: Secondary | ICD-10-CM | POA: Diagnosis not present

## 2023-08-11 DIAGNOSIS — N186 End stage renal disease: Secondary | ICD-10-CM | POA: Diagnosis not present

## 2023-08-11 DIAGNOSIS — N2581 Secondary hyperparathyroidism of renal origin: Secondary | ICD-10-CM | POA: Diagnosis not present

## 2023-08-11 DIAGNOSIS — Z992 Dependence on renal dialysis: Secondary | ICD-10-CM | POA: Diagnosis not present

## 2023-08-11 DIAGNOSIS — E1122 Type 2 diabetes mellitus with diabetic chronic kidney disease: Secondary | ICD-10-CM | POA: Diagnosis not present

## 2023-08-12 ENCOUNTER — Other Ambulatory Visit: Payer: Self-pay | Admitting: Otolaryngology

## 2023-08-12 NOTE — Pre-Procedure Instructions (Addendum)
 Surgical Instructions   Your procedure is scheduled on August 19, 2023. Report to [facility] Main Entrance A at 10:00 A.M., then check in with the Admitting office. Any questions or running late day of surgery: call [phone]  Questions prior to your surgery date: call [phone], Monday-Friday, 8am-4pm. If you experience any cold or flu symptoms such as cough, fever, chills, shortness of breath, etc. between now and your scheduled surgery, please notify us  at the above number.     Remember:  Do not eat after midnight the night before your surgery   You may drink clear liquids until 9:00 AM the morning of your surgery.   Clear liquids allowed are: Water, Non-Citrus Juices (without pulp), Carbonated Beverages, Clear Tea (no milk, honey, etc.), Black Coffee Only (NO MILK, CREAM OR POWDERED CREAMER of any kind), and Gatorade.    Take these medicines the morning of surgery with A SIP OF WATER: amLODipine  (NORVASC )  carvedilol  (COREG )  hydrALAZINE  (APRESOLINE )    May take these medicines IF NEEDED: cyclobenzaprine  (FLEXERIL )    One week prior to surgery, STOP taking any Aspirin  (unless otherwise instructed by your surgeon) Aleve, Naproxen, Ibuprofen , Motrin , Advil , Goody's, BC's, all herbal medications, fish oil, and non-prescription vitamins.   WHAT DO I DO ABOUT MY DIABETES MEDICATION?   Do not take oral diabetes medicines (pills) the morning of surgery.  THE NIGHT BEFORE SURGERY, take ___________ units of ___________insulin.       THE MORNING OF SURGERY, take _____________ units of __________insulin.  The day of surgery, do not take other diabetes injectables, including Byetta (exenatide), Bydureon (exenatide ER), Victoza (liraglutide), or Trulicity (dulaglutide).  If your CBG is greater than 220 mg/dL, you may take  of your sliding scale (correction) dose of insulin .   HOW TO MANAGE YOUR DIABETES BEFORE AND AFTER SURGERY  Why is it important to control my blood  sugar before and after surgery? Improving blood sugar levels before and after surgery helps healing and can limit problems. A way of improving blood sugar control is eating a healthy diet by:  Eating less sugar and carbohydrates  Increasing activity/exercise  Talking with your doctor about reaching your blood sugar goals High blood sugars (greater than 180 mg/dL) can raise your risk of infections and slow your recovery, so you will need to focus on controlling your diabetes during the weeks before surgery. Make sure that the doctor who takes care of your diabetes knows about your planned surgery including the date and location.  How do I manage my blood sugar before surgery? Check your blood sugar at least 4 times a day, starting 2 days before surgery, to make sure that the level is not too high or low.  Check your blood sugar the morning of your surgery when you wake up and every 2 hours until you get to the Short Stay unit.  If your blood sugar is less than 70 mg/dL, you will need to treat for low blood sugar: Do not take insulin . Treat a low blood sugar (less than 70 mg/dL) with  cup of clear juice (cranberry or apple), 4 glucose tablets, OR glucose gel. Recheck blood sugar in 15 minutes after treatment (to make sure it is greater than 70 mg/dL). If your blood sugar is not greater than 70 mg/dL on recheck, call [phone] for further instructions. Report your blood sugar to the short stay nurse when you get to Short Stay.  If you are admitted to the hospital after surgery: Your blood sugar  will be checked by the staff and you will probably be given insulin  after surgery (instead of oral diabetes medicines) to make sure you have good blood sugar levels. The goal for blood sugar control after surgery is 80-180 mg/dL.                      Do NOT Smoke (Tobacco/Vaping) for 24 hours prior to your procedure.  If you use a CPAP at night, you may bring your mask/headgear for your overnight  stay.   You will be asked to remove any contacts, glasses, piercing's, hearing aid's, dentures/partials prior to surgery. Please bring cases for these items if needed.    Patients discharged the day of surgery will not be allowed to drive home, and someone needs to stay with them for 24 hours.  SURGICAL WAITING ROOM VISITATION Patients may have no more than 2 support people in the waiting area - these visitors may rotate.   Pre-op nurse will coordinate an appropriate time for 1 ADULT support person, who may not rotate, to accompany patient in pre-op.  Children under the age of 33 must have an adult with them who is not the patient and must remain in the main waiting area with an adult.  If the patient needs to stay at the hospital during part of their recovery, the visitor guidelines for inpatient rooms apply.  Please refer to the [facility] website for the visitor guidelines for any additional information.   If you received a COVID test during your pre-op visit  it is requested that you wear a mask when out in public, stay away from anyone that may not be feeling well and notify your surgeon if you develop symptoms. If you have been in contact with anyone that has tested positive in the last 10 days please notify you surgeon.      Pre-operative CHG Bathing Instructions   You can play a key role in reducing the risk of infection after surgery. Your skin needs to be as free of germs as possible. You can reduce the number of germs on your skin by washing with CHG (chlorhexidine  gluconate) soap before surgery. CHG is an antiseptic soap that kills germs and continues to kill germs even after washing.   DO NOT use if you have an allergy to chlorhexidine /CHG or antibacterial soaps. If your skin becomes reddened or irritated, stop using the CHG and notify one of our RNs at [phone].              TAKE A SHOWER THE NIGHT BEFORE SURGERY AND THE DAY OF SURGERY    Please keep in mind the  following:  DO NOT shave, including legs and underarms, 48 hours prior to surgery.   You may shave your face before/day of surgery.  Place clean sheets on your bed the night before surgery Use a clean washcloth (not used since being washed) for each shower. DO NOT sleep with pet's night before surgery.  CHG Shower Instructions:  Wash your face and private area with normal soap. If you choose to wash your hair, wash first with your normal shampoo.  After you use shampoo/soap, rinse your hair and body thoroughly to remove shampoo/soap residue.  Turn the water OFF and apply half the bottle of CHG soap to a CLEAN washcloth.  Apply CHG soap ONLY FROM YOUR NECK DOWN TO YOUR TOES (washing for 3-5 minutes)  DO NOT use CHG soap on face, private areas, open wounds,  or sores.  Pay special attention to the area where your surgery is being performed.  If you are having back surgery, having someone wash your back for you may be helpful. Wait 2 minutes after CHG soap is applied, then you may rinse off the CHG soap.  Pat dry with a clean towel  Put on clean pajamas    Additional instructions for the day of surgery: DO NOT APPLY any lotions, deodorants, cologne, or perfumes.   Do not wear jewelry or makeup Do not wear nail polish, gel polish, artificial nails, or any other type of covering on natural nails (fingers and toes) Do not bring valuables to the hospital. [facility] is not responsible for valuables/personal belongings. Put on clean/comfortable clothes.  Please brush your teeth.  Ask your nurse before applying any prescription medications to the skin.

## 2023-08-13 DIAGNOSIS — N186 End stage renal disease: Secondary | ICD-10-CM | POA: Diagnosis not present

## 2023-08-13 DIAGNOSIS — Z992 Dependence on renal dialysis: Secondary | ICD-10-CM | POA: Diagnosis not present

## 2023-08-13 DIAGNOSIS — N2581 Secondary hyperparathyroidism of renal origin: Secondary | ICD-10-CM | POA: Diagnosis not present

## 2023-08-15 ENCOUNTER — Inpatient Hospital Stay (HOSPITAL_COMMUNITY)
Admission: RE | Admit: 2023-08-15 | Discharge: 2023-08-15 | Disposition: A | Discharge status: Home / Self Care | Source: Ambulatory Visit

## 2023-08-18 DIAGNOSIS — N186 End stage renal disease: Secondary | ICD-10-CM | POA: Diagnosis not present

## 2023-08-18 DIAGNOSIS — N2581 Secondary hyperparathyroidism of renal origin: Secondary | ICD-10-CM | POA: Diagnosis not present

## 2023-08-18 DIAGNOSIS — Z992 Dependence on renal dialysis: Secondary | ICD-10-CM | POA: Diagnosis not present

## 2023-08-19 ENCOUNTER — Encounter (HOSPITAL_COMMUNITY): Admission: RE | Payer: Self-pay | Source: Home / Self Care

## 2023-08-19 ENCOUNTER — Ambulatory Visit (HOSPITAL_COMMUNITY): Admission: RE | Admit: 2023-08-19 | Source: Home / Self Care | Admitting: Otolaryngology

## 2023-08-19 SURGERY — EXCISION, PAROTID GLAND
Anesthesia: General | Laterality: Left

## 2023-08-20 DIAGNOSIS — Z992 Dependence on renal dialysis: Secondary | ICD-10-CM | POA: Diagnosis not present

## 2023-08-20 DIAGNOSIS — N186 End stage renal disease: Secondary | ICD-10-CM | POA: Diagnosis not present

## 2023-08-20 DIAGNOSIS — N2581 Secondary hyperparathyroidism of renal origin: Secondary | ICD-10-CM | POA: Diagnosis not present

## 2023-08-23 DIAGNOSIS — N186 End stage renal disease: Secondary | ICD-10-CM | POA: Diagnosis not present

## 2023-08-23 DIAGNOSIS — N2581 Secondary hyperparathyroidism of renal origin: Secondary | ICD-10-CM | POA: Diagnosis not present

## 2023-08-23 DIAGNOSIS — Z992 Dependence on renal dialysis: Secondary | ICD-10-CM | POA: Diagnosis not present

## 2023-08-25 DIAGNOSIS — N186 End stage renal disease: Secondary | ICD-10-CM | POA: Diagnosis not present

## 2023-08-25 DIAGNOSIS — N2581 Secondary hyperparathyroidism of renal origin: Secondary | ICD-10-CM | POA: Diagnosis not present

## 2023-08-25 DIAGNOSIS — Z992 Dependence on renal dialysis: Secondary | ICD-10-CM | POA: Diagnosis not present

## 2023-08-27 DIAGNOSIS — N186 End stage renal disease: Secondary | ICD-10-CM | POA: Diagnosis not present

## 2023-08-27 DIAGNOSIS — N2581 Secondary hyperparathyroidism of renal origin: Secondary | ICD-10-CM | POA: Diagnosis not present

## 2023-08-27 DIAGNOSIS — Z992 Dependence on renal dialysis: Secondary | ICD-10-CM | POA: Diagnosis not present

## 2023-08-30 DIAGNOSIS — N2581 Secondary hyperparathyroidism of renal origin: Secondary | ICD-10-CM | POA: Diagnosis not present

## 2023-08-30 DIAGNOSIS — Z992 Dependence on renal dialysis: Secondary | ICD-10-CM | POA: Diagnosis not present

## 2023-08-30 DIAGNOSIS — N186 End stage renal disease: Secondary | ICD-10-CM | POA: Diagnosis not present

## 2023-09-01 DIAGNOSIS — Z992 Dependence on renal dialysis: Secondary | ICD-10-CM | POA: Diagnosis not present

## 2023-09-01 DIAGNOSIS — N2581 Secondary hyperparathyroidism of renal origin: Secondary | ICD-10-CM | POA: Diagnosis not present

## 2023-09-01 DIAGNOSIS — N186 End stage renal disease: Secondary | ICD-10-CM | POA: Diagnosis not present

## 2023-09-03 DIAGNOSIS — Z992 Dependence on renal dialysis: Secondary | ICD-10-CM | POA: Diagnosis not present

## 2023-09-03 DIAGNOSIS — N186 End stage renal disease: Secondary | ICD-10-CM | POA: Diagnosis not present

## 2023-09-03 DIAGNOSIS — N2581 Secondary hyperparathyroidism of renal origin: Secondary | ICD-10-CM | POA: Diagnosis not present

## 2023-09-06 DIAGNOSIS — N2581 Secondary hyperparathyroidism of renal origin: Secondary | ICD-10-CM | POA: Diagnosis not present

## 2023-09-06 DIAGNOSIS — N186 End stage renal disease: Secondary | ICD-10-CM | POA: Diagnosis not present

## 2023-09-06 DIAGNOSIS — Z992 Dependence on renal dialysis: Secondary | ICD-10-CM | POA: Diagnosis not present

## 2023-09-10 DIAGNOSIS — Z992 Dependence on renal dialysis: Secondary | ICD-10-CM | POA: Diagnosis not present

## 2023-09-10 DIAGNOSIS — N2581 Secondary hyperparathyroidism of renal origin: Secondary | ICD-10-CM | POA: Diagnosis not present

## 2023-09-10 DIAGNOSIS — N186 End stage renal disease: Secondary | ICD-10-CM | POA: Diagnosis not present

## 2023-09-11 DIAGNOSIS — N186 End stage renal disease: Secondary | ICD-10-CM | POA: Diagnosis not present

## 2023-09-11 DIAGNOSIS — Z992 Dependence on renal dialysis: Secondary | ICD-10-CM | POA: Diagnosis not present

## 2023-09-11 DIAGNOSIS — E1122 Type 2 diabetes mellitus with diabetic chronic kidney disease: Secondary | ICD-10-CM | POA: Diagnosis not present

## 2023-09-13 DIAGNOSIS — N2581 Secondary hyperparathyroidism of renal origin: Secondary | ICD-10-CM | POA: Diagnosis not present

## 2023-09-13 DIAGNOSIS — Z992 Dependence on renal dialysis: Secondary | ICD-10-CM | POA: Diagnosis not present

## 2023-09-13 DIAGNOSIS — N186 End stage renal disease: Secondary | ICD-10-CM | POA: Diagnosis not present

## 2023-09-15 DIAGNOSIS — Z992 Dependence on renal dialysis: Secondary | ICD-10-CM | POA: Diagnosis not present

## 2023-09-15 DIAGNOSIS — N2581 Secondary hyperparathyroidism of renal origin: Secondary | ICD-10-CM | POA: Diagnosis not present

## 2023-09-15 DIAGNOSIS — N186 End stage renal disease: Secondary | ICD-10-CM | POA: Diagnosis not present

## 2023-09-17 DIAGNOSIS — Z992 Dependence on renal dialysis: Secondary | ICD-10-CM | POA: Diagnosis not present

## 2023-09-17 DIAGNOSIS — N2581 Secondary hyperparathyroidism of renal origin: Secondary | ICD-10-CM | POA: Diagnosis not present

## 2023-09-17 DIAGNOSIS — N186 End stage renal disease: Secondary | ICD-10-CM | POA: Diagnosis not present

## 2023-09-20 DIAGNOSIS — R52 Pain, unspecified: Secondary | ICD-10-CM | POA: Diagnosis not present

## 2023-09-20 DIAGNOSIS — N2581 Secondary hyperparathyroidism of renal origin: Secondary | ICD-10-CM | POA: Diagnosis not present

## 2023-09-20 DIAGNOSIS — N186 End stage renal disease: Secondary | ICD-10-CM | POA: Diagnosis not present

## 2023-09-20 DIAGNOSIS — Z992 Dependence on renal dialysis: Secondary | ICD-10-CM | POA: Diagnosis not present

## 2023-09-22 DIAGNOSIS — R52 Pain, unspecified: Secondary | ICD-10-CM | POA: Diagnosis not present

## 2023-09-22 DIAGNOSIS — N186 End stage renal disease: Secondary | ICD-10-CM | POA: Diagnosis not present

## 2023-09-22 DIAGNOSIS — N2581 Secondary hyperparathyroidism of renal origin: Secondary | ICD-10-CM | POA: Diagnosis not present

## 2023-09-22 DIAGNOSIS — Z992 Dependence on renal dialysis: Secondary | ICD-10-CM | POA: Diagnosis not present

## 2023-09-24 DIAGNOSIS — N2581 Secondary hyperparathyroidism of renal origin: Secondary | ICD-10-CM | POA: Diagnosis not present

## 2023-09-24 DIAGNOSIS — Z992 Dependence on renal dialysis: Secondary | ICD-10-CM | POA: Diagnosis not present

## 2023-09-24 DIAGNOSIS — R52 Pain, unspecified: Secondary | ICD-10-CM | POA: Diagnosis not present

## 2023-09-24 DIAGNOSIS — N186 End stage renal disease: Secondary | ICD-10-CM | POA: Diagnosis not present

## 2023-09-27 DIAGNOSIS — Z992 Dependence on renal dialysis: Secondary | ICD-10-CM | POA: Diagnosis not present

## 2023-09-27 DIAGNOSIS — N2581 Secondary hyperparathyroidism of renal origin: Secondary | ICD-10-CM | POA: Diagnosis not present

## 2023-09-27 DIAGNOSIS — N186 End stage renal disease: Secondary | ICD-10-CM | POA: Diagnosis not present

## 2023-09-29 DIAGNOSIS — N2581 Secondary hyperparathyroidism of renal origin: Secondary | ICD-10-CM | POA: Diagnosis not present

## 2023-09-29 DIAGNOSIS — Z992 Dependence on renal dialysis: Secondary | ICD-10-CM | POA: Diagnosis not present

## 2023-09-29 DIAGNOSIS — N186 End stage renal disease: Secondary | ICD-10-CM | POA: Diagnosis not present

## 2023-10-01 DIAGNOSIS — N186 End stage renal disease: Secondary | ICD-10-CM | POA: Diagnosis not present

## 2023-10-01 DIAGNOSIS — N2581 Secondary hyperparathyroidism of renal origin: Secondary | ICD-10-CM | POA: Diagnosis not present

## 2023-10-01 DIAGNOSIS — Z992 Dependence on renal dialysis: Secondary | ICD-10-CM | POA: Diagnosis not present

## 2023-10-04 DIAGNOSIS — R52 Pain, unspecified: Secondary | ICD-10-CM | POA: Diagnosis not present

## 2023-10-04 DIAGNOSIS — N186 End stage renal disease: Secondary | ICD-10-CM | POA: Diagnosis not present

## 2023-10-04 DIAGNOSIS — N2581 Secondary hyperparathyroidism of renal origin: Secondary | ICD-10-CM | POA: Diagnosis not present

## 2023-10-04 DIAGNOSIS — Z992 Dependence on renal dialysis: Secondary | ICD-10-CM | POA: Diagnosis not present

## 2023-10-06 DIAGNOSIS — N186 End stage renal disease: Secondary | ICD-10-CM | POA: Diagnosis not present

## 2023-10-06 DIAGNOSIS — R52 Pain, unspecified: Secondary | ICD-10-CM | POA: Diagnosis not present

## 2023-10-06 DIAGNOSIS — N2581 Secondary hyperparathyroidism of renal origin: Secondary | ICD-10-CM | POA: Diagnosis not present

## 2023-10-06 DIAGNOSIS — Z992 Dependence on renal dialysis: Secondary | ICD-10-CM | POA: Diagnosis not present

## 2023-10-08 DIAGNOSIS — N186 End stage renal disease: Secondary | ICD-10-CM | POA: Diagnosis not present

## 2023-10-08 DIAGNOSIS — R52 Pain, unspecified: Secondary | ICD-10-CM | POA: Diagnosis not present

## 2023-10-08 DIAGNOSIS — N2581 Secondary hyperparathyroidism of renal origin: Secondary | ICD-10-CM | POA: Diagnosis not present

## 2023-10-08 DIAGNOSIS — Z992 Dependence on renal dialysis: Secondary | ICD-10-CM | POA: Diagnosis not present

## 2023-10-11 DIAGNOSIS — N2581 Secondary hyperparathyroidism of renal origin: Secondary | ICD-10-CM | POA: Diagnosis not present

## 2023-10-11 DIAGNOSIS — E1122 Type 2 diabetes mellitus with diabetic chronic kidney disease: Secondary | ICD-10-CM | POA: Diagnosis not present

## 2023-10-11 DIAGNOSIS — Z992 Dependence on renal dialysis: Secondary | ICD-10-CM | POA: Diagnosis not present

## 2023-10-11 DIAGNOSIS — N186 End stage renal disease: Secondary | ICD-10-CM | POA: Diagnosis not present

## 2023-10-13 DIAGNOSIS — N2581 Secondary hyperparathyroidism of renal origin: Secondary | ICD-10-CM | POA: Diagnosis not present

## 2023-10-13 DIAGNOSIS — N186 End stage renal disease: Secondary | ICD-10-CM | POA: Diagnosis not present

## 2023-10-13 DIAGNOSIS — Z992 Dependence on renal dialysis: Secondary | ICD-10-CM | POA: Diagnosis not present

## 2023-10-17 ENCOUNTER — Encounter: Payer: Self-pay | Admitting: Family

## 2023-10-17 ENCOUNTER — Encounter: Payer: Self-pay | Admitting: Family Medicine

## 2023-10-17 ENCOUNTER — Ambulatory Visit (INDEPENDENT_AMBULATORY_CARE_PROVIDER_SITE_OTHER): Admitting: Family Medicine

## 2023-10-17 VITALS — BP 194/77 | HR 67 | Temp 97.5°F | Resp 16 | Ht 64.0 in | Wt 194.6 lb

## 2023-10-17 DIAGNOSIS — R110 Nausea: Secondary | ICD-10-CM

## 2023-10-17 DIAGNOSIS — I10 Essential (primary) hypertension: Secondary | ICD-10-CM | POA: Diagnosis not present

## 2023-10-17 DIAGNOSIS — F17200 Nicotine dependence, unspecified, uncomplicated: Secondary | ICD-10-CM | POA: Diagnosis not present

## 2023-10-17 DIAGNOSIS — R053 Chronic cough: Secondary | ICD-10-CM | POA: Diagnosis not present

## 2023-10-17 MED ORDER — FLUTICASONE PROPIONATE 50 MCG/ACT NA SUSP: 2.0000 | Freq: Every day | NASAL | 1 refills | Status: DC

## 2023-10-17 MED ORDER — ONDANSETRON 4 MG PO TBDP: 4.0000 mg | ORAL_TABLET | Freq: Three times a day (TID) | ORAL | 0 refills | Status: DC | PRN

## 2023-10-17 NOTE — Progress Notes (Signed)
 Established Patient Office Visit  Subjective    Patient ID: [name], female    DOB: March 05, 1960  Age: 63 y.o. MRN: [id number]  CC:  Chief Complaint  Patient presents with   Medical Management of Chronic Issues    Patient c/o NVD x2weeks. Pt also says she has had stuffy nose, cough, headache since spring     HPI [name] presents with complaint of nausea. Patient has been taking Pepto for sx. Sx for about 2 weeks but only occurs once a week. Pepto has not helped. Patient is a dialysis patient.   Outpatient Encounter Medications as of 10/17/2023  Medication Sig   amLODipine  (NORVASC ) 10 MG tablet Take 1 tablet (10 mg total) by mouth daily.   carvedilol  (COREG ) 25 MG tablet Take 25 mg by mouth 2 (two) times daily.   cyclobenzaprine  (FLEXERIL ) 5 MG tablet Take 1 tablet (5 mg total) by mouth 3 (three) times daily as needed for muscle spasms.   hydrALAZINE  (APRESOLINE ) 100 MG tablet Take 100 mg by mouth 2 (two) times daily.   HYDROcodone -acetaminophen  (NORCO/VICODIN) 5-325 MG tablet Take 1 tablet by mouth every 4 (four) hours as needed.   sevelamer  carbonate (RENVELA ) 800 MG tablet Take 1,600 mg by mouth 3 (three) times daily with meals.   No facility-administered encounter medications on file as of 10/17/2023.    Past Medical History:  Diagnosis Date   Angina    CAD (coronary artery disease), non obstructive CAD in 2010    a. 2013 Cath: LAD 60, RCA 54m, 70d; b. 2014 Cath: stable anatomy.   Chronic kidney disease    Stage 5; polycystic kidney disease , elevated creatine   COPD (chronic obstructive pulmonary disease) (HCC)    Diabetes mellitus without complication (HCC)    a. 12/2014 HbA1c 6.5; b. Rx Glipizide  - not taking.   Dysrhythmia    Family history of premature CAD    GERD (gastroesophageal reflux disease)    Hx of adenomatous colonic polyps 06/11/2016   Hyperlipidemia    Hypertension    Hypertensive heart disease    Palpitations    Reflux    Sleep  apnea    mild, no c-pap at this point   Tobacco abuse     Past Surgical History:  Procedure Laterality Date   APPENDECTOMY  1974   AV FISTULA PLACEMENT Left 02/19/2020   Procedure: LEFT ARM BRACHIOCEPHALIC  ARTERIOVENOUS (AV) FISTULA CREATION;  Surgeon: Sheree Penne Bruckner, MD;  Location: [facility] OR;  Service: Vascular;  Laterality: Left;   CARDIAC CATHETERIZATION  02/07/2008   Recommendation-F/U stress test   CARDIAC CATHETERIZATION  04/16/2011   Recommendation-Increase medical therapy trial   CARDIOVASCULAR STRESS TEST  03/25/2011   Suspect subtle anterior septal ischemia   COLONOSCOPY     FISTULA SUPERFICIALIZATION Left 05/13/2020   Procedure: FISTULA SUPERFICIALIZATION LEFT;  Surgeon: Sheree Penne Bruckner, MD;  Location: [facility] OR;  Service: Vascular;  Laterality: Left;   FRACTURE SURGERY Left 1972   forearm fracture   LEFT HEART CATHETERIZATION WITH CORONARY ANGIOGRAM N/A 04/16/2011   Procedure: LEFT HEART CATHETERIZATION WITH CORONARY ANGIOGRAM;  Surgeon: Debby DELENA Sor, MD;  Location: [facility] CATH LAB;  Service: Cardiovascular;  Laterality: N/A;   LEFT HEART CATHETERIZATION WITH CORONARY ANGIOGRAM N/A 09/01/2012   Procedure: LEFT HEART CATHETERIZATION WITH CORONARY ANGIOGRAM;  Surgeon: Vinie KYM Maxcy, MD;  Location: [facility] CATH LAB;  Service: Cardiovascular;  Laterality: N/A;   POLYPECTOMY     RENAL DOPPLER  06/05/2009  No evidence of significant diameter reduction   TRANSTHORACIC ECHOCARDIOGRAM  01/25/2008   EF 60-65%, Moderate concentric LVH   WRIST FRACTURE SURGERY Right ~ 1996    Family History  Problem Relation Age of Onset   Stroke Father    Hypertension Father    Coronary artery disease Brother    Diabetes Mother    Stroke Maternal Grandmother    Cancer Maternal Grandmother        Breast cancer   Hypertension Maternal Grandfather    Heart attack Maternal Grandfather    Heart attack Maternal Aunt 41   Colon cancer Neg Hx    Esophageal cancer Neg Hx    Stomach cancer Neg Hx     Rectal cancer Neg Hx     Social History   Socioeconomic History   Marital status: Divorced    Spouse name: Not on file   Number of children: 1   Years of education: Not on file   Highest education level: Bachelor's degree (e.g., BA, AB, BS)  Occupational History   Occupation: Geophysicist/field seismologist: [employer]  Tobacco Use   Smoking status: Former    Current packs/day: 0.00    Average packs/day: 0.3 packs/day for 30.0 years (7.5 ttl pk-yrs)    Types: Cigarettes    Start date: 10/24/1991    Quit date: 10/23/2021    Years since quitting: 1.9   Smokeless tobacco: Never   Tobacco comments:    2-  weeks  Vaping Use   Vaping status: Never Used  Substance and Sexual Activity   Alcohol use: No    Alcohol/week: 0.0 standard drinks of alcohol   Drug use: No   Sexual activity: Not Currently    Partners: Male  Other Topics Concern   Not on file  Social History Narrative   Lives with her son and her aunt lives with them as well.    Social Drivers of Corporate investment banker Strain: Low Risk  (02/14/2023)   Overall Financial Resource Strain (CARDIA)    Difficulty of Paying Living Expenses: Not very hard  Food Insecurity: No Food Insecurity (02/14/2023)   Hunger Vital Sign    Worried About Running Out of Food in the Last Year: Never true    Ran Out of Food in the Last Year: Never true  Transportation Needs: No Transportation Needs (02/14/2023)   PRAPARE - Administrator, Civil Service (Medical): No    Lack of Transportation (Non-Medical): No  Physical Activity: Inactive (02/14/2023)   Exercise Vital Sign    Days of Exercise per Week: 0 days    Minutes of Exercise per Session: 0 min  Stress: No Stress Concern Present (02/14/2023)   [employer] of Occupational Health - Occupational Stress Questionnaire    Feeling of Stress : Only a little  Social Connections: Unknown (02/14/2023)   Social Connection and Isolation Panel    Frequency of Communication with  Friends and Family: Twice a week    Frequency of Social Gatherings with Friends and Family: Patient declined    Attends Religious Services: 1 to 4 times per year    Active Member of [employer] or Organizations: No    Attends Engineer, structural: Not on file    Marital Status: Divorced  Intimate Partner Violence: Not At Risk (10/25/2021)   Humiliation, Afraid, Rape, and Kick questionnaire    Fear of Current or Ex-Partner: No    Emotionally Abused: No    Physically  Abused: No    Sexually Abused: No    Review of Systems  Constitutional:  Negative for chills and fever.  Respiratory:  Positive for cough.   Gastrointestinal:  Positive for nausea. Negative for vomiting.  All other systems reviewed and are negative.       Objective    BP (!) 194/77   Pulse 67   Temp (!) 97.5 F (36.4 C) (Oral)   Resp 16   Ht 5' 4 (1.626 m)   Wt 194 lb 9.6 oz (88.3 kg)   LMP 12/14/2013   SpO2 92%   BMI 33.40 kg/m   Physical Exam Vitals and nursing note reviewed.  Constitutional:      General: She is not in acute distress. Cardiovascular:     Rate and Rhythm: Normal rate and regular rhythm.  Pulmonary:     Effort: Pulmonary effort is normal.     Breath sounds: Normal breath sounds.  Abdominal:     Palpations: Abdomen is soft.     Tenderness: There is no abdominal tenderness.  Neurological:     General: No focal deficit present.     Mental Status: She is alert and oriented to person, place, and time.         Assessment & Plan:   1. Nausea without vomiting (Primary) Zofran  prescribed.   2. Chronic cough Referral to consultant for further eval/mgt - Ambulatory referral to Pulmonology  3. Elevated blood pressure reading in office with diagnosis of hypertension Discussed compliance  4. Smoker Discussed reduction/cessation - Ambulatory referral to Pulmonology   No follow-ups on file.   Tanda Raguel SQUIBB, MD

## 2023-10-18 DIAGNOSIS — N186 End stage renal disease: Secondary | ICD-10-CM | POA: Diagnosis not present

## 2023-10-18 DIAGNOSIS — N2581 Secondary hyperparathyroidism of renal origin: Secondary | ICD-10-CM | POA: Diagnosis not present

## 2023-10-18 DIAGNOSIS — Z992 Dependence on renal dialysis: Secondary | ICD-10-CM | POA: Diagnosis not present

## 2023-10-18 DIAGNOSIS — Z23 Encounter for immunization: Secondary | ICD-10-CM | POA: Diagnosis not present

## 2023-10-20 DIAGNOSIS — N186 End stage renal disease: Secondary | ICD-10-CM | POA: Diagnosis not present

## 2023-10-20 DIAGNOSIS — Z992 Dependence on renal dialysis: Secondary | ICD-10-CM | POA: Diagnosis not present

## 2023-10-20 DIAGNOSIS — N2581 Secondary hyperparathyroidism of renal origin: Secondary | ICD-10-CM | POA: Diagnosis not present

## 2023-10-20 DIAGNOSIS — Z23 Encounter for immunization: Secondary | ICD-10-CM | POA: Diagnosis not present

## 2023-10-22 DIAGNOSIS — Z23 Encounter for immunization: Secondary | ICD-10-CM | POA: Diagnosis not present

## 2023-10-22 DIAGNOSIS — Z992 Dependence on renal dialysis: Secondary | ICD-10-CM | POA: Diagnosis not present

## 2023-10-22 DIAGNOSIS — N186 End stage renal disease: Secondary | ICD-10-CM | POA: Diagnosis not present

## 2023-10-22 DIAGNOSIS — N2581 Secondary hyperparathyroidism of renal origin: Secondary | ICD-10-CM | POA: Diagnosis not present

## 2023-10-25 DIAGNOSIS — L299 Pruritus, unspecified: Secondary | ICD-10-CM | POA: Diagnosis not present

## 2023-10-25 DIAGNOSIS — N2581 Secondary hyperparathyroidism of renal origin: Secondary | ICD-10-CM | POA: Diagnosis not present

## 2023-10-25 DIAGNOSIS — N186 End stage renal disease: Secondary | ICD-10-CM | POA: Diagnosis not present

## 2023-10-25 DIAGNOSIS — Z992 Dependence on renal dialysis: Secondary | ICD-10-CM | POA: Diagnosis not present

## 2023-10-27 DIAGNOSIS — Z992 Dependence on renal dialysis: Secondary | ICD-10-CM | POA: Diagnosis not present

## 2023-10-27 DIAGNOSIS — N186 End stage renal disease: Secondary | ICD-10-CM | POA: Diagnosis not present

## 2023-10-27 DIAGNOSIS — N2581 Secondary hyperparathyroidism of renal origin: Secondary | ICD-10-CM | POA: Diagnosis not present

## 2023-10-27 DIAGNOSIS — L299 Pruritus, unspecified: Secondary | ICD-10-CM | POA: Diagnosis not present

## 2023-11-01 DIAGNOSIS — N186 End stage renal disease: Secondary | ICD-10-CM | POA: Diagnosis not present

## 2023-11-01 DIAGNOSIS — Z992 Dependence on renal dialysis: Secondary | ICD-10-CM | POA: Diagnosis not present

## 2023-11-01 DIAGNOSIS — N2581 Secondary hyperparathyroidism of renal origin: Secondary | ICD-10-CM | POA: Diagnosis not present

## 2023-11-01 DIAGNOSIS — R52 Pain, unspecified: Secondary | ICD-10-CM | POA: Diagnosis not present

## 2023-11-03 DIAGNOSIS — N186 End stage renal disease: Secondary | ICD-10-CM | POA: Diagnosis not present

## 2023-11-03 DIAGNOSIS — Z992 Dependence on renal dialysis: Secondary | ICD-10-CM | POA: Diagnosis not present

## 2023-11-03 DIAGNOSIS — R52 Pain, unspecified: Secondary | ICD-10-CM | POA: Diagnosis not present

## 2023-11-03 DIAGNOSIS — N2581 Secondary hyperparathyroidism of renal origin: Secondary | ICD-10-CM | POA: Diagnosis not present

## 2023-11-03 NOTE — Progress Notes (Signed)
 [name]                                          MRN: [id number]   11/03/2023   The [facility] Quality Team Specialist reviewed this patient medical record for the purposes of chart review for care gap closure. The following were reviewed: chart review for care gap closure-glycemic status assessment.    [facility] Quality Team

## 2023-11-05 DIAGNOSIS — R52 Pain, unspecified: Secondary | ICD-10-CM | POA: Diagnosis not present

## 2023-11-05 DIAGNOSIS — N186 End stage renal disease: Secondary | ICD-10-CM | POA: Diagnosis not present

## 2023-11-05 DIAGNOSIS — Z992 Dependence on renal dialysis: Secondary | ICD-10-CM | POA: Diagnosis not present

## 2023-11-05 DIAGNOSIS — N2581 Secondary hyperparathyroidism of renal origin: Secondary | ICD-10-CM | POA: Diagnosis not present

## 2023-11-08 ENCOUNTER — Other Ambulatory Visit: Payer: Self-pay | Admitting: Family Medicine

## 2023-11-08 DIAGNOSIS — Z992 Dependence on renal dialysis: Secondary | ICD-10-CM | POA: Diagnosis not present

## 2023-11-08 DIAGNOSIS — N186 End stage renal disease: Secondary | ICD-10-CM | POA: Diagnosis not present

## 2023-11-08 DIAGNOSIS — N2581 Secondary hyperparathyroidism of renal origin: Secondary | ICD-10-CM | POA: Diagnosis not present

## 2023-11-08 NOTE — Telephone Encounter (Signed)
 Please send request to ordering prescriber Raguel Blush, MD. Thank you.

## 2023-11-10 DIAGNOSIS — Z992 Dependence on renal dialysis: Secondary | ICD-10-CM | POA: Diagnosis not present

## 2023-11-10 DIAGNOSIS — N186 End stage renal disease: Secondary | ICD-10-CM | POA: Diagnosis not present

## 2023-11-10 DIAGNOSIS — N2581 Secondary hyperparathyroidism of renal origin: Secondary | ICD-10-CM | POA: Diagnosis not present

## 2023-11-11 ENCOUNTER — Ambulatory Visit (INDEPENDENT_AMBULATORY_CARE_PROVIDER_SITE_OTHER)

## 2023-11-11 ENCOUNTER — Encounter: Payer: Self-pay | Admitting: Internal Medicine

## 2023-11-11 ENCOUNTER — Ambulatory Visit: Admitting: Internal Medicine

## 2023-11-11 VITALS — BP 120/72 | HR 69 | Temp 97.6°F | Ht 64.0 in | Wt 190.0 lb

## 2023-11-11 DIAGNOSIS — R059 Cough, unspecified: Secondary | ICD-10-CM | POA: Diagnosis not present

## 2023-11-11 DIAGNOSIS — F17210 Nicotine dependence, cigarettes, uncomplicated: Secondary | ICD-10-CM

## 2023-11-11 DIAGNOSIS — J4489 Other specified chronic obstructive pulmonary disease: Secondary | ICD-10-CM

## 2023-11-11 DIAGNOSIS — Z992 Dependence on renal dialysis: Secondary | ICD-10-CM | POA: Diagnosis not present

## 2023-11-11 DIAGNOSIS — E1122 Type 2 diabetes mellitus with diabetic chronic kidney disease: Secondary | ICD-10-CM | POA: Diagnosis not present

## 2023-11-11 DIAGNOSIS — I10 Essential (primary) hypertension: Secondary | ICD-10-CM

## 2023-11-11 DIAGNOSIS — N186 End stage renal disease: Secondary | ICD-10-CM | POA: Diagnosis not present

## 2023-11-11 MED ORDER — BREZTRI AEROSPHERE 160-9-4.8 MCG/ACT IN AERO: 2.0000 | INHALATION_SPRAY | Freq: Two times a day (BID) | RESPIRATORY_TRACT | Status: DC

## 2023-11-11 MED ORDER — BUDESONIDE-FORMOTEROL FUMARATE 80-4.5 MCG/ACT IN AERO: INHALATION_SPRAY | RESPIRATORY_TRACT | 12 refills | Status: DC

## 2023-11-11 MED ORDER — BISOPROLOL FUMARATE 10 MG PO TABS
ORAL_TABLET | ORAL | 11 refills | Status: AC
Start: 2023-11-11 — End: ?

## 2023-11-11 NOTE — Assessment & Plan Note (Addendum)
 Changed coreg  to Bisoprolol 10 mg bid due to clinical dx of AB   Comment  In the setting of respiratory symptoms of unknown etiology,  It would be preferable to use bystolic , the most beta -1  selective Beta blocker available in sample form, with bisoprolol the most selective generic choice  on the market, at least on a trial basis, to make sure the spillover Beta 2 effects of the less specific Beta blockers are not contributing to this patient's symptoms.

## 2023-11-11 NOTE — Patient Instructions (Addendum)
 Plan A = Automatic = Always=    Breztri (symbicort/breyna) Take 2 puffs first thing in am and then another 2 puffs about 12 hours later.    Work on inhaler technique:  relax and gently blow all the way out then take a nice smooth full deep breath back in, triggering the inhaler at same time you start breathing in.  Hold breath in for at least  5 seconds if you can. Blow out  thru nose. Rinse and gargle with water when done.  If mouth or throat bother you at all,  try brushing teeth/gums/tongue with arm and hammer toothpaste/ make a slurry and gargle and spit out.      The key is to stop smoking completely before smoking completely stops you!  Stop corevidol and start Bisoprolol 10 mg twice daily in its place     Please remember to go to the  x-ray department  for your tests - we will call you with the results when they are available     Please schedule a follow up office visit in 8  weeks, sooner if needed  with PFTs same day

## 2023-11-11 NOTE — Assessment & Plan Note (Addendum)
 Active smoker - Try off high dose coreg  11/11/2023  - 11/11/2023  After extensive coaching inhaler device,  effectiveness =    75% hfa  >>> try breztri sample then maint on symbicort 80 2bid pending pfts

## 2023-11-11 NOTE — Progress Notes (Signed)
 [name], female    DOB: 07-Jun-1960   MRN: [id number]   Brief patient profile:  63 yowf   active smoker  on HD x 2023  referred to pulmonary clinic 11/11/2023 by renal doc for  cough/ doe       Pt not previously seen by PCCM service.    History of Present Illness  11/11/2023  Pulmonary/ 1st office eval/Anthany Thornhill  Chief Complaint  Patient presents with   Cough    Patient coughs up clear sputum sometimes. Started 6 months ago   Consult   Dyspnea:  [employer] [facility] [employer] [facility] parking Cough: 2022 (6 m p started HD which corresponds to when started smoking again)  Each am 5 min p stirring  sproradic all day,sometimes worse with laughing / one urinary cont/ and immediately when lie down x 5 min > mucoid min vol then resolves Sleep: flat bed 2  pillows   SABA use: none  02 [id number]     No obvious day to day or daytime pattern/variability or assoc  mucus plugs or hemoptysis or cp or chest tightness, subjective wheeze or overt  hb symptoms.    Also denies any obvious fluctuation of symptoms with weather or environmental changes or other aggravating or alleviating factors except as outlined above   No unusual exposure hx or h/o childhood pna/ asthma or knowledge of premature birth.  Current Allergies, Complete Past Medical History, Past Surgical History, Family History, and Social History were reviewed in [employer] record.  ROS  The following are not active complaints unless bolded Hoarseness, sore throat, dysphagia, dental problems, itching, sneezing,  nasal congestion or discharge of excess mucus or purulent secretions, ear ache,   fever, chills, sweats, unintended wt loss or wt gain, classically pleuritic or exertional cp,  orthopnea pnd or arm/hand swelling  or leg swelling, presyncope, palpitations, abdominal pain, anorexia, nausea, vomiting, diarrhea  or change in bowel habits or change in bladder habits, change in stools or change in urine, dysuria,  hematuria,  rash, arthralgias, visual complaints, headache, numbness, weakness or ataxia or problems with walking or coordination,  change in mood or  memory.          Outpatient Medications Prior to Visit  Medication Sig Dispense Refill   amLODipine  (NORVASC ) 10 MG tablet Take 1 tablet (10 mg total) by mouth daily. 30 tablet 0   hydrALAZINE  (APRESOLINE ) 100 MG tablet Take 100 mg by mouth 2 (two) times daily.     sevelamer  carbonate (RENVELA ) 800 MG tablet Take 1,600 mg by mouth 3 (three) times daily with meals.     carvedilol  (COREG ) 25 MG tablet Take 25 mg by mouth 2 (two) times daily.     cyclobenzaprine  (FLEXERIL ) 5 MG tablet Take 1 tablet (5 mg total) by mouth 3 (three) times daily as needed for muscle spasms. 30 tablet 1   fluticasone  (FLONASE ) 50 MCG/ACT nasal spray Place 2 sprays into both nostrils daily. 16 g 1   HYDROcodone -acetaminophen  (NORCO/VICODIN) 5-325 MG tablet Take 1 tablet by mouth every 4 (four) hours as needed.     ondansetron  (ZOFRAN -ODT) 4 MG disintegrating tablet Take 1 tablet (4 mg total) by mouth every 8 (eight) hours as needed for nausea or vomiting. 20 tablet 0   No facility-administered medications prior to visit.    Past Medical History:  Diagnosis Date   Angina    CAD (coronary artery disease), non obstructive CAD in 2010    a. 2013  Cath: LAD 60, RCA 14m, 70d; b. 2014 Cath: stable anatomy.   Chronic kidney disease    Stage 5; polycystic kidney disease , elevated creatine   COPD (chronic obstructive pulmonary disease) (HCC)    Diabetes mellitus without complication (HCC)    a. 12/2014 HbA1c 6.5; b. Rx Glipizide  - not taking.   Dysrhythmia    Family history of premature CAD    GERD (gastroesophageal reflux disease)    Hx of adenomatous colonic polyps 06/11/2016   Hyperlipidemia    Hypertension    Hypertensive heart disease    Palpitations    Reflux    Sleep apnea    mild, no c-pap at this point   Tobacco abuse       Objective:     BP 120/72    Pulse 69   Temp 97.6 F (36.4 C) (Oral)   Ht 5' 4 (1.626 m)   Wt 190 lb (86.2 kg)   LMP 12/14/2013   SpO2 95% Comment: ROOM AIR  BMI 32.61 kg/m   SpO2: 95 % (ROOM AIR)     HEENT : Oropharynx  clear      Nasal turbinates mod severe Turbiate edema    NECK :  without  apparent JVD/ palpable Nodes/TM    LUNGS: no acc muscle use,  Mild kyphotic  chest which is clear to A and P bilaterally without cough on insp or exp maneuvers   CV:  RRR  no s3 or murmur or increase in P2, and no edema   ABD:  soft and nontender   MS:  Gait nl   ext warm without deformities Or obvious joint restrictions  calf tenderness, cyanosis or clubbing    SKIN: warm and dry without lesions    NEURO:  alert, approp, nl sensorium with  no motor or cerebellar deficits apparent.    CXR PA and Lateral:   11/11/2023 :    I personally reviewed images and impression is as follows:     Mod kyphosis/ mild CM / no acute findings    Assessment   Assessment & Plan Asthmatic bronchitis , chronic (HCC) Active smoker - Try off high dose coreg  11/11/2023  - 11/11/2023  After extensive coaching inhaler device,  effectiveness =    75% hfa  >>> try breztri sample then maint on symbicort 80 2bid pending pfts   Essential hypertension Changed coreg  to Bisoprolol 10 mg bid due to clinical dx of AB   Comment  In the setting of respiratory symptoms of unknown etiology,  It would be preferable to use bystolic , the most beta -1  selective Beta blocker available in sample form, with bisoprolol the most selective generic choice  on the market, at least on a trial basis, to make sure the spillover Beta 2 effects of the less specific Beta blockers are not contributing to this patient's symptoms.   Cigarette smoker Counseled re importance of smoking cessation but did not meet time criteria for separate billing     Low-dose CT lung cancer screening is recommended for patients who are 29-49 years of age with a 20+ pack-year  history of smoking and who are currently smoking or quit <=15 years ago. No coughing up blood  No unintentional weight loss of > 15 pounds in the last 6 months - pt is eligible for scanning yearly until 15 years p quits  > will address at next ov          Each maintenance medication was reviewed in  detail including emphasizing most importantly the difference between maintenance and prns and under what circumstances the prns are to be triggered using an action plan format where appropriate.  Total time for H and P, chart review, counseling, reviewing hfa  device(s) and generating customized AVS unique to this office visit / same day charting = 65 min new pt eval   for multiple  refractory respiratory  symptoms of uncertain etiology           AVS  Patient Instructions  Plan A = Automatic = Always=    Breztri (symbicort/breyna) Take 2 puffs first thing in am and then another 2 puffs about 12 hours later.    Work on inhaler technique:  relax and gently blow all the way out then take a nice smooth full deep breath back in, triggering the inhaler at same time you start breathing in.  Hold breath in for at least  5 seconds if you can. Blow out  thru nose. Rinse and gargle with water when done.  If mouth or throat bother you at all,  try brushing teeth/gums/tongue with arm and hammer toothpaste/ make a slurry and gargle and spit out.      The key is to stop smoking completely before smoking completely stops you!  Stop corevidol and start Bisoprolol 10 mg twice daily in its place     Please remember to go to the  x-ray department  for your tests - we will call you with the results when they are available     Please schedule a follow up office visit in 8  weeks, sooner if needed  with PFTs same day         Ozell America, MD 11/11/2023

## 2023-11-11 NOTE — Assessment & Plan Note (Addendum)
 Counseled re importance of smoking cessation but did not meet time criteria for separate billing     Low-dose CT lung cancer screening is recommended for patients who are 70-61 years of age with a 20+ pack-year history of smoking and who are currently smoking or quit <=15 years ago. No coughing up blood  No unintentional weight loss of > 15 pounds in the last 6 months - pt is eligible for scanning yearly until 15 years p quits  > will address at next ov          Each maintenance medication was reviewed in detail including emphasizing most importantly the difference between maintenance and prns and under what circumstances the prns are to be triggered using an action plan format where appropriate.  Total time for H and P, chart review, counseling, reviewing hfa  device(s) and generating customized AVS unique to this office visit / same day charting = 65 min new pt eval   for multiple  refractory respiratory  symptoms of uncertain etiology

## 2023-11-12 DIAGNOSIS — N2581 Secondary hyperparathyroidism of renal origin: Secondary | ICD-10-CM | POA: Diagnosis not present

## 2023-11-12 DIAGNOSIS — N186 End stage renal disease: Secondary | ICD-10-CM | POA: Diagnosis not present

## 2023-11-12 DIAGNOSIS — Z992 Dependence on renal dialysis: Secondary | ICD-10-CM | POA: Diagnosis not present

## 2023-11-13 ENCOUNTER — Ambulatory Visit: Payer: Self-pay | Admitting: Internal Medicine

## 2023-11-15 DIAGNOSIS — N186 End stage renal disease: Secondary | ICD-10-CM | POA: Diagnosis not present

## 2023-11-15 DIAGNOSIS — N2581 Secondary hyperparathyroidism of renal origin: Secondary | ICD-10-CM | POA: Diagnosis not present

## 2023-11-15 DIAGNOSIS — Z992 Dependence on renal dialysis: Secondary | ICD-10-CM | POA: Diagnosis not present

## 2023-11-17 ENCOUNTER — Other Ambulatory Visit: Payer: Self-pay | Admitting: Nephrology

## 2023-11-17 DIAGNOSIS — R319 Hematuria, unspecified: Secondary | ICD-10-CM

## 2023-11-17 DIAGNOSIS — N200 Calculus of kidney: Secondary | ICD-10-CM

## 2023-11-19 DIAGNOSIS — N186 End stage renal disease: Secondary | ICD-10-CM | POA: Diagnosis not present

## 2023-11-19 DIAGNOSIS — Z992 Dependence on renal dialysis: Secondary | ICD-10-CM | POA: Diagnosis not present

## 2023-11-19 DIAGNOSIS — N2581 Secondary hyperparathyroidism of renal origin: Secondary | ICD-10-CM | POA: Diagnosis not present

## 2023-11-22 DIAGNOSIS — Z992 Dependence on renal dialysis: Secondary | ICD-10-CM | POA: Diagnosis not present

## 2023-11-22 DIAGNOSIS — N186 End stage renal disease: Secondary | ICD-10-CM | POA: Diagnosis not present

## 2023-11-22 DIAGNOSIS — N2581 Secondary hyperparathyroidism of renal origin: Secondary | ICD-10-CM | POA: Diagnosis not present

## 2023-11-24 DIAGNOSIS — Z992 Dependence on renal dialysis: Secondary | ICD-10-CM | POA: Diagnosis not present

## 2023-11-24 DIAGNOSIS — N186 End stage renal disease: Secondary | ICD-10-CM | POA: Diagnosis not present

## 2023-11-24 DIAGNOSIS — N2581 Secondary hyperparathyroidism of renal origin: Secondary | ICD-10-CM | POA: Diagnosis not present

## 2023-11-25 DIAGNOSIS — N2581 Secondary hyperparathyroidism of renal origin: Secondary | ICD-10-CM | POA: Diagnosis not present

## 2023-11-25 DIAGNOSIS — Z992 Dependence on renal dialysis: Secondary | ICD-10-CM | POA: Diagnosis not present

## 2023-11-25 DIAGNOSIS — N186 End stage renal disease: Secondary | ICD-10-CM | POA: Diagnosis not present

## 2023-11-26 DIAGNOSIS — Z992 Dependence on renal dialysis: Secondary | ICD-10-CM | POA: Diagnosis not present

## 2023-11-26 DIAGNOSIS — N2581 Secondary hyperparathyroidism of renal origin: Secondary | ICD-10-CM | POA: Diagnosis not present

## 2023-11-26 DIAGNOSIS — N186 End stage renal disease: Secondary | ICD-10-CM | POA: Diagnosis not present

## 2023-11-28 ENCOUNTER — Ambulatory Visit (INDEPENDENT_AMBULATORY_CARE_PROVIDER_SITE_OTHER)

## 2023-11-28 VITALS — BP 138/70 | HR 72 | Temp 98.1°F | Resp 16 | Ht 64.0 in | Wt 192.0 lb

## 2023-11-28 DIAGNOSIS — Z0000 Encounter for general adult medical examination without abnormal findings: Secondary | ICD-10-CM | POA: Diagnosis not present

## 2023-11-28 NOTE — Progress Notes (Signed)
 Patient ID: [name], female    DOB: Apr 30, 1960  MRN: [id number]  CC: Annual Exam (-Patient is here to have annually  complete physical examination /-Care gap address/-labs taken )   Subjective: [name] is a 63 y.o. female with past medical history of hypertension who presents to clinic for annual complete physical exam. No acute concerns at this time.    Patient Active Problem List   Diagnosis Date Noted   Asthmatic bronchitis , chronic (HCC) 11/11/2023   Cigarette smoker 11/11/2023   Encounter for immunization 02/23/2022   Type 2 diabetes mellitus (HCC) 02/04/2022   GERD (gastroesophageal reflux disease) 02/04/2022   Cyst of skin 01/08/2022   Morbid obesity due to excess calories (HCC) 01/08/2022   Pain in right upper arm 01/08/2022   Skin lesion 01/08/2022   Pre-transplant evaluation for kidney transplant 01/08/2022   Screening for cervical cancer 01/08/2022   Hospital discharge follow-up 11/10/2021   Encounter for screening for respiratory tuberculosis 10/31/2021   Pruritus, unspecified 10/31/2021   Iron deficiency anemia, unspecified 10/31/2021   Other disorders of phosphorus metabolism 10/31/2021   Pain, unspecified 10/31/2021   Secondary hyperparathyroidism of renal origin 10/31/2021   End stage renal disease (HCC) 10/31/2021   Hypercalcemia 10/31/2021   Coagulation defect, unspecified 10/31/2021   Diarrhea, unspecified 10/31/2021   Dyspnea, unspecified 10/31/2021   Anaphylactic shock, unspecified, initial encounter 10/31/2021   Allergy, unspecified, initial encounter 10/31/2021   Anemia in chronic kidney disease 10/29/2021   Chronic diastolic (congestive) heart failure (HCC) 10/29/2021   Atherosclerotic heart disease of native coronary artery without angina pectoris 10/29/2021   Polycystic kidney, unspecified 10/29/2021   Type 2 diabetes mellitus with diabetic chronic kidney disease (HCC) 10/29/2021   Long term (current) use of aspirin  10/29/2021    Personal history of nicotine  dependence 10/29/2021   Hyperlipidemia 10/29/2021   Hypertensive heart and chronic kidney disease with heart failure and with stage 5 chronic kidney disease, or end stage renal disease (HCC) 10/29/2021   COPD (chronic obstructive pulmonary disease) (HCC) 10/29/2021   Dependence on renal dialysis 10/29/2021   Hypertensive urgency 10/24/2021   CKD (chronic kidney disease), stage V (HCC) 10/24/2021   Hypertensive heart disease 08/13/2020   Diabetes mellitus (HCC) 08/13/2020   Dietary counseling and surveillance 03/12/2020   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   Complex renal cyst 11/29/2019   Acute on chronic diastolic heart failure (HCC) 11/13/2019   Edema 10/18/2019   Truncal obesity 10/18/2019   DM (diabetes mellitus), type 2 with renal complications (HCC)    Hx of adenomatous colonic polyps 06/11/2016   Diverticulosis 06/07/2016   Sleep apnea 03/18/2016   CRI (chronic renal insufficiency), stage 4 (severe) 03/18/2016   Increased risk of breast cancer 12/17/2015   Hematoma of groin - right, s/p cath; stable on discharge 09/02/2012   Angina pectoris, unspecified 08/31/2012   Obesity, Class III, BMI 40-49.9 (morbid obesity) (HCC) 08/31/2012   CAD (coronary artery disease) 04/15/2011   Abnormal stress test, Lexiscan  myoview  03/25/2011-sm. anterior ischemia 04/15/2011   Essential hypertension 04/15/2011   Dyslipidemia 04/15/2011   Metabolic syndrome 04/15/2011   Tobacco abuse 04/15/2011   Family history of premature CAD 04/15/2011   Coronary atherosclerosis 04/15/2011     Current Outpatient Medications on File Prior to Visit  Medication Sig Dispense Refill   amLODipine  (NORVASC ) 10 MG tablet Take 1 tablet (10 mg total) by mouth daily. 30 tablet 0   bisoprolol (ZEBETA) 10 MG tablet One twice daily 60 tablet  11   hydrALAZINE  (APRESOLINE ) 100 MG tablet Take 100 mg by mouth 2 (two) times daily.     sevelamer  carbonate (RENVELA ) 800 MG tablet Take  1,600 mg by mouth 3 (three) times daily with meals.     No current facility-administered medications on file prior to visit.    Allergies  Allergen Reactions   Bupropion Other (See Comments) and Nausea And Vomiting    Suicidal thoughts and increased depression    Suicidal thoughts and increased depression unknown Other reaction(s): Other Suicidal thoughts and increased depression Suicidal thoughts and increased depression  Suicidal thoughts and increased depression, unknown, Other reaction(s): Other, Suicidal thoughts and increased depression, Suicidal thoughts and increased depression   Doxazosin  Mesylate     Increased heart rate   Ezetimibe  Nausea And Vomiting    myalgias  myalgias, myalgias   Rosuvastatin  Other (See Comments) and Nausea And Vomiting    myalgia  myalgia, myalgia   Simvastatin Nausea And Vomiting    myalgias  myalgias, myalgias   Wellbutrin [Bupropion Hcl] Other (See Comments)    Suicidal thoughts and increased depression   Doxazosin  Nausea And Vomiting and Palpitations    Increased heart rate    Social History   Socioeconomic History   Marital status: Divorced    Spouse name: Not on file   Number of children: 1   Years of education: Not on file   Highest education level: Bachelor's degree (e.g., BA, AB, BS)  Occupational History   Occupation: Geophysicist/field Seismologist: [employer]  Tobacco Use   Smoking status: Former    Current packs/day: 0.00    Average packs/day: 0.3 packs/day for 30.0 years (7.5 ttl pk-yrs)    Types: Cigarettes    Start date: 10/24/1991    Quit date: 10/23/2021    Years since quitting: 2.0   Smokeless tobacco: Never   Tobacco comments:    2-  weeks  Vaping Use   Vaping status: Never Used  Substance and Sexual Activity   Alcohol use: No    Alcohol/week: 0.0 standard drinks of alcohol   Drug use: No   Sexual activity: Not Currently    Partners: Male  Other Topics Concern   Not on file  Social History Narrative    Lives with her son and her aunt lives with them as well.    Social Drivers of Health   Financial Resource Strain: Medium Risk (11/28/2023)   Overall Financial Resource Strain (CARDIA)    Difficulty of Paying Living Expenses: Somewhat hard  Food Insecurity: Food Insecurity Present (11/28/2023)   Hunger Vital Sign    Worried About Running Out of Food in the Last Year: Sometimes true    Ran Out of Food in the Last Year: Sometimes true  Transportation Needs: No Transportation Needs (11/28/2023)   PRAPARE - Administrator, Civil Service (Medical): No    Lack of Transportation (Non-Medical): No  Physical Activity: Inactive (11/28/2023)   Exercise Vital Sign    Days of Exercise per Week: 0 days    Minutes of Exercise per Session: Not on file  Stress: Stress Concern Present (11/28/2023)   [employer] of Occupational Health - Occupational Stress Questionnaire    Feeling of Stress: To some extent  Social Connections: Moderately Isolated (11/28/2023)   Social Connection and Isolation Panel    Frequency of Communication with Friends and Family: Twice a week    Frequency of Social Gatherings with Friends and Family: Twice a week  Attends Religious Services: More than 4 times per year    Active Member of Clubs or Organizations: No    Attends Banker Meetings: Never    Marital Status: Divorced  Catering Manager Violence: Not At Risk (10/25/2021)   Humiliation, Afraid, Rape, and Kick questionnaire    Fear of Current or Ex-Partner: No    Emotionally Abused: No    Physically Abused: No    Sexually Abused: No    Family History  Problem Relation Age of Onset   Stroke Father    Hypertension Father    Coronary artery disease Brother    Diabetes Mother    Stroke Maternal Grandmother    Cancer Maternal Grandmother        Breast cancer   Hypertension Maternal Grandfather    Heart attack Maternal Grandfather    Heart attack Maternal Aunt 55   Colon cancer Neg  Hx    Esophageal cancer Neg Hx    Stomach cancer Neg Hx    Rectal cancer Neg Hx     Past Surgical History:  Procedure Laterality Date   APPENDECTOMY  1974   AV FISTULA PLACEMENT Left 02/19/2020   Procedure: LEFT ARM BRACHIOCEPHALIC  ARTERIOVENOUS (AV) FISTULA CREATION;  Surgeon: Sheree Penne Bruckner, MD;  Location: [facility] OR;  Service: Vascular;  Laterality: Left;   CARDIAC CATHETERIZATION  02/07/2008   Recommendation-F/U stress test   CARDIAC CATHETERIZATION  04/16/2011   Recommendation-Increase medical therapy trial   CARDIOVASCULAR STRESS TEST  03/25/2011   Suspect subtle anterior septal ischemia   COLONOSCOPY     FISTULA SUPERFICIALIZATION Left 05/13/2020   Procedure: FISTULA SUPERFICIALIZATION LEFT;  Surgeon: Sheree Penne Bruckner, MD;  Location: [facility];  Service: Vascular;  Laterality: Left;   FRACTURE SURGERY Left 1972   forearm fracture   LEFT HEART CATHETERIZATION WITH CORONARY ANGIOGRAM N/A 04/16/2011   Procedure: LEFT HEART CATHETERIZATION WITH CORONARY ANGIOGRAM;  Surgeon: Debby DELENA Sor, MD;  Location: [facility] C[facility] LAB;  Service: Cardiovascular;  Laterality: N/A;   LEFT HEART CATHETERIZATION WITH CORONARY ANGIOGRAM N/A 09/01/2012   Procedure: LEFT HEART CATHETERIZATION WITH CORONARY ANGIOGRAM;  Surgeon: Vinie KYM Maxcy, MD;  Location: [facility] CATH LAB;  Service: Cardiovascular;  Laterality: N/A;   POLYPECTOMY     RENAL DOPPLER  06/05/2009   No evidence of significant diameter reduction   TRANSTHORACIC ECHOCARDIOGRAM  01/25/2008   EF 60-65%, Moderate concentric LVH   WRIST FRACTURE SURGERY Right ~ 1996    ROS: Review of Systems Negative except as stated above  PHYSICAL EXAM: BP 138/70   Pulse 72   Temp 98.1 F (36.7 C) (Oral)   Resp 16   Ht 5' 4 (1.626 m)   Wt 192 lb (87.1 kg)   LMP 12/14/2013   SpO2 92%   BMI 32.96 kg/m   Physical Exam  General: well-appearing, no acute distress Skin: no jaundice, rashes, or lesions Head: normocephalic, no lesions, no abnormal hair  distribution or loss Eyes: anicteric sclera, pupils equally round and reactive to light and accommodation, extraocular movements intact, wears corrective lenses Ears: no external lesions, tympanic membrane translucent  Nose: no septal deviation, turbinates clear Throat: trachea midline, no thyromegaly, moist mucus membranes. Tender Nodule on left antero-superior surface of neck.  Mouth: Tooth decay noted with caries on multiple teeth. Cardiovascular: regular heart rate and rhythm, normal S1/S2, no murmurs, gallops, or rubs, peripheral pulses 2+ bilaterally Chest: no skeletal deformity, lungs clear to auscultation bilaterally, equal breath sounds bilaterally Abdomen: soft, non-distended, non-tender to  palpation, no hepatomegaly, no splenomegaly, normoactive bowel sounds Musculoskeletal: strength of upper and lower extremities equal bilaterally, 5/5, normal gait Extremities: no peripheral edema, nails intact Neurologic: cranial nerves II-XII intact, brisk patellar reflexes    ASSESSMENT AND PLAN:  1. Encounter for annual wellness visit (Primary) - Complete physical exam performed today, findings listed above.  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 Encouraged pt to reach out to schedule next visit.    Patient was given the opportunity to ask questions.  Patient verbalized understanding of the plan and was able to repeat key elements of the plan.     Sula Leavy Rode, PA-C

## 2023-11-29 DIAGNOSIS — N186 End stage renal disease: Secondary | ICD-10-CM | POA: Diagnosis not present

## 2023-11-29 DIAGNOSIS — R52 Pain, unspecified: Secondary | ICD-10-CM | POA: Diagnosis not present

## 2023-11-29 DIAGNOSIS — N2581 Secondary hyperparathyroidism of renal origin: Secondary | ICD-10-CM | POA: Diagnosis not present

## 2023-11-29 DIAGNOSIS — Z992 Dependence on renal dialysis: Secondary | ICD-10-CM | POA: Diagnosis not present

## 2023-12-03 DIAGNOSIS — N2581 Secondary hyperparathyroidism of renal origin: Secondary | ICD-10-CM | POA: Diagnosis not present

## 2023-12-03 DIAGNOSIS — N186 End stage renal disease: Secondary | ICD-10-CM | POA: Diagnosis not present

## 2023-12-03 DIAGNOSIS — Z992 Dependence on renal dialysis: Secondary | ICD-10-CM | POA: Diagnosis not present

## 2023-12-03 DIAGNOSIS — R52 Pain, unspecified: Secondary | ICD-10-CM | POA: Diagnosis not present

## 2023-12-05 ENCOUNTER — Ambulatory Visit
Admission: RE | Admit: 2023-12-05 | Discharge: 2023-12-05 | Disposition: A | Source: Ambulatory Visit | Attending: Nephrology | Admitting: Nephrology

## 2023-12-05 DIAGNOSIS — N200 Calculus of kidney: Secondary | ICD-10-CM

## 2023-12-05 DIAGNOSIS — N2581 Secondary hyperparathyroidism of renal origin: Secondary | ICD-10-CM | POA: Diagnosis not present

## 2023-12-05 DIAGNOSIS — Z992 Dependence on renal dialysis: Secondary | ICD-10-CM | POA: Diagnosis not present

## 2023-12-05 DIAGNOSIS — N281 Cyst of kidney, acquired: Secondary | ICD-10-CM | POA: Diagnosis not present

## 2023-12-05 DIAGNOSIS — R319 Hematuria, unspecified: Secondary | ICD-10-CM

## 2023-12-05 DIAGNOSIS — N186 End stage renal disease: Secondary | ICD-10-CM | POA: Diagnosis not present

## 2023-12-07 DIAGNOSIS — N186 End stage renal disease: Secondary | ICD-10-CM | POA: Diagnosis not present

## 2023-12-07 DIAGNOSIS — N2581 Secondary hyperparathyroidism of renal origin: Secondary | ICD-10-CM | POA: Diagnosis not present

## 2023-12-07 DIAGNOSIS — Z992 Dependence on renal dialysis: Secondary | ICD-10-CM | POA: Diagnosis not present

## 2023-12-10 DIAGNOSIS — N2581 Secondary hyperparathyroidism of renal origin: Secondary | ICD-10-CM | POA: Diagnosis not present

## 2023-12-10 DIAGNOSIS — N186 End stage renal disease: Secondary | ICD-10-CM | POA: Diagnosis not present

## 2023-12-10 DIAGNOSIS — Z992 Dependence on renal dialysis: Secondary | ICD-10-CM | POA: Diagnosis not present

## 2023-12-11 DIAGNOSIS — Z992 Dependence on renal dialysis: Secondary | ICD-10-CM | POA: Diagnosis not present

## 2023-12-11 DIAGNOSIS — N186 End stage renal disease: Secondary | ICD-10-CM | POA: Diagnosis not present

## 2023-12-11 DIAGNOSIS — E1122 Type 2 diabetes mellitus with diabetic chronic kidney disease: Secondary | ICD-10-CM | POA: Diagnosis not present

## 2023-12-13 DIAGNOSIS — N186 End stage renal disease: Secondary | ICD-10-CM | POA: Diagnosis not present

## 2023-12-13 DIAGNOSIS — Z992 Dependence on renal dialysis: Secondary | ICD-10-CM | POA: Diagnosis not present

## 2023-12-13 DIAGNOSIS — N2581 Secondary hyperparathyroidism of renal origin: Secondary | ICD-10-CM | POA: Diagnosis not present

## 2023-12-17 DIAGNOSIS — Z992 Dependence on renal dialysis: Secondary | ICD-10-CM | POA: Diagnosis not present

## 2023-12-17 DIAGNOSIS — N186 End stage renal disease: Secondary | ICD-10-CM | POA: Diagnosis not present

## 2023-12-17 DIAGNOSIS — N2581 Secondary hyperparathyroidism of renal origin: Secondary | ICD-10-CM | POA: Diagnosis not present

## 2023-12-20 DIAGNOSIS — N2581 Secondary hyperparathyroidism of renal origin: Secondary | ICD-10-CM | POA: Diagnosis not present

## 2023-12-20 DIAGNOSIS — N186 End stage renal disease: Secondary | ICD-10-CM | POA: Diagnosis not present

## 2023-12-20 DIAGNOSIS — Z992 Dependence on renal dialysis: Secondary | ICD-10-CM | POA: Diagnosis not present

## 2023-12-22 DIAGNOSIS — Z992 Dependence on renal dialysis: Secondary | ICD-10-CM | POA: Diagnosis not present

## 2023-12-22 DIAGNOSIS — N2581 Secondary hyperparathyroidism of renal origin: Secondary | ICD-10-CM | POA: Diagnosis not present

## 2023-12-22 DIAGNOSIS — N186 End stage renal disease: Secondary | ICD-10-CM | POA: Diagnosis not present

## 2023-12-23 ENCOUNTER — Telehealth: Payer: Self-pay | Admitting: Internal Medicine

## 2023-12-23 DIAGNOSIS — I10 Essential (primary) hypertension: Secondary | ICD-10-CM

## 2023-12-23 NOTE — Telephone Encounter (Signed)
 Patient states bisoprolol  will need to be called in for a 90 day supply instead of a 30 day supply. [facility] using [facility]. Please advise

## 2023-12-24 DIAGNOSIS — Z992 Dependence on renal dialysis: Secondary | ICD-10-CM | POA: Diagnosis not present

## 2023-12-24 DIAGNOSIS — N186 End stage renal disease: Secondary | ICD-10-CM | POA: Diagnosis not present

## 2023-12-24 DIAGNOSIS — N2581 Secondary hyperparathyroidism of renal origin: Secondary | ICD-10-CM | POA: Diagnosis not present

## 2023-12-26 MED ORDER — BISOPROLOL FUMARATE 10 MG PO TABS: ORAL_TABLET | ORAL | 3 refills | Status: AC

## 2023-12-26 NOTE — Telephone Encounter (Signed)
 90 day rx bisoprolol  was sent to [facility].

## 2023-12-27 DIAGNOSIS — Z992 Dependence on renal dialysis: Secondary | ICD-10-CM | POA: Diagnosis not present

## 2023-12-27 DIAGNOSIS — N186 End stage renal disease: Secondary | ICD-10-CM | POA: Diagnosis not present

## 2023-12-27 DIAGNOSIS — N2581 Secondary hyperparathyroidism of renal origin: Secondary | ICD-10-CM | POA: Diagnosis not present

## 2023-12-29 DIAGNOSIS — Z992 Dependence on renal dialysis: Secondary | ICD-10-CM | POA: Diagnosis not present

## 2023-12-29 DIAGNOSIS — N2581 Secondary hyperparathyroidism of renal origin: Secondary | ICD-10-CM | POA: Diagnosis not present

## 2023-12-29 DIAGNOSIS — N186 End stage renal disease: Secondary | ICD-10-CM | POA: Diagnosis not present

## 2023-12-31 DIAGNOSIS — N186 End stage renal disease: Secondary | ICD-10-CM | POA: Diagnosis not present

## 2023-12-31 DIAGNOSIS — Z992 Dependence on renal dialysis: Secondary | ICD-10-CM | POA: Diagnosis not present

## 2023-12-31 DIAGNOSIS — N2581 Secondary hyperparathyroidism of renal origin: Secondary | ICD-10-CM | POA: Diagnosis not present

## 2024-01-02 DIAGNOSIS — N2581 Secondary hyperparathyroidism of renal origin: Secondary | ICD-10-CM | POA: Diagnosis not present

## 2024-01-02 DIAGNOSIS — N186 End stage renal disease: Secondary | ICD-10-CM | POA: Diagnosis not present

## 2024-01-02 DIAGNOSIS — R52 Pain, unspecified: Secondary | ICD-10-CM | POA: Diagnosis not present

## 2024-01-02 DIAGNOSIS — Z992 Dependence on renal dialysis: Secondary | ICD-10-CM | POA: Diagnosis not present

## 2024-01-04 DIAGNOSIS — N2581 Secondary hyperparathyroidism of renal origin: Secondary | ICD-10-CM | POA: Diagnosis not present

## 2024-01-04 DIAGNOSIS — R52 Pain, unspecified: Secondary | ICD-10-CM | POA: Diagnosis not present

## 2024-01-04 DIAGNOSIS — N186 End stage renal disease: Secondary | ICD-10-CM | POA: Diagnosis not present

## 2024-01-04 DIAGNOSIS — Z992 Dependence on renal dialysis: Secondary | ICD-10-CM | POA: Diagnosis not present

## 2024-01-11 DIAGNOSIS — E1122 Type 2 diabetes mellitus with diabetic chronic kidney disease: Secondary | ICD-10-CM | POA: Diagnosis not present

## 2024-01-11 DIAGNOSIS — N186 End stage renal disease: Secondary | ICD-10-CM | POA: Diagnosis not present

## 2024-01-11 DIAGNOSIS — Z992 Dependence on renal dialysis: Secondary | ICD-10-CM | POA: Diagnosis not present

## 2024-01-16 ENCOUNTER — Other Ambulatory Visit: Payer: Self-pay | Admitting: *Deleted

## 2024-01-16 DIAGNOSIS — F17210 Nicotine dependence, cigarettes, uncomplicated: Secondary | ICD-10-CM

## 2024-01-16 DIAGNOSIS — J4489 Other specified chronic obstructive pulmonary disease: Secondary | ICD-10-CM

## 2024-01-18 ENCOUNTER — Encounter (HOSPITAL_COMMUNITY): Payer: Self-pay

## 2024-01-18 ENCOUNTER — Encounter: Payer: Self-pay | Admitting: Internal Medicine

## 2024-01-18 ENCOUNTER — Ambulatory Visit: Admitting: *Deleted

## 2024-01-18 ENCOUNTER — Ambulatory Visit: Admitting: Internal Medicine

## 2024-01-18 VITALS — BP 132/72 | HR 64 | Ht 63.5 in | Wt 199.0 lb

## 2024-01-18 DIAGNOSIS — J4489 Other specified chronic obstructive pulmonary disease: Secondary | ICD-10-CM

## 2024-01-18 DIAGNOSIS — F17210 Nicotine dependence, cigarettes, uncomplicated: Secondary | ICD-10-CM

## 2024-01-18 DIAGNOSIS — I10 Essential (primary) hypertension: Secondary | ICD-10-CM

## 2024-01-18 NOTE — Assessment & Plan Note (Addendum)
 She appears well, in no apparent distress.  Alert and oriented times three, pleasant and cooperative. Vital signs are as documented in vital signs section. Controlled on bisoprolol  and f/u per PCP planned  Each maintenance medication was reviewed in detail including emphasizing most importantly the difference between maintenance and prns and under what circumstances the prns are to be triggered using an action plan format where appropriate.  Total time for H and P, chart review, counseling, reviewing hfa  device(s) and generating customized AVS unique to this office visit / same day charting = 34 min summary f/u ov

## 2024-01-18 NOTE — Assessment & Plan Note (Addendum)
 Active smoker - Try off high dose coreg  11/11/2023  - 11/11/2023  After extensive coaching inhaler device,  effectiveness =    75% hfa  >>> try breztri  sample then maint on symbicort  80 2bid pending pfts > pt d/c'd all inhalers  - PFT's  01/18/2024   FEV1 1.43 (57 % ) ratio 0.77  p 0 % improvement from saba p nothing  prior to study with DLCO  16.51 (83%)   and FV curve nl  and ERV 11% = at wt 199    EFFECTS of wt gain on diaphragm function reviewed in detail > main challenge here is sustained wt loss   Pulmonary f/u cn be prn

## 2024-01-18 NOTE — Progress Notes (Signed)
 "  [name], female    DOB: 1960-09-30   MRN: [id number]   Brief patient profile:  68 yowf   active smoker  on HD x 2023  referred to pulmonary clinic 11/11/2023 by renal doc for  cough/ doe        History of Present Illness  11/11/2023  Pulmonary/ 1st office eval/[place] [facility]  Chief Complaint  Patient presents with   Cough    Patient coughs up clear sputum sometimes. Started 6 months ago   Consult   Dyspnea:  [employer] [facility] [employer] [facility] parking Cough: 2022 (6 m p started HD which corresponds to when started smoking again)  Each am 5 min p stirring  sproradic all day,sometimes worse with laughing / one urinary cont/ and immediately when lie down x 5 min > mucoid min vol then resolves Sleep: flat bed 2  pillows   SABA use: none  02 ldz:wnwz  Patient Instructions  Plan A = Automatic = Always=    Breztri  (symbicort /breyna ) Take 2 puffs first thing in am and then another 2 puffs about 12 hours later.   Work on inhaler technique:   The key is to stop smoking completely before smoking completely stops you! Stop corevidol and start Bisoprolol  10 mg twice daily in its place  Please schedule a follow up office visit in 8  weeks, sooner if needed  with PFTs same day       01/18/2024  f/u ov/[name] re: AB   maint on no resp meds  still smoker  needs LDSCT > scheduled   Chief Complaint  Patient presents with   Medical Management of Chronic Issues   Asthma    PFT done today. Having some increased SOB today- relates to missing her dialysis appt due to GI symptoms.    Dyspnea:  works part time at [employer] same pace as others most days  Cough: smoker's rattle  Sleeping: flat bed 2 pillows s  resp cc  SABA use: hfa saba not using  02: none   Lung cancer screening :  referred     No obvious day to day or daytime variability or assoc excess/ purulent sputum or mucus plugs or hemoptysis or cp or chest tightness, subjective wheeze or overt sinus or hb symptoms.    Also denies any obvious  fluctuation of symptoms with weather or environmental changes or other aggravating or alleviating factors except as outlined above   No unusual exposure hx or h/o childhood pna/ asthma or knowledge of premature birth.  Current Allergies, Complete Past Medical History, Past Surgical History, Family History, and Social History were reviewed in [employer] record.  ROS  The following are not active complaints unless bolded Hoarseness, sore throat, dysphagia, dental problems, itching, sneezing,  nasal congestion or discharge of excess mucus or purulent secretions, ear ache,   fever, chills, sweats, unintended wt loss or wt gain, classically pleuritic or exertional cp,  orthopnea pnd or arm/hand swelling  or leg swelling, presyncope, palpitations, abdominal pain, anorexia, nausea, vomiting, diarrhea  or change in bowel habits or change in bladder habits, change in stools or change in urine, dysuria, hematuria,  rash, arthralgias, visual complaints, headache, numbness, weakness or ataxia or problems with walking or coordination,  change in mood or  memory.          Outpatient Medications Prior to Visit  Medication Sig Dispense Refill   amLODipine  (NORVASC ) 10 MG tablet Take 1 tablet (10 mg total) by  mouth daily. 30 tablet 0   bisoprolol  (ZEBETA ) 10 MG tablet One twice daily 180 tablet 3   hydrALAZINE  (APRESOLINE ) 100 MG tablet Take 100 mg by mouth 2 (two) times daily.     sevelamer  carbonate (RENVELA ) 800 MG tablet Take 1,600 mg by mouth 3 (three) times daily with meals.     No facility-administered medications prior to visit.         Past Medical History:  Diagnosis Date   Angina    CAD (coronary artery disease), non obstructive CAD in 2010    a. 2013 Cath: LAD 60, RCA 45m, 70d; b. 2014 Cath: stable anatomy.   Chronic kidney disease    Stage 5; polycystic kidney disease , elevated creatine   COPD (chronic obstructive pulmonary disease) (HCC)    Diabetes mellitus  without complication (HCC)    a. 12/2014 HbA1c 6.5; b. Rx Glipizide  - not taking.   Dysrhythmia    Family history of premature CAD    GERD (gastroesophageal reflux disease)    Hx of adenomatous colonic polyps 06/11/2016   Hyperlipidemia    Hypertension    Hypertensive heart disease    Palpitations    Reflux    Sleep apnea    mild, no c-pap at this point   Tobacco abuse       Objective:    Wt Readings from Last 3 Encounters:  01/18/24 199 lb (90.3 kg)  11/28/23 192 lb (87.1 kg)  11/11/23 190 lb (86.2 kg)      Vital signs reviewed  01/18/2024  - Note at rest 02 sats  97% on RA   General appearance:    amb mod obese (by BMI) wf nad    HEENT : Oropharynx  clear      Nasal turbinates nl    NECK :  without  apparent JVD/ palpable Nodes/TM    LUNGS: no acc muscle use,  Nl contour chest which is clear to A and P bilaterally without cough on insp or exp maneuvers   CV:  RRR  no s3 or murmur or increase in P2, and no edema   ABD:  quite obese soft and nontender   MS:  Gait nl   ext warm without deformities Or obvious joint restrictions  calf tenderness, cyanosis or clubbing    SKIN: warm and dry without lesions    NEURO:  alert, approp, nl sensorium with  no motor or cerebellar deficits apparent.             Assessment     Assessment & Plan Cigarette smoker Counseled re importance of smoking cessation but did not meet time criteria for separate billing     Asthmatic bronchitis , chronic (HCC) Active smoker - Try off high dose coreg  11/11/2023  - 11/11/2023  After extensive coaching inhaler device,  effectiveness =    75% hfa  >>> try breztri  sample then maint on symbicort  80 2bid pending pfts > pt d/c'd all inhalers  - PFT's  01/18/2024   FEV1 1.43 (57 % ) ratio 0.77  p 0 % improvement from saba p nothing  prior to study with DLCO  16.51 (83%)   and FV curve nl  and ERV 11% = at wt 199    EFFECTS of wt gain on diaphragm function reviewed in detail > main  challenge here is sustained wt loss   Pulmonary f/u cn be prn  Essential hypertension She appears well, in no apparent distress.  Alert and oriented times  three, pleasant and cooperative. Vital signs are as documented in vital signs section. Controlled on bisoprolol  and f/u per PCP planned  Each maintenance medication was reviewed in detail including emphasizing most importantly the difference between maintenance and prns and under what circumstances the prns are to be triggered using an action plan format where appropriate.  Total time for H and P, chart review, counseling, reviewing hfa  device(s) and generating customized AVS unique to this office visit / same day charting = 34 min summary f/u ov          AVS  Patient Instructions  The key is to stop smoking completely before smoking completely stops you!   My office will be contacting you by phone for referral to lung cancer screening   [phone]) - if you don't hear back from my office within one week,  please call us  back or notify us  thru MyChart and we'll address it right away.     If you are satisfied with your treatment plan,  let your doctor know and he/she can either refill your medications or you can return here when your prescription runs out.     If in any way you are not 100% satisfied,  please tell us .  If 100% better, tell your friends!  Pulmonary follow up is as needed            Ozell America, MD 01/18/2024        "

## 2024-01-18 NOTE — Progress Notes (Signed)
 Full PFT performed today.

## 2024-01-18 NOTE — Assessment & Plan Note (Addendum)
 Counseled re importance of smoking cessation but did not meet time criteria for separate billing

## 2024-01-18 NOTE — Patient Instructions (Addendum)
 The key is to stop smoking completely before smoking completely stops you!   My office will be contacting you by phone for referral to lung cancer screening   [phone]) - if you don't hear back from my office within one week,  please call us  back or notify us  thru MyChart and we'll address it right away.     If you are satisfied with your treatment plan,  let your doctor know and he/she can either refill your medications or you can return here when your prescription runs out.     If in any way you are not 100% satisfied,  please tell us .  If 100% better, tell your friends!  Pulmonary follow up is as needed

## 2024-01-18 NOTE — Patient Instructions (Signed)
 Full PFT performed today.

## 2024-01-22 LAB — PULMONARY FUNCTION TEST
DL/VA % pred: 98 %
DL/VA: 4.15 ml/min/mmHg/L
DLCO cor % pred: 83 %
DLCO cor: 16.51 ml/min/mmHg
DLCO unc % pred: 83 %
DLCO unc: 16.51 ml/min/mmHg
FEF 25-75 Post: 1.05 L/s
FEF 25-75 Pre: 1.06 L/s
FEF2575-%Change-Post: -1 %
FEF2575-%Pred-Post: 45 %
FEF2575-%Pred-Pre: 46 %
FEV1-%Change-Post: 0 %
FEV1-%Pred-Post: 57 %
FEV1-%Pred-Pre: 57 %
FEV1-Post: 1.43 L
FEV1-Pre: 1.41 L
FEV1FVC-%Change-Post: 5 %
FEV1FVC-%Pred-Pre: 93 %
FEV6-%Change-Post: -4 %
FEV6-%Pred-Post: 60 %
FEV6-%Pred-Pre: 62 %
FEV6-Post: 1.86 L
FEV6-Pre: 1.95 L
FEV6FVC-%Pred-Post: 103 %
FEV6FVC-%Pred-Pre: 103 %
FVC-%Change-Post: -4 %
FVC-%Pred-Post: 57 %
FVC-%Pred-Pre: 60 %
FVC-Post: 1.86 L
FVC-Pre: 1.95 L
Post FEV1/FVC ratio: 77 %
Post FEV6/FVC ratio: 100 %
Pre FEV1/FVC ratio: 73 %
Pre FEV6/FVC Ratio: 100 %
RV % pred: 136 %
RV: 2.71 L
TLC % pred: 95 %
TLC: 4.76 L

## 2024-01-23 ENCOUNTER — Ambulatory Visit: Payer: Self-pay | Admitting: Internal Medicine

## 2024-01-24 ENCOUNTER — Encounter (HOSPITAL_COMMUNITY): Payer: Self-pay

## 2024-01-25 ENCOUNTER — Telehealth: Payer: Self-pay | Admitting: Acute Care

## 2024-01-25 DIAGNOSIS — Z122 Encounter for screening for malignant neoplasm of respiratory organs: Secondary | ICD-10-CM

## 2024-01-25 DIAGNOSIS — Z87891 Personal history of nicotine dependence: Secondary | ICD-10-CM

## 2024-01-25 DIAGNOSIS — F17210 Nicotine dependence, cigarettes, uncomplicated: Secondary | ICD-10-CM

## 2024-01-25 NOTE — Telephone Encounter (Signed)
 Lung Cancer Screening Narrative/Criteria Questionnaire (Cigarette Smokers Only- No Cigars/Pipes/vapes)   EMER ONNEN   SDMV:02/06/24@1030a /[place]                                           07/08/1960               LDCT: 02/08/24@3p / [address]    64 y.o.   Phone: [phone]  Lung Screening Narrative (confirm age 55-77 yrs Medicare / 50-80 yrs Private pay insurance)   Insurance information:[employer]   Referring Provider:Wert    This screening involves an initial phone call with a team member from our program. It is called a shared decision making visit. The initial meeting is required by insurance and Medicare to make sure you understand the program. This appointment takes about 15-20 minutes to complete. The CT scan will completed at a separate date/time. This scan takes about 5-10 minutes to complete and you may eat and drink before and after the scan.  Criteria questions for Lung Cancer Screening:   Are you a current or former smoker? Current Age began smoking: 21y    If you are a former smoker, what year did you quit smoking? Current but quit once for 1.5 yrs   To calculate your smoking history, I need an accurate estimate of how many packs of cigarettes you smoked per day and for how many years. (Not just the number of PPD you are now smoking)   Years smoking 41 x Packs per day 1 = Pack years 41   (at least 20 pack yrs)   (Make sure they understand that we need to know how much they have smoked in the past, not just the number of PPD they are smoking now)  Do you have a personal history of cancer?  No    Do you have a family history of cancer? No  Are you coughing up blood?  No  Have you had unexplained weight loss of 15 lbs or more in the last 6 months? No  It looks like you meet all criteria.     Additional information: N/A

## 2024-01-27 ENCOUNTER — Encounter (HOSPITAL_COMMUNITY): Payer: Self-pay

## 2024-02-02 ENCOUNTER — Ambulatory Visit

## 2024-02-02 DIAGNOSIS — Z720 Tobacco use: Secondary | ICD-10-CM

## 2024-02-02 DIAGNOSIS — Z122 Encounter for screening for malignant neoplasm of respiratory organs: Secondary | ICD-10-CM

## 2024-02-02 NOTE — Patient Instructions (Signed)
 Thank you for participating in the [facility]. It was our pleasure to meet you today. We will call you with the results of your scan within the next few days. Your scan will be assigned a Lung RADS category score by the physicians reading the scans.  This Lung RADS score determines follow up scanning.  See below for description of categories, and follow up screening recommendations. We will be in touch to schedule your follow up screening annually or based on recommendations of our providers. We will fax a copy of your scan results to your Primary Care Physician, or the physician who referred you to the program, to ensure they have the results. Please call the office if you have any questions or concerns regarding your scanning experience or results.  Our office number is [phone]. Please speak with Karna Curly, RN., Karna Doom RN, or [facility] RN, and Isaiah Dover RN. They are  our Lung Cancer Screening RN.'s If They are unavailable when you call, Please leave a message on the voice mail. We will return your call at our earliest convenience.This voice mail is monitored several times a day.  Remember, if your scan is normal, we will scan you annually as long as you continue to meet the criteria for the program. (Age 26-80, Current smoker or smoker who has quit within the last 15 years). If you are a smoker, remember, quitting is the single most powerful action that you can take to decrease your risk of lung cancer and other pulmonary, breathing related problems. We know quitting is hard, and we are here to help.  Please let us  know if there is anything we can do to help you meet your goal of quitting. If you are a former smoker, counselling psychologist. We are proud of you! Remain smoke free! Remember you can refer friends or family members through the number above.  We will screen them to make sure they meet criteria for the program. Thank you for helping us   take better care of you by participating in Lung Screening.  Lung RADS Categories:  Lung RADS 1: no nodules or definitely non-concerning nodules.  Recommendation is for a repeat annual scan in 12 months.  Lung RADS 2:  nodules that are non-concerning in appearance and behavior with a very low likelihood of becoming an active cancer. Recommendation is for a repeat annual scan in 12 months.  Lung RADS 3: nodules that are probably non-concerning , includes nodules with a low likelihood of becoming an active cancer.  Recommendation is for a 13-month repeat screening scan. Often noted after an upper respiratory illness. We will be in touch to make sure you have no questions, and to schedule your 36-month scan.  Lung RADS 4 A: nodules with concerning findings, recommendation is most often for a follow up scan in 3 months or additional testing based on our provider's assessment of the scan. We will be in touch to make sure you have no questions and to schedule the recommended 3 month follow up scan.  Lung RADS 4 B:  indicates findings that are concerning. We will be in touch with you to schedule additional diagnostic testing based on our provider's  assessment of the scan.  Smoking Cessation  Tips to Quit:  Pick a Quit Day within the next week.  Remove temptations: toss cigarettes, lighters, ashtrays.  Tell someone you trust for support.  Avoid triggers like stress, boredom, or being around smokers.  Use healthy replacements: water,  gum, walking, deep breaths.  Stay busy with hobbies, music, drawing, or exercise.  Be patient with yourself--slipping once doesnt mean failure.   [facility] Smoking/Nicotine  Cessation Resources  If youre ready to quit TODAY, our virtual care team is available to start your journey to a tobacco free life.  Appointments are available from 8 a.m. to 8 p.m. Monday to Friday. Most health insurance plans will cover some level of tobacco cessation visits and medications.   To make a virtual appointment and access other resources, follow the link: severties.nl   [facility] (1-800-QUITNOW) [facility] offers free combination nicotine  replacement therapy (patches plus either gum or lozenges). When used along with counseling, this will more than double your chances of quitting successfully.  Call 1-800-QUIT NOW or Text: Ready to 34191.   Spanish language portal: 1-855-DEJELO-YA (785)788-7630).  TTY: 606-701-6078 American [place] Quitline: Call 888-7AI-QUIT 725-482-3533) http://carroll-castaneda.info/  The American Lung Association offers Freedom From Smoking Programs Self guided or group programs offered, check their website for free virtual programs available to [facility] residents Lung Helpline at 1-800-LUNGUSA  http://keith.biz/  [employer] Offers tools and tips to quit smoking.  Free quitSTART app:   Monitor progress, manage cravings, access tools, and more with the app.  portablegrid.se  Hypnosis for smoking cessation  [employer] (845)748-7473  Acupuncture for smoking cessation  [employer] [facility] [phone]    Please let us  know how we can support you as you quit smoking. I know this is hard, but you can do this!    Your CT scan is scheduled for 02/08/24 at 3 pm at [facility].   Stay safe this weekend!

## 2024-02-02 NOTE — Progress Notes (Signed)
 Virtual Visit via Telephone Note  I connected with [name] on 02/02/24 at  3:00 PM EST by telephone and verified that I am speaking with the correct person using two identifiers.  Location: Patient: At home, in [place]  Provider: [address] [place], [place], [address]    I discussed the limitations, risks, security and privacy concerns of performing an evaluation and management service by telephone and the availability of in person appointments. I also discussed with the patient that there may be a patient responsible charge related to this service. The patient expressed understanding and agreed to proceed.  Shared Decision Making Visit Lung Cancer Screening Program [phone])   Eligibility: Age 64 y.o. Pack Years Smoking History Calculation 41 pack years  (# packs/per year x # years smoked) Recent History of coughing up blood  no Unexplained weight loss? no ( >Than 15 pounds within the last 6 months ) Prior History Lung / other cancer no (Diagnosis within the last 5 years already requiring surveillance chest CT Scans). Smoking Status Current Smoker Former Smokers: Years since quit: N/A  Quit Date: N/A  Visit Components: Discussion included one or more decision making aids. yes Discussion included risk/benefits of screening. yes Discussion included potential follow up diagnostic testing for abnormal scans. yes Discussion included meaning and risk of over diagnosis. yes Discussion included meaning and risk of False Positives. yes Discussion included meaning of total radiation exposure. yes  Counseling Included: Importance of adherence to annual lung cancer LDCT screening. yes Impact of comorbidities on ability to participate in the program. yes Ability and willingness to under diagnostic treatment. yes  Smoking Cessation Counseling: Current Smokers:  Discussed importance of smoking cessation. yes Information about tobacco cessation classes and interventions  provided to patient. yes Patient provided with ticket for LDCT Scan. N/A Symptomatic Patient. yes  Counseling(Intermediate counseling: > three minutes) 99406 Diagnosis Code: Tobacco Use Z72.0 Asymptomatic Patient no  Counseling  Former Smokers:  Discussed the importance of maintaining cigarette abstinence. N/A Diagnosis Code: Personal History of Nicotine  Dependence. S12.108 Information about tobacco cessation classes and interventions provided to patient. Yes Patient provided with ticket for LDCT Scan. N/A Written Order for Lung Cancer Screening with LDCT placed in Epic. Yes (CT Chest Lung Cancer Screening Low Dose W/O CM) [id number] Z12.2-Screening of respiratory organs Z87.891-Personal history of nicotine  dependence  [name] is a current user of tobacco or nicotine  products. She is ready to quit at this time. She is interested in trying nicotine  patches/gum/lozenges and speaking with a tobacco cessation counselor. Ambulatory referral placed to Virtual Care Smoking Cessation provider. Counseling provided today addressed the risks of continued use and the benefits of cessation. Discussed tobacco/nicotine  use history, readiness to quit, and evidence-based treatment options including behavioral strategies, support resources, and pharmacologic therapies. Provided encouragement and educational materials on steps and resources to quit smoking. Patient questions were addressed, and follow-up recommended for continued support. Total time spent on counseling: 3 minutes.   Ambulatory Referral to Virtual Care Smoking Cessation ordered.   Wells CHRISTELLA [place], FNP

## 2024-02-03 ENCOUNTER — Encounter (HOSPITAL_COMMUNITY): Payer: Self-pay

## 2024-02-06 ENCOUNTER — Encounter: Admitting: Adult Health

## 2024-02-08 ENCOUNTER — Inpatient Hospital Stay: Admission: RE | Admit: 2024-02-08 | Source: Ambulatory Visit

## 2024-02-10 LAB — HM MAMMOGRAPHY

## 2024-02-14 ENCOUNTER — Encounter: Payer: Self-pay | Admitting: Family

## 2024-02-14 ENCOUNTER — Ambulatory Visit: Payer: Self-pay | Admitting: Family

## 2024-02-20 ENCOUNTER — Other Ambulatory Visit
